# Patient Record
Sex: Female | Born: 1940 | Race: White | Hispanic: No | Smoking: Never smoker
Health system: Southern US, Community
[De-identification: ages and names within clinical notes are randomized; demographics above are authoritative.]

## PROBLEM LIST (undated history)

## (undated) DIAGNOSIS — M069 Rheumatoid arthritis, unspecified: Secondary | ICD-10-CM

## (undated) DIAGNOSIS — K5909 Other constipation: Secondary | ICD-10-CM

## (undated) DIAGNOSIS — Z8719 Personal history of other diseases of the digestive system: Secondary | ICD-10-CM

## (undated) DIAGNOSIS — I4891 Unspecified atrial fibrillation: Secondary | ICD-10-CM

## (undated) DIAGNOSIS — K219 Gastro-esophageal reflux disease without esophagitis: Secondary | ICD-10-CM

## (undated) DIAGNOSIS — K52832 Lymphocytic colitis: Secondary | ICD-10-CM

## (undated) DIAGNOSIS — T7840XA Allergy, unspecified, initial encounter: Secondary | ICD-10-CM

## (undated) DIAGNOSIS — Z973 Presence of spectacles and contact lenses: Secondary | ICD-10-CM

## (undated) DIAGNOSIS — R197 Diarrhea, unspecified: Secondary | ICD-10-CM

## (undated) DIAGNOSIS — D649 Anemia, unspecified: Secondary | ICD-10-CM

## (undated) DIAGNOSIS — E785 Hyperlipidemia, unspecified: Secondary | ICD-10-CM

## (undated) DIAGNOSIS — H269 Unspecified cataract: Secondary | ICD-10-CM

## (undated) DIAGNOSIS — Z9889 Other specified postprocedural states: Secondary | ICD-10-CM

## (undated) HISTORY — DX: Hyperlipidemia, unspecified: E78.5

## (undated) HISTORY — DX: Lymphocytic colitis: K52.832

## (undated) HISTORY — DX: Other specified postprocedural states: Z98.890

## (undated) HISTORY — DX: Rheumatoid arthritis, unspecified: M06.9

## (undated) HISTORY — DX: Allergy, unspecified, initial encounter: T78.40XA

## (undated) HISTORY — DX: Diarrhea, unspecified: R19.7

## (undated) HISTORY — DX: Personal history of other diseases of the digestive system: Z87.19

## (undated) HISTORY — PX: COLONOSCOPY: SHX174

## (undated) HISTORY — DX: Unspecified cataract: H26.9

## (undated) HISTORY — PX: UPPER GASTROINTESTINAL ENDOSCOPY: SHX188

## (undated) HISTORY — PX: TUBAL LIGATION: SHX77

## (undated) HISTORY — DX: Anemia, unspecified: D64.9

## (undated) HISTORY — PX: CATARACT EXTRACTION: SUR2

## (undated) HISTORY — PX: TONSILLECTOMY: SUR1361

## (undated) HISTORY — DX: Unspecified atrial fibrillation: I48.91

## (undated) MED FILL — Finasteride Tab 5 MG: ORAL | Fill #0 | Status: CN

---

## 1997-05-31 HISTORY — PX: ESOPHAGEAL DILATION: SHX303

## 1997-09-21 ENCOUNTER — Encounter: Admission: RE | Admit: 1997-09-21 | Discharge: 1997-09-21 | Payer: Self-pay | Admitting: Family Medicine

## 1997-10-26 ENCOUNTER — Encounter: Admission: RE | Admit: 1997-10-26 | Discharge: 1997-10-26 | Payer: Self-pay | Admitting: Family Medicine

## 1997-11-29 ENCOUNTER — Encounter: Admission: RE | Admit: 1997-11-29 | Discharge: 1997-11-29 | Payer: Self-pay | Admitting: Family Medicine

## 1997-12-24 ENCOUNTER — Encounter: Admission: RE | Admit: 1997-12-24 | Discharge: 1997-12-24 | Payer: Self-pay | Admitting: Family Medicine

## 1998-01-04 ENCOUNTER — Encounter: Admission: RE | Admit: 1998-01-04 | Discharge: 1998-01-04 | Payer: Self-pay | Admitting: Sports Medicine

## 1998-02-25 ENCOUNTER — Encounter: Admission: RE | Admit: 1998-02-25 | Discharge: 1998-02-25 | Payer: Self-pay | Admitting: Family Medicine

## 1998-03-01 ENCOUNTER — Encounter: Admission: RE | Admit: 1998-03-01 | Discharge: 1998-03-01 | Payer: Self-pay | Admitting: Family Medicine

## 1998-03-01 ENCOUNTER — Other Ambulatory Visit: Admission: RE | Admit: 1998-03-01 | Discharge: 1998-03-01 | Payer: Self-pay | Admitting: Family Medicine

## 1998-06-13 ENCOUNTER — Encounter: Admission: RE | Admit: 1998-06-13 | Discharge: 1998-06-13 | Payer: Self-pay | Admitting: Sports Medicine

## 1998-07-13 ENCOUNTER — Encounter: Admission: RE | Admit: 1998-07-13 | Discharge: 1998-07-13 | Payer: Self-pay | Admitting: Family Medicine

## 1998-10-07 ENCOUNTER — Encounter: Admission: RE | Admit: 1998-10-07 | Discharge: 1998-10-07 | Payer: Self-pay | Admitting: Family Medicine

## 1998-11-07 ENCOUNTER — Encounter: Admission: RE | Admit: 1998-11-07 | Discharge: 1998-11-07 | Payer: Self-pay | Admitting: Family Medicine

## 1998-11-21 ENCOUNTER — Encounter: Admission: RE | Admit: 1998-11-21 | Discharge: 1998-11-21 | Payer: Self-pay | Admitting: Family Medicine

## 1999-02-27 ENCOUNTER — Encounter: Admission: RE | Admit: 1999-02-27 | Discharge: 1999-02-27 | Payer: Self-pay | Admitting: Sports Medicine

## 1999-05-30 ENCOUNTER — Encounter: Admission: RE | Admit: 1999-05-30 | Discharge: 1999-05-30 | Payer: Self-pay | Admitting: Family Medicine

## 1999-06-09 ENCOUNTER — Encounter: Admission: RE | Admit: 1999-06-09 | Discharge: 1999-06-09 | Payer: Self-pay | Admitting: Family Medicine

## 1999-08-18 ENCOUNTER — Encounter: Payer: Self-pay | Admitting: Sports Medicine

## 1999-08-18 ENCOUNTER — Encounter: Admission: RE | Admit: 1999-08-18 | Discharge: 1999-08-18 | Payer: Self-pay | Admitting: Gastroenterology

## 1999-10-13 ENCOUNTER — Encounter: Admission: RE | Admit: 1999-10-13 | Discharge: 1999-10-13 | Payer: Self-pay | Admitting: Sports Medicine

## 1999-11-17 ENCOUNTER — Encounter: Admission: RE | Admit: 1999-11-17 | Discharge: 1999-11-17 | Payer: Self-pay | Admitting: Sports Medicine

## 2000-01-26 ENCOUNTER — Encounter: Admission: RE | Admit: 2000-01-26 | Discharge: 2000-01-26 | Payer: Self-pay | Admitting: Family Medicine

## 2000-03-06 ENCOUNTER — Encounter: Admission: RE | Admit: 2000-03-06 | Discharge: 2000-03-06 | Payer: Self-pay | Admitting: Family Medicine

## 2000-05-20 ENCOUNTER — Encounter: Admission: RE | Admit: 2000-05-20 | Discharge: 2000-05-20 | Payer: Self-pay | Admitting: Family Medicine

## 2000-06-11 ENCOUNTER — Encounter: Admission: RE | Admit: 2000-06-11 | Discharge: 2000-06-11 | Payer: Self-pay | Admitting: Family Medicine

## 2000-10-08 ENCOUNTER — Encounter: Admission: RE | Admit: 2000-10-08 | Discharge: 2000-10-08 | Payer: Self-pay | Admitting: Sports Medicine

## 2000-10-08 ENCOUNTER — Encounter: Payer: Self-pay | Admitting: Sports Medicine

## 2000-11-19 ENCOUNTER — Encounter: Admission: RE | Admit: 2000-11-19 | Discharge: 2000-11-19 | Payer: Self-pay | Admitting: Family Medicine

## 2001-09-30 ENCOUNTER — Encounter: Admission: RE | Admit: 2001-09-30 | Discharge: 2001-09-30 | Payer: Self-pay | Admitting: Family Medicine

## 2001-10-03 ENCOUNTER — Encounter: Admission: RE | Admit: 2001-10-03 | Discharge: 2001-10-03 | Payer: Self-pay | Admitting: Sports Medicine

## 2001-10-21 ENCOUNTER — Encounter: Admission: RE | Admit: 2001-10-21 | Discharge: 2001-10-21 | Payer: Self-pay | Admitting: Family Medicine

## 2001-10-22 ENCOUNTER — Encounter: Admission: RE | Admit: 2001-10-22 | Discharge: 2001-10-22 | Payer: Self-pay | Admitting: Family Medicine

## 2001-12-08 ENCOUNTER — Encounter: Payer: Self-pay | Admitting: Sports Medicine

## 2001-12-08 ENCOUNTER — Encounter: Admission: RE | Admit: 2001-12-08 | Discharge: 2001-12-08 | Payer: Self-pay | Admitting: Sports Medicine

## 2002-02-09 ENCOUNTER — Encounter: Admission: RE | Admit: 2002-02-09 | Discharge: 2002-02-09 | Payer: Self-pay | Admitting: Family Medicine

## 2002-04-28 ENCOUNTER — Encounter: Admission: RE | Admit: 2002-04-28 | Discharge: 2002-04-28 | Payer: Self-pay | Admitting: Sports Medicine

## 2002-10-16 ENCOUNTER — Encounter: Admission: RE | Admit: 2002-10-16 | Discharge: 2002-10-16 | Payer: Self-pay | Admitting: Sports Medicine

## 2002-11-11 ENCOUNTER — Ambulatory Visit (HOSPITAL_COMMUNITY): Admission: RE | Admit: 2002-11-11 | Discharge: 2002-11-11 | Payer: Self-pay | Admitting: Gastroenterology

## 2003-02-16 ENCOUNTER — Encounter: Payer: Self-pay | Admitting: Family Medicine

## 2003-02-16 ENCOUNTER — Ambulatory Visit (HOSPITAL_COMMUNITY): Admission: RE | Admit: 2003-02-16 | Discharge: 2003-02-16 | Payer: Self-pay | Admitting: Family Medicine

## 2003-06-29 ENCOUNTER — Encounter: Admission: RE | Admit: 2003-06-29 | Discharge: 2003-06-29 | Payer: Self-pay | Admitting: Family Medicine

## 2003-06-29 ENCOUNTER — Encounter (INDEPENDENT_AMBULATORY_CARE_PROVIDER_SITE_OTHER): Payer: Self-pay | Admitting: *Deleted

## 2003-06-29 ENCOUNTER — Ambulatory Visit (HOSPITAL_COMMUNITY): Admission: RE | Admit: 2003-06-29 | Discharge: 2003-06-29 | Payer: Self-pay | Admitting: Family Medicine

## 2003-09-22 ENCOUNTER — Encounter: Admission: RE | Admit: 2003-09-22 | Discharge: 2003-09-22 | Payer: Self-pay | Admitting: Sports Medicine

## 2003-11-11 ENCOUNTER — Encounter: Admission: RE | Admit: 2003-11-11 | Discharge: 2003-11-11 | Payer: Self-pay | Admitting: Sports Medicine

## 2004-02-15 ENCOUNTER — Ambulatory Visit: Payer: Self-pay | Admitting: Family Medicine

## 2004-03-07 ENCOUNTER — Ambulatory Visit (HOSPITAL_COMMUNITY): Admission: RE | Admit: 2004-03-07 | Discharge: 2004-03-07 | Payer: Self-pay | Admitting: Sports Medicine

## 2004-06-12 ENCOUNTER — Ambulatory Visit: Payer: Self-pay | Admitting: Family Medicine

## 2004-06-14 ENCOUNTER — Ambulatory Visit: Payer: Self-pay | Admitting: Family Medicine

## 2004-07-14 ENCOUNTER — Ambulatory Visit: Payer: Self-pay | Admitting: Family Medicine

## 2004-08-15 ENCOUNTER — Ambulatory Visit: Payer: Self-pay | Admitting: Family Medicine

## 2004-08-21 ENCOUNTER — Encounter: Admission: RE | Admit: 2004-08-21 | Discharge: 2004-08-21 | Payer: Self-pay | Admitting: Family Medicine

## 2005-01-23 ENCOUNTER — Ambulatory Visit: Payer: Self-pay | Admitting: Sports Medicine

## 2005-01-26 ENCOUNTER — Ambulatory Visit: Payer: Self-pay | Admitting: Sports Medicine

## 2005-02-16 ENCOUNTER — Ambulatory Visit: Payer: Self-pay | Admitting: Family Medicine

## 2005-03-13 ENCOUNTER — Ambulatory Visit (HOSPITAL_COMMUNITY): Admission: RE | Admit: 2005-03-13 | Discharge: 2005-03-13 | Payer: Self-pay | Admitting: Family Medicine

## 2005-03-20 ENCOUNTER — Ambulatory Visit: Payer: Self-pay | Admitting: Family Medicine

## 2005-09-25 ENCOUNTER — Ambulatory Visit: Payer: Self-pay | Admitting: Family Medicine

## 2005-09-25 ENCOUNTER — Encounter: Payer: Self-pay | Admitting: Family Medicine

## 2005-09-28 ENCOUNTER — Encounter (INDEPENDENT_AMBULATORY_CARE_PROVIDER_SITE_OTHER): Payer: Self-pay | Admitting: *Deleted

## 2005-09-28 LAB — CONVERTED CEMR LAB

## 2005-10-02 ENCOUNTER — Ambulatory Visit: Payer: Self-pay | Admitting: Family Medicine

## 2006-02-08 ENCOUNTER — Ambulatory Visit: Payer: Self-pay | Admitting: Family Medicine

## 2006-03-27 ENCOUNTER — Ambulatory Visit (HOSPITAL_COMMUNITY): Admission: RE | Admit: 2006-03-27 | Discharge: 2006-03-27 | Payer: Self-pay | Admitting: Sports Medicine

## 2006-05-21 ENCOUNTER — Ambulatory Visit: Payer: Self-pay | Admitting: Sports Medicine

## 2006-05-28 ENCOUNTER — Ambulatory Visit: Payer: Self-pay | Admitting: Family Medicine

## 2006-06-06 ENCOUNTER — Ambulatory Visit: Payer: Self-pay | Admitting: Family Medicine

## 2006-06-27 DIAGNOSIS — M1990 Unspecified osteoarthritis, unspecified site: Secondary | ICD-10-CM

## 2006-06-27 DIAGNOSIS — E785 Hyperlipidemia, unspecified: Secondary | ICD-10-CM | POA: Insufficient documentation

## 2006-06-27 DIAGNOSIS — K219 Gastro-esophageal reflux disease without esophagitis: Secondary | ICD-10-CM

## 2006-06-28 ENCOUNTER — Encounter (INDEPENDENT_AMBULATORY_CARE_PROVIDER_SITE_OTHER): Payer: Self-pay | Admitting: *Deleted

## 2006-08-08 ENCOUNTER — Telehealth: Payer: Self-pay | Admitting: *Deleted

## 2006-08-27 ENCOUNTER — Ambulatory Visit: Payer: Self-pay | Admitting: Sports Medicine

## 2006-08-27 DIAGNOSIS — M79609 Pain in unspecified limb: Secondary | ICD-10-CM

## 2006-08-27 DIAGNOSIS — M7742 Metatarsalgia, left foot: Secondary | ICD-10-CM

## 2006-08-27 DIAGNOSIS — M7741 Metatarsalgia, right foot: Secondary | ICD-10-CM

## 2006-08-27 DIAGNOSIS — M722 Plantar fascial fibromatosis: Secondary | ICD-10-CM

## 2006-10-02 ENCOUNTER — Ambulatory Visit: Payer: Self-pay | Admitting: Sports Medicine

## 2006-10-25 ENCOUNTER — Telehealth: Payer: Self-pay | Admitting: Family Medicine

## 2006-11-26 ENCOUNTER — Ambulatory Visit: Payer: Self-pay | Admitting: Family Medicine

## 2006-11-26 DIAGNOSIS — M25539 Pain in unspecified wrist: Secondary | ICD-10-CM

## 2006-11-26 LAB — CONVERTED CEMR LAB
ALT: 11 units/L (ref 0–35)
AST: 14 units/L (ref 0–37)
Albumin: 4.3 g/dL (ref 3.5–5.2)
Alkaline Phosphatase: 56 units/L (ref 39–117)
BUN: 14 mg/dL (ref 6–23)
CO2: 25 meq/L (ref 19–32)
Calcium: 8.9 mg/dL (ref 8.4–10.5)
Chloride: 107 meq/L (ref 96–112)
Cholesterol: 248 mg/dL — ABNORMAL HIGH (ref 0–200)
Creatinine, Ser: 0.64 mg/dL (ref 0.40–1.20)
Glucose, Bld: 93 mg/dL (ref 70–99)
HCT: 42.9 % (ref 36.0–46.0)
HDL: 108 mg/dL (ref >39)
Hemoglobin: 13.7 g/dL (ref 12.0–15.0)
LDL Cholesterol: 129 mg/dL — ABNORMAL HIGH (ref 0–99)
MCHC: 31.9 g/dL (ref 30.0–36.0)
MCV: 102.4 fL — ABNORMAL HIGH (ref 78.0–100.0)
Platelets: 271 10*3/uL (ref 150–400)
Potassium: 4.7 meq/L (ref 3.5–5.3)
RBC: 4.19 M/uL (ref 3.87–5.11)
RDW: 13.8 % (ref 11.5–14.0)
Sodium: 142 meq/L (ref 135–145)
Total Bilirubin: 0.5 mg/dL (ref 0.3–1.2)
Total CHOL/HDL Ratio: 2.3
Total Protein: 6.8 g/dL (ref 6.0–8.3)
Triglycerides: 55 mg/dL (ref <150)
VLDL: 11 mg/dL (ref 0–40)
WBC: 5.1 10*3/uL (ref 4.0–10.5)

## 2006-12-02 ENCOUNTER — Encounter: Payer: Self-pay | Admitting: Family Medicine

## 2007-01-01 ENCOUNTER — Telehealth: Payer: Self-pay | Admitting: Family Medicine

## 2007-02-12 ENCOUNTER — Ambulatory Visit: Payer: Self-pay | Admitting: Family Medicine

## 2007-05-01 HISTORY — PX: DILATION AND CURETTAGE OF UTERUS: SHX78

## 2007-06-03 ENCOUNTER — Ambulatory Visit (HOSPITAL_COMMUNITY): Admission: RE | Admit: 2007-06-03 | Discharge: 2007-06-03 | Payer: Self-pay | Admitting: Family Medicine

## 2007-09-02 ENCOUNTER — Telehealth: Payer: Self-pay | Admitting: Family Medicine

## 2007-09-29 ENCOUNTER — Telehealth: Payer: Self-pay | Admitting: *Deleted

## 2007-09-30 ENCOUNTER — Ambulatory Visit: Payer: Self-pay | Admitting: Family Medicine

## 2007-09-30 ENCOUNTER — Encounter: Payer: Self-pay | Admitting: Family Medicine

## 2007-09-30 ENCOUNTER — Telehealth: Payer: Self-pay | Admitting: Family Medicine

## 2007-09-30 DIAGNOSIS — N950 Postmenopausal bleeding: Secondary | ICD-10-CM

## 2007-11-04 ENCOUNTER — Encounter (INDEPENDENT_AMBULATORY_CARE_PROVIDER_SITE_OTHER): Payer: Self-pay | Admitting: Obstetrics and Gynecology

## 2007-11-04 ENCOUNTER — Ambulatory Visit (HOSPITAL_COMMUNITY): Admission: RE | Admit: 2007-11-04 | Discharge: 2007-11-04 | Payer: Self-pay | Admitting: Obstetrics and Gynecology

## 2007-11-06 ENCOUNTER — Telehealth: Payer: Self-pay | Admitting: *Deleted

## 2007-11-11 ENCOUNTER — Ambulatory Visit: Payer: Self-pay | Admitting: Family Medicine

## 2007-11-11 DIAGNOSIS — Z9189 Other specified personal risk factors, not elsewhere classified: Secondary | ICD-10-CM

## 2007-11-11 LAB — CONVERTED CEMR LAB
ALT: 12 units/L (ref 0–35)
AST: 11 units/L (ref 0–37)
Albumin: 4.3 g/dL (ref 3.5–5.2)
Alkaline Phosphatase: 46 units/L (ref 39–117)
BUN: 11 mg/dL (ref 6–23)
CO2: 24 meq/L (ref 19–32)
Calcium: 9.5 mg/dL (ref 8.4–10.5)
Chloride: 104 meq/L (ref 96–112)
Creatinine, Ser: 0.65 mg/dL (ref 0.40–1.20)
Direct LDL: 149 mg/dL — ABNORMAL HIGH
Glucose, Bld: 89 mg/dL (ref 70–99)
HCT: 44.2 % (ref 36.0–46.0)
Hemoglobin: 14.2 g/dL (ref 12.0–15.0)
MCHC: 32.1 g/dL (ref 30.0–36.0)
MCV: 102.1 fL — ABNORMAL HIGH (ref 78.0–100.0)
Platelets: 277 10*3/uL (ref 150–400)
Potassium: 4.2 meq/L (ref 3.5–5.3)
RBC: 4.33 M/uL (ref 3.87–5.11)
RDW: 13.2 % (ref 11.5–15.5)
Sodium: 141 meq/L (ref 135–145)
Total Bilirubin: 0.5 mg/dL (ref 0.3–1.2)
Total Protein: 7.1 g/dL (ref 6.0–8.3)
Vit D, 1,25-Dihydroxy: 62 (ref 30–89)
WBC: 6.3 10*3/uL (ref 4.0–10.5)

## 2007-11-13 ENCOUNTER — Encounter: Payer: Self-pay | Admitting: Family Medicine

## 2007-11-20 ENCOUNTER — Encounter: Payer: Self-pay | Admitting: Family Medicine

## 2008-01-28 ENCOUNTER — Ambulatory Visit: Payer: Self-pay | Admitting: Family Medicine

## 2008-01-30 ENCOUNTER — Telehealth: Payer: Self-pay | Admitting: *Deleted

## 2008-01-30 ENCOUNTER — Ambulatory Visit: Payer: Self-pay | Admitting: Family Medicine

## 2008-02-05 ENCOUNTER — Ambulatory Visit: Payer: Self-pay | Admitting: Sports Medicine

## 2008-06-07 ENCOUNTER — Ambulatory Visit (HOSPITAL_COMMUNITY): Admission: RE | Admit: 2008-06-07 | Discharge: 2008-06-07 | Payer: Self-pay | Admitting: Family Medicine

## 2008-09-10 ENCOUNTER — Telehealth: Payer: Self-pay | Admitting: Family Medicine

## 2008-10-11 ENCOUNTER — Encounter: Payer: Self-pay | Admitting: Family Medicine

## 2008-10-29 ENCOUNTER — Telehealth: Payer: Self-pay | Admitting: Family Medicine

## 2008-11-23 ENCOUNTER — Ambulatory Visit: Payer: Self-pay | Admitting: Family Medicine

## 2008-11-23 DIAGNOSIS — J309 Allergic rhinitis, unspecified: Secondary | ICD-10-CM | POA: Insufficient documentation

## 2008-11-23 DIAGNOSIS — K5909 Other constipation: Secondary | ICD-10-CM

## 2008-11-23 DIAGNOSIS — L719 Rosacea, unspecified: Secondary | ICD-10-CM

## 2008-11-25 ENCOUNTER — Ambulatory Visit: Payer: Self-pay | Admitting: Family Medicine

## 2008-11-25 ENCOUNTER — Encounter: Payer: Self-pay | Admitting: Family Medicine

## 2008-11-25 LAB — CONVERTED CEMR LAB
Cholesterol: 250 mg/dL — ABNORMAL HIGH (ref 0–200)
HDL: 110 mg/dL (ref >39)
LDL Cholesterol: 123 mg/dL — ABNORMAL HIGH (ref 0–99)
Total CHOL/HDL Ratio: 2.3
Triglycerides: 86 mg/dL (ref <150)
VLDL: 17 mg/dL (ref 0–40)

## 2008-11-26 ENCOUNTER — Encounter: Payer: Self-pay | Admitting: Family Medicine

## 2008-12-17 ENCOUNTER — Encounter (INDEPENDENT_AMBULATORY_CARE_PROVIDER_SITE_OTHER): Payer: Self-pay | Admitting: *Deleted

## 2008-12-20 ENCOUNTER — Ambulatory Visit: Payer: Self-pay | Admitting: Sports Medicine

## 2009-02-15 ENCOUNTER — Ambulatory Visit: Payer: Self-pay | Admitting: Family Medicine

## 2009-03-07 ENCOUNTER — Ambulatory Visit: Payer: Self-pay | Admitting: Sports Medicine

## 2009-03-18 ENCOUNTER — Encounter: Payer: Self-pay | Admitting: Family Medicine

## 2009-04-19 ENCOUNTER — Encounter (INDEPENDENT_AMBULATORY_CARE_PROVIDER_SITE_OTHER): Payer: Self-pay | Admitting: *Deleted

## 2009-06-07 ENCOUNTER — Ambulatory Visit (HOSPITAL_COMMUNITY): Admission: RE | Admit: 2009-06-07 | Discharge: 2009-06-07 | Payer: Self-pay | Admitting: Family Medicine

## 2009-09-30 ENCOUNTER — Telehealth: Payer: Self-pay | Admitting: Family Medicine

## 2009-10-03 ENCOUNTER — Ambulatory Visit: Payer: Self-pay | Admitting: Family Medicine

## 2009-10-06 ENCOUNTER — Telehealth: Payer: Self-pay | Admitting: Family Medicine

## 2009-10-07 ENCOUNTER — Ambulatory Visit: Payer: Self-pay | Admitting: Family Medicine

## 2009-10-07 DIAGNOSIS — J209 Acute bronchitis, unspecified: Secondary | ICD-10-CM

## 2010-02-21 ENCOUNTER — Ambulatory Visit: Payer: Self-pay | Admitting: Family Medicine

## 2010-03-30 ENCOUNTER — Ambulatory Visit: Payer: Self-pay | Admitting: Sports Medicine

## 2010-04-21 ENCOUNTER — Encounter: Payer: Self-pay | Admitting: Family Medicine

## 2010-05-18 ENCOUNTER — Other Ambulatory Visit: Payer: Self-pay | Admitting: Family Medicine

## 2010-05-18 DIAGNOSIS — Z1231 Encounter for screening mammogram for malignant neoplasm of breast: Secondary | ICD-10-CM

## 2010-05-18 DIAGNOSIS — Z1239 Encounter for other screening for malignant neoplasm of breast: Secondary | ICD-10-CM

## 2010-05-30 NOTE — Progress Notes (Signed)
Summary: triage  Phone Note Call from Patient Call back at Home Phone [phone]   Caller: Patient Summary of Call: Pt still sick and feels like it has turned into bronchitis.  Has taken antibotic for 3 days now. Initial call taken by: Raymond Gurney,  October 06, 2009 9:26 AM  Follow-up for Phone Call        she is on day 3 of azithromycin. states she fels worse. breathing ok but bad cough. has been using her husband's Cherratussin while helps a lot. wants that called in. appt made for 8:30 tomorrow am as she does not think she can get here today  uses [facility] [address] [employer] if md will call it in. she is almost out  Follow-up by: Elige Radon RN,  October 06, 2009 10:00 AM    New/Updated Medications: CHERATUSSIN AC 100-10 MG/5ML SYRP (GUAIFENESIN-CODEINE) Take 1-2 tsp every 4 hr as needed cough Prescriptions: CHERATUSSIN AC 100-10 MG/5ML SYRP (GUAIFENESIN-CODEINE) Take 1-2 tsp every 4 hr as needed cough  #4 fl oz x 2   Entered and Authorized by:   Candelaria Celeste MD   Signed by:   Candelaria Celeste MD on 10/06/2009   Method used:   Printed then faxed to ...       [facility] (retail)       [address]       [place], [place]  [address]       Ph: [phone] or [phone]       Fax: [phone]   RxID:   [id number]

## 2010-05-30 NOTE — Assessment & Plan Note (Signed)
Summary: cold symptoms/[place]/hale   Vital Signs:  Patient profile:   70 year old female Menstrual status:  postmenopausal Weight:      131.6 pounds Temp:     98.6 degrees F oral Pulse rate:   58 / minute BP sitting:   112 / 66  (right arm)  Vitals Entered By: Gerrit Heck slade,cma CC: cold symptoms x 3 days. started out with chills on Saturday. Is Patient Diabetic? No Pain Assessment Patient in pain? no        Primary Care Provider:  Candelaria Celeste MD  CC:  cold symptoms x 3 days. started out with chills on Saturday.Marland Kitchen  History of Present Illness: Started with cold symptoms on Thursday - sore throat and head congstion.  Called Friday for an appt but there weren't any left.  Thought she waas feeling better on Saturday but then SUnday felt body aches, chilled, subjective fever (didn't measure) and developed cough.  Husband recently treated for bronchitis (had 45 pack year history).  Ms. Palla has never smoked.  Thinks she has the same thing her husband had.  Has been using his cough medicine and it has helped.  More was called in for him on Friday by his PCP.   Habits & Providers  Alcohol-Tobacco-Diet     Tobacco Status: never  Allergies: No Known Drug Allergies  Physical Exam  General:  thinn, alert, NAD vitals reviewed.  afebrile Eyes:  conjunctiva clear and moist Ears:  External ear exam shows no significant lesions or deformities.  Otoscopic examination reveals clear canals, tympanic membranes are intact bilaterally without bulging, retraction, inflammation or discharge. Hearing is grossly normal bilaterally. Nose:  External nasal examination shows no deformity or inflammation. Nasal mucosa are pink and moist without lesions or exudates. Mouth:  Oral mucosa and oropharynx without lesions or exudates.  Teeth in good repair. Lungs:  Normal respiratory effort, chest expands symmetrically. Lungs are clear to auscultation, no crackles or wheezes. Heart:  Normal rate and regular  rhythm. S1 and S2 normal without gallop, murmur, click, rub or other extra sounds.   Impression & Recommendations:  Problem # 1:  UPPER RESPIRATORY INFECTION, ACUTE (ICD-465.9) Assessment New  Likely viral.  Patient desire antibiotic.  Discussed that likely viral and her husband needed antibiotic more related to his smoking history/COPD.  However, did give zpak paper Rx for patietn to take if she is not feeling better in 2-3 days.  Patient states she will wait to see if she gets better on her own over the next few days.  Her updated medication list for this problem includes:    Aspirin 81 Mg Tabs (Aspirin) .Marland Kitchen... Take one tablet daily    Loratadine 10 Mg Tabs (Loratadine) .Marland Kitchen... Take one tab daily as needed sneezing  Orders: [place]- Est Level  3 ([id number])  Complete Medication List: 1)  Estring 2 Mg Ring (Estradiol) .... Insert every 2 months 2)  Medroxyprogesterone Acetate 2.5 Mg Tabs (Medroxyprogesterone acetate) .... Take one tablet daily 3)  Omeprazole 20 Mg Cpdr (Omeprazole) .... Take 1 capsule by mouth two times a day 4)  Tums E-x 750 Mg Chew (Calcium carbonate antacid) .... Take 2 tablet by mouth once a day 5)  Glucosamine-chondroitin 500-400 Mg Caps (Glucosamine-chondroitin) .... Take one tab three times a day 6)  Gnp Fish Oil 1200 Mg Caps (Omega-3 fatty acids) .... Take one daily 7)  Vitamin D 1000 Unit Tabs (Cholecalciferol) .... Take one tablet daily 8)  Aspirin 81 Mg Tabs (Aspirin) .... Take one  tablet daily 9)  Centrum Silver Tabs (Multiple vitamins-minerals) .... Take one tablet daily 10)  Metoclopramide Hcl 10 Mg Tabs (Metoclopramide hcl) .... Take one tab 4 times daily as needed for nausea 11)  Oracea 40 Mg Cpdr (Doxycycline (rosacea)) .... One cap 4 x weekly 12)  Phazyme 125 Mg Chew (Simethicone) .... 3-4 tabs daily as needed 13)  Loratadine 10 Mg Tabs (Loratadine) .... Take one tab daily as needed sneezing 14)  Azithromycin 250 Mg Tabs (Azithromycin) .... 2 tabs by mouth on  day 1 then 1 tab daily for 4 days thereafter  Patient Instructions: 1)  It is okay to use the cough medicine called in for your husband on Friday. 2)  I think you just have a virus, which will get better on its and is not killed by antibiotics.  I would expect you to start feeling better in the next 2-3 days.  A cough can linger for several weeks sometimes, though. 3)  However, if you still are feeling bad 2-3 days from now, go ahead and fill the antibiotic prescription.  Be sure to finish the full course once you start it. 4)  It was nice to meet you today! Prescriptions: AZITHROMYCIN 250 MG TABS (AZITHROMYCIN) 2 tabs by mouth on day 1 then 1 tab daily for 4 days thereafter  #6 x 0   Entered and Authorized by:   Orland Mustard  MD   Signed by:   Orland Mustard  MD on 10/03/2009   Method used:   Print then Give to Patient   RxID:   [id number]

## 2010-05-30 NOTE — Assessment & Plan Note (Signed)
Summary: acute bronchitis- overall slowly improving   Vital Signs:  Patient profile:   69 year old female Menstrual status:  postmenopausal Height:      64 inches Weight:      130.8 pounds BMI:     22.53 O2 Sat:      94 % on Room air Temp:     97.5 degrees F oral Pulse rate:   57 / minute Resp:     16 per minute BP sitting:   125 / 75  (left arm) Cuff size:   regular  Vitals Entered By: Isabelle Course (October 07, 2009 8:34 AM)  O2 Flow:  Room air CC: C/O bad cough worse Is Patient Diabetic? No Pain Assessment Patient in pain? no      Comments filled antiboitics Rx she recieved on 6/6 from dr Nadara Eaton on 6/7 and started taking them on 6/7   Primary Care Provider:  Candelaria Celeste MD  CC:  C/O bad cough worse.  History of Present Illness: 70yo F here to f/u on URI  URI: x 1 week.  Was seen on 6/6 by Dr. Nadara Eaton and prescribed Azith which she started on 6/7.  Reports no improvement on abx and called back yesterday and was prescribed cheratussin.  She has been using her husband's cheratussin and reports improvement of cough.  Denies any objective fevers.  Cough is productive (nonbloody).  Sinus and congestion symptoms improved.    Current Medications (verified): 1)  Estring 2 Mg Ring (Estradiol) .... Insert Every 2 Months 2)  Medroxyprogesterone Acetate 2.5 Mg Tabs (Medroxyprogesterone Acetate) .... Take One Tablet Daily 3)  Omeprazole 20 Mg Cpdr (Omeprazole) .... Take 1 Capsule By Mouth Two Times A Day 4)  Tums E-X 750 Mg Chew (Calcium Carbonate Antacid) .... Take 2 Tablet By Mouth Once A Day 5)  Glucosamine-Chondroitin 500-400 Mg Caps (Glucosamine-Chondroitin) .... Take One Tab Three Times A Day 6)  Gnp Fish Oil 1200 Mg Caps (Omega-3 Fatty Acids) .... Take One Daily 7)  Vitamin D 1000 Unit  Tabs (Cholecalciferol) .... Take One Tablet Daily 8)  Aspirin 81 Mg  Tabs (Aspirin) .... Take One Tablet Daily 9)  Centrum Silver   Tabs (Multiple Vitamins-Minerals) .... Take One Tablet  Daily 10)  Metoclopramide Hcl 10 Mg Tabs (Metoclopramide Hcl) .... Take One Tab 4 Times Daily As Needed For Nausea 11)  Oracea 40 Mg Cpdr (Doxycycline (Rosacea)) .... One Cap 4 X Weekly 12)  Phazyme 125 Mg Chew (Simethicone) .... 3-4 Tabs Daily As Needed 13)  Loratadine 10 Mg Tabs (Loratadine) .... Take One Tab Daily As Needed Sneezing 14)  Azithromycin 250 Mg Tabs (Azithromycin) .... 2 Tabs By Mouth On Day 1 Then 1 Tab Daily For 4 Days Thereafter 15)  Cheratussin Ac 100-10 Mg/69m Syrp (Guaifenesin-Codeine) .... Take 1-2 Tsp Every 4 Hr As Needed Cough  Allergies (verified): No Known Drug Allergies  Review of Systems      See HPI  Physical Exam  General:  VS Reviewed. Well appearing, NAD.  Eyes:  no injected conjuntiva Mouth:  Oral mucosa and oropharynx without lesions or exudates.  Teeth in good repair. Neck:  supple, full ROM, no goiter or mass  Lungs:  Moving air throughout but coarseness in the lower lung fields with deep inspiration and forced expiration  Heart:  Normal rate and regular rhythm. S1 and S2 normal without gallop, murmur, click, rub or other extra sounds. Cervical Nodes:  No lymphadenopathy noted   Impression & Recommendations:  Problem #  1:  ACUTE BRONCHITIS (ICD-466.0) Assessment Improved Hx and exam still c/w acute bronchitis most likely viral proven by the lack of improvement while on the Azith.   Since she has almost completed the medication, advised to finish. Continue on Cheratussin for cough suppression. Overall, she is improving. However, she did have coarse BS with O2 sat of 94% on RA despite looking so well.  Therefore, advised to obtain an xray if she developed worsening symptoms or fever.  Otherwise she is to f/u if not better by next Thurs.   Her updated medication list for this problem includes:    Azithromycin 250 Mg Tabs (Azithromycin) .Marland Kitchen... 2 tabs by mouth on day 1 then 1 tab daily for 4 days thereafter    Cheratussin Ac 100-10 Mg/22m Syrp  (Guaifenesin-codeine) ..Marland Kitchen.. Take 1-2 tsp every 4 hr as needed cough  Orders: Radiology other (Radiology Other) [facility] Est Level  3 (([id number]  Complete Medication List: 1)  Estring 2 Mg Ring (Estradiol) .... Insert every 2 months 2)  Medroxyprogesterone Acetate 2.5 Mg Tabs (Medroxyprogesterone acetate) .... Take one tablet daily 3)  Omeprazole 20 Mg Cpdr (Omeprazole) .... Take 1 capsule by mouth two times a day 4)  Tums E-x 750 Mg Chew (Calcium carbonate antacid) .... Take 2 tablet by mouth once a day 5)  Glucosamine-chondroitin 500-400 Mg Caps (Glucosamine-chondroitin) .... Take one tab three times a day 6)  Gnp Fish Oil 1200 Mg Caps (Omega-3 fatty acids) .... Take one daily 7)  Vitamin D 1000 Unit Tabs (Cholecalciferol) .... Take one tablet daily 8)  Aspirin 81 Mg Tabs (Aspirin) .... Take one tablet daily 9)  Centrum Silver Tabs (Multiple vitamins-minerals) .... Take one tablet daily 10)  Metoclopramide Hcl 10 Mg Tabs (Metoclopramide hcl) .... Take one tab 4 times daily as needed for nausea 11)  Oracea 40 Mg Cpdr (Doxycycline (rosacea)) .... One cap 4 x weekly 12)  Phazyme 125 Mg Chew (Simethicone) .... 3-4 tabs daily as needed 13)  Loratadine 10 Mg Tabs (Loratadine) .... Take one tab daily as needed sneezing 14)  Azithromycin 250 Mg Tabs (Azithromycin) .... 2 tabs by mouth on day 1 then 1 tab daily for 4 days thereafter 15)  Cheratussin Ac 100-10 Mg/573mSyrp (Guaifenesin-codeine) .... Take 1-2 tsp every 4 hr as needed cough  Patient Instructions: 1)  Complete the azithromycin antibiotic 2)  Continue with the Cheratussin as needed. 3)  If no improvement over the weekend, go get the xray and I'll contact you with the results. 4)  If no improvement by next Thursday, please return for re-evaluation

## 2010-05-30 NOTE — Assessment & Plan Note (Signed)
Summary: flu shot,tcb  Nurse Visit   Vital Signs:  Patient profile:   70 year old female Menstrual status:  postmenopausal Temp:     97.9 degrees F  Vitals Entered By: Marcell Barlow RN (February 21, 2010 1:32 PM)  Allergies: No Known Drug Allergies  Immunizations Administered:  Influenza Vaccine # 1:    Vaccine Type: Fluvax MCR    Site: left deltoid    Mfr: GlaxoSmithKline    Dose: 0.5 ml    Route: IM    Given by: Marcell Barlow RN    Exp. Date: 10/25/2010    Lot #: [id number]    VIS given: 11/22/09 version given February 21, 2010.  Flu Vaccine Consent Questions:    Do you have a history of severe allergic reactions to this vaccine? no    Any prior history of allergic reactions to egg and/or gelatin? no    Do you have a sensitivity to the preservative Thimersol? no    Do you have a past history of Guillan-Barre Syndrome? no    Do you currently have an acute febrile illness? no    Have you ever had a severe reaction to latex? no    Vaccine information given and explained to patient? yes    Are you currently pregnant? no  Orders Added: 1)  Influenza Vaccine MCR [00025] 2)  Administration Flu vaccine - MCR [F0071]

## 2010-05-30 NOTE — Progress Notes (Signed)
Summary: triage  Phone Note Call from Patient Call back at Home Phone [phone]   Caller: Patient Summary of Call: congestion/cold-  same kind of cold that husband had and his turned into bronchitis Initial call taken by: Audie Clear,  September 30, 2009 3:30 PM  Follow-up for Phone Call        feels "crummy". thinks she is going to get what spouse has. no appt left for today. appt made for mondat at her request. she will [place] [facility] if she gets worse over the weekend Follow-up by: Elige Radon RN,  September 30, 2009 3:42 PM  Additional Follow-up for Phone Call Additional follow up Details #1::        pt would like to talk w/ dr Walker Kehr Additional Follow-up by: Audie Clear,  October 04, 2009 9:08 AM

## 2010-05-30 NOTE — Assessment & Plan Note (Signed)
Summary: FEET PAIN,MC   Vital Signs:  Patient profile:   70 year old female Menstrual status:  postmenopausal Pulse rate:   62 / minute BP sitting:   107 / 56  (right arm)  Vitals Entered By: Caesar Chestnut RN (March 30, 2010 3:55 PM) CC: bilat foot pain- plantar aspect    Primary Provider:  Candelaria Celeste MD  CC:  bilat foot pain- plantar aspect .  History of Present Illness: 70 yo female here for fu of her foot pain.  Her pain is chronic, she has a collapsed transverse arch, she has had 3 sets of custom orthotics made with minimal padding and only MT pads.  Symptoms are greatly improved with MT pads.  She is here with 3 sets of shoes that she cannot fit the orthotics in comfortably.  She would like MT pads place in the shoes and would like new MT pads on her custom orthotics.  All her orthotics are in overall good shape.  She does not use her new thin custom orthotics as they are just on comfortable to her.  She is also having some shoulder pain that she is willing to discuss at another visit.  Preventive Screening-Counseling & Management  Alcohol-Tobacco     Smoking Status: never  Current Medications (verified): 1)  Estring 2 Mg Ring (Estradiol) .... Insert Every 2 Months 2)  Medroxyprogesterone Acetate 2.5 Mg Tabs (Medroxyprogesterone Acetate) .... Take One Tablet Daily 3)  Omeprazole 20 Mg Cpdr (Omeprazole) .... Take 1 Capsule By Mouth Two Times A Day 4)  Tums E-X 750 Mg Chew (Calcium Carbonate Antacid) .... Take 2 Tablet By Mouth Once A Day 5)  Glucosamine-Chondroitin 500-400 Mg Caps (Glucosamine-Chondroitin) .... Take One Tab Three Times A Day 6)  Gnp Fish Oil 1200 Mg Caps (Omega-3 Fatty Acids) .... Take One Daily 7)  Vitamin D 1000 Unit  Tabs (Cholecalciferol) .... Take One Tablet Daily 8)  Aspirin 81 Mg  Tabs (Aspirin) .... Take One Tablet Daily 9)  Centrum Silver   Tabs (Multiple Vitamins-Minerals) .... Take One Tablet Daily 10)  Metoclopramide Hcl 10 Mg Tabs  (Metoclopramide Hcl) .... Take One Tab 4 Times Daily As Needed For Nausea 11)  Oracea 40 Mg Cpdr (Doxycycline (Rosacea)) .... One Cap 4 X Weekly 12)  Phazyme 125 Mg Chew (Simethicone) .... 3-4 Tabs Daily As Needed 13)  Loratadine 10 Mg Tabs (Loratadine) .... Take One Tab Daily As Needed Sneezing 14)  Azithromycin 250 Mg Tabs (Azithromycin) .... 2 Tabs By Mouth On Day 1 Then 1 Tab Daily For 4 Days Thereafter 15)  Cheratussin Ac 100-10 Mg/6m Syrp (Guaifenesin-Codeine) .... Take 1-2 Tsp Every 4 Hr As Needed Cough  Allergies (verified): No Known Drug Allergies  Review of Systems       See HPI  Physical Exam  General:  Well-developed,well-nourished,in no acute distress; alert,appropriate and cooperative throughout examination Msk:   trnasverse arech breakdown and splaying of toes bilat Lt > RT abnormal MT callusing preserved long arch bilat small bunionettes    Impression & Recommendations:  Problem # 1:  FOOT PAIN, BILATERAL (ICD-729.5) Assessment Improved  much improved in orthotics and with MT pads placed in her shoes. She feels much better with MT pads placed in her heels without an additional insole.  Orders: Foot Orthosis ( Arch Strap/Heel Cup) [phone]  Problem # 2:  METATARSALGIA (ICD-726.70) Assessment: Improved  Placed sets of MT pads in her heels, 2 other sets of shoes, 3/4 length in soles, 3 additional  sets of MT pads given to pt to place in her other shoes. Symptoms greatly improved in all shoes with MT pads in place.  Pt very satisfied.  Orders: Foot Orthosis ( Arch Strap/Heel Cup) [phone])  Complete Medication List: 1)  Estring 2 Mg Ring (Estradiol) .... Insert every 2 months 2)  Medroxyprogesterone Acetate 2.5 Mg Tabs (Medroxyprogesterone acetate) .... Take one tablet daily 3)  Omeprazole 20 Mg Cpdr (Omeprazole) .... Take 1 capsule by mouth two times a day 4)  Tums E-x 750 Mg Chew (Calcium carbonate antacid) .... Take 2 tablet by mouth once a day 5)   Glucosamine-chondroitin 500-400 Mg Caps (Glucosamine-chondroitin) .... Take one tab three times a day 6)  Gnp Fish Oil 1200 Mg Caps (Omega-3 fatty acids) .... Take one daily 7)  Vitamin D 1000 Unit Tabs (Cholecalciferol) .... Take one tablet daily 8)  Aspirin 81 Mg Tabs (Aspirin) .... Take one tablet daily 9)  Centrum Silver Tabs (Multiple vitamins-minerals) .... Take one tablet daily 10)  Metoclopramide Hcl 10 Mg Tabs (Metoclopramide hcl) .... Take one tab 4 times daily as needed for nausea 11)  Oracea 40 Mg Cpdr (Doxycycline (rosacea)) .... One cap 4 x weekly 12)  Phazyme 125 Mg Chew (Simethicone) .... 3-4 tabs daily as needed 13)  Loratadine 10 Mg Tabs (Loratadine) .... Take one tab daily as needed sneezing 14)  Azithromycin 250 Mg Tabs (Azithromycin) .... 2 tabs by mouth on day 1 then 1 tab daily for 4 days thereafter 15)  Cheratussin Ac 100-10 Mg/63m Syrp (Guaifenesin-codeine) .... Take 1-2 tsp every 4 hr as needed cough   Orders Added: 1)  Est. Patient Level III [[87564]2)  Foot Orthosis ( Arch Strap/Heel Cup) [[P3295]

## 2010-05-30 NOTE — Assessment & Plan Note (Signed)
Summary: shoulder pain wp   Vital Signs:  Patient Profile:   70 Years Old Female Height:     65 inches Weight:      132.4 pounds Pulse rate:   60 / minute BP sitting:   134 / 84  (right arm)  Pt. in pain?   yes    Location:   shoulders    Intensity:   6  Vitals Entered By: Mauricia Area CMA, (January 30, 2008 10:37 AM)              Is Patient Diabetic? No     PCP:  Candelaria Celeste MD  Chief Complaint:  shoulder pain.  History of Present Illness: [name] is a 70 year old woman c/o neck pain x 5 days. She states that it started Monday. On Tuesday she had her trainer massage the area with mild relief, but that it has persisted. She describes the pain as dull with no radiation, and points to her upper trap/ levator/ posterior neck muscles bilaterally when asked to point out where it hurts, Left > Right.   She exercises regularly, including lifting weights, but denies any injuries or any precipitating event. She does c/o increased stress in her life recently. She has been taking Tylenol for pain control with no relief.  Acute Visit History:      The patient complains of musculoskeletal symptoms.  She denies abdominal pain, chest pain, cough, diarrhea, fever, headache, nausea, rash, and vomiting.           Updated Prior Medication List: ESTRING 2 MG RING (ESTRADIOL) Insert every 2 months MEDROXYPROGESTERONE ACETATE 2.5 MG TABS (MEDROXYPROGESTERONE ACETATE) Take one tablet daily OMEPRAZOLE 20 MG CPDR (OMEPRAZOLE) Take 1 capsule by mouth two times a day TUMS E-X 750 MG CHEW (CALCIUM CARBONATE ANTACID) Take 2 tablet by mouth once a day GLUCOSAMINE-CHONDROITIN   CAPS (GLUCOSAMINE-CHONDROIT-VIT C-MN) Take 1 tab three times a day FISH OIL   OIL (FISH OIL) Take 1000 mg daily CALCIUM CARBONATE   POWD (CALCIUM CARBONATE)  VITAMIN D 1000 UNIT  TABS (CHOLECALCIFEROL) Take one tablet daily ASPIRIN 81 MG  TABS (ASPIRIN) Take one tablet daily CENTRUM SILVER   TABS (MULTIPLE  VITAMINS-MINERALS) Take one tablet daily FLEXERIL 5 MG TABS (CYCLOBENZAPRINE HCL) one-two tabs by mouth daily as needed for muscle spasm  Current Allergies: No known allergies    Past Medical History:    Reviewed history from 11/26/2006 and no changes required:       Bone Density normal - 12/02/2000       GB ultrasound normal - 05/30/2001       audiometry mild high freq loss - 02/01/2003       Colonoscopy few tics - 11/13/2002       EKG asymp PAC`s - 06/29/2003,  Past Surgical History:    Reviewed history from 11/18/2006 and no changes required:       Endometrial Biopsy - 07/30/1994       Esophageal dilatation - 04/30/1990, esophageal dilatation - 05/31/1997   Family History:    Reviewed history from 11/26/2006 and no changes required:       CA Colon - PUncle       Emphysema - F       Heart disease - M, Osteoporosis - M, died age 91       No siblings  Social History:    Reviewed history from 11/26/2006 and no changes required:       Lives with husband, [name],  retired Hydrologist [employer]       Alcohol - 3-4 drinks 1-2 times weekly       Non-smoker       Son, [place], in [place] - no grandchildren    Review of Systems  The patient denies weight loss, weight gain, chest pain, syncope, dyspnea on exertion, and peripheral edema.     Physical Exam  General:     Alert, well-developed, well-nourished, and well-hydrated.   Neck:     Supple.  limited ROM- restricted in rotation both right and left, and side-bending to the right. no pain or limited flexion, extension, + hypertonic paraspinal muscles bilaterally L>R. Lungs:     Normal respiratory effort, chest expands symmetrically. Lungs are clear to auscultation, no crackles or wheezes. Heart:     Normal rate and regular rhythm. S1 and S2 normal without gallop, murmur, click, rub or other extra sounds. Psych:     Cognition and judgment appear intact. Alert and cooperative with normal attention span and concentration.    Impression &  Recommendations:  Problem # 1:  MUSCLE SPASM, BACK (ICD-724.8) Advised [name] that this acute episode can be relieved with a muscle relaxer and Ibuprofen for pain and inflammation. She was also given instructions for passive stretching of her anterior adnd posterior neck muscles. Rx to [facility] for massage therapy. Follow up with Dr. Walker Kehr as needed.  Her updated medication list for this problem includes:    Aspirin 81 Mg Tabs (Aspirin) .Marland Kitchen... Take one tablet daily    Flexeril 5 Mg Tabs (Cyclobenzaprine hcl) ..... One-two tabs by mouth daily as needed for muscle spasm  Orders: [facility]- Est Level  3 ([id number])   Complete Medication List: 1)  Estring 2 Mg Ring (Estradiol) .... Insert every 2 months 2)  Medroxyprogesterone Acetate 2.5 Mg Tabs (Medroxyprogesterone acetate) .... Take one tablet daily 3)  Omeprazole 20 Mg Cpdr (Omeprazole) .... Take 1 capsule by mouth two times a day 4)  Tums E-x 750 Mg Chew (Calcium carbonate antacid) .... Take 2 tablet by mouth once a day 5)  Glucosamine-chondroitin Caps (Glucosamine-chondroit-vit c-mn) .... Take 1 tab three times a day 6)  Fish Oil Oil (Fish oil) .... Take 1000 mg daily 7)  Calcium Carbonate Powd (Calcium carbonate) 8)  Vitamin D 1000 Unit Tabs (Cholecalciferol) .... Take one tablet daily 9)  Aspirin 81 Mg Tabs (Aspirin) .... Take one tablet daily 10)  Centrum Silver Tabs (Multiple vitamins-minerals) .... Take one tablet daily 11)  Flexeril 5 Mg Tabs (Cyclobenzaprine hcl) .... One-two tabs by mouth daily as needed for muscle spasm   Patient Instructions: 1)  Take 400-657m of Ibuprofen (Advil, Motrin) with food every 4-6 hours as needed for relief of pain. 2)  Please schedule a follow-up appointment as needed.   Prescriptions: FLEXERIL 5 MG TABS (CYCLOBENZAPRINE HCL) one-two tabs by mouth daily as needed for muscle spasm  #12 x 0   Entered and Authorized by:   EBriscoe DeutscherMD   Signed by:   EBriscoe DeutscherMD on  01/30/2008   Method used:   Printed then faxed to ...       [facility]       [address]        Ph: [phone]      Fax: [phone]  [place]   [phone] ]

## 2010-06-01 NOTE — Consult Note (Signed)
Summary: GERD  [facility]   Imported By: Beryle Lathe 05/09/2010 10:26:42  _____________________________________________________________________  External Attachment:    Type:   Image     Comment:   External Document

## 2010-06-20 ENCOUNTER — Ambulatory Visit (HOSPITAL_COMMUNITY)
Admission: RE | Admit: 2010-06-20 | Discharge: 2010-06-20 | Disposition: A | Discharge status: Home / Self Care | Payer: Medicare Other | Source: Ambulatory Visit | Attending: Family Medicine | Admitting: Family Medicine

## 2010-06-20 ENCOUNTER — Encounter (HOSPITAL_COMMUNITY): Payer: Self-pay

## 2010-06-20 DIAGNOSIS — Z1231 Encounter for screening mammogram for malignant neoplasm of breast: Secondary | ICD-10-CM | POA: Insufficient documentation

## 2010-07-10 ENCOUNTER — Encounter: Payer: Self-pay | Admitting: Home Health Services

## 2010-09-12 NOTE — Op Note (Signed)
NAME:  [name], DERK              ACCOUNT NO.:  [id number]   MEDICAL RECORD NO.:  [id number]          PATIENT TYPE:  AMB   LOCATION:  SDC                           FACILITY:  [facility]   PHYSICIAN:  Everett Graff, M.D.   DATE OF BIRTH:  1940/07/14   DATE OF PROCEDURE:  11/04/2007  DATE OF DISCHARGE:                               OPERATIVE REPORT   PREOPERATIVE DIAGNOSIS:  Postmenopausal bleeding.   POSTOPERATIVE DIAGNOSIS:  Postmenopausal bleeding.   PROCEDURE:  1. Hysteroscopy.  2. D&C.   ANESTHESIA:  General via LMA.   FINDINGS:  No obvious intracavitary lesions and atrophic small uterine  cavity and no endocervical lesions as well.   SPECIMENS TO PATHOLOGY:  Endometrial curettings.   FLUIDS:  300 mL.   URINE OUTPUT:  Quantity sufficient via straight cath prior to procedure.   ESTIMATED BLOOD LOSS:  Minimal.   COMPLICATIONS:  None.   PROCEDURE:  The patient was taken to the operating room after the risks,  benefits, and alternatives discussed with the patient.  The patient  verbalized understanding and consent signed and witnessed.  The patient  was placed under general anesthesia and prepped and draped in normal  sterile fashion in the dorsal lithotomy position.  A bivalved speculum  was placed into the patient's vagina, and the anterior lip of the cervix  grasped with a single-tooth tenaculum.  A paracervical block was  administered using a total of 20 mL of 1% lidocaine.  The cervix was  dilated for passage of the hysteroscope which required initial dilation  with lacrimal duct dilators.  The hysteroscope was introduced after the  dilatation and no obvious intracavitary lesions were noted.  Curettage  was performed and curettings were sent to pathology.  Hysteroscope was  introduced once again and still no obvious findings.  The tenaculum was  removed.  There was good hemostasis at the tenaculum site.  Count was  correct.  The patient tolerated the procedure well and  is currently  awaiting transfer to the recovery room in good condition.     Everett Graff, M.D.  Electronically Signed    AR/MEDQ  D:  11/04/2007  T:  11/05/2007  Job:  [id number]

## 2010-09-15 NOTE — Op Note (Signed)
   NAME:  [name], [name]                ACCOUNT NO.:  [id number]   MEDICAL RECORD NO.:  [id number]                   PATIENT TYPE:  AMB   LOCATION:  ENDO                                 FACILITY:  [facility]   PHYSICIAN:  John C. Amedeo Plenty, M.D.                 DATE OF BIRTH:  Mar 29, 1941   DATE OF PROCEDURE:  11/11/2002  DATE OF DISCHARGE:                                 OPERATIVE REPORT   PROCEDURE:  Colonoscopy.   INDICATIONS FOR PROCEDURE:  Colon cancer screening in a 70 year old patient  with no previous screening.   DESCRIPTION OF PROCEDURE:  The patient was placed in the left lateral  decubitus position and placed on the pulse monitor with continuous low-flow  oxygen delivered by nasal cannula.  She was sedated with 62.5 mcg IV  fentanyl, 6 mg IV Versed.  The Olympus video colonoscope was inserted into  the rectum and advanced to the cecum, confirmed by transillumination of  McBurney's point and visualization of the ileocecal valve and appendiceal  orifice.  Prep was excellent.  The cecum, ascending, transverse, descending,  and sigmoid colon all appeared normal with no masses, polyps, diverticula,  or other mucosal abnormalities.  At the rectosigmoid junction there were  diverticula seen.  The rectum appeared normal and retroflexed view of the  anus revealed no obvious internal hemorrhoids.  The scope was then withdrawn  and the patient returned to the recovery room in stable condition.  She  tolerated the procedure well and there were no immediate complications.   IMPRESSION:  A few scattered rectosigmoid diverticula; otherwise normal  study.   PLAN:  The next colon screening tentatively by sigmoidoscopy in five years.                                                John C. Amedeo Plenty, M.D.    JCH/MEDQ  D:  11/11/2002  T:  11/11/2002  Job:  [id number]   cc:   Patrick Jupiter A. Walker Kehr, M.D.  [address]  [place] [address]  Fax: [phone]

## 2010-09-27 ENCOUNTER — Other Ambulatory Visit (INDEPENDENT_AMBULATORY_CARE_PROVIDER_SITE_OTHER): Payer: Medicare Other | Admitting: Family Medicine

## 2010-09-27 DIAGNOSIS — Z3009 Encounter for other general counseling and advice on contraception: Secondary | ICD-10-CM

## 2010-09-27 MED ORDER — MEDROXYPROGESTERONE ACETATE 2.5 MG PO TABS: 2.5000 mg | ORAL_TABLET | Freq: Every day | ORAL | Status: DC

## 2010-09-27 MED ORDER — ESTRADIOL 2 MG VA RING: 2.0000 mg | VAGINAL_RING | VAGINAL | Status: DC

## 2010-10-03 ENCOUNTER — Telehealth: Payer: Self-pay | Admitting: Family Medicine

## 2010-10-03 DIAGNOSIS — M899 Disorder of bone, unspecified: Secondary | ICD-10-CM

## 2010-10-03 NOTE — Telephone Encounter (Signed)
Will fwd. To Dr.Hale for order .[name]

## 2010-10-03 NOTE — Telephone Encounter (Signed)
Pt says she received a notice from [facility] that it is time for her bone density testing, wants to know if MD wants her to have one? If so, she would like to [facility]

## 2010-10-05 ENCOUNTER — Telehealth: Payer: Self-pay | Admitting: *Deleted

## 2010-10-05 NOTE — Assessment & Plan Note (Signed)
Last BMD test 2 years ago

## 2010-10-05 NOTE — Telephone Encounter (Signed)
Scheduled appt at [facility] for Bone Density Test 10-12-10 at 2:30 pm. Called pt and left detailed message on her voice mail .Mauricia Area

## 2010-10-05 NOTE — Telephone Encounter (Signed)
Please schedule her for a BMD test at [facility]

## 2010-10-12 ENCOUNTER — Ambulatory Visit (HOSPITAL_COMMUNITY): Payer: Medicare Other

## 2010-10-23 ENCOUNTER — Ambulatory Visit (HOSPITAL_COMMUNITY)
Admission: RE | Admit: 2010-10-23 | Discharge: 2010-10-23 | Disposition: A | Discharge status: Home / Self Care | Payer: Medicare Other | Source: Ambulatory Visit | Attending: Family Medicine | Admitting: Family Medicine

## 2010-10-23 DIAGNOSIS — Z780 Asymptomatic menopausal state: Secondary | ICD-10-CM | POA: Insufficient documentation

## 2010-10-23 DIAGNOSIS — Z13820 Encounter for screening for osteoporosis: Secondary | ICD-10-CM | POA: Insufficient documentation

## 2010-10-23 DIAGNOSIS — M949 Disorder of cartilage, unspecified: Secondary | ICD-10-CM

## 2010-12-14 ENCOUNTER — Ambulatory Visit (INDEPENDENT_AMBULATORY_CARE_PROVIDER_SITE_OTHER): Payer: Medicare Other | Admitting: Sports Medicine

## 2010-12-14 ENCOUNTER — Encounter: Payer: Self-pay | Admitting: Sports Medicine

## 2010-12-14 VITALS — BP 107/66 | HR 54 | Ht 65.0 in | Wt 125.0 lb

## 2010-12-14 DIAGNOSIS — M25552 Pain in left hip: Secondary | ICD-10-CM | POA: Insufficient documentation

## 2010-12-14 DIAGNOSIS — M25559 Pain in unspecified hip: Secondary | ICD-10-CM

## 2010-12-14 MED ORDER — IBUPROFEN 600 MG PO TABS: 600.0000 mg | ORAL_TABLET | Freq: Three times a day (TID) | ORAL | Status: DC | PRN

## 2010-12-14 NOTE — Assessment & Plan Note (Signed)
She was given a standard set of exercises and stretches to do for her hip pain  She will continue on ibuprofen 3 times daily for one week and then gradually wean off but is to stop if she has any GI pain  She may resume workouts but should avoid activities that hurt the left hip and reduce her walking to about half normal  Recheck with me in 4-6 weeks unless completely resolved

## 2010-12-14 NOTE — Progress Notes (Signed)
  Subjective:    Patient ID: [name], female    DOB: [birth year], 70 y.o.   MRN: [id number]  HPI  Pt presents to clinic for evaluation of lt lateral hip pain x 5 weeks. Pain started after pt was climbing up a ladder Painful when first getting up from seated position Cannot sleep on lt side  Ibuprofen slightly helpful - when she uses 600 mg she gets good relief  She continues to work with her Physiological scientist but has been holding off on activity because of pain  Review of Systems     Objective:   Physical Exam  NAD Normal flexion and abduction strength rt Normal flexion but with mild pain on left abduction strength is poor on the left and also recreates pain Pain with rotation strength on lt but it is good overall strength Slightly tender over greater troch and gluteus medius      Assessment & Plan:

## 2010-12-14 NOTE — Patient Instructions (Addendum)
Take 2 ibuprofen 2-3 times per day for the next week    Avoid deep squats and walk at 50% distance for the next 2 weeks

## 2010-12-15 ENCOUNTER — Other Ambulatory Visit: Payer: Self-pay | Admitting: Dermatology

## 2010-12-26 ENCOUNTER — Ambulatory Visit (INDEPENDENT_AMBULATORY_CARE_PROVIDER_SITE_OTHER): Payer: Medicare Other | Admitting: Home Health Services

## 2010-12-26 ENCOUNTER — Encounter: Payer: Self-pay | Admitting: Home Health Services

## 2010-12-26 VITALS — BP 112/68 | HR 48 | Temp 98.0°F | Ht 64.0 in | Wt 125.7 lb

## 2010-12-26 DIAGNOSIS — Z0000 Encounter for general adult medical examination without abnormal findings: Secondary | ICD-10-CM

## 2010-12-26 NOTE — Progress Notes (Signed)
Patient here for annual wellness visit, patient reports: Risk Factors/Conditions needing evaluation or treatment: Pt does not have any risk factors that need evaluation.  Home Safety: Pt lives with husband in 3 story home. Pt reports having smoke detectors and does not have adaptive equipment in bathroom. Other Information: Corrective lens: Pt wears corrective lens for driving and television. Dentures: Pt does not have dentures and visits dentist every 6 months. Memory: Pt reports some memory loss.  Has a hard time finding words sometimes.  Patient's Mini Mental Score (recorded in doc. flowsheet): 30  Balance/Gait: Pt reports going through PT for ankle with Dr. Oneida Alar, but has since completed. Balance Abnormal Patient value  Sitting balance    Sit to stand    Attempts to arise    Immediate standing balance    Standing balance    Nudge    Eyes closed- Romberg    Tandem stance    Back lean    Neck Rotation    360 degree turn    Sitting down     Gait Abnormal Patient value  Initiation of gait    Step length-left    Step length-right    Step height-left    Step height-right    Step symmetry    Step continuity    Path deviation    Trunk movement    Walking stance        Annual Wellness Visit Requirements Recorded Today In  Medical, family, social history Past Medical, Family, Social History Section  Current providers Care team  Current medications Medications  Wt, BP, Ht, BMI Vital signs  Hearing assessment (welcome visit) declined  Tobacco, alcohol, illicit drug use History  ADL Nurse Assessment  Depression Screening Nurse Assessment  Cognitive impairment Nurse Assessment  Mini Mental Status Document Flowsheet  Fall Risk Nurse Assessment  Home Safety Progress Note  End of Life Planning (welcome visit) Social Documentation  Medicare preventative services Progress Note  Risk factors/conditions needing evaluation/treatment Progress Note  Personalized health advice  Patient Instructions, goals, letter  Diet & Exercise Social Documentation  Emergency Contact Social Documentation  Seat Belts Social Documentation  Sun exposure/protection Social Documentation    Prevention Plan: Pt is up to date on all recommended screenings.  Recommended Medicare Prevention Screenings Women over 52 Test For Frequency Date of Last- BOLD if needed  Breast Cancer 1-2 yrs 2/11  Cervical Cancer 1-3 yrs done  Colorectal Cancer 1-10 yrs 7/04  Osteoporosis once 6/10  Cholesterol 5 yrs 7/10  Diabetes yearly Non diabetic  HIV yearly delcined  Influenza Shot yearly 10/11  Pneumonia Shot once 11/06  Zostavax Shot once 2/08

## 2010-12-26 NOTE — Patient Instructions (Addendum)
1. Bring in copy of living will for Dr. Walker Kehr 2. Limit alcohol drink to 1 per day. 3. Continue to walk and use balance ball 4-5 times a week.

## 2011-01-02 ENCOUNTER — Other Ambulatory Visit: Payer: Self-pay | Admitting: Family Medicine

## 2011-01-02 MED ORDER — KETOPROFEN POWD: 1.0000 "application " | Freq: Four times a day (QID) | Status: DC

## 2011-01-02 NOTE — Telephone Encounter (Signed)
Fax form completed for [employer]

## 2011-01-03 ENCOUNTER — Other Ambulatory Visit: Payer: Self-pay | Admitting: Family Medicine

## 2011-01-04 ENCOUNTER — Encounter: Payer: Self-pay | Admitting: Home Health Services

## 2011-01-12 ENCOUNTER — Ambulatory Visit (INDEPENDENT_AMBULATORY_CARE_PROVIDER_SITE_OTHER): Payer: Medicare Other | Admitting: *Deleted

## 2011-01-12 DIAGNOSIS — Z23 Encounter for immunization: Secondary | ICD-10-CM

## 2011-01-25 LAB — CBC
HCT: 40.2
Hemoglobin: 13.9
MCHC: 34.6
MCV: 101.6 — ABNORMAL HIGH
Platelets: 234
RBC: 3.95
RDW: 13.4
WBC: 6.2

## 2011-01-25 LAB — VITAMIN D 25 HYDROXY (VIT D DEFICIENCY, FRACTURES): Vit D, 25-Hydroxy: 55 (ref 30–89)

## 2011-04-26 ENCOUNTER — Telehealth: Payer: Self-pay | Admitting: Family Medicine

## 2011-04-26 NOTE — Telephone Encounter (Signed)
Ms. [name] would like to speak to a nurse about the strain of the flu vaccine that she and her husband have gotten.  She just wants to be sure they are covered for what is going around now.

## 2011-04-26 NOTE — Telephone Encounter (Signed)
Spoke with patient and discussed flu virus and how flu vaccine is formulated every year.  Discuss ways to protect themselves from getting virus, etc.

## 2011-05-09 ENCOUNTER — Ambulatory Visit: Payer: Medicare Other | Admitting: Family Medicine

## 2011-05-10 ENCOUNTER — Encounter: Payer: Self-pay | Admitting: Family Medicine

## 2011-05-10 NOTE — Telephone Encounter (Signed)
This encounter was created in error - please disregard.

## 2011-05-10 NOTE — Telephone Encounter (Signed)
Error

## 2011-05-11 ENCOUNTER — Ambulatory Visit (INDEPENDENT_AMBULATORY_CARE_PROVIDER_SITE_OTHER): Payer: Medicare Other | Admitting: Family Medicine

## 2011-05-11 ENCOUNTER — Encounter: Payer: Self-pay | Admitting: Family Medicine

## 2011-05-11 VITALS — BP 102/50 | HR 70 | Temp 98.2°F | Ht 64.0 in | Wt 129.0 lb

## 2011-05-11 DIAGNOSIS — B9789 Other viral agents as the cause of diseases classified elsewhere: Secondary | ICD-10-CM

## 2011-05-11 DIAGNOSIS — B349 Viral infection, unspecified: Secondary | ICD-10-CM

## 2011-05-11 MED ORDER — GUAIFENESIN-CODEINE 100-10 MG/5ML PO SOLN: 5.0000 mL | Freq: Three times a day (TID) | ORAL | Status: DC | PRN

## 2011-05-11 NOTE — Patient Instructions (Signed)
It was great to see you today!  Schedule an appointment to see your PCP as needed.  I gave you a prescription for robitussin with codeine.  You do not require antibiotics at this time. If you get a fever or it spreads to your   lungs you may need to come back.

## 2011-05-11 NOTE — Assessment & Plan Note (Signed)
Cough with post nasal drip. No lung/sinus involvement. Robitussin AC for cough.  Saline nasal spray.

## 2011-05-11 NOTE — Progress Notes (Signed)
  Subjective:  1. Cough, sore throat, sputum production, congestion    [name] is a 71 y.o. female here for evaluation of a cough. Onset of symptoms was 3 days ago. Symptoms have been gradually worsening since that time. The cough is productive of white sputum and is aggravated by  nighttime. Associated symptoms include: sputum production. Patient does not have a history of asthma. Patient does not have a history of environmental allergens. Patient has not traveled recently. Patient does not have a history of smoking. Patient has not had a previous chest x-ray. Patient has not had a PPD done.  Review of Systems Pertinent items are noted in HPI. No fever, chills, night sweats, weight loss.    Objective:   Filed Vitals:   05/11/11 0921  BP: 102/50  Pulse: 70  Temp: 98.2 F (36.8 C)  TempSrc: Oral  Height: 5' 4"  (1.626 m)  Weight: 129 lb (58.514 kg)  Lungs:  Normal respiratory effort, chest expands symmetrically. Lungs are clear to auscultation, no crackles or wheezes. Throat: normal mucosa, no exudate, uvula midline. Erythema present. Neck:  No deformities, thyromegaly, masses, or tenderness noted.   Supple with full range of motion without pain. Mouth - no lesions, mucous membranes are moist, no decaying teeth  Nose:  External nasal examination shows no deformity or inflammation. Nasal mucosa are pink and moist without lesions or exudates. No septal dislocation or dislocation.No obstruction to airflow.   Assessment:

## 2011-05-15 ENCOUNTER — Telehealth: Payer: Self-pay | Admitting: Family Medicine

## 2011-05-15 NOTE — Telephone Encounter (Signed)
Called pt and left message on identified voice mail 'can schedule mammogram at the same place she had it before' .[name]

## 2011-05-15 NOTE — Telephone Encounter (Signed)
Want to know if she should make a mammogram appt since she heard that it's not necessary to have every year.

## 2011-05-24 ENCOUNTER — Other Ambulatory Visit: Payer: Self-pay | Admitting: Family Medicine

## 2011-05-24 DIAGNOSIS — Z1231 Encounter for screening mammogram for malignant neoplasm of breast: Secondary | ICD-10-CM

## 2011-07-13 ENCOUNTER — Ambulatory Visit (HOSPITAL_COMMUNITY)
Admission: RE | Admit: 2011-07-13 | Discharge: 2011-07-13 | Disposition: A | Discharge status: Home / Self Care | Payer: Medicare Other | Source: Ambulatory Visit | Attending: Family Medicine | Admitting: Family Medicine

## 2011-07-13 DIAGNOSIS — Z1231 Encounter for screening mammogram for malignant neoplasm of breast: Secondary | ICD-10-CM | POA: Insufficient documentation

## 2011-07-27 ENCOUNTER — Other Ambulatory Visit: Payer: Self-pay | Admitting: Family Medicine

## 2011-07-27 DIAGNOSIS — R928 Other abnormal and inconclusive findings on diagnostic imaging of breast: Secondary | ICD-10-CM

## 2011-07-30 ENCOUNTER — Other Ambulatory Visit: Payer: Self-pay | Admitting: Family Medicine

## 2011-07-30 ENCOUNTER — Ambulatory Visit
Admission: RE | Admit: 2011-07-30 | Discharge: 2011-07-30 | Disposition: A | Discharge status: Home / Self Care | Payer: Medicare Other | Source: Ambulatory Visit | Attending: Family Medicine | Admitting: Family Medicine

## 2011-07-30 DIAGNOSIS — R928 Other abnormal and inconclusive findings on diagnostic imaging of breast: Secondary | ICD-10-CM

## 2011-09-11 ENCOUNTER — Ambulatory Visit (INDEPENDENT_AMBULATORY_CARE_PROVIDER_SITE_OTHER): Payer: Medicare Other | Admitting: Family Medicine

## 2011-09-11 ENCOUNTER — Encounter: Payer: Self-pay | Admitting: Family Medicine

## 2011-09-11 DIAGNOSIS — H9319 Tinnitus, unspecified ear: Secondary | ICD-10-CM

## 2011-09-11 DIAGNOSIS — K219 Gastro-esophageal reflux disease without esophagitis: Secondary | ICD-10-CM

## 2011-09-11 NOTE — Progress Notes (Signed)
  Subjective:    Patient ID: [name], female    DOB: Mar 27, 1941, 71 y.o.   MRN: [id number]  HPI She is increasingly bothered by tinnitus without increase than her prior high frequency hearing loss. No vertigo or gait problems  Taking I caps for ARMD  Taking a form of Doxycycline her dermatologist prescribed for rosacea  No recent dysphagia on Omeprazole bid. Dr Watt Climes started a new medication called Align which I can't find  She's been off of hormone replacement for 1.5 years   Review of Systems     Objective:   Physical Exam  HENT:  Right Ear: External ear normal.  Left Ear: External ear normal.  Nose: Nose normal.  Mouth/Throat: Oropharynx is clear and moist.  Neck: No thyromegaly present.  Cardiovascular: Normal rate and regular rhythm.   No murmur heard.      No carotid or temporal bruits  Pulmonary/Chest: Effort normal and breath sounds normal.  Lymphadenopathy:    She has no cervical adenopathy.          Assessment & Plan:

## 2011-09-11 NOTE — Patient Instructions (Signed)
Please schedule fasting lab tests at your convenience  I will let you know your audiology results regarding the tinnitus and whether further testing is needed.

## 2011-09-11 NOTE — Assessment & Plan Note (Signed)
Likely related to her hearing loss

## 2011-09-21 ENCOUNTER — Ambulatory Visit (INDEPENDENT_AMBULATORY_CARE_PROVIDER_SITE_OTHER): Payer: Medicare Other | Admitting: Family Medicine

## 2011-09-21 VITALS — BP 155/77 | Ht 65.0 in | Wt 125.0 lb

## 2011-09-21 DIAGNOSIS — G8929 Other chronic pain: Secondary | ICD-10-CM

## 2011-09-21 DIAGNOSIS — M25572 Pain in left ankle and joints of left foot: Secondary | ICD-10-CM

## 2011-09-21 DIAGNOSIS — M19072 Primary osteoarthritis, left ankle and foot: Secondary | ICD-10-CM | POA: Insufficient documentation

## 2011-09-21 DIAGNOSIS — M25579 Pain in unspecified ankle and joints of unspecified foot: Secondary | ICD-10-CM

## 2011-09-21 NOTE — Progress Notes (Signed)
  Subjective:    Patient ID: [name], female    DOB: 05/18/1940, 71 y.o.   MRN: [id number]  HPI Left ankle pain with swelling In childhood had an ankle fracture with syndesmosis injury probably - in cast x 6 wks In 40s had a similar injury  I saw her with ankle instability and sent her to PT a number of years ago  Has had chronic foot pain at times helped by [place]  Has used orthotics long term and these have helped One set is 71 yrs old but has orthotic fractures now Second set is 71 years old and is still stable MT pads on both   Review of Systems     Objective:   Physical Exam  NAD  Left ankle is swollen with effusion Ankle motion on inversion and eversion is 20% less on left although right is hypermobile with laxity inversion and eversion Dorsiflexion and plantarflexion preserved on left  During walking gait she is now getting some dynamic subluxation of the left ankle at the tibiotalar joint       Assessment & Plan:

## 2011-09-21 NOTE — Assessment & Plan Note (Signed)
I placed her in an ankle compression sleeve She really likes the comfort of this This did control about half of her ankle subluxation She should keep this on about one hour after exercising to lessen swelling She should moderately resume her walking but does it slowly using this correction and to continue using her orthotics  Try Tylenol for pain  I will review the x-rays and then recheck her progress with this in 6 weeks

## 2011-09-21 NOTE — Patient Instructions (Signed)
Use the compression ankle sleeve for activity Keep on for 1 hour after activity  Try either ice or heat and use what helps  Try tylenol for arthritis type pain but if this does not help enough ask dr Walker Kehr about tramadol

## 2011-10-01 ENCOUNTER — Telehealth: Payer: Self-pay | Admitting: *Deleted

## 2011-10-01 NOTE — Telephone Encounter (Signed)
Message copied by Ocie Bob on Mon Oct 01, 2011  5:41 PM ------      Message from: [name], [name]      Created: Mon Oct 01, 2011  9:59 AM      Regarding: phone message      Contact: [phone]       Pt would like a call regarding exercises on her ankle pain

## 2011-10-01 NOTE — Telephone Encounter (Signed)
Called back and left voicemail to return my call.

## 2011-10-02 NOTE — Telephone Encounter (Signed)
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  She has been wearing bodyhelix full ankle most of the day because it is very comfortable.  Advised to continue using ankle sleeve esp for at least 1 hour after exercising.  Also, she was not rx'd any exercises for ankle as this is not a sprain.  Told her the most important thing is to keep good ROM and use compression until f/u appt. Pt agreeable.

## 2011-10-09 ENCOUNTER — Ambulatory Visit: Payer: Medicare Other | Admitting: Sports Medicine

## 2011-10-10 ENCOUNTER — Ambulatory Visit
Admission: RE | Admit: 2011-10-10 | Discharge: 2011-10-10 | Disposition: A | Discharge status: Home / Self Care | Payer: Medicare Other | Source: Ambulatory Visit | Attending: Sports Medicine | Admitting: Sports Medicine

## 2011-10-10 DIAGNOSIS — M25572 Pain in left ankle and joints of left foot: Secondary | ICD-10-CM

## 2011-10-30 ENCOUNTER — Ambulatory Visit (INDEPENDENT_AMBULATORY_CARE_PROVIDER_SITE_OTHER): Payer: Medicare Other | Admitting: Sports Medicine

## 2011-10-30 VITALS — BP 114/67 | HR 55

## 2011-10-30 DIAGNOSIS — S93499A Sprain of other ligament of unspecified ankle, initial encounter: Secondary | ICD-10-CM

## 2011-10-30 DIAGNOSIS — M25572 Pain in left ankle and joints of left foot: Secondary | ICD-10-CM

## 2011-10-30 DIAGNOSIS — M25579 Pain in unspecified ankle and joints of unspecified foot: Secondary | ICD-10-CM

## 2011-10-30 DIAGNOSIS — M67979 Unspecified disorder of synovium and tendon, unspecified ankle and foot: Secondary | ICD-10-CM

## 2011-10-30 NOTE — Progress Notes (Signed)
  Subjective:    Patient ID: [name], female    DOB: November 10, 1940, 71 y.o.   MRN: [id number]  HPI  [name] comes in for follow up of her left ankle pain.  She says it is feeling 40% better.  She wears the body helix ankle sleeve all day but takes it off at night, and says it feel great.  She takes ASA or tylenol prn for pain.  She has been doing hip exercises with her husband, as well as walking and working out with her Physiological scientist.  She had x-rays of her ankle after last visit that showed mild arthritis with a calcification vs. Avulsion fragment in the medial joint.   She also mentions a new bump on the back of her right heel.  She says she noticed it one day in the bathtub, but does not know what happened.  She does not have pain from it, and has continued her regular exercises without difficulty. Notices this more w some shoes.  Review of Systems See HPI.     Objective:   Physical Exam Left Ankle: Some change in shape of medial malleolus and possible synovial thickening Range of motion is decreased with varus and valgus movement, normal in flexion and extension.  Strength is 5/5 in all directions. Stable lateral and medial ligaments; squeeze test unremarkable; Talar dome mild tenderness; No pain at base of 5th MT; Mild tenderness over cuboid; No tenderness on posterior aspects of lateral and medial malleolus Able to walk 4 steps.  Right ankle:  There is a nodule about 3-4 cm above insertion of achillis.  Non-tender. No redness. No other deformity or swelling noted.        Assessment & Plan:

## 2011-10-30 NOTE — Patient Instructions (Addendum)
Please wear the ankle sleeves on both ankles- it is OK to wear it all day if it feels good, but take them off at night time. You can continue to use the ketoprofen on your ankle.   For your left ankle- loop the thera band around your foot and do the exercises- move your ankle in each direction about 10 times, repeat for 2-3 sets.  If this hurts, do not move your ankle as far.    Dr. Oneida Alar is concerned you have a partial tear in your right achillis tendon.  Please wear the ankle brace.  Tell your trainer Dr. Oneida Alar wants you to do gentle heel raises on a stair.    Please make an appointment for next week to do an ultrasound of your right ankle.

## 2011-10-30 NOTE — Assessment & Plan Note (Signed)
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  Will continue ankle sleeve, ketoprofen topical, tylenol and Asprin PRN.  Gave theraband with HEP for gentle strengthening of ankle.  Advised not to do exercises that cause pain.

## 2011-10-30 NOTE — Assessment & Plan Note (Signed)
Right. Non-painful but exam concerning for partial rupture of tendon.  Gave ankle sleeve for right ankle, advised to be careful with her exercises, do gentle heel lifts.  Follow up next week for [facility] [place] to eval if patient would benefit from Nitroglycerin patch protocol.

## 2011-11-07 ENCOUNTER — Ambulatory Visit (INDEPENDENT_AMBULATORY_CARE_PROVIDER_SITE_OTHER): Payer: Medicare Other | Admitting: Family Medicine

## 2011-11-07 VITALS — BP 117/72

## 2011-11-07 DIAGNOSIS — S93499A Sprain of other ligament of unspecified ankle, initial encounter: Secondary | ICD-10-CM

## 2011-11-07 DIAGNOSIS — M67979 Unspecified disorder of synovium and tendon, unspecified ankle and foot: Secondary | ICD-10-CM

## 2011-11-07 MED ORDER — NITROGLYCERIN 0.2 MG/HR TD PT24: MEDICATED_PATCH | TRANSDERMAL | Status: DC

## 2011-11-07 NOTE — Assessment & Plan Note (Signed)
Patient has an Achilles tendon nodule.  There is some evidence of neovessels.  The Achilles tendon is intact. There is no history that would explain the development of a nodule.   This is somewhat concerning as she has increased risk for Achilles tendon rupture.   Plan to treat with nitroglycerin patch, heel raises using both feet on a small book, ice and followup in 4 weeks with myself or Dr. Oneida Alar.    Discussed warning signs or symptoms. Please see discharge instructions. Patient expresses understanding.

## 2011-11-07 NOTE — Patient Instructions (Addendum)
Thank you for coming in today. We see an achilles nodule.  This indicates a weak spot in the tendon.  We will use a nitroglycerin patch on the tendon.  Apply a 1/4 of a patch on the tendon daily.  Also do those heel lifts we talked about. Do 3 sets of 8 reps three times a day.  Please come back in 4 weeks or sooner if needed.

## 2011-11-07 NOTE — Progress Notes (Signed)
[name] is a 71 y.o. female who presents to [facility] today for followup of right Achilles tendon nodule with plan for ultrasound.  She was recently seen by Dr. Oneida Alar for an unrelated issue at that visit she incidentally noted a right Achilles tendon nodule.  She does not have any significant pain at this nodule has not really been limiting her activity.  She denies any recent injury and cannot remember any exposure to  quinolone antibiotics.  She feels well otherwise   PMH reviewed.  History  Substance Use Topics  . Smoking status: Never Smoker   . Smokeless tobacco: Never Used  . Alcohol Use: 7.5 oz/week    15 drink(s) per week   ROS as above otherwise neg   Exam:  BP 117/72 Gen: Well NAD MSK: Right Achilles tendon. Palpable nodule approximately 2-3 cm proximal to the insertion.  Small palpable bandlike structure on the medial side.  Mildly tender. Normal ankle motion.  Ultrasound examination: Ultrasound reveals a 1 cm thickness Achilles tendon nodule compared to normal Achilles tendon of 0.4 cm.  Small neo-vessels seen on longitudinal and transverse views. The Achilles tendon appears to be intact. Small fibrotic band seen on the medial aspect of the nodule.

## 2011-11-08 ENCOUNTER — Telehealth: Payer: Self-pay | Admitting: Family Medicine

## 2011-11-08 ENCOUNTER — Other Ambulatory Visit: Payer: Self-pay | Admitting: Family Medicine

## 2011-11-08 DIAGNOSIS — Z79899 Other long term (current) drug therapy: Secondary | ICD-10-CM | POA: Insufficient documentation

## 2011-11-08 DIAGNOSIS — E785 Hyperlipidemia, unspecified: Secondary | ICD-10-CM

## 2011-11-08 DIAGNOSIS — K219 Gastro-esophageal reflux disease without esophagitis: Secondary | ICD-10-CM

## 2011-11-08 NOTE — Telephone Encounter (Signed)
Will forward to Dr Walker Kehr

## 2011-11-08 NOTE — Telephone Encounter (Signed)
Patient has called to schedule Fasting Labs as directed by Dr. Walker Kehr in the Office Visit on 09/11/11.  She is scheduled for tomorrow, but the orders need to be entered please.

## 2011-11-09 ENCOUNTER — Other Ambulatory Visit: Payer: Medicare Other

## 2011-11-09 DIAGNOSIS — E785 Hyperlipidemia, unspecified: Secondary | ICD-10-CM

## 2011-11-09 DIAGNOSIS — Z79899 Other long term (current) drug therapy: Secondary | ICD-10-CM

## 2011-11-09 DIAGNOSIS — K219 Gastro-esophageal reflux disease without esophagitis: Secondary | ICD-10-CM

## 2011-11-09 LAB — COMPREHENSIVE METABOLIC PANEL
BUN: 13 mg/dL (ref 6–23)
CO2: 29 mEq/L (ref 19–32)
Calcium: 9.1 mg/dL (ref 8.4–10.5)
Chloride: 107 mEq/L (ref 96–112)
Creat: 0.57 mg/dL (ref 0.50–1.10)
Glucose, Bld: 88 mg/dL (ref 70–99)

## 2011-11-09 LAB — LIPID PANEL
Cholesterol: 265 mg/dL — ABNORMAL HIGH (ref 0–200)
Triglycerides: 64 mg/dL (ref <150)
VLDL: 13 mg/dL (ref 0–40)

## 2011-11-09 LAB — CBC
Hemoglobin: 13.7 g/dL (ref 12.0–15.0)
MCHC: 34.3 g/dL (ref 30.0–36.0)
RDW: 13.5 % (ref 11.5–15.5)

## 2011-11-09 NOTE — Progress Notes (Signed)
CBC,CMP AND FLP DONE TODAY Edem Tiegs

## 2011-11-22 ENCOUNTER — Other Ambulatory Visit: Payer: Self-pay | Admitting: Sports Medicine

## 2011-11-22 ENCOUNTER — Encounter: Payer: Self-pay | Admitting: Family Medicine

## 2011-11-26 ENCOUNTER — Ambulatory Visit (INDEPENDENT_AMBULATORY_CARE_PROVIDER_SITE_OTHER): Payer: Medicare Other | Admitting: Family Medicine

## 2011-11-26 ENCOUNTER — Encounter: Payer: Self-pay | Admitting: Family Medicine

## 2011-11-26 VITALS — BP 117/64 | HR 62

## 2011-11-26 DIAGNOSIS — S96819A Strain of other specified muscles and tendons at ankle and foot level, unspecified foot, initial encounter: Secondary | ICD-10-CM

## 2011-11-26 DIAGNOSIS — M67979 Unspecified disorder of synovium and tendon, unspecified ankle and foot: Secondary | ICD-10-CM

## 2011-11-28 NOTE — Progress Notes (Signed)
  Subjective:    Patient ID: [name], female    DOB: [birth year], 71 y.o.   MRN: [id number]  HPI  Right Achilles tendon nodule. She was placed on nitroglycerin patch. Has not had any problems with the patch but has noted that she's having a lot more swelling around the Achilles tendon and thinks she has a new nodule that really swelled up yesterday. She's been doing her heel raises 3 times a day without fail. Wilburn Mylar was doing her normal activities when she noticed the right ankle was extremely swollen particularly on the medial side. It did go down overnight. She's quite worried about this.  Review of Systems Denies unusual weight change. Denies fever.    Objective:   Physical Exam Vital signs reviewed. GENERAL: Well developed, well nourished, no acute distress Ankle: Right ankle full range of motion. Achilles tendon is intact. There is a palpable 1 cm nodule that is very mildly tender. There is no defect noted in the tendon. She has normal dorsiflexion and plantar flexion strength. Small 5 mm in diameter palpable mass posterior to the medial malleolus. Neck line ULTRASOUND: The new palpable masses actually vasculature. It is compressible. There is no sign of pseudoaneurysm. The Achilles tendon is intact without any sign of edema. There is a 1 cm nodule noted. This is consistent with the previous ultrasound although I measured it a little bit larger today I think I included some area that may not be exactly where they measure before.       Assessment & Plan:

## 2011-11-28 NOTE — Assessment & Plan Note (Signed)
Achilles tendon nodule. She has been started on nitroglycerin glycerin patch and I would discontinue that. I don't know why she had some increased swelling. She was also worried about this area of nodularity which I think is just some vasculature. Ultrasound did not reveal anything worrisome today and she is reassured by that. She will return to her regular followup schedule.

## 2011-12-12 ENCOUNTER — Ambulatory Visit (INDEPENDENT_AMBULATORY_CARE_PROVIDER_SITE_OTHER): Payer: Medicare Other | Admitting: Sports Medicine

## 2011-12-12 VITALS — BP 100/60 | Ht 64.0 in | Wt 123.0 lb

## 2011-12-12 DIAGNOSIS — S96819A Strain of other specified muscles and tendons at ankle and foot level, unspecified foot, initial encounter: Secondary | ICD-10-CM

## 2011-12-12 DIAGNOSIS — M67979 Unspecified disorder of synovium and tendon, unspecified ankle and foot: Secondary | ICD-10-CM

## 2011-12-12 NOTE — Progress Notes (Signed)
  Subjective:    Patient ID: [name], female    DOB: 03-16-1941, 71 y.o.   MRN: [id number]  HPI Patient returns for followup large Achilles tendon nodule. Patient continues to wear the nitroglycerin quarter patch. She has not had any problems or side effects to this medication. She states she had been doing very well up to this last day. Patient states that she was at a department store was walking and felt a twinge in the back of her Achilles. Patient states that she had significant swelling again in the nodule seemed to be bigger. Patient states that it started to come down again since then. Patient states though that this does not seem to be stopping her from any of her regular activities of daily living. Denies any numbness or weakness in the extremity. She is walking 20 minutes a day and does exercises with a personal trainer  Review of Systems Denies unusual weight change. Denies fever.    Objective:   Physical Exam Vital signs reviewed. GENERAL: Well developed, well nourished, no acute distress Ankle: Right ankle full range of motion. Achilles tendon is intact. There is a palpable 1 cm nodule 4-5 cm proximal to the insertion of the Achilles, nontender to palpation.. There is no defect noted in the tendon. She has normal dorsiflexion and plantar flexion strength.   ULTRASOUND:   The Achilles tendon is intact without any sign of edema. There is nodule in same area but does not have any hypoechoic changes surrounding it. Patient's nodule now measures 0.7 cm which is a drastic change from 0.91 cm previously and from 1.06 on her initial exam. This indicates improvement.      Assessment & Plan:

## 2011-12-12 NOTE — Patient Instructions (Signed)
You are healing well. Your nodule decreased from 1.06cm to .71cm which is great! Increase your nitro patch to 1/2 patch daily, if you get headaches then decrease back to 1/4 patch Increase your walking by 5 minutes and can do that every 2 weeks.  Continue to do the exercises because they are helping Continue to wear the ankle brace.  Come back again in 4-6 weeks. We will rescan it again at that time.

## 2011-12-12 NOTE — Assessment & Plan Note (Addendum)
Patient still has this nodule but is continuing to improve and it is decreasing in size. Encourage patient to try to increase her nitroglycerin patch up to a half patch daily. Patient is to decrease again if she has any side effects to the medication. Patient will continue doing her exercises. Patient will also continue wearing a brace when she does most of her normal activity or when she's going to be on her feet for long periods of time. Patient will followup again in 6 weeks' time.  Modify her rehabilitation exercises and work with her personal trainer doing these.

## 2012-01-14 ENCOUNTER — Encounter: Payer: Self-pay | Admitting: Home Health Services

## 2012-01-18 ENCOUNTER — Encounter: Payer: Self-pay | Admitting: Family Medicine

## 2012-01-24 ENCOUNTER — Encounter: Payer: Self-pay | Admitting: Sports Medicine

## 2012-01-24 ENCOUNTER — Ambulatory Visit (INDEPENDENT_AMBULATORY_CARE_PROVIDER_SITE_OTHER): Payer: Medicare Other | Admitting: Sports Medicine

## 2012-01-24 VITALS — BP 116/70 | HR 51 | Ht 64.0 in | Wt 122.0 lb

## 2012-01-24 DIAGNOSIS — M67979 Unspecified disorder of synovium and tendon, unspecified ankle and foot: Secondary | ICD-10-CM

## 2012-01-24 DIAGNOSIS — G8929 Other chronic pain: Secondary | ICD-10-CM

## 2012-01-24 DIAGNOSIS — S96819A Strain of other specified muscles and tendons at ankle and foot level, unspecified foot, initial encounter: Secondary | ICD-10-CM

## 2012-01-24 DIAGNOSIS — M25579 Pain in unspecified ankle and joints of unspecified foot: Secondary | ICD-10-CM

## 2012-01-24 NOTE — Progress Notes (Signed)
Patient ID: [name], female   DOB: [birth year], 71 y.o.   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 Feels much better with compression She also has a large right Achilles tendon nodule She has been doing her exercises The pain is much less and the Achilles feels like it gets much less swelling Wearing the compression sleeve she can walk up to 2-3 miles a day without too much ankle pain   Physical examination No acute distress There is some mild generalized left ankle swelling Limitation of motion of the left ankle However all these findings are much less than on her last visit The ankle is nontender to palpation  The right Achilles tendon shows a nodule that is moderately thick about 2-3 cm above the calcaneal insertion This is nontender to squeeze The tendon itself is not swollen

## 2012-01-24 NOTE — Patient Instructions (Addendum)
For the Right foot Keep doing some of the heel raises Just don't go to a level of pain Keep using compression and NTG  Left ankle Not important to do heel raises Do keep up some motion exercises Continue walking You will always need compression for this ankle to prevent swelling NTG not proven in arthritis but not harmful  See me in 3 months

## 2012-01-30 NOTE — Assessment & Plan Note (Signed)
Continue to use the ankle compression sleeve Continue ice Continue easy motion exercises  I think she should keep up the same level walking but not push the level too high  Recheck in 3 months

## 2012-01-30 NOTE — Assessment & Plan Note (Signed)
This nodule seemed stable and she feels much better in regard pain using the nitroglycerin patch  Continue using this as I think it will take 6-12 months to remodel her tendon

## 2012-02-01 ENCOUNTER — Ambulatory Visit (INDEPENDENT_AMBULATORY_CARE_PROVIDER_SITE_OTHER): Payer: Medicare Other | Admitting: *Deleted

## 2012-02-01 ENCOUNTER — Ambulatory Visit (INDEPENDENT_AMBULATORY_CARE_PROVIDER_SITE_OTHER): Payer: Medicare Other | Admitting: Home Health Services

## 2012-02-01 ENCOUNTER — Encounter: Payer: Self-pay | Admitting: Home Health Services

## 2012-02-01 VITALS — BP 115/75 | HR 54 | Temp 98.3°F | Ht 64.0 in | Wt 128.3 lb

## 2012-02-01 DIAGNOSIS — Z23 Encounter for immunization: Secondary | ICD-10-CM

## 2012-02-01 DIAGNOSIS — Z0000 Encounter for general adult medical examination without abnormal findings: Secondary | ICD-10-CM

## 2012-02-01 NOTE — Progress Notes (Signed)
Patient here for annual wellness visit, patient reports: Risk Factors/Conditions needing evaluation or treatment: Pt does not have any new risk factors that need evaluation. Home Safety: Pt lives at home with husband, 3 story home.  Pt reports having smoke detectors and adaptive equipment. Other Information: Corrective lens: Pt uses corrective lens for reading. Has annual eye exams. Dentures: Pt does not have dentures or partials.  Has annual dental cleanings Memory: Pt reports some memory problems but nothing new this year. Patient's Mini Mental Score (recorded in doc. flowsheet): 30  Balance/Gait: Pt does not have any noticeable impairment. Balance Abnormal Patient value  Sitting balance    Sit to stand    Attempts to arise    Immediate standing balance    Standing balance    Nudge    Eyes closed- Romberg    Tandem stance    Back lean    Neck Rotation    360 degree turn    Sitting down     Gait Abnormal Patient value  Initiation of gait    Step length-left    Step length-right    Step height-left    Step height-right    Step symmetry    Step continuity    Path deviation    Trunk movement    Walking stance        Annual Wellness Visit Requirements Recorded Today In  Medical, family, social history Past Medical, Family, Social History Section  Current providers Care team  Current medications Medications  Wt, BP, Ht, BMI Vital signs  Tobacco, alcohol, illicit drug use History  ADL Nurse Assessment  Depression Screening Nurse Assessment  Cognitive impairment Nurse Assessment  Mini Mental Status Document Flowsheet  Fall Risk Nurse Assessment  Home Safety Progress Note  End of Life Planning (welcome visit) Social Documentation  Medicare preventative services Progress Note  Risk factors/conditions needing evaluation/treatment Progress Note  Personalized health advice Patient Instructions, goals, letter  Diet & Exercise Social Documentation  Emergency Contact  Social Documentation  Seat Belts Social Documentation  Sun exposure/protection Social Documentation    Prevention Plan:   Recommended Medicare Prevention Screenings Women over 65 Test For Frequency Date of Last- BOLD if needed  Breast Cancer 1-2 yrs 4/13  Cervical Cancer 1-3 yrs NI- due to age  Colorectal Cancer 1-10 yrs 7/04  Osteoporosis once 6/12  Cholesterol 5 yrs 7/13  Diabetes yearly 7/13  HIV yearly declined  Influenza Shot yearly 10/13  Pneumonia Shot once 11/06  Zostavax Shot once 2/08   I have reviewed this visit and discussed with Lamont Dowdy and agree with her documentation. Cloverly A. Walker Kehr, MD

## 2012-02-01 NOTE — Patient Instructions (Signed)
1. Continue exercising 2-3 times a week. 2. Continue focusing diet on 3-5 fruits and vegetables a day. 3. Consider limiting daily alcohol to 1 glass per day. 4. Consider removing area rug from home for fall safety.

## 2012-02-03 ENCOUNTER — Encounter: Payer: Self-pay | Admitting: Home Health Services

## 2012-02-18 ENCOUNTER — Ambulatory Visit (INDEPENDENT_AMBULATORY_CARE_PROVIDER_SITE_OTHER): Payer: Medicare Other | Admitting: Family Medicine

## 2012-02-18 VITALS — BP 90/60 | Ht 64.0 in | Wt 128.0 lb

## 2012-02-18 DIAGNOSIS — M25539 Pain in unspecified wrist: Secondary | ICD-10-CM

## 2012-02-18 DIAGNOSIS — M25531 Pain in right wrist: Secondary | ICD-10-CM

## 2012-02-19 NOTE — Progress Notes (Signed)
  Subjective:    Patient ID: [name], female    DOB: 1941/02/05, 71 y.o.   MRN: [id number]  HPI  Right wrist pain. Noticed it beginning in the last 2 weeks after she did some posture exercises involving care of and. No specific injury during exercise but noticed it the day after that she was having trouble picking things up because it hurt. Since then she's continued to have ulnar-sided right wrist pain with certain activities. Has noted no redness or warmth. Feels like it's a little bit swollen. She's been using some wrist straps to see if it would help it feel better and that has not improved.  PERTINENT  PMH / PSH: No prior history of right wrist injury or surgery. She has had other areas of osteoarthritis in her joints. Review of Systems Denies fever. No numbness or tingling in the right hand or wrist.    Objective:   Physical Exam  Vital signs are reviewed GENERAL: Well-developed female no acute distress. WRISTS: Right. Full range of motion in flexion extension ulnar and radial deviation. No tenderness to palpation over the ulnar or radial joint line. Grip strength is normal. ULTRASOUND: The ulnar carpal joint reveals a small amount of effusion. The rest of the wrist exam is normal except for some mild osteophytes at the radiocarpal joint.      Assessment & Plan:  #1. Mild wrist strain. I would recommend she immobilizer in a cock-up splint. I have given her prescription for that since we did not have one here for her. I would wear during the day for the next 2 weeks and not do a lot of extra activity, specifically I recommended she not plan to several doesn't pain sees that was on her to do this for this weekend. In 2 weeks so taper out of the splint and if she's not resolved, I would have her return to clinic the

## 2012-02-26 ENCOUNTER — Other Ambulatory Visit: Payer: Self-pay | Admitting: *Deleted

## 2012-02-26 MED ORDER — NITROGLYCERIN 0.2 MG/HR TD PT24: MEDICATED_PATCH | TRANSDERMAL | Status: DC

## 2012-03-07 ENCOUNTER — Encounter: Payer: Self-pay | Admitting: Family Medicine

## 2012-03-07 ENCOUNTER — Ambulatory Visit (INDEPENDENT_AMBULATORY_CARE_PROVIDER_SITE_OTHER): Payer: Medicare Other | Admitting: Family Medicine

## 2012-03-07 VITALS — BP 119/75 | HR 54 | Ht 64.0 in | Wt 128.0 lb

## 2012-03-07 DIAGNOSIS — M25531 Pain in right wrist: Secondary | ICD-10-CM

## 2012-03-07 DIAGNOSIS — M25539 Pain in unspecified wrist: Secondary | ICD-10-CM

## 2012-03-07 NOTE — Assessment & Plan Note (Signed)
Good response to partial immobilization in a cockup wrist splint. She has excellent range of motion and strength. I don't think she needs to do a rehabilitation program specifically. She may return to regular activities with use of good judgment. Basically of something hurts then quit doing it. Wear splint when necessary. She may return to clinic for this issue when necessary

## 2012-03-07 NOTE — Progress Notes (Signed)
  Subjective:    Patient ID: [name], female    DOB: 07/18/1940, 71 y.o.   MRN: [id number]  HPI Followup of right wrist pain. I had placed her in a cock-up splint and decreased her activities, specifically gardening. She is about 80-90% better. She is only wearing the splint intermittently. She is happy with the improvement.   Review of Systems Denies numbness or tingling in her right hand.    Objective:   Physical Exam Vital signs are reviewed GENERAL: Well-developed female no acute distress WRISTS: Right. Full range and full-strength in extension and flexion. Supination still mildly painful if resisted. Wrist is nontender to palpation. There is no swelling, no erythema. Distally she is neurovascularly intact.       Assessment & Plan:

## 2012-04-15 ENCOUNTER — Ambulatory Visit: Payer: Medicare Other | Admitting: Sports Medicine

## 2012-04-16 ENCOUNTER — Ambulatory Visit (INDEPENDENT_AMBULATORY_CARE_PROVIDER_SITE_OTHER): Payer: Medicare Other | Admitting: Sports Medicine

## 2012-04-16 VITALS — BP 122/58 | Ht 65.0 in | Wt 122.0 lb

## 2012-04-16 DIAGNOSIS — G8929 Other chronic pain: Secondary | ICD-10-CM

## 2012-04-16 DIAGNOSIS — S93499A Sprain of other ligament of unspecified ankle, initial encounter: Secondary | ICD-10-CM

## 2012-04-16 DIAGNOSIS — M67979 Unspecified disorder of synovium and tendon, unspecified ankle and foot: Secondary | ICD-10-CM

## 2012-04-16 DIAGNOSIS — M25579 Pain in unspecified ankle and joints of unspecified foot: Secondary | ICD-10-CM

## 2012-04-16 DIAGNOSIS — M25519 Pain in unspecified shoulder: Secondary | ICD-10-CM

## 2012-04-16 DIAGNOSIS — M25531 Pain in right wrist: Secondary | ICD-10-CM

## 2012-04-16 DIAGNOSIS — M25511 Pain in right shoulder: Secondary | ICD-10-CM | POA: Insufficient documentation

## 2012-04-16 DIAGNOSIS — M25539 Pain in unspecified wrist: Secondary | ICD-10-CM

## 2012-04-16 NOTE — Progress Notes (Signed)
  Subjective:    Patient ID: [name], female    DOB: Oct 26, 1940, 71 y.o.   MRN: [id number]  HPI  Patient is a 71 yo F presenting for follow up for chronic wrist and ankle pain, as well as acute right shoulder pain  1. Wrist pain- bilateral arthritis. Patient has been using wrist supports with exercise, gardening and excessive use. She states this helps a lot. She also takes ASA as needed for pain, but overall she is improving and has no wrist complaints today. She does need new supports.  2. Ankle pain/achilles tendonitis- Patient states she wears compression sleeves and uses aspirin, as well as Ketoprofen gel on her ankles which helps. She also had Achilles tendonitis with a nodule noted at last visit. Patient reports that is smaller and her pain has improved. She continues to use nitro patches bilaterally. Of note, she did start wearing high heels again.  3. Right shoulder pain- This is a new complaint. Patient states she did her normal exercises on Sunday with no pain, then woke up the following morning with intense right shoulder pain. She does not remember any specific injury and she has never had this before. She used high doses of aspirin and ice for the following 2 days and pain improved. She states she did not have any arm weakness/numbness/tingling and she was unsure if she had decreased range of motion because it hurt too bad to use her shoulder much. She also used some ketoprofen gel on the lateral shoulder which is where the majority of her pain was located. No redness or swelling. No rashes. No bruising.   She was working on crossover stretches across her chest the day before the pain started.  Review of Systems Negative except as mentioned in HPI above    Objective:   Physical Exam  Gen: Awake, alert. No acute distress. Husband present in exam room  Right Shoulder: Inspection reveals no abnormalities, atrophy or asymmetry. Stable shoulder Palpation is normal with no  tenderness. FROM. Rotator cuff strength normal throughout. No signs of impingement with negative Neer and Hawkin's tests, empty can. Negative Obrien's. Negative lag test. Positive cross over.   Wrists: Inspection normal with no visible erythema or swelling. ROM smooth and normal with good flexion and extension and ulnar/radial deviation that is symmetrical with opposite wrist. No TTP. Strength 5/5 in all directions without pain.  Ankles: No visible erythema or swelling bilaterally. FROM. Negative metatarsal squeeze.  Right achilles tendon with thickening at site of previous nodule. Non-tender Able to walk 4 steps; gait without limp.     Assessment & Plan:

## 2012-04-16 NOTE — Assessment & Plan Note (Signed)
Improved with support and aspirin as needed. Given new supports today which she should wear as needed.

## 2012-04-16 NOTE — Assessment & Plan Note (Signed)
Most likely secondary to arthritis in the [facility] joint exacerbated by exercises and aggressive stretching. She should take a break from using weights and decrease forceful stretching, especially across the body. Continue to do range of motion exercises and add back resistance slowly once pain fully resolves. She can continue using the aspirin as needed, but taper back dosing as tolerated.

## 2012-04-16 NOTE — Assessment & Plan Note (Signed)
Continue her exercise program. Continue a small dose of nitroglycerin on a nodule on the right to see if we can help this further remodel to normal width.

## 2012-04-16 NOTE — Assessment & Plan Note (Signed)
Continue nitro patch on right AT. Use compression sleeve as needed, and with long periods of activity. She can taper off nitro patch on the left ankle arthritis as she desires. Continue ROM exercises.

## 2012-04-16 NOTE — Patient Instructions (Signed)
It was good to see you today.  Your shoulder is most likely due to arthritis. Please take a one week break from using weights. Continue to do range of motion exercises (without resistance.) Also, be careful with stretching across your body.  You should keep using the nitro patch on your right achilles tendon daily, but you can wean off the patch on your left ankle.  For the aspirin, you can safely take 328m two tablets up to four times per day for pain. You should start decreasing this as well as your shoulder improves.  Take care, and Merry Christmas!

## 2012-04-30 DIAGNOSIS — Z8719 Personal history of other diseases of the digestive system: Secondary | ICD-10-CM

## 2012-04-30 HISTORY — DX: Personal history of other diseases of the digestive system: Z87.19

## 2012-05-30 ENCOUNTER — Other Ambulatory Visit: Payer: Self-pay | Admitting: *Deleted

## 2012-05-30 MED ORDER — NITROGLYCERIN 0.2 MG/HR TD PT24: MEDICATED_PATCH | TRANSDERMAL | Status: DC

## 2012-06-05 ENCOUNTER — Telehealth: Payer: Self-pay | Admitting: Family Medicine

## 2012-06-05 ENCOUNTER — Other Ambulatory Visit: Payer: Self-pay | Admitting: *Deleted

## 2012-06-05 MED ORDER — OMEPRAZOLE 20 MG PO CPDR: 20.0000 mg | DELAYED_RELEASE_CAPSULE | Freq: Two times a day (BID) | ORAL | Status: DC

## 2012-06-05 NOTE — Telephone Encounter (Signed)
Called pt and she reports, that she has been taking Prilosec 20 mg bid and needs a refill. I told her, that we would send it to her pharmacy. She has been taking this for several years. Also pt wanted to know, if we can do her colonoscopy here. I told the pt that we do not colonoscopies here. She needs to have her colonoscopy with Dr.Hayes. She has received a letter already from [facility], but really does not want to have colonoscopy done.  Refilled Prilosec today.  Fwd. To Dr.Hale for review. Javier Glazier, Gerrit Heck

## 2012-06-05 NOTE — Telephone Encounter (Signed)
Called pt.

## 2012-06-05 NOTE — Telephone Encounter (Signed)
Patient is wondering if Dr. Walker Kehr can follow her for her stomach problems instead of her going to Dr. Amedeo Plenty.  She has several questions for Dr. Walker Kehr or his nurse.

## 2012-06-18 ENCOUNTER — Other Ambulatory Visit: Payer: Self-pay | Admitting: *Deleted

## 2012-06-18 MED ORDER — KETOPROFEN POWD: Status: DC

## 2012-06-27 ENCOUNTER — Other Ambulatory Visit: Payer: Self-pay | Admitting: Family Medicine

## 2012-06-27 DIAGNOSIS — Z1231 Encounter for screening mammogram for malignant neoplasm of breast: Secondary | ICD-10-CM

## 2012-07-16 ENCOUNTER — Telehealth: Payer: Self-pay | Admitting: Family Medicine

## 2012-07-16 NOTE — Telephone Encounter (Signed)
Pt is getting bad leg cramps after she has any alcohol at night - she is asking for advise (besides quitting drinking) as to what she can do.

## 2012-07-16 NOTE — Telephone Encounter (Signed)
Returned call to patient.  C/o leg cramps "off and on" since 4:00 am this morning.  Patient states she notices the cramps when she drinks alcohol.  Patient typically drinks "5 glasses" 0-2 times per week depending on whether she goes out for dinner or has a dinner party.  Patient drinks "plenty of water" when she drinks wine and sleeps with water at her bedside.  Patient was taking OTC K+ 550 mg daily x 2 months and "didn't have any leg cramps".  Has not had any K+ x 2 weeks and noticed that the cramps returned.  Wants to know if it is ok for her to continue taking OTC K+.  Will route note to Dr. Walker Kehr for advice and call patient back.  Nolene Ebbs, RN

## 2012-07-28 DIAGNOSIS — H532 Diplopia: Secondary | ICD-10-CM | POA: Insufficient documentation

## 2012-08-05 ENCOUNTER — Ambulatory Visit (HOSPITAL_COMMUNITY)
Admission: RE | Admit: 2012-08-05 | Discharge: 2012-08-05 | Disposition: A | Discharge status: Home / Self Care | Payer: Medicare Other | Source: Ambulatory Visit | Attending: Family Medicine | Admitting: Family Medicine

## 2012-08-05 DIAGNOSIS — Z1231 Encounter for screening mammogram for malignant neoplasm of breast: Secondary | ICD-10-CM | POA: Insufficient documentation

## 2012-08-12 ENCOUNTER — Telehealth: Payer: Self-pay | Admitting: Family Medicine

## 2012-08-12 NOTE — Telephone Encounter (Signed)
Patient is calling about an appt for blood work.  Information from [facility] was sent over and put in Dr. Clent Jacks box last week.

## 2012-08-12 NOTE — Telephone Encounter (Signed)
Called pt and left message to call back.  Looked in Dr.Hale's box. Did not see info from [facility]. (Dr.Hale is out of the office until April 21) .[name]

## 2012-08-14 NOTE — Telephone Encounter (Signed)
Paperwork was placed in Hale's box

## 2012-08-14 NOTE — Telephone Encounter (Signed)
Fwd. To Dr.Hale .[name]

## 2012-08-14 NOTE — Telephone Encounter (Signed)
No paperwork seen.  Dr. Walker Kehr to address when he returns.

## 2012-08-26 ENCOUNTER — Encounter: Payer: Self-pay | Admitting: Family Medicine

## 2012-08-26 ENCOUNTER — Ambulatory Visit (INDEPENDENT_AMBULATORY_CARE_PROVIDER_SITE_OTHER): Payer: Medicare (Managed Care) | Admitting: Family Medicine

## 2012-08-26 VITALS — BP 129/69 | HR 61 | Ht 64.5 in | Wt 129.0 lb

## 2012-08-26 DIAGNOSIS — H532 Diplopia: Secondary | ICD-10-CM

## 2012-08-26 NOTE — Patient Instructions (Addendum)
I will send you the results of today's tests   I will be retiring in the summer of this year. Thank you for allowing me to work with you in maintaining your health. Dossie Arbour, MD will be my replacement beginning the second week in August. He is moving to [place] having completed his family medicine residency training in [place], [place]. Please let me know if you would like to be assigned to a different physician.

## 2012-08-27 LAB — TSH: TSH: 1.63 u[IU]/mL (ref 0.350–4.500)

## 2012-08-27 NOTE — Progress Notes (Signed)
  Subjective:    Patient ID: [name], female    DOB: December 04, 1940, 72 y.o.   MRN: [id number]  HPI Diplopia - for the past year she has intermittently noted "divergent" vision while driving to the extent that she feels unsafe driving. It seems to improve after using OTC eye drops like Refresh. She intermittently has allergic rhinitis and Rosacea symptoms.  No dry mouth.  She doesn't note ptosis or weakness that comes later in the day. Otherwise feels well. Her opthalmologist, Dr Kathrin Penner,  referred her to Jolyn Nap, MD, a neuro-opthalmologist at [facility]. She reports he found nothing, but ordered lab tests. She was so aggravated by the system there that she left. I called his office and was told that a thyroid and acetylcholine test had been ordered.    Review of Systems     Objective:   Physical Exam  Constitutional: She is oriented to person, place, and time.  Eyes: Conjunctivae are normal. Pupils are equal, round, and reactive to light. Right eye exhibits no discharge. Left eye exhibits no discharge.  Fundi normal  Neurological: She is alert and oriented to person, place, and time. She has normal reflexes. No cranial nerve deficit. Coordination normal.  Skin:  Mild erythema of cheeks, but no acne or desquamation in the T zone. No inflammation of the eyelids or conjunctivae.   Psychiatric: She has a normal mood and affect. Her behavior is normal.          Assessment & Plan:

## 2012-08-27 NOTE — Assessment & Plan Note (Signed)
Consider early myasthenia. May be coming from cornea edema from allergy or rosacea TSH and binding acetylcholine receptor antibody.

## 2012-08-28 LAB — ACETYLCHOLINE RECEPTOR, BINDING: A CHR BINDING ABS: <0.3 nmol/L (ref <=0.30)

## 2012-08-29 ENCOUNTER — Encounter: Payer: Self-pay | Admitting: Family Medicine

## 2012-08-29 ENCOUNTER — Telehealth: Payer: Self-pay | Admitting: Family Medicine

## 2012-08-29 NOTE — Telephone Encounter (Signed)
Faxed labs to Dr. Shon Hough @ [facility]  and Dr. Jolyn Nap @ [place], [place]

## 2012-09-03 HISTORY — PX: OTHER SURGICAL HISTORY: SHX169

## 2012-09-10 ENCOUNTER — Telehealth: Payer: Self-pay | Admitting: Family Medicine

## 2012-09-10 NOTE — Telephone Encounter (Signed)
Pt is asking to speak to Dr Walker Kehr about the practice -

## 2012-09-15 ENCOUNTER — Encounter: Payer: Self-pay | Admitting: Family Medicine

## 2012-10-03 ENCOUNTER — Other Ambulatory Visit: Payer: Self-pay | Admitting: Family Medicine

## 2012-10-18 ENCOUNTER — Other Ambulatory Visit: Payer: Self-pay | Admitting: Sports Medicine

## 2012-12-25 ENCOUNTER — Other Ambulatory Visit: Payer: 59 | Admitting: Internal Medicine

## 2012-12-25 DIAGNOSIS — Z1329 Encounter for screening for other suspected endocrine disorder: Secondary | ICD-10-CM

## 2012-12-25 DIAGNOSIS — Z130 Encounter for screening for diseases of the blood and blood-forming organs and certain disorders involving the immune mechanism: Secondary | ICD-10-CM

## 2012-12-25 DIAGNOSIS — E785 Hyperlipidemia, unspecified: Secondary | ICD-10-CM

## 2012-12-25 DIAGNOSIS — Z780 Asymptomatic menopausal state: Secondary | ICD-10-CM

## 2012-12-25 DIAGNOSIS — Z79899 Other long term (current) drug therapy: Secondary | ICD-10-CM

## 2012-12-25 LAB — CBC WITH DIFFERENTIAL/PLATELET
Basophils Absolute: 0 10*3/uL (ref 0.0–0.1)
Lymphocytes Relative: 35 % (ref 12–46)
Lymphs Abs: 1.6 10*3/uL (ref 0.7–4.0)
Neutrophils Relative %: 50 % (ref 43–77)
Platelets: 243 10*3/uL (ref 150–400)
RBC: 4.23 MIL/uL (ref 3.87–5.11)
WBC: 4.6 10*3/uL (ref 4.0–10.5)

## 2012-12-25 LAB — COMPREHENSIVE METABOLIC PANEL
ALT: 13 U/L (ref 0–35)
AST: 16 U/L (ref 0–37)
CO2: 26 mEq/L (ref 19–32)
Calcium: 9.9 mg/dL (ref 8.4–10.5)
Chloride: 105 mEq/L (ref 96–112)
Sodium: 142 mEq/L (ref 135–145)
Total Bilirubin: 0.6 mg/dL (ref 0.3–1.2)
Total Protein: 6.8 g/dL (ref 6.0–8.3)

## 2012-12-25 LAB — LIPID PANEL
Cholesterol: 274 mg/dL — ABNORMAL HIGH (ref 0–200)
VLDL: 16 mg/dL (ref 0–40)

## 2012-12-26 ENCOUNTER — Ambulatory Visit (INDEPENDENT_AMBULATORY_CARE_PROVIDER_SITE_OTHER): Payer: 59 | Admitting: Internal Medicine

## 2012-12-26 DIAGNOSIS — Z23 Encounter for immunization: Secondary | ICD-10-CM

## 2012-12-26 LAB — VITAMIN D 25 HYDROXY (VIT D DEFICIENCY, FRACTURES): Vit D, 25-Hydroxy: 94 ng/mL — ABNORMAL HIGH (ref 30–89)

## 2012-12-26 LAB — TSH: TSH: 2.091 u[IU]/mL (ref 0.350–4.500)

## 2012-12-30 ENCOUNTER — Encounter: Payer: Self-pay | Admitting: Internal Medicine

## 2012-12-30 ENCOUNTER — Other Ambulatory Visit (HOSPITAL_COMMUNITY)
Admission: RE | Admit: 2012-12-30 | Discharge: 2012-12-30 | Disposition: A | Discharge status: Home / Self Care | Payer: Medicare Other | Source: Ambulatory Visit | Attending: Internal Medicine | Admitting: Internal Medicine

## 2012-12-30 ENCOUNTER — Ambulatory Visit (INDEPENDENT_AMBULATORY_CARE_PROVIDER_SITE_OTHER): Payer: 59 | Admitting: Internal Medicine

## 2012-12-30 VITALS — BP 118/66 | HR 52 | Temp 99.2°F | Ht 63.75 in | Wt 124.0 lb

## 2012-12-30 DIAGNOSIS — K5900 Constipation, unspecified: Secondary | ICD-10-CM

## 2012-12-30 DIAGNOSIS — Z0000 Encounter for general adult medical examination without abnormal findings: Secondary | ICD-10-CM

## 2012-12-30 DIAGNOSIS — K5909 Other constipation: Secondary | ICD-10-CM

## 2012-12-30 DIAGNOSIS — Z124 Encounter for screening for malignant neoplasm of cervix: Secondary | ICD-10-CM | POA: Insufficient documentation

## 2012-12-30 DIAGNOSIS — I469 Cardiac arrest, cause unspecified: Secondary | ICD-10-CM

## 2012-12-30 DIAGNOSIS — M722 Plantar fascial fibromatosis: Secondary | ICD-10-CM

## 2012-12-30 DIAGNOSIS — K219 Gastro-esophageal reflux disease without esophagitis: Secondary | ICD-10-CM

## 2012-12-30 DIAGNOSIS — E785 Hyperlipidemia, unspecified: Secondary | ICD-10-CM

## 2012-12-30 DIAGNOSIS — I4949 Other premature depolarization: Secondary | ICD-10-CM

## 2012-12-30 LAB — POCT URINALYSIS DIPSTICK
Bilirubin, UA: NEGATIVE
Blood, UA: NEGATIVE
Glucose, UA: NEGATIVE
Ketones, UA: NEGATIVE
Leukocytes, UA: NEGATIVE
Nitrite, UA: NEGATIVE
Protein, UA: NEGATIVE
Spec Grav, UA: 1.01
Urobilinogen, UA: NEGATIVE
pH, UA: 5.5

## 2013-01-15 ENCOUNTER — Encounter: Payer: Self-pay | Admitting: Sports Medicine

## 2013-01-15 ENCOUNTER — Ambulatory Visit (INDEPENDENT_AMBULATORY_CARE_PROVIDER_SITE_OTHER): Payer: Medicare (Managed Care) | Admitting: Sports Medicine

## 2013-01-15 VITALS — Ht 63.75 in | Wt 124.0 lb

## 2013-01-15 DIAGNOSIS — M25579 Pain in unspecified ankle and joints of unspecified foot: Secondary | ICD-10-CM

## 2013-01-15 DIAGNOSIS — M67971 Unspecified disorder of synovium and tendon, right ankle and foot: Secondary | ICD-10-CM

## 2013-01-15 DIAGNOSIS — M6790 Unspecified disorder of synovium and tendon, unspecified site: Secondary | ICD-10-CM

## 2013-01-15 DIAGNOSIS — G8929 Other chronic pain: Secondary | ICD-10-CM

## 2013-01-15 NOTE — Progress Notes (Signed)
Patient ID: [name], female   DOB: 22-Mar-1941, 72 y.o.   MRN: [id number] Is a 72 year old female who presents for followup of bilateral ankle pain. Is a history of a nodule in her right Achilles tendon which has been problematic in the past for which she's currently on nitroglycerin therapy 4. She has a history of left ankle osteoarthritis. She's been wearing compressive sleeves on both ankles and has recently run out of her nitroglycerin. Currently she reports her Achilles tendon is now bothering her she still reports a small bump, however, it is decreased in size significantly over the past year.  She has been exercising with a personal trainer and denies pain.  Past Medical History  Diagnosis Date  . Hyperlipidemia    Past Surgical History  Procedure Laterality Date  . Tubal ligation    . Tonsillectomy    . Esophageal dilation  05/31/1997  . Seborrheic keratosis  09/03/12    inflamed, excised from forehead   Allergies  Allergen Reactions  . Celebrex [Celecoxib]    ROS:  As per HPI, otherwise negative.  Examination: Ht 5' 3.75" (1.619 m)  Wt 124 lb (56.246 kg)  BMI 21.46 kg/m2  This is a WDWN, awake, alert and oriented 72 yo female in NAD.  Ankles : No visible erythema. Range of motion is full in all directions. Strength is 5/5 in all directions. Stable lateral and medial ligaments; squeeze test unremarkable; Talar dome nontender; No pain at base of 5th MT; No tenderness over cuboid; No tenderness on posterior aspects of lateral and medial malleolus. Nodule on left post. Tibial tendon behind medial malleolus. Nontender small nodularity of right achilles at insertion. No sign of peroneal tendon subluxations or tenderness to palpation Negative tarsal tunnel tinel's Able to walk 4 steps.  N/V intact.

## 2013-01-15 NOTE — Assessment & Plan Note (Signed)
Advised her to continue wearing compression sleeve on the left ankle today, due to her osteoarthritis. Also advised her not to do one legged step ups on the left leg.

## 2013-01-15 NOTE — Assessment & Plan Note (Signed)
No tenderness today. She can discontinue the nitroglycerin therapy. She may also discontinue the compression sleeve and only wear as needed. We instructed her to continue HEP for Achilles tendon strengthening.

## 2013-01-29 ENCOUNTER — Encounter: Payer: Self-pay | Admitting: Internal Medicine

## 2013-01-29 DIAGNOSIS — I469 Cardiac arrest, cause unspecified: Secondary | ICD-10-CM | POA: Insufficient documentation

## 2013-01-29 NOTE — Progress Notes (Signed)
Subjective:    Patient ID: [name], female    DOB: [birth year], 72 y.o.   MRN: [id number]  HPI  First visit for this 72 year old White female previously seen by Dr. Candelaria Celeste at [facility] who is retiring. Patient has no history of hospitalizations other than childbirth. No history of accidents or operations. She has a history of environmental allergies. Some minor issues with constipation for which she takes MiraLAX 2 times weekly. Sees Dr. Andreas Blower for athletic injuries. 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 Takes aspirin 2 or 3 times weekly. History of plantar fasciitis.  Social history: She is married to Shelly Flatten, well-known poet and Probation officer. She  has a 4 year college degree and is helping him travel around with book signings for new poetry book. He no longer drives. Sometimes she reads poetry along with him at books signings. One adult son who lives in [place] and is a Therapist, nutritional. Daily social alcohol consumption. Does not smoke. Native of  [place], [place].  Family history: Father died at age 75 with complications of emphysema. Mother died at age 27 of heart failure. No brothers or sisters.   Has seen Dr. Teena Irani for GI issues  Pneumonia vaccine 02/28/2005, Zostavax vaccine done in 2008    Review of Systems  Constitutional: Negative.   HENT:       Tinnitus  Eyes: Negative.   Respiratory: Negative.   Cardiovascular: Negative.   Gastrointestinal:       Chronic constipation with for which she takes MiraLAX  Endocrine:       Night sweats  Genitourinary: Negative.   Allergic/Immunologic: Positive for environmental allergies.  Neurological: Negative.   Hematological: Negative.   Psychiatric/Behavioral: Negative.        Objective:   Physical Exam  Nursing note and vitals reviewed. Constitutional: She is oriented to person, place, and time. She appears well-developed and well-nourished. No distress.  HENT:  Head: Normocephalic  and atraumatic.  Right Ear: External ear normal.  Left Ear: External ear normal.  Mouth/Throat: Oropharynx is clear and moist. No oropharyngeal exudate.  Eyes: Conjunctivae and EOM are normal. Pupils are equal, round, and reactive to light. Right eye exhibits no discharge. Left eye exhibits no discharge. No scleral icterus.  Neck: Neck supple. No JVD present. No thyromegaly present.  Cardiovascular: Normal rate and intact distal pulses.   No murmur heard. Frequent extrasystoles  Pulmonary/Chest: Effort normal and breath sounds normal. No respiratory distress. She has no wheezes. She has no rales. She exhibits no tenderness.  Breasts normal female  Abdominal: Soft. Bowel sounds are normal.  Genitourinary:  Pap taken  Musculoskeletal: Normal range of motion. She exhibits no edema.  Lymphadenopathy:    She has no cervical adenopathy.  Neurological: She is alert and oriented to person, place, and time. She has normal reflexes. No cranial nerve deficit. Coordination normal.  Skin: Skin is warm and dry. No rash noted. She is not diaphoretic.  Psychiatric: She has a normal mood and affect. Her behavior is normal. Judgment and thought content normal.          Assessment & Plan:  History of plantar fasciitis- treated by Dr. Oneida Alar in sports medicine  History of chronic constipation  Tinnitus   ? Cardiac dysrhythmia-EKG shows sinus bradycardia with no  Arrythmia  but has extrasystoles onexam  Plan: Return in one year or as needed. Recommend annual influenza immunization,  has upcoming colonoscopy scheduled with Dr. Amedeo Plenty,  annual mammogram.  Subjective:   Patient presents for Medicare Annual/Subsequent preventive examination.   Review Past Medical/Family/Social: see above    Risk Factors  Current exercise habits: has trainer and works out daily Dietary issues discussed: low fat low carb  Cardiac risk factors:  Depression Screen  (Note: if answer to either of the following is  "Yes", a more complete depression screening is indicated)   Over the past two weeks, have you felt down, depressed or hopeless? No  Over the past two weeks, have you felt little interest or pleasure in doing things? No Have you lost interest or pleasure in daily life? No Do you often feel hopeless? No Do you cry easily over simple problems? No   Activities of Daily Living  In your present state of health, do you have any difficulty performing the following activities?:   Driving? No  Managing money? No  Feeding yourself? No  Getting from bed to chair? No  Climbing a flight of stairs? No  Preparing food and eating?: No  Bathing or showering? No  Getting dressed: No  Getting to the toilet? No  Using the toilet:No  Moving around from place to place: No  In the past year have you fallen or had a near fall?:No  Are you sexually active? No  Do you have more than one partner? No   Hearing Difficulties: No  Do you often ask people to speak up or repeat themselves? No  Do you experience ringing or noises in your ears? No  Do you have difficulty understanding soft or whispered voices? No  Do you feel that you have a problem with memory? No Do you often misplace items? No    Home Safety:  Do you have a smoke alarm at your residence? Yes Do you have grab bars in the bathroom?- did not ask Do you have throw rugs in your house?- did not ask   Cognitive Testing  Alert? Yes Normal Appearance?Yes  Oriented to person? Yes Place? Yes  Time? Yes  Recall of three objects? Yes  Can perform simple calculations? Yes  Displays appropriate judgment?Yes  Can read the correct time from a watch face?Yes   List the Names of Other Physician/Practitioners you currently use:  See referral list for the physicians patient is currently seeing. Dr. Andreas Blower, Dr. Teena Irani    Review of Systems: see above   Objective:     General appearance: Appears stated age and mildly obese  Head:  Normocephalic, without obvious abnormality, atraumatic  Eyes: conj clear, EOMi PEERLA  Ears: normal TM's and external ear canals both ears  Nose: Nares normal. Septum midline. Mucosa normal. No drainage or sinus tenderness.  Throat: lips, mucosa, and tongue normal; teeth and gums normal  Neck: no adenopathy, no carotid bruit, no JVD, supple, symmetrical, trachea midline and thyroid not enlarged, symmetric, no tenderness/mass/nodules  No CVA tenderness.  Lungs: clear to auscultation bilaterally  Breasts: normal appearance, no masses or tenderness Heart: regular rate and rhythm, S1, S2 normal, no murmur, click, rub or gallop  Abdomen: soft, non-tender; bowel sounds normal; no masses, no organomegaly  Musculoskeletal: ROM normal in all joints, no crepitus, no deformity, Normal muscle strengthen. Back  is symmetric, no curvature. Skin: Skin color, texture, turgor normal. No rashes or lesions  Lymph nodes: Cervical, supraclavicular, and axillary nodes normal.  Neurologic: CN 2 -12 Normal, Normal symmetric reflexes. Normal coordination and gait  Psych: Alert & Oriented x 3, Mood appear stable.  Assessment:    Annual wellness medicare exam   Plan:    During the course of the visit the patient was educated and counseled about appropriate screening and preventive services including:   Colonoscopy soon with Dr. Amedeo Plenty Had flu vaccine already Annual Mammogram     Patient Instructions (the written plan) was given to the patient.  Medicare Attestation  I have personally reviewed:  The patient's medical and social history  Their use of alcohol, tobacco or illicit drugs  Their current medications and supplements  The patient's functional ability including ADLs,fall risks, home safety risks, cognitive, and hearing and visual impairment  Diet and physical activities  Evidence for depression or mood disorders  The patient's weight, height, BMI, and visual acuity have been recorded in the  chart. I have made referrals, counseling, and provided education to the patient based on review of the above and I have provided the patient with a written personalized care plan for preventive services.

## 2013-02-01 NOTE — Patient Instructions (Addendum)
Return in one year or as needed.

## 2013-03-10 ENCOUNTER — Encounter: Payer: Self-pay | Admitting: Sports Medicine

## 2013-03-10 ENCOUNTER — Ambulatory Visit (INDEPENDENT_AMBULATORY_CARE_PROVIDER_SITE_OTHER): Payer: Medicare Other | Admitting: Sports Medicine

## 2013-03-10 VITALS — BP 130/75 | Ht 63.0 in | Wt 124.0 lb

## 2013-03-10 DIAGNOSIS — M79609 Pain in unspecified limb: Secondary | ICD-10-CM

## 2013-03-10 DIAGNOSIS — M722 Plantar fascial fibromatosis: Secondary | ICD-10-CM

## 2013-03-10 DIAGNOSIS — M7750 Other enthesopathy of unspecified foot: Secondary | ICD-10-CM

## 2013-03-10 DIAGNOSIS — M25579 Pain in unspecified ankle and joints of unspecified foot: Secondary | ICD-10-CM

## 2013-03-10 DIAGNOSIS — G8929 Other chronic pain: Secondary | ICD-10-CM

## 2013-03-10 NOTE — Assessment & Plan Note (Signed)
This seems in control by using orthotics

## 2013-03-10 NOTE — Assessment & Plan Note (Signed)
Now with. metatarsal pads in all shoes and these are helping

## 2013-03-10 NOTE — Progress Notes (Signed)
Patient ID: [name], female   DOB: 04-Apr-1941, 72 y.o.   MRN: [id number]  Patient with chronic foot and ankle pain  She has had chronic metatarsal area pain She has atrophy of the fat pad across the metatarsals bilaterally Some loss of the transverse arch  In addition she has chronic ankle pain on the left from some degenerative arthritis She does get swelling and pain if she stands too long  Orthotics have made her able to exercise and walk particularly in exercise shoes  She is having a good deal of pain in regular shoes and comes to see if we can place a type of orthotic in her day-to-day shoes  Examination No acute distress  thin feet bilaterally  Transverse arch collapse bilaterally  Calluses over metatarsal heads and there is mild palpable tenderness at the metatarsal heads  Left ankle shows some mild swelling There is decreased range of motion  Right Achilles shows a large nodule 2 to 4 cm above the calcaneal insertion

## 2013-03-10 NOTE — Assessment & Plan Note (Signed)
Chronic arthritis of the left ankle  Continue orthotic support  Continue ankle compression

## 2013-03-10 NOTE — Assessment & Plan Note (Signed)
Patient was fitted for a : standard, cushioned, semi-rigid orthotic. The orthotic was heated and afterward the patient stood on the orthotic blank positioned on the orthotic stand. The patient was positioned in subtalar neutral position and 10 degrees of ankle dorsiflexion in a weight bearing stance. After completion of molding, a stable base was applied to the orthotic blank. The blank was ground to a stable position for weight bearing. Size: 7 dress orthotic Base: blue foam Posting: MT pads -extra small Additional orthotic padding: none   placed into her dress shoes They do support her arch and her gait is neutral  Prep time 50 minutes

## 2013-04-02 ENCOUNTER — Encounter: Payer: Self-pay | Admitting: Sports Medicine

## 2013-04-02 ENCOUNTER — Ambulatory Visit (INDEPENDENT_AMBULATORY_CARE_PROVIDER_SITE_OTHER): Payer: Medicare Other | Admitting: Sports Medicine

## 2013-04-02 VITALS — BP 113/65

## 2013-04-02 DIAGNOSIS — M25539 Pain in unspecified wrist: Secondary | ICD-10-CM

## 2013-04-02 DIAGNOSIS — M25531 Pain in right wrist: Secondary | ICD-10-CM | POA: Insufficient documentation

## 2013-04-02 NOTE — Progress Notes (Signed)
Patient ID: [name], female   DOB: [birth year], 72 y.o.   MRN: [id number]  Rt hand and wrist pain for at least 2 yrs. We treated on several visits primarily w soft wrist loop and more supportive brace In past used some ketoprofen gel w some help ASA 3 in morning and 2 mid day and 3 at night seem to help Icing does not change pain level but doing a lot of this  Not really stiff in mornings Pain is key limiting factor  For 4 weeks a lump on dorsum of RT wrist and this has been making fingers tighter to move  Examination Older women who is thin and in NAD  Large swollen soft tissue mass over dorsum of proximal carpal row RT hand Wrist extension is 75 left and only 50 on RT Flexion 75 on LT but 65 on RT Good ulnar and radial deviation  Hands show bilat CMC DJD changes but remainder not much change  [place] Marked tenosynovitis of 4th dorsal cmpt of RT wirst Other compts look normal CMC bilat shows DJD

## 2013-04-02 NOTE — Assessment & Plan Note (Signed)
This is not that painful Will keep up ASA  Compression wrap of RT wrist - order f body helix  Icing  Reck in 1 month  Avoid activities as discussed

## 2013-04-02 NOTE — Patient Instructions (Signed)
Torn sheath that surrounds tendons of dorsal compartment 4 of wrist  Needs to use compression sleeve on wrist - body helix  Avoid exercises for forearms - wrist extensions, wrist rolls, wrist flexions  OK to hold light dumbell - fist up - and do some biceps and triceps and chest - prob  2 to 3 lbs.  Don't do any pushup or support of body weight with RT wrist  I need to recheck this in 1 month

## 2013-04-07 ENCOUNTER — Encounter: Payer: Self-pay | Admitting: Sports Medicine

## 2013-04-07 ENCOUNTER — Ambulatory Visit (INDEPENDENT_AMBULATORY_CARE_PROVIDER_SITE_OTHER): Payer: Medicare Other | Admitting: Sports Medicine

## 2013-04-07 VITALS — BP 126/71 | HR 63 | Ht 63.0 in | Wt 124.0 lb

## 2013-04-07 DIAGNOSIS — M25539 Pain in unspecified wrist: Secondary | ICD-10-CM

## 2013-04-07 DIAGNOSIS — M25531 Pain in right wrist: Secondary | ICD-10-CM

## 2013-04-07 NOTE — Assessment & Plan Note (Addendum)
Patient with 4th cmpt tenosynovitis.  Patient now with nerve injury that we suspect arose from combination of too much icing and maybe the swelling.  Now with 4th/5th digit drop. Patient advised to use older compression sleeve today - she has used this one and it is comfortable.  velcro closure.. Advised fish oil and Vitamin B6. No ice to affected area. Follow up in 2 weeks.

## 2013-04-07 NOTE — Progress Notes (Signed)
   Subjective:    Patient ID: [name], female    DOB: Oct 16, 1940, 72 y.o.   MRN: [id number]  HPI [name] is a 72 year old female who presents for follow up regarding her right wrist.  She was seen previously on 12/4.  [place] at that time revealed marked tenosynovitis of 4th dorsal compartment.   Patient reports that on Friday (12/5), she "jammed" her wrist while trying to put on her compression sleeve - as the sleeve she was trying was too tight.  After she did this, she had severe pain requiring her to take Hydrocodone. She had more swelling for 36 hours and used a lot of ice.  She had improvement in her pain.  However, she subsequently noticed that her 4-5 digits were drooping.   Since that time she had significant limitation in her normal activities: having difficulty with even putting on makeup.  Review of Systems Per HPI    Objective:   Physical Exam Filed Vitals:   04/07/13 0858  BP: 126/71  Pulse: 63   General: well appearing elderly lady in NAD. MSK:  Large swollen soft tissue mass over dorsum of the right hand. Drop of last 2 digits noted.  Poor extension of 4th and 5th digits.  Good flexion.  Good hand grip strength bilaterally.  Wrist extension - ~ 75 degrees Left; 45 degrees RT Flexion - 75 degrees Left; ~60 degrees right Good ulnar and radial deviation.    Assessment & Plan:  See Problem List

## 2013-04-07 NOTE — Patient Instructions (Signed)
Please use your neoprene sleeve for compression.  Take Vitamin B6 50 mg twice daily.  Also take fish oil (1 gram) daily.   Follow up in 2 weeks.

## 2013-04-21 ENCOUNTER — Encounter: Payer: Self-pay | Admitting: Sports Medicine

## 2013-04-21 ENCOUNTER — Ambulatory Visit (INDEPENDENT_AMBULATORY_CARE_PROVIDER_SITE_OTHER): Payer: Medicare Other | Admitting: Sports Medicine

## 2013-04-21 VITALS — BP 120/71 | Ht 64.0 in | Wt 121.0 lb

## 2013-04-21 DIAGNOSIS — M25531 Pain in right wrist: Secondary | ICD-10-CM

## 2013-04-21 DIAGNOSIS — M25539 Pain in unspecified wrist: Secondary | ICD-10-CM

## 2013-04-21 MED ORDER — TRIAMCINOLONE ACETONIDE 10 MG/ML IJ SUSP
10.0000 mg | Freq: Once | INTRAMUSCULAR | Status: AC
  Administered 2013-04-21: 10 mg via INTRA_ARTICULAR

## 2013-04-21 NOTE — Assessment & Plan Note (Signed)
I am concerned with loss of active extension  CSI today Splinting Avoid ice because of finger drop/ ? If this could be cryoinjury See how this responds over next 2 weeks

## 2013-04-21 NOTE — Progress Notes (Signed)
Patient ID: [name], female   DOB: 1940/10/18, 72 y.o.   MRN: [id number]  Patient with large swollen dorsal 4th compartment on RT wrist She developed weakness in extension of 4th and 5th fingers on that hand This came after doing exercises while using a wrist splint  Compression at first painful  Now with light compression and wrist feels better but no improvement in movement of fingers 4 and 5  Exam NAD BP 120/71  Ht 5' 4"  (1.626 m)  Wt 121 lb (54.885 kg)  BMI 20.76 kg/m2  Dorsum of RT wrist shows swelling over mid portion Good movement in flexion and ext of thumb and fingers 2/3 Fingers 4/5 show flexion and lack of active extension Remainder of wrist is nontender  [place] Marked swelling along the entire sheath of 4th dorsal wrist cmpt  Procedure note Procedure:  Injection of RT dorsal wrist Consent obtained and verified. Time-out conducted. Noted no overlying erythema, induration, or other signs of local infection. Skin prepped in a sterile fashion. Topical analgesic spray: Ethyl chloride. Completed without difficulty. 1% lidocaine wheal Injected after this with 1 cc kenalog 10 and 1 cc lidocaine/ sheath punctured 2 to 3 times into swollen areas   Visualized with [place] suggesting accurate placement of the medication. Advised to call if fevers/chills, erythema, induration, drainage, or persistent bleeding.  Mild massage of fluid at completion  Note also scanned left forearm where she appears to have a 2 x 1.7 cm lipoma

## 2013-04-29 ENCOUNTER — Encounter: Payer: Self-pay | Admitting: Internal Medicine

## 2013-04-29 ENCOUNTER — Ambulatory Visit (INDEPENDENT_AMBULATORY_CARE_PROVIDER_SITE_OTHER): Payer: 59 | Admitting: Internal Medicine

## 2013-04-29 VITALS — BP 134/76 | Temp 99.1°F | Wt 125.0 lb

## 2013-04-29 DIAGNOSIS — M899 Disorder of bone, unspecified: Secondary | ICD-10-CM

## 2013-04-29 DIAGNOSIS — M659 Synovitis and tenosynovitis, unspecified: Secondary | ICD-10-CM

## 2013-04-29 DIAGNOSIS — M65839 Other synovitis and tenosynovitis, unspecified forearm: Secondary | ICD-10-CM

## 2013-04-29 DIAGNOSIS — M8580 Other specified disorders of bone density and structure, unspecified site: Secondary | ICD-10-CM

## 2013-04-29 DIAGNOSIS — M19049 Primary osteoarthritis, unspecified hand: Secondary | ICD-10-CM

## 2013-04-29 NOTE — Patient Instructions (Signed)
Appointment with Dr. Daylene Katayama May 04, 2013

## 2013-04-29 NOTE — Progress Notes (Signed)
   Subjective:    Patient ID: [name], female    DOB: 04/21/1941, 72 y.o.   MRN: [id number]  HPI 72 year old White female with pain and swelling right hand dorsal aspect along mid hand.  Pt has had injection of swollen area by Dr. Oneida Alar Dec 23 with Kenalog and Lidocaine. Patient thinks issue was made worse by wearing forearm splint.  This week, having trouble with right third finger flexion at times. Has seen Dr. Andreas Blower on Dec 4, 9 and 23. See notes. Patient may have aggravated this by doing some exercises with trainer although she says she was not doing any weight bearing with this hand. Had ultrasound Dec 4 showing marked tenosynovitis 4th compartment. This am, struck hand accidentally and had considerable pain. Is frustrated she has not gotten better. Called here for appointment today.    Review of Systems     Objective:   Physical Exam Visible swelling mid third metacarpal dorsal aspect. Area has slight increased warmth and tenderness. Heberden's and Bouchard's nodes right hand        Assessment & Plan:  Tenosynovitis right hand  Possible nerve injury  Osteoarthritis right hand  History of osteopenia  Plan: Appointment with Dr. Daylene Katayama  January 5th, 2015. Advise no exercising until seen. Says ice made symptoms worse. May try moist heat. Took some of husband's hydrocodone for pain.

## 2013-05-05 ENCOUNTER — Ambulatory Visit (INDEPENDENT_AMBULATORY_CARE_PROVIDER_SITE_OTHER): Payer: Medicare Other | Admitting: Sports Medicine

## 2013-05-05 ENCOUNTER — Encounter: Payer: Self-pay | Admitting: Sports Medicine

## 2013-05-05 ENCOUNTER — Other Ambulatory Visit: Payer: Self-pay | Admitting: Orthopedic Surgery

## 2013-05-05 VITALS — BP 128/77 | Ht 64.0 in | Wt 121.0 lb

## 2013-05-05 DIAGNOSIS — M25539 Pain in unspecified wrist: Secondary | ICD-10-CM

## 2013-05-05 DIAGNOSIS — M25531 Pain in right wrist: Secondary | ICD-10-CM

## 2013-05-05 NOTE — Progress Notes (Signed)
Patient ID: [name], female   DOB: 1941-04-21, 73 y.o.   MRN: [id number]  Here for advice Since last vist saw Dr Daylene Katayama He found a spur on Xray and feels she has probably ruptured extensor tendons in fourth cmpt That would be consistent with her loss of extension of digits 4 and 5 He recommended surgery  On last vist we did injection Swelling went down but no improvement in motion or ability to extend fingers  Exam  NAD Much less puffiness over dorsum of wrist Still unable to extend fingers 4 and 5  [place] repeat Still swelling in cmpt 4 Spur noted as on XRay Extending further up wrist I do think there is high grade partial tear of extensor 4 3 and 5 seems to have most fibers intact but some swelling

## 2013-05-05 NOTE — Assessment & Plan Note (Signed)
I D/w patient  I agree with Dr Daylene Katayama that this is likely tear of 1 or more extensor tendons  I think surgery sounds like a reasonable suggestion and encouraged her to follow his suggestion

## 2013-05-06 ENCOUNTER — Ambulatory Visit: Payer: Medicare Other | Admitting: Sports Medicine

## 2013-05-06 ENCOUNTER — Telehealth: Payer: Self-pay | Admitting: Internal Medicine

## 2013-05-06 MED ORDER — AZITHROMYCIN 250 MG PO TABS: ORAL_TABLET | ORAL | Status: DC

## 2013-05-06 NOTE — Telephone Encounter (Signed)
[name] has to have hand surgery next week per Dr. Daylene Katayama. She and husband have URI symptoms. Call in [place] for her.

## 2013-05-11 ENCOUNTER — Encounter (HOSPITAL_BASED_OUTPATIENT_CLINIC_OR_DEPARTMENT_OTHER): Payer: Self-pay | Admitting: *Deleted

## 2013-05-11 NOTE — Progress Notes (Signed)
Has been seeing sports med-husband does not drive-someone coming with them- Has a cold-got a Zpac just to prevent any other illness

## 2013-05-13 NOTE — H&P (Signed)
[name] is an 73 y.o. female.   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   Beginning in early December she noted swelling and weakness after an injury while working out.  She works out on a regular basis including floor exercises.  She is a sports medicine patient of Andreas Blower.  Dr. Oneida Alar recommended a compression wrap and protection of her hand.    While applying a compression wrap she strained her ring and small fingers and since that time notes that she has had an extensor lag.    She has noted a soft swelling over the dorsal aspect of her distal ulna in the region of her ring and small finger extensor tendons consistent with synovial fluid collection.  She now presents for an upper extremity orthopaedic   Past Medical History  Diagnosis Date  . Hyperlipidemia   . Arthritis   . Wears glasses   . GERD (gastroesophageal reflux disease)   . Chronic constipation     Past Surgical History  Procedure Laterality Date  . Tubal ligation    . Tonsillectomy    . Esophageal dilation  05/31/1997  . Seborrheic keratosis  09/03/12    inflamed, excised from forehead  . Dilation and curettage of uterus  2009  . Colonoscopy      History reviewed. No pertinent family history. Social History:  reports that she has never smoked. She has never used smokeless tobacco. She reports that she drinks about 10.5 ounces of alcohol per week. She reports that she does not use illicit drugs.  Allergies:  Allergies  Allergen Reactions  . Celebrex [Celecoxib] Nausea And Vomiting    No prescriptions prior to admission    No results found for this or any previous visit (from the past 48 hour(s)).  No results found.   Pertinent items are noted in  HPI.  Height 5' 4"  (1.626 m), weight 54.432 kg (120 lb).  General appearance: alert Head: Normocephalic, without obvious abnormality Neck: supple, symmetrical, trachea midline Resp: clear to auscultation bilaterally Cardio: normal apical impulse GI: normal findings: bowel sounds normal Extremities:   Inspection of her right and left hands reveals prominence of the distal ulna bilaterally. She has extensor lag 70 degrees of her right ring and small fingers and full extension of her thumb, index and long fingers.  She has synovial fluid percolating beneath the skin overlying her ulnar head and along the pathway of the extensor digiti minimi and extensor digitorum longus to the ring and small fingers. 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  She has full range of motion of her IP joints right and left. She has no sign of extensor tendon impairment on the left.   Four views of her right wrist are obtained.  She is noted to have bone-on-bone arthritis at the distal radial ulnar joint. She is ulnar minus by 4 mm., she has advanced CMC arthrosis.   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   Plan: To the OR for resection right ulna head with EIP transfer to right ring and small fingers and retinacular transfer right wrist.The procedure, risks,benefits and post-op course were discussed with the patient at length and they were in agreement with the plan.  DASNOIT,[name] J 05/13/2013, 8:29 PM   H&P documentation: 05/14/2013  -History and  Physical Reviewed  -Patient has been re-examined  -No change in the plan of care  Cammie Sickle, MD

## 2013-05-14 ENCOUNTER — Ambulatory Visit (HOSPITAL_BASED_OUTPATIENT_CLINIC_OR_DEPARTMENT_OTHER)
Admission: RE | Admit: 2013-05-14 | Discharge: 2013-05-14 | Disposition: A | Discharge status: Home / Self Care | Payer: Medicare Other | Source: Ambulatory Visit | Attending: Orthopedic Surgery | Admitting: Orthopedic Surgery

## 2013-05-14 ENCOUNTER — Encounter (HOSPITAL_BASED_OUTPATIENT_CLINIC_OR_DEPARTMENT_OTHER)
Admission: RE | Disposition: A | Discharge status: Home / Self Care | Payer: Self-pay | Source: Ambulatory Visit | Attending: Orthopedic Surgery

## 2013-05-14 ENCOUNTER — Ambulatory Visit (HOSPITAL_BASED_OUTPATIENT_CLINIC_OR_DEPARTMENT_OTHER): Payer: Medicare Other | Admitting: Anesthesiology

## 2013-05-14 ENCOUNTER — Encounter (HOSPITAL_BASED_OUTPATIENT_CLINIC_OR_DEPARTMENT_OTHER): Payer: Self-pay | Admitting: *Deleted

## 2013-05-14 ENCOUNTER — Encounter (HOSPITAL_BASED_OUTPATIENT_CLINIC_OR_DEPARTMENT_OTHER): Payer: Medicare Other | Admitting: Anesthesiology

## 2013-05-14 DIAGNOSIS — IMO0002 Reserved for concepts with insufficient information to code with codable children: Secondary | ICD-10-CM | POA: Insufficient documentation

## 2013-05-14 DIAGNOSIS — M19039 Primary osteoarthritis, unspecified wrist: Secondary | ICD-10-CM | POA: Insufficient documentation

## 2013-05-14 DIAGNOSIS — M66239 Spontaneous rupture of extensor tendons, unspecified forearm: Secondary | ICD-10-CM | POA: Insufficient documentation

## 2013-05-14 DIAGNOSIS — K219 Gastro-esophageal reflux disease without esophagitis: Secondary | ICD-10-CM | POA: Insufficient documentation

## 2013-05-14 DIAGNOSIS — M659 Unspecified synovitis and tenosynovitis, unspecified site: Secondary | ICD-10-CM | POA: Insufficient documentation

## 2013-05-14 DIAGNOSIS — M66249 Spontaneous rupture of extensor tendons, unspecified hand: Principal | ICD-10-CM | POA: Insufficient documentation

## 2013-05-14 DIAGNOSIS — E785 Hyperlipidemia, unspecified: Secondary | ICD-10-CM | POA: Insufficient documentation

## 2013-05-14 HISTORY — DX: Other constipation: K59.09

## 2013-05-14 HISTORY — PX: ULNAR HEAD EXCISION: SHX6344

## 2013-05-14 HISTORY — PX: REPAIR EXTENSOR TENDON: SHX5382

## 2013-05-14 HISTORY — DX: Gastro-esophageal reflux disease without esophagitis: K21.9

## 2013-05-14 HISTORY — DX: Presence of spectacles and contact lenses: Z97.3

## 2013-05-14 LAB — POCT HEMOGLOBIN-HEMACUE: Hemoglobin: 13.4 g/dL (ref 12.0–15.0)

## 2013-05-14 SURGERY — EXCISION, ULNA, HEAD
Anesthesia: General | Site: Wrist | Laterality: Right

## 2013-05-14 MED ORDER — CEPHALEXIN 500 MG PO CAPS: 500.0000 mg | ORAL_CAPSULE | Freq: Three times a day (TID) | ORAL | Status: DC

## 2013-05-14 MED ORDER — OXYCODONE HCL 5 MG/5ML PO SOLN: 5.0000 mg | Freq: Once | ORAL | Status: DC | PRN

## 2013-05-14 MED ORDER — HYDROMORPHONE HCL PF 1 MG/ML IJ SOLN
INTRAMUSCULAR | Status: AC
  Filled 2013-05-14: qty 1

## 2013-05-14 MED ORDER — MIDAZOLAM HCL 5 MG/5ML IJ SOLN
INTRAMUSCULAR | Status: DC | PRN
  Administered 2013-05-14: 2 mg via INTRAVENOUS

## 2013-05-14 MED ORDER — ONDANSETRON HCL 4 MG/2ML IJ SOLN
INTRAMUSCULAR | Status: DC | PRN
  Administered 2013-05-14: 4 mg via INTRAVENOUS

## 2013-05-14 MED ORDER — HYDROMORPHONE HCL PF 1 MG/ML IJ SOLN
0.2500 mg | INTRAMUSCULAR | Status: DC | PRN
  Administered 2013-05-14 (×4): 0.5 mg via INTRAVENOUS
  Filled 2013-05-14: qty 1

## 2013-05-14 MED ORDER — LACTATED RINGERS IV SOLN
INTRAVENOUS | Status: DC
  Administered 2013-05-14 (×3): via INTRAVENOUS

## 2013-05-14 MED ORDER — FENTANYL CITRATE 0.05 MG/ML IJ SOLN
INTRAMUSCULAR | Status: DC | PRN
  Administered 2013-05-14: 100 ug via INTRAVENOUS

## 2013-05-14 MED ORDER — HYDROMORPHONE HCL PF 1 MG/ML IJ SOLN
0.5000 mg | INTRAMUSCULAR | Status: DC | PRN
  Administered 2013-05-14: 0.5 mg via INTRAVENOUS

## 2013-05-14 MED ORDER — MIDAZOLAM HCL 2 MG/2ML IJ SOLN: 1.0000 mg | INTRAMUSCULAR | Status: DC | PRN

## 2013-05-14 MED ORDER — LIDOCAINE HCL 2 % IJ SOLN
INTRAMUSCULAR | Status: DC | PRN
  Administered 2013-05-14: 2.5 mL

## 2013-05-14 MED ORDER — CEFAZOLIN SODIUM-DEXTROSE 2-3 GM-% IV SOLR
INTRAVENOUS | Status: DC | PRN
  Administered 2013-05-14: 2 g via INTRAVENOUS

## 2013-05-14 MED ORDER — FENTANYL CITRATE 0.05 MG/ML IJ SOLN
50.0000 ug | INTRAMUSCULAR | Status: DC | PRN
Start: 2013-05-14 — End: 2013-05-14

## 2013-05-14 MED ORDER — CHLORHEXIDINE GLUCONATE 4 % EX LIQD: 60.0000 mL | Freq: Once | CUTANEOUS | Status: DC

## 2013-05-14 MED ORDER — SUCCINYLCHOLINE CHLORIDE 20 MG/ML IJ SOLN
INTRAMUSCULAR | Status: DC | PRN
  Administered 2013-05-14: 100 mg via INTRAVENOUS

## 2013-05-14 MED ORDER — MIDAZOLAM HCL 2 MG/2ML IJ SOLN
0.5000 mg | Freq: Once | INTRAMUSCULAR | Status: AC | PRN
  Administered 2013-05-14: 1 mg via INTRAVENOUS

## 2013-05-14 MED ORDER — OXYCODONE HCL 5 MG PO TABS: 5.0000 mg | ORAL_TABLET | Freq: Once | ORAL | Status: DC | PRN

## 2013-05-14 MED ORDER — PROMETHAZINE HCL 25 MG/ML IJ SOLN: 6.2500 mg | INTRAMUSCULAR | Status: DC | PRN

## 2013-05-14 MED ORDER — MIDAZOLAM HCL 2 MG/2ML IJ SOLN
INTRAMUSCULAR | Status: AC
  Filled 2013-05-14: qty 2

## 2013-05-14 MED ORDER — DEXAMETHASONE SODIUM PHOSPHATE 4 MG/ML IJ SOLN
INTRAMUSCULAR | Status: DC | PRN
  Administered 2013-05-14: 10 mg via INTRAVENOUS

## 2013-05-14 MED ORDER — MEPERIDINE HCL 25 MG/ML IJ SOLN: 6.2500 mg | INTRAMUSCULAR | Status: DC | PRN

## 2013-05-14 MED ORDER — PROPOFOL 10 MG/ML IV BOLUS
INTRAVENOUS | Status: DC | PRN
  Administered 2013-05-14 (×2): 100 mg via INTRAVENOUS

## 2013-05-14 MED ORDER — FENTANYL CITRATE 0.05 MG/ML IJ SOLN
INTRAMUSCULAR | Status: AC
  Filled 2013-05-14: qty 6

## 2013-05-14 MED ORDER — OXYCODONE-ACETAMINOPHEN 5-325 MG PO TABS: ORAL_TABLET | ORAL | Status: DC

## 2013-05-14 SURGICAL SUPPLY — 109 items
BAG DECANTER FOR FLEXI CONT (MISCELLANEOUS) IMPLANT
BANDAGE ADH SHEER 1  50/CT (GAUZE/BANDAGES/DRESSINGS) IMPLANT
BANDAGE COBAN STERILE 2 (GAUZE/BANDAGES/DRESSINGS) ×2 IMPLANT
BANDAGE ELASTIC 3 VELCRO ST LF (GAUZE/BANDAGES/DRESSINGS) ×4 IMPLANT
BANDAGE ELASTIC 4 VELCRO ST LF (GAUZE/BANDAGES/DRESSINGS) IMPLANT
BLADE AVERAGE 25MMX9MM (BLADE)
BLADE AVERAGE 25X9 (BLADE) IMPLANT
BLADE MINI RND TIP GREEN BEAV (BLADE) ×4 IMPLANT
BLADE SURG 15 STRL LF DISP TIS (BLADE) ×4 IMPLANT
BLADE SURG 15 STRL SS (BLADE) ×4
BNDG CMPR 9X4 STRL LF SNTH (GAUZE/BANDAGES/DRESSINGS) ×2
BNDG CMPR MD 5X2 ELC HKLP STRL (GAUZE/BANDAGES/DRESSINGS)
BNDG COHESIVE 1X5 TAN STRL LF (GAUZE/BANDAGES/DRESSINGS) IMPLANT
BNDG COHESIVE 3X5 TAN STRL LF (GAUZE/BANDAGES/DRESSINGS) ×2 IMPLANT
BNDG COHESIVE 4X5 TAN STRL (GAUZE/BANDAGES/DRESSINGS) IMPLANT
BNDG ELASTIC 2 VLCR STRL LF (GAUZE/BANDAGES/DRESSINGS) IMPLANT
BNDG ESMARK 4X9 LF (GAUZE/BANDAGES/DRESSINGS) ×4 IMPLANT
BNDG GAUZE ELAST 4 BULKY (GAUZE/BANDAGES/DRESSINGS) ×8 IMPLANT
BRUSH SCRUB EZ PLAIN DRY (MISCELLANEOUS) ×4 IMPLANT
CANISTER SUCT 1200ML W/VALVE (MISCELLANEOUS) ×2 IMPLANT
CLOSURE WOUND 1/2 X4 (GAUZE/BANDAGES/DRESSINGS) ×1
CORDS BIPOLAR (ELECTRODE) ×4 IMPLANT
COVER MAYO STAND STRL (DRAPES) ×4 IMPLANT
COVER TABLE BACK 60X90 (DRAPES) ×4 IMPLANT
CUFF TOURNIQUET SINGLE 18IN (TOURNIQUET CUFF) ×2 IMPLANT
DECANTER SPIKE VIAL GLASS SM (MISCELLANEOUS) IMPLANT
DRAIN PENROSE 1/4X12 LTX STRL (WOUND CARE) IMPLANT
DRAPE EXTREMITY T 121X128X90 (DRAPE) ×4 IMPLANT
DRAPE OEC MINIVIEW 54X84 (DRAPES) ×2 IMPLANT
DRAPE SURG 17X23 STRL (DRAPES) ×4 IMPLANT
DRSG TEGADERM 4X4.75 (GAUZE/BANDAGES/DRESSINGS) ×2 IMPLANT
GAUZE SPONGE 4X4 16PLY XRAY LF (GAUZE/BANDAGES/DRESSINGS) IMPLANT
GAUZE XEROFORM 1X8 LF (GAUZE/BANDAGES/DRESSINGS) ×2 IMPLANT
GLOVE BIOGEL M STRL SZ7.5 (GLOVE) ×4 IMPLANT
GLOVE BIOGEL PI IND STRL 7.0 (GLOVE) IMPLANT
GLOVE BIOGEL PI INDICATOR 7.0 (GLOVE) ×2
GLOVE ECLIPSE 7.0 STRL STRAW (GLOVE) ×2 IMPLANT
GLOVE EXAM NITRILE MD LF STRL (GLOVE) ×2 IMPLANT
GLOVE ORTHO TXT STRL SZ7.5 (GLOVE) ×4 IMPLANT
GOWN STRL REUS W/ TWL LRG LVL3 (GOWN DISPOSABLE) ×2 IMPLANT
GOWN STRL REUS W/ TWL XL LVL3 (GOWN DISPOSABLE) IMPLANT
GOWN STRL REUS W/TWL LRG LVL3 (GOWN DISPOSABLE) ×4
GOWN STRL REUS W/TWL XL LVL3 (GOWN DISPOSABLE) ×12 IMPLANT
LOOP VESSEL MAXI BLUE (MISCELLANEOUS) IMPLANT
NDL ADDISON D1/2 CIR (NEEDLE) IMPLANT
NDL HYPO 25X1 1.5 SAFETY (NEEDLE) IMPLANT
NDL KEITH (NEEDLE) IMPLANT
NDL KEITH SZ10 STRAIGHT (NEEDLE) IMPLANT
NEEDLE 27GAX1X1/2 (NEEDLE) ×2 IMPLANT
NEEDLE ADDISON D1/2 CIR (NEEDLE) IMPLANT
NEEDLE HYPO 25X1 1.5 SAFETY (NEEDLE) IMPLANT
NEEDLE KEITH (NEEDLE) IMPLANT
NEEDLE KEITH SZ10 STRAIGHT (NEEDLE) IMPLANT
NS IRRIG 1000ML POUR BTL (IV SOLUTION) ×4 IMPLANT
PACK BASIN DAY SURGERY FS (CUSTOM PROCEDURE TRAY) ×4 IMPLANT
PAD CAST 3X4 CTTN HI CHSV (CAST SUPPLIES) ×4 IMPLANT
PADDING CAST ABS 3INX4YD NS (CAST SUPPLIES)
PADDING CAST ABS 4INX4YD NS (CAST SUPPLIES)
PADDING CAST ABS COTTON 3X4 (CAST SUPPLIES) IMPLANT
PADDING CAST ABS COTTON 4X4 ST (CAST SUPPLIES) ×2 IMPLANT
PADDING CAST COTTON 3X4 STRL (CAST SUPPLIES) ×8
PASSER SUT SWANSON 36MM LOOP (INSTRUMENTS) IMPLANT
SLEEVE SCD COMPRESS KNEE MED (MISCELLANEOUS) ×2 IMPLANT
SPLINT PLASTER CAST XFAST 3X15 (CAST SUPPLIES) IMPLANT
SPLINT PLASTER XTRA FASTSET 3X (CAST SUPPLIES) ×42
SPONGE GAUZE 4X4 12PLY (GAUZE/BANDAGES/DRESSINGS) ×4 IMPLANT
SPONGE GAUZE 4X4 12PLY STER LF (GAUZE/BANDAGES/DRESSINGS) ×2 IMPLANT
STOCKINETTE 4X48 STRL (DRAPES) ×4 IMPLANT
STOCKINETTE 6  STRL (DRAPES)
STOCKINETTE 6 STRL (DRAPES) IMPLANT
STRIP CLOSURE SKIN 1/2X4 (GAUZE/BANDAGES/DRESSINGS) ×3 IMPLANT
SUCTION FRAZIER TIP 10 FR DISP (SUCTIONS) IMPLANT
SUT ETHIBOND 3-0 V-5 (SUTURE) ×2 IMPLANT
SUT ETHILON 5 0 P 3 18 (SUTURE)
SUT FIBERWIRE 3-0 18 TAPR NDL (SUTURE)
SUT FIBERWIRE 4-0 18 TAPR NDL (SUTURE)
SUT MERSILENE 4 0 P 3 (SUTURE) IMPLANT
SUT MERSILENE 6 0 P 1 (SUTURE) IMPLANT
SUT MERSILENE 6 0 S14 DA (SUTURE) IMPLANT
SUT NYLON ETHILON 5-0 P-3 1X18 (SUTURE) IMPLANT
SUT POLY BUTTON 15MM (SUTURE) IMPLANT
SUT PROLENE 2 0 SH DA (SUTURE) IMPLANT
SUT PROLENE 3 0 PS 2 (SUTURE) ×2 IMPLANT
SUT PROLENE 4 0 P 3 18 (SUTURE) ×2 IMPLANT
SUT PROLENE 4 0 PS 2 18 (SUTURE) IMPLANT
SUT SILK 4 0 PS 2 (SUTURE) IMPLANT
SUT VIC AB 0 SH 27 (SUTURE) ×4 IMPLANT
SUT VIC AB 2-0 PS2 27 (SUTURE) ×4 IMPLANT
SUT VIC AB 3-0 FS2 27 (SUTURE) IMPLANT
SUT VIC AB 3-0 SH 27 (SUTURE)
SUT VIC AB 3-0 SH 27XBRD (SUTURE) IMPLANT
SUT VIC AB 4-0 P-3 18XBRD (SUTURE) IMPLANT
SUT VIC AB 4-0 P3 18 (SUTURE) ×4
SUT VIC AB 4-0 SH 27 (SUTURE)
SUT VIC AB 4-0 SH 27XANBCTRL (SUTURE) IMPLANT
SUT VICRYL 4-0 PS2 18IN ABS (SUTURE) IMPLANT
SUT VICRYL AB 4 0 18 (SUTURE) IMPLANT
SUTURE FIBERWR 3-0 18 TAPR NDL (SUTURE) IMPLANT
SUTURE FIBERWR 4-0 18 TAPR NDL (SUTURE) IMPLANT
SYR 3ML 23GX1 SAFETY (SYRINGE) IMPLANT
SYR 5ML LL (SYRINGE) IMPLANT
SYR BULB 3OZ (MISCELLANEOUS) ×4 IMPLANT
SYR CONTROL 10ML LL (SYRINGE) ×4 IMPLANT
TOWEL OR 17X24 6PK STRL BLUE (TOWEL DISPOSABLE) ×6 IMPLANT
TRAY DSU PREP LF (CUSTOM PROCEDURE TRAY) ×4 IMPLANT
TUBE CONNECTING 20'X1/4 (TUBING)
TUBE CONNECTING 20X1/4 (TUBING) ×2 IMPLANT
TUBE FEEDING 5FR 15 INCH (TUBING) IMPLANT
UNDERPAD 30X30 INCONTINENT (UNDERPADS AND DIAPERS) ×4 IMPLANT

## 2013-05-14 NOTE — Transfer of Care (Signed)
Immediate Anesthesia Transfer of Care Note  Patient: [name]  Procedure(s) Performed: Procedure(s): ARTHOPLASTY DISTAL RADIAL ULNAR JOINT, RIGHT  (Right) LONG FINGER EXTENSOR TO ELEVATE RING AND SMALL FINGERS  (Right)  Patient Location: PACU  Anesthesia Type:General  Level of Consciousness: sedated  Airway & Oxygen Therapy: Patient Spontanous Breathing and Patient connected to face mask oxygen  Post-op Assessment: Report given to PACU RN and Post -op Vital signs reviewed and stable  Post vital signs: Reviewed and stable  Complications: No apparent anesthesia complications

## 2013-05-14 NOTE — Brief Op Note (Signed)
05/14/2013  11:26 AM  PATIENT:  Edgardo Roys  73 y.o. 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  Reconstruction of extensor retinaculum with free tendon graft of edl to ring to remnants of 5th and 3rd extensor compartments.  Debridement of long acting steroid deposit.   SURGEON:  Surgeon(s) and Role:    * Cammie Sickle., MD - Primary  PHYSICIAN ASSISTANT:   ASSISTANTS: Kathyrn Sheriff.A-C    ANESTHESIA:   general  EBL:  Total I/O In: 1000 [I.V.:1000] Out: -   BLOOD ADMINISTERED:none  DRAINS: none   LOCAL MEDICATIONS USED:  XYLOCAINE   SPECIMEN:  Biopsy / Limited Resection  DISPOSITION OF SPECIMEN:  PATHOLOGY  COUNTS:  YES  TOURNIQUET:  * Missing tourniquet times found for documented tourniquets in log:  [id number] *  DICTATION: complete  PLAN OF CARE: discharge to care of family  PATIENT DISPOSITION:  Discharged from PACU   Delay start of Pharmacological VTE agent (>33POI):  Not applicable

## 2013-05-14 NOTE — Anesthesia Postprocedure Evaluation (Signed)
  Anesthesia Post-op Note  Patient: [name]  Procedure(s) Performed: Procedure(s): ARTHOPLASTY DISTAL RADIAL ULNAR JOINT, RIGHT  (Right) LONG FINGER EXTENSOR TO ELEVATE RING AND SMALL FINGERS  (Right)  Patient Location: PACU  Anesthesia Type:General  Level of Consciousness: awake, alert , oriented and patient cooperative  Airway and Oxygen Therapy: Patient Spontanous Breathing  Post-op Pain: mild  Post-op Assessment: Post-op Vital signs reviewed, Patient's Cardiovascular Status Stable, Respiratory Function Stable, Patent Airway, No signs of Nausea or vomiting and Pain level controlled  Post-op Vital Signs: Reviewed and stable  Complications: No apparent anesthesia complications

## 2013-05-14 NOTE — Discharge Instructions (Signed)

## 2013-05-14 NOTE — Anesthesia Procedure Notes (Signed)
Procedure Name: Intubation Date/Time: 05/14/2013 10:06 AM Performed by: Lieutenant Diego Pre-anesthesia Checklist: Patient identified, Emergency Drugs available, Suction available and Patient being monitored Patient Re-evaluated:Patient Re-evaluated prior to inductionOxygen Delivery Method: Circle System Utilized Preoxygenation: Pre-oxygenation with 100% oxygen Intubation Type: IV induction Ventilation: Mask ventilation without difficulty Laryngoscope Size: Miller and 2 Grade View: Grade I Tube type: Oral Tube size: 7.0 mm Number of attempts: 1 Airway Equipment and Method: stylet and oral airway Placement Confirmation: ETT inserted through vocal cords under direct vision,  positive ETCO2 and breath sounds checked- equal and bilateral Secured at: 24 cm Tube secured with: Tape Dental Injury: Teeth and Oropharynx as per pre-operative assessment

## 2013-05-14 NOTE — Progress Notes (Signed)
While giving discharge instructions to pt, husband and friend - husband was falling asleep. Woke up and said "get on with it! " in a harsh voice. Pt was saying pain is 6/10 after Dilaudid 2.30m IV given and requesting pain pill. While giving instructions pt was falling asleep.  Encouraged pt to wait till she gets home, eat lunch and then take pain pill if needed. Explained she had a strong narcotic here for pain. Friend encouraged her to eat and then get pain med later as well. Friend assisted pt with dressing. When ready to walk to car pt asked me " is someone going to check on me?" I asked her " are you talking about a nurse?" Pt said, "Yes!" Explained that Dr. SDaylene Katayamadid not set up a home health  Nurse to check on her that she would need to talk with Dr. SDaylene Katayamaabout it if she felt she needed someone.

## 2013-05-14 NOTE — Anesthesia Preprocedure Evaluation (Addendum)
Anesthesia Evaluation  Patient identified by MRN, date of birth, ID band Patient awake    Reviewed: Allergy & Precautions, H&P , NPO status , Patient's Chart, lab work & pertinent test results  History of Anesthesia Complications Negative for: history of anesthetic complications  Airway Mallampati: II TM Distance: >3 FB Neck ROM: Full    Dental  (+) Teeth Intact and Dental Advisory Given   Pulmonary neg pulmonary ROS,  breath sounds clear to auscultation  Pulmonary exam normal       Cardiovascular negative cardio ROS  Rhythm:Regular Rate:Normal     Neuro/Psych negative neurological ROS     GI/Hepatic Neg liver ROS, GERD-  Medicated and Poorly Controlled,  Endo/Other  negative endocrine ROS  Renal/GU negative Renal ROS     Musculoskeletal   Abdominal   Peds  Hematology negative hematology ROS (+)   Anesthesia Other Findings   Reproductive/Obstetrics                        Anesthesia Physical Anesthesia Plan  ASA: II  Anesthesia Plan: General   Post-op Pain Management:    Induction: Intravenous  Airway Management Planned: Oral ETT  Additional Equipment:   Intra-op Plan:   Post-operative Plan:   Informed Consent: I have reviewed the patients History and Physical, chart, labs and discussed the procedure including the risks, benefits and alternatives for the proposed anesthesia with the patient or authorized representative who has indicated his/her understanding and acceptance.   Dental advisory given  Plan Discussed with: CRNA and Surgeon  Anesthesia Plan Comments: (Plan routine monitors, GETA)       Anesthesia Quick Evaluation

## 2013-05-15 ENCOUNTER — Encounter (HOSPITAL_BASED_OUTPATIENT_CLINIC_OR_DEPARTMENT_OTHER): Payer: Self-pay | Admitting: Orthopedic Surgery

## 2013-05-15 NOTE — Op Note (Signed)
[name], [name]              ACCOUNT NO.:  [id number]  MEDICAL RECORD NO.:  [id number]  LOCATION:                               FACILITY:  [facility]  PHYSICIAN:  [name], M.D. 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  Also, absence of anatomic extensor indicis proprius and preoperative demonstration of independent extension of index finger.  OPERATIONS: 1. Synovectomy and exploration of third, fourth, and fifth dorsal     compartments of right wrist. 2. Right wrist distal radioulnar joint arthrotomy followed by marginal     osteophyte removal, synovectomy, and capsulorrhaphy utilizing a     distally based flap of the extensor retinaculum of the fourth     dorsal compartment. 3. Reconstruction/salvage of extension of right ring and small fingers     by distal transfer to extensor digitorum longus, long finger. 4. Reconstruction of extensor retinaculum with free tendon graft     harvested from ring finger extensor to fifth and third dorsal     compartments  recreating the fourth dorsal compartment margins.  OPERATING SURGEON:  Veronica Mighty. Martia Dalby, MD  ASSISTANT:  [name]. Dasnoit, PA-C.  ANESTHESIA:  General by LMA.  SUPERVISING ANESTHESIOLOGIST:  [name].  INDICATIONS:  [name] is a 73 year old woman referred through the courtesy of [name] for management of extensor lag of her ring and small fingers.  She has a history of background osteoarthritis. In the fall of 2014, she developed swelling over the dorsal aspect of her wrist.  She was evaluated by her primary care physician and sports medicine physician.  She had ultrasound evaluation of her wrist and had been advised to have treatment for tenosynovitis.  It is apparent from her explanation that she had a probable steroid injection and was placed in a compression sleeve.  She noted in early December 2014, while placing her fingers into the compression sleeve, the inability to fully extend her small and ring fingers.  She subsequently was seen in early January 2015, for Hand Surgery Consultation.  At that time, we identified the Vaughn-Jackson syndrome.  She had arthritis at the distal radioulnar joint and an osteophyte at the distal ulna, synovitis in her fourth dorsal compartment, synovitis in her fifth dorsal compartment, dorsal prominence of the ulna, and extensor lag of the ring and small fingers.  She did  demonstrate independent extension of the index finger suggesting that she had an extensor indicis proprius.  We advised [name] to proceed with reconstruction of her extension by tendon transfer.  We very carefully pointed out to her preoperatively that this is a salvage procedure and we could not recreate normal anatomy by asking one tendon to do the work of many.  Our initial plan was to use her extensor indicis proprius for reanimation of the small finger and side-to-side transfer of the extensor digitorum longus to the ring finger, to  the active extensor of the long finger.  We also described to her how we will perform synovectomy and possible ulnar head resection versus arthroplasty and reinforcement of the capsule with an extensor retinaculum transfer.  [name] had excellent pronation and supination of the forearm, therefore, preservation of her joint did have some merit.  After detailed informed consent, she is brought to the operating room at this time.  PROCEDURE:  [name] was brought to room 2 of the [facility] and placed in supine position on the operating table.  She had a detailed anesthesia informed consent by [name] and was advised to undergo general anesthesia by LMA technique.  In room 2 under [name] direct supervision, general anesthesia by LMA technique was induced followed by routine Betadine scrub and paint of the right upper extremity.  2 g of Ancef were administered as an IV prophylactic antibiotic.  The procedure commenced with exsanguination of the right hand and arm with an Esmarch bandage, inflation of the arterial tourniquet on the proximal right brachium to 220 mmHg.  A routine surgical time-out was accomplished.  We proceeded to expose the extensors in the fourth and fifth dorsal compartments, through a longitudinal incision paralleling the ulnar aspect of the fourth dorsal compartment.  Abundant synovitis was identified.  Care was taken to protect the dorsal, radial, and ulnar sensory branches.  A transverse vein was suture ligated.  The compartments were identified and the fourth dorsal compartment released along its radial border.  A significant flap of retinaculum was created.  The extensor digiti minimi was ruptured and retracted.  The extensor digitorum longus to the ring finger was ruptured and retracted.  Photographic documentation of pathology was accomplished.  We then explored the distal radioulnar joint capsule.  We found a defect in the  dorsal capsule measuring 4 mm in length and 2 mm in width.  There was abundant synovitis at distal radioulnar joint.  We performed a synovectomy of the distal radioulnar joint and removed the marginal osteophytes on the ulnar head.  Given her excellent motion, I decided to reconstruct the capsule of the distal radioulnar joint and reinforce the capsule with a retinacular flap.  A full-thickness flap of the extensor retinaculum to the fourth dorsal compartment was turned on a distal base and used to reinforce the capsule.  This was sewed with a double layer of circumferential figure- of-eight and running sutures of 3-0 Ethibond.  Motion of distal radioulnar joint was preserved.  We then carefully inventoried the remaining tendons.  There was a single extensor digitorum longus to the index finger and long finger, both in good shape.  There was no extensor indicis proprius noted.  The extensor pollicis longus appeared normal and was left undisturbed in third dorsal compartment.  After complete synovectomy with biopsy specimen being sent, we then reconstructed/salvaged extension to the ring and small fingers by end-to- side transfer of the common extensor to the long  finger and the common extensor and extensor digiti minimi of the small finger to the long finger extensor.  This is not an optimum transfer; however, at this point in life, I would not recommend a flexor transfer or extensor carpi ulnaris transfer.  The transfers were accomplished by Pulvertaft weaves with 3 passes each with corner sutures of 3-0 Ethibond.  Satisfactory cascade was achieved.  Ms. Olexa will likely have some extensor lag due to the fact they were asking one muscle to perform the work of four muscles.  We then reconstructed the extensor retinaculum with a portion of the ring finger paired extensor digitorum longus tendon.  This was passed through the remnants of the fifth dorsal compartment and the  third dorsal compartments with great care taken to avoid snagging the extensor pollicis longus.  After completion of this construct, we noted a satisfactory tenodesis effect.  We then proceeded to irrigate the wound and obtained hemostasis with bipolar cautery.  The wound was repaired with subcutaneous 4-0 Vicryl and intradermal 4-0 Prolene with Steri-Strips.  Ms. Bebee was placed in a sugar-tong splint with the forearm in 45 degrees supination and her wrist at 30 degrees of dorsiflexion and her MP joints of the long ring and small fingers at full extension.  She will be statically splinted for approximately 4 weeks and then begin dynamic range of motion exercise.  She was awakened from general anesthesia and transferred to the recovery room in stable signs.  The wounds were infiltrated with 2% lidocaine for postoperative comfort including the distal radioulnar joint.     Veronica Mighty Marguriete Wootan, M.D.     RVS/MEDQ  D:  05/14/2013  T:  05/15/2013  Job:  [id number]  cc:   Cresenciano Lick. Renold Genta, M.D. Andreas Blower, Dr.

## 2013-08-22 ENCOUNTER — Other Ambulatory Visit: Payer: Self-pay | Admitting: Family Medicine

## 2013-08-26 ENCOUNTER — Other Ambulatory Visit: Payer: Self-pay

## 2013-08-26 MED ORDER — OMEPRAZOLE 20 MG PO CPDR: 20.0000 mg | DELAYED_RELEASE_CAPSULE | Freq: Two times a day (BID) | ORAL | Status: DC

## 2013-08-28 ENCOUNTER — Other Ambulatory Visit: Payer: Self-pay | Admitting: Orthopedic Surgery

## 2013-08-31 ENCOUNTER — Encounter (HOSPITAL_BASED_OUTPATIENT_CLINIC_OR_DEPARTMENT_OTHER): Payer: Self-pay | Admitting: *Deleted

## 2013-09-02 NOTE — H&P (Signed)
[name] is an 73 y.o. female.   Chief Complaint: c/o recurrent drooping of the long, ring and small fingers of the right hand HPI: [name] called our office on 08/26/13 after an acute episode at home when she was placing a glove on her right hand. She has been performing home exercises with putty to increase the strength of her right hand grip.  She noted that she had some swelling over her extensor tendon transfer site dorsally.   We have treated her for the [name] syndrome with synovectomy of the distal radial ulnar joint and synovial biopsy to rule out rheumatoid arthritis.  Her synovium was benign and did not show signs of rheumatoid disease.  We have treated her like a mechanical [name] syndrome with removal of her osteophytes, reconstruction of the distal radial ulnar joint, reenforcement of the dorsal capsule and tendon transfer.   As she had a unprecedented absence of the extensor indices proprius, I made the decision to use her long finger extensor to animate the long, ring and small fingers.  To this point in time this has been successful with recovery of full extension.  Unfortunately, it appears that the tendon reconstruction has ruptured.  This may be due to the choice of using a single extensor to animate three fingers.  An alternative would have been a wrist flexor transfer, or flexor superficialis transfer.  We have been working to refer Veronica. James for a rheumatology consult to be sure she does not have an inflammatory arthritis that would improve on medication. We are worried that she is at risk for other joint impairment and/or future tendon ruptures.      Past Medical History  Diagnosis Date  . Hyperlipidemia   . Arthritis   . Wears glasses   . GERD (gastroesophageal reflux disease)   . Chronic constipation     Past Surgical History  Procedure Laterality Date  . Tubal ligation    . Tonsillectomy    . Esophageal dilation  05/31/1997  .  Seborrheic keratosis  09/03/12    inflamed, excised from forehead  . Dilation and curettage of uterus  2009  . Colonoscopy    . Ulnar head excision Right 05/14/2013    Procedure: ARTHOPLASTY DISTAL RADIAL ULNAR JOINT, RIGHT ;  Surgeon: Cammie Sickle., MD;  Location: [facility];  Service: Orthopedics;  Laterality: Right;  . Repair extensor tendon Right 05/14/2013    Procedure: LONG FINGER EXTENSOR TO ELEVATE RING AND SMALL FINGERS ;  Surgeon: Cammie Sickle., MD;  Location: [facility];  Service: Orthopedics;  Laterality: Right;    No family history on file. Social History:  reports that she has never smoked. She has never used smokeless tobacco. She reports that she drinks about 10.5 ounces of alcohol per week. She reports that she does not use illicit drugs.  Allergies:  Allergies  Allergen Reactions  . Celebrex [Celecoxib] Nausea And Vomiting    No prescriptions prior to admission    No results found for this or any previous visit (from the past 48 hour(s)).  No results found.   Pertinent items are noted in HPI.  Height 5' 4"  (1.626 m), weight 55.339 kg (122 lb).  General appearance: alert Head: Normocephalic, without obvious abnormality Neck: supple, symmetrical, trachea midline Resp: clear to auscultation bilaterally Cardio: regular rate and rhythm GI: normal findings: bowel sounds normal Extremities: [name] presents at this time and clearly has swelling over the  dorsum of her wrist near the distal radioulnar joint.  It appears that she has ruptured just proximal to her Pulvertaft weave.  It is possible that the sutures of the Pulvertaft weave have affected the intratendinous blood supply or it is possible that she simply did not have enough tensile strength in her extensor digitorum longus of the long finger to animate three fingers.     Pulses: 2+ and symmetric Skin: normal Neurologic: Grossly normal    Assessment/Plan Impression:  Failure of previous tendon transfer EDC long to ring and small at dorsal right wrist for [name] syndrome.  Plan: To the OR for FDS transfer to the long, ring and small finger extensors right hand.The procedure, risks,benefits and post-op course were discussed with the patient at length in the office and during a detailed follow up phone conversation on 09/02/2013. [name] is in agreement with the plan fully understanding that some decisions as to the optimum course of action will be decided during surgery as we achieve a complete understanding of the exact tendon failure issues by direct inspection.  Questions were invited and answered in detail.  [name] 09/02/2013, 3:20 PM  H&P documentation: 09/03/2013  -History and Physical Reviewed  -Patient has been re-examined  -No change in the plan of care  Cammie Sickle, MD

## 2013-09-03 ENCOUNTER — Ambulatory Visit (HOSPITAL_BASED_OUTPATIENT_CLINIC_OR_DEPARTMENT_OTHER): Payer: Medicare Other | Admitting: Anesthesiology

## 2013-09-03 ENCOUNTER — Ambulatory Visit (HOSPITAL_BASED_OUTPATIENT_CLINIC_OR_DEPARTMENT_OTHER)
Admission: RE | Admit: 2013-09-03 | Discharge: 2013-09-03 | Disposition: A | Discharge status: Home / Self Care | Payer: Medicare Other | Source: Ambulatory Visit | Attending: Orthopedic Surgery | Admitting: Orthopedic Surgery

## 2013-09-03 ENCOUNTER — Encounter (HOSPITAL_BASED_OUTPATIENT_CLINIC_OR_DEPARTMENT_OTHER): Payer: Self-pay | Admitting: Anesthesiology

## 2013-09-03 ENCOUNTER — Encounter (HOSPITAL_BASED_OUTPATIENT_CLINIC_OR_DEPARTMENT_OTHER)
Admission: RE | Disposition: A | Discharge status: Home / Self Care | Payer: Self-pay | Source: Ambulatory Visit | Attending: Orthopedic Surgery

## 2013-09-03 ENCOUNTER — Encounter (HOSPITAL_BASED_OUTPATIENT_CLINIC_OR_DEPARTMENT_OTHER): Payer: Medicare Other | Admitting: Anesthesiology

## 2013-09-03 DIAGNOSIS — M659 Unspecified synovitis and tenosynovitis, unspecified site: Secondary | ICD-10-CM | POA: Insufficient documentation

## 2013-09-03 DIAGNOSIS — M66249 Spontaneous rupture of extensor tendons, unspecified hand: Principal | ICD-10-CM | POA: Insufficient documentation

## 2013-09-03 DIAGNOSIS — M66239 Spontaneous rupture of extensor tendons, unspecified forearm: Secondary | ICD-10-CM | POA: Insufficient documentation

## 2013-09-03 DIAGNOSIS — K219 Gastro-esophageal reflux disease without esophagitis: Secondary | ICD-10-CM | POA: Insufficient documentation

## 2013-09-03 HISTORY — PX: TENDON TRANSFER: SHX6109

## 2013-09-03 LAB — POCT HEMOGLOBIN-HEMACUE: Hemoglobin: 14.4 g/dL (ref 12.0–15.0)

## 2013-09-03 SURGERY — TRANSFER, TENDON
Anesthesia: General | Site: Hand | Laterality: Right

## 2013-09-03 MED ORDER — OXYCODONE HCL 5 MG/5ML PO SOLN
5.0000 mg | Freq: Once | ORAL | Status: AC | PRN
Start: 2013-09-03 — End: 2013-09-03

## 2013-09-03 MED ORDER — HYDROMORPHONE HCL PF 1 MG/ML IJ SOLN
0.2500 mg | INTRAMUSCULAR | Status: DC | PRN
  Administered 2013-09-03 (×2): 0.5 mg via INTRAVENOUS

## 2013-09-03 MED ORDER — CEFAZOLIN SODIUM-DEXTROSE 2-3 GM-% IV SOLR
INTRAVENOUS | Status: AC
  Filled 2013-09-03: qty 50

## 2013-09-03 MED ORDER — DEXAMETHASONE SODIUM PHOSPHATE 10 MG/ML IJ SOLN
INTRAMUSCULAR | Status: DC | PRN
Start: 2013-09-03 — End: 2013-09-03
  Administered 2013-09-03: 4 mg via INTRAVENOUS

## 2013-09-03 MED ORDER — OXYCODONE-ACETAMINOPHEN 5-325 MG PO TABS: ORAL_TABLET | ORAL | Status: DC

## 2013-09-03 MED ORDER — OXYCODONE HCL 5 MG PO TABS
5.0000 mg | ORAL_TABLET | Freq: Once | ORAL | Status: AC | PRN
  Administered 2013-09-03: 5 mg via ORAL

## 2013-09-03 MED ORDER — LIDOCAINE HCL 2 % IJ SOLN
INTRAMUSCULAR | Status: AC
  Filled 2013-09-03: qty 60

## 2013-09-03 MED ORDER — CHLORHEXIDINE GLUCONATE 4 % EX LIQD: 60.0000 mL | Freq: Once | CUTANEOUS | Status: DC

## 2013-09-03 MED ORDER — FENTANYL CITRATE 0.05 MG/ML IJ SOLN
INTRAMUSCULAR | Status: AC
  Filled 2013-09-03: qty 4

## 2013-09-03 MED ORDER — MIDAZOLAM HCL 2 MG/2ML IJ SOLN
INTRAMUSCULAR | Status: AC
  Filled 2013-09-03: qty 2

## 2013-09-03 MED ORDER — CEFAZOLIN SODIUM-DEXTROSE 2-3 GM-% IV SOLR
2.0000 g | INTRAVENOUS | Status: AC
  Administered 2013-09-03: 2 g via INTRAVENOUS

## 2013-09-03 MED ORDER — ONDANSETRON HCL 4 MG/2ML IJ SOLN
INTRAMUSCULAR | Status: DC | PRN
  Administered 2013-09-03: 4 mg via INTRAVENOUS

## 2013-09-03 MED ORDER — HYDROMORPHONE HCL PF 1 MG/ML IJ SOLN
0.2500 mg | INTRAMUSCULAR | Status: DC | PRN
  Administered 2013-09-03 (×4): 0.5 mg via INTRAVENOUS

## 2013-09-03 MED ORDER — CEPHALEXIN 500 MG PO CAPS: 500.0000 mg | ORAL_CAPSULE | Freq: Three times a day (TID) | ORAL | Status: DC

## 2013-09-03 MED ORDER — PROPOFOL 10 MG/ML IV BOLUS
INTRAVENOUS | Status: DC | PRN
Start: 2013-09-03 — End: 2013-09-03
  Administered 2013-09-03: 120 mg via INTRAVENOUS

## 2013-09-03 MED ORDER — PROPOFOL 10 MG/ML IV BOLUS
INTRAVENOUS | Status: AC
  Filled 2013-09-03: qty 80

## 2013-09-03 MED ORDER — METOCLOPRAMIDE HCL 5 MG/ML IJ SOLN: 10.0000 mg | Freq: Once | INTRAMUSCULAR | Status: DC | PRN

## 2013-09-03 MED ORDER — FENTANYL CITRATE 0.05 MG/ML IJ SOLN
INTRAMUSCULAR | Status: DC | PRN
  Administered 2013-09-03 (×2): 25 ug via INTRAVENOUS
  Administered 2013-09-03: 50 ug via INTRAVENOUS
  Administered 2013-09-03 (×4): 25 ug via INTRAVENOUS

## 2013-09-03 MED ORDER — OXYCODONE HCL 5 MG PO TABS
ORAL_TABLET | ORAL | Status: AC
  Filled 2013-09-03: qty 1

## 2013-09-03 MED ORDER — MIDAZOLAM HCL 2 MG/2ML IJ SOLN: 1.0000 mg | INTRAMUSCULAR | Status: DC | PRN

## 2013-09-03 MED ORDER — HYDROMORPHONE HCL PF 1 MG/ML IJ SOLN
INTRAMUSCULAR | Status: AC
  Filled 2013-09-03: qty 1

## 2013-09-03 MED ORDER — FENTANYL CITRATE 0.05 MG/ML IJ SOLN: 50.0000 ug | INTRAMUSCULAR | Status: DC | PRN

## 2013-09-03 MED ORDER — LACTATED RINGERS IV SOLN
INTRAVENOUS | Status: DC
  Administered 2013-09-03 (×2): via INTRAVENOUS

## 2013-09-03 MED ORDER — LIDOCAINE HCL 2 % IJ SOLN
INTRAMUSCULAR | Status: DC | PRN
  Administered 2013-09-03: 5 mL

## 2013-09-03 MED ORDER — MIDAZOLAM HCL 5 MG/5ML IJ SOLN
INTRAMUSCULAR | Status: DC | PRN
  Administered 2013-09-03: 1 mg via INTRAVENOUS

## 2013-09-03 MED ORDER — LIDOCAINE HCL (CARDIAC) 20 MG/ML IV SOLN
INTRAVENOUS | Status: DC | PRN
  Administered 2013-09-03: 60 mg via INTRAVENOUS

## 2013-09-03 SURGICAL SUPPLY — 90 items
BANDAGE ADH SHEER 1  50/CT (GAUZE/BANDAGES/DRESSINGS) IMPLANT
BANDAGE COBAN STERILE 2 (GAUZE/BANDAGES/DRESSINGS) IMPLANT
BANDAGE ELASTIC 3 VELCRO ST LF (GAUZE/BANDAGES/DRESSINGS) ×2 IMPLANT
BLADE 15 SAFETY STRL DISP (BLADE) ×1 IMPLANT
BLADE MINI RND TIP GREEN BEAV (BLADE) ×2 IMPLANT
BLADE SURG 15 STRL LF DISP TIS (BLADE) IMPLANT
BLADE SURG 15 STRL SS (BLADE) ×3
BNDG CMPR 9X4 STRL LF SNTH (GAUZE/BANDAGES/DRESSINGS) ×1
BNDG CMPR MD 5X2 ELC HKLP STRL (GAUZE/BANDAGES/DRESSINGS) ×2
BNDG COHESIVE 1X5 TAN STRL LF (GAUZE/BANDAGES/DRESSINGS) IMPLANT
BNDG ELASTIC 2 VLCR STRL LF (GAUZE/BANDAGES/DRESSINGS) ×4 IMPLANT
BNDG ESMARK 4X9 LF (GAUZE/BANDAGES/DRESSINGS) ×2 IMPLANT
BNDG GAUZE ELAST 4 BULKY (GAUZE/BANDAGES/DRESSINGS) ×4 IMPLANT
BRUSH SCRUB EZ PLAIN DRY (MISCELLANEOUS) ×3 IMPLANT
CLOSURE WOUND 1/2 X4 (GAUZE/BANDAGES/DRESSINGS) ×1
CORDS BIPOLAR (ELECTRODE) ×3 IMPLANT
COVER MAYO STAND STRL (DRAPES) ×3 IMPLANT
COVER TABLE BACK 60X90 (DRAPES) ×3 IMPLANT
CUFF TOURNIQUET SINGLE 18IN (TOURNIQUET CUFF) ×2 IMPLANT
DECANTER SPIKE VIAL GLASS SM (MISCELLANEOUS) IMPLANT
DRAIN PENROSE 1/4X12 LTX STRL (WOUND CARE) IMPLANT
DRAPE EXTREMITY T 121X128X90 (DRAPE) ×3 IMPLANT
DRAPE OEC MINIVIEW 54X84 (DRAPES) ×2 IMPLANT
DRAPE SURG 17X23 STRL (DRAPES) ×3 IMPLANT
GAUZE SPONGE 4X4 12PLY STRL (GAUZE/BANDAGES/DRESSINGS) ×3 IMPLANT
GAUZE SPONGE 4X4 16PLY XRAY LF (GAUZE/BANDAGES/DRESSINGS) IMPLANT
GAUZE XEROFORM 1X8 LF (GAUZE/BANDAGES/DRESSINGS) ×1 IMPLANT
GLOVE BIO SURGEON STRL SZ 6.5 (GLOVE) ×1 IMPLANT
GLOVE BIO SURGEONS STRL SZ 6.5 (GLOVE) ×1
GLOVE BIOGEL M STRL SZ7.5 (GLOVE) ×1 IMPLANT
GLOVE BIOGEL PI IND STRL 7.0 (GLOVE) IMPLANT
GLOVE BIOGEL PI INDICATOR 7.0 (GLOVE) ×2
GLOVE ORTHO TXT STRL SZ7.5 (GLOVE) ×3 IMPLANT
GOWN STRL REUS W/ TWL LRG LVL3 (GOWN DISPOSABLE) ×1 IMPLANT
GOWN STRL REUS W/ TWL XL LVL3 (GOWN DISPOSABLE) ×2 IMPLANT
GOWN STRL REUS W/TWL LRG LVL3 (GOWN DISPOSABLE) ×3
GOWN STRL REUS W/TWL XL LVL3 (GOWN DISPOSABLE) ×3
LOOP VESSEL MAXI BLUE (MISCELLANEOUS) ×2 IMPLANT
NDL ADDISON D1/2 CIR (NEEDLE) IMPLANT
NDL HYPO 25X1 1.5 SAFETY (NEEDLE) IMPLANT
NDL KEITH (NEEDLE) IMPLANT
NDL KEITH SZ10 STRAIGHT (NEEDLE) IMPLANT
NEEDLE 27GAX1X1/2 (NEEDLE) IMPLANT
NEEDLE ADDISON D1/2 CIR (NEEDLE) IMPLANT
NEEDLE HYPO 25X1 1.5 SAFETY (NEEDLE) IMPLANT
NEEDLE KEITH (NEEDLE) IMPLANT
NEEDLE KEITH SZ10 STRAIGHT (NEEDLE) IMPLANT
NS IRRIG 1000ML POUR BTL (IV SOLUTION) ×3 IMPLANT
PACK BASIN DAY SURGERY FS (CUSTOM PROCEDURE TRAY) ×3 IMPLANT
PAD CAST 3X4 CTTN HI CHSV (CAST SUPPLIES) IMPLANT
PADDING CAST ABS 3INX4YD NS (CAST SUPPLIES) ×2
PADDING CAST ABS 4INX4YD NS (CAST SUPPLIES) ×2
PADDING CAST ABS COTTON 3X4 (CAST SUPPLIES) IMPLANT
PADDING CAST ABS COTTON 4X4 ST (CAST SUPPLIES) ×1 IMPLANT
PADDING CAST COTTON 3X4 STRL (CAST SUPPLIES) ×3
PASSER SUT SWANSON 36MM LOOP (INSTRUMENTS) IMPLANT
SLEEVE SCD COMPRESS KNEE MED (MISCELLANEOUS) ×2 IMPLANT
SLING ARM MED ADULT FOAM STRAP (SOFTGOODS) ×2 IMPLANT
SPLINT PLASTER CAST XFAST 3X15 (CAST SUPPLIES) IMPLANT
SPLINT PLASTER XTRA FASTSET 3X (CAST SUPPLIES) ×40
STOCKINETTE 4X48 STRL (DRAPES) ×3 IMPLANT
STOCKINETTE 6  STRL (DRAPES)
STOCKINETTE 6 STRL (DRAPES) IMPLANT
STRIP CLOSURE SKIN 1/2X4 (GAUZE/BANDAGES/DRESSINGS) ×1 IMPLANT
SUT ETHIBOND 3-0 V-5 (SUTURE) ×4 IMPLANT
SUT ETHILON 5 0 P 3 18 (SUTURE)
SUT FIBERWIRE 3-0 18 TAPR NDL (SUTURE)
SUT FIBERWIRE 4-0 18 TAPR NDL (SUTURE)
SUT MERSILENE 4 0 P 3 (SUTURE) IMPLANT
SUT MERSILENE 6 0 P 1 (SUTURE) IMPLANT
SUT MERSILENE 6 0 S14 DA (SUTURE) IMPLANT
SUT NYLON ETHILON 5-0 P-3 1X18 (SUTURE) IMPLANT
SUT POLY BUTTON 15MM (SUTURE) IMPLANT
SUT PROLENE 2 0 SH DA (SUTURE) IMPLANT
SUT PROLENE 3 0 PS 2 (SUTURE) ×2 IMPLANT
SUT PROLENE 4 0 PS 2 18 (SUTURE) IMPLANT
SUT SILK 4 0 PS 2 (SUTURE) IMPLANT
SUT VIC AB 4-0 SH 27 (SUTURE) ×3
SUT VIC AB 4-0 SH 27XANBCTRL (SUTURE) IMPLANT
SUTURE FIBERWR 3-0 18 TAPR NDL (SUTURE) IMPLANT
SUTURE FIBERWR 4-0 18 TAPR NDL (SUTURE) IMPLANT
SYR 3ML 23GX1 SAFETY (SYRINGE) IMPLANT
SYR 5ML LL (SYRINGE) IMPLANT
SYR BULB 3OZ (MISCELLANEOUS) ×3 IMPLANT
SYR CONTROL 10ML LL (SYRINGE) ×2 IMPLANT
TOWEL OR 17X24 6PK STRL BLUE (TOWEL DISPOSABLE) ×3 IMPLANT
TOWEL OR NON WOVEN STRL DISP B (DISPOSABLE) ×2 IMPLANT
TRAY DSU PREP LF (CUSTOM PROCEDURE TRAY) ×3 IMPLANT
TUBE FEEDING 5FR 15 INCH (TUBING) IMPLANT
UNDERPAD 30X30 INCONTINENT (UNDERPADS AND DIAPERS) ×3 IMPLANT

## 2013-09-03 NOTE — Anesthesia Postprocedure Evaluation (Signed)
Anesthesia Post Note  Patient: [name]  Procedure(s) Performed: Procedure(s) (LRB): RIGHT HAND FLEXOR CARPI RADIALUS TRANSFER TO LONG/RING/SMALL (Right)  Anesthesia type: General  Patient location: PACU  Post pain: Pain level controlled  Post assessment: Patient's Cardiovascular Status Stable  Last Vitals:  Filed Vitals:   09/03/13 1145  BP: 104/60  Pulse: 57  Temp:   Resp: 16    Post vital signs: Reviewed and stable  Level of consciousness: alert  Complications: No apparent anesthesia complications

## 2013-09-03 NOTE — Transfer of Care (Signed)
Immediate Anesthesia Transfer of Care Note  Patient: [name]  Procedure(s) Performed: Procedure(s): RIGHT HAND FLEXOR CARPI RADIALUS TRANSFER TO LONG/RING/SMALL (Right)  Patient Location: PACU  Anesthesia Type:General  Level of Consciousness: sedated  Airway & Oxygen Therapy: Patient Spontanous Breathing and Patient connected to face mask oxygen  Post-op Assessment: Report given to PACU RN and Post -op Vital signs reviewed and stable  Post vital signs: Reviewed and stable  Complications: No apparent anesthesia complications

## 2013-09-03 NOTE — Anesthesia Procedure Notes (Signed)
Procedure Name: LMA Insertion Date/Time: 09/03/2013 7:47 AM Performed by: Maryella Shivers Pre-anesthesia Checklist: Patient identified, Emergency Drugs available, Suction available and Patient being monitored Patient Re-evaluated:Patient Re-evaluated prior to inductionOxygen Delivery Method: Circle System Utilized Preoxygenation: Pre-oxygenation with 100% oxygen Intubation Type: IV induction Ventilation: Mask ventilation without difficulty LMA: LMA inserted LMA Size: 4.0 Number of attempts: 1 Airway Equipment and Method: bite block Placement Confirmation: positive ETCO2 Tube secured with: Tape Dental Injury: Teeth and Oropharynx as per pre-operative assessment

## 2013-09-03 NOTE — Op Note (Signed)
[id number] 

## 2013-09-03 NOTE — Discharge Instructions (Addendum)

## 2013-09-03 NOTE — Anesthesia Preprocedure Evaluation (Addendum)
Anesthesia Evaluation  Patient identified by MRN, date of birth, ID band Patient awake    Reviewed: Allergy & Precautions, H&P , NPO status , Patient's Chart, lab work & pertinent test results, reviewed documented beta blocker date and time   History of Anesthesia Complications Negative for: history of anesthetic complications  Airway Mallampati: II TM Distance: >3 FB Neck ROM: full    Dental   Pulmonary neg pulmonary ROS,  breath sounds clear to auscultation        Cardiovascular negative cardio ROS  Rhythm:regular     Neuro/Psych negative neurological ROS  negative psych ROS   GI/Hepatic negative GI ROS, Neg liver ROS, GERD-  Medicated and Controlled,  Endo/Other  negative endocrine ROS  Renal/GU negative Renal ROS  negative genitourinary   Musculoskeletal   Abdominal   Peds  Hematology negative hematology ROS (+)   Anesthesia Other Findings See surgeon's H&P   Reproductive/Obstetrics negative OB ROS                          Anesthesia Physical Anesthesia Plan  ASA: II  Anesthesia Plan: General   Post-op Pain Management:    Induction: Intravenous  Airway Management Planned: LMA  Additional Equipment:   Intra-op Plan:   Post-operative Plan:   Informed Consent: I have reviewed the patients History and Physical, chart, labs and discussed the procedure including the risks, benefits and alternatives for the proposed anesthesia with the patient or authorized representative who has indicated his/her understanding and acceptance.   Dental Advisory Given  Plan Discussed with: CRNA and Surgeon  Anesthesia Plan Comments:         Anesthesia Quick Evaluation

## 2013-09-03 NOTE — Brief Op Note (Signed)
09/03/2013  10:06 AM  PATIENT:  Edgardo Roys  73 y.o. 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  SURGEON:  Surgeon(s) and Role:    * Cammie Sickle., MD - Primary  PHYSICIAN ASSISTANT:   ASSISTANTS: registered nurse  ANESTHESIA:   general  EBL:  Total I/O In: 300 [I.V.:300] Out: -   BLOOD ADMINISTERED:none  DRAINS: none   LOCAL MEDICATIONS USED:  LIDOCAINE   SPECIMEN:  No Specimen  DISPOSITION OF SPECIMEN:  N/A  COUNTS:  YES  TOURNIQUET:   Total Tourniquet Time Documented: Upper Arm (Right) - 111 minutes Total: Upper Arm (Right) - 111 minutes   DICTATION: .Other Dictation: Dictation Number [id number]  PLAN OF CARE: Discharge to home after PACU  PATIENT DISPOSITION:  PACU - hemodynamically stable.   Delay start of Pharmacological VTE agent (>24hrs) due to surgical blood loss or risk of bleeding: not applicable

## 2013-09-04 ENCOUNTER — Encounter (HOSPITAL_BASED_OUTPATIENT_CLINIC_OR_DEPARTMENT_OTHER): Payer: Self-pay | Admitting: Orthopedic Surgery

## 2013-09-04 NOTE — Op Note (Signed)
NAMECAELIN, ROSEN              ACCOUNT NO.:  [id number]  MEDICAL RECORD NO.:  [id number]  LOCATION:                                 FACILITY:  PHYSICIAN:  Youlanda Mighty. Sayed Apostol, M.D.      DATE OF BIRTH:  DATE OF PROCEDURE:  09/03/2013 DATE OF DISCHARGE:                              OPERATIVE REPORT   PREOPERATIVE DIAGNOSES: 1. 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  2: Intact extensor pollicis longus and extensor to index finger and     separate compartment that was left undisturbed.  OPERATION: 1. Synovectomy and resection of ulnar head, right distal radioulnar     joint. 2. Triple-layer reconstruction of distal radioulnar joint capsule with     flexor digitorum superficialis free tendon graft to augment dorsal     capsule and protect extensor to index finger and extensor pollicis     longus. 3. Transfer of flexor digitorum superficialis around radial border of     forearm deep to radial superficial sensory nerve branches to     reconstruct extension of right long, ring and small fingers.  OPERATING SURGEON:  Youlanda Mighty. Milton Sagona, M.D.  ASSISTANT:  Registered nurse.  ANESTHESIA:  General by LMA supplemented by a lidocaine field block of the wounds postoperatively.  SUPERVISING ANESTHESIOLOGIST:  Jessy Oto. Albertina Parr, M.D.  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  In January of 2015, she was referred for evaluation and management of extensor lag of her right ring and small fingers.  She was noted to  have the Vaughan-Jackson syndrome with synovitis and rupture of three tendons including the extensor digiti minimi and the common extensors to the small and ring fingers.  On May 14, 2013, she was brought to the operating room, where under general anesthesia, she underwent synovectomy and arthroplasty of the distal radioulnar joint, synovial biopsy, and reconstruction of the distal radioulnar joint with a tendon graft augmenting the capsule and an extensor retinacular transposition followed by recreation of extensor power to the ring and small fingers by transfer to the remaining extensor digitorum communis to the long finger.  Her surgery was notable for the absence of an extensor indicis proprius in absence of a palmaris longus muscle.  This was an extremely rare combination of congenital tendon differences.  We ultimately rehabilitated her with static and dynamic splinting.  She recovered full range of motion of her fingers and was doing quite well with full recovery of pronation and supination of the forearm and improving grip strength.  In late April, she was discharged from our care.  Within 1 week while at home, placing glove on her hand, she felt a pop and noted loss of extension to the  long, ring and small fingers.  We advised immediate static splinting to protect the other digits and advised proceeding with reconstruction at this time and anticipating use of the flexor carpi radialis or flexor digitorum superficialis tendon.  We have been waiting for the past month for a Rheumatology consult, in that, with her history of caput ulnae syndrome and Vaughan-Jackson syndrome, we wanted to be absolutely certain that she did not have rheumatoid arthritis or another inflammatory arthropathy.  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  Recently, Mrs. Corpening has experienced swelling and discomfort in an ankle raising the question of a generalized arthritis.  After detailed informed consent in the office and in the holding area as well as a detailed phone conversation on Sep 02, 2013, during which, we outlined the entire surgical and rehabilitation plan, she was brought to the operating room at this time.  Preoperatively, she was interviewed by Dr. Girard Cooter of Anesthesia, who recommended general anesthesia by LMA technique.  DESCRIPTION OF PROCEDURE:  Abryanna Musolino was interviewed in the holding area and her right arm was marked as a proper surgical site per protocol with a marking pen.  She was provided 2 g of Ancef as an IV prophylactic antibiotic.  She was transferred to room #2 of the [facility], where under Dr. Roslynn Amble direct supervision, general anesthesia by LMA technique was induced.  The right upper extremity was prepped with Betadine soap and solution, and sterilely draped.  A pneumatic tourniquet was applied to the proximal right brachium.  Following careful exsanguination of the right arm with an Esmarch bandage, the arterial tourniquet was inflated to 220 mmHg.  Procedure commenced with a routine surgical time-out.  We then excised her prior dorsal surgical scar to explore the extensors.  We found that she had ruptured her extensor digitorum longus directly over the ulnar head.  There was a reformed extensor retinaculum that had formed at the site of the tendon graft, placed in January.  She had separate compartments for the index extensor digitorum communis and her extensor pollicis longus.  These tendons were safe and intact.  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  This was  the etiology of her recurrent rupture.  We had removed all bone spurs and performed a synovectomy in January.  We then elected to proceed with a proper flexor digitorum superficialis transfer.  An incision was made in the palm overlying the interval between the A2 and A1 pulleys of the right long finger flexor retinaculum.  Neurovascular structures were retracted and superficialis tendon identify and confirmed by PIP motion.  We then performed a longitudinal incision in the forearm over the musculotendinous junction of the superficialis tendons, identifying the median nerve and flexor carpi radialis.  We then released the superficialis between the A1 and A2 pulleys, brought it proximally, rerouted it deep to the flexor carpi radialis and superficial to the radial artery and deep to the radial superficial sensory branches.  Great care was taken to assure that the median nerve was not kinked or in any way placed under traction by fascial bands.  A Freer elevator was used to clear all of the fascial structures around  the median nerve prior to transfer.  We confirmed that the radial superficial sensory branches were superficial to the transfer followed by repair of the palmar wound and the forearm wound with intradermal 3-0 Prolene.  We then set about reconstructing extension of the long, ring and small fingers by individual tendon graft placement with Pulvertaft Weave through the extensor digitorum communis to the long and ring fingers and the extensor digitorum communis and extensor digit minimi to the small finger.  Tension was set with the wrist in 20 degrees of extension to have about 10 degrees of MP flexion.  The tenodesis effect was quite satisfactory.  These tendon transfers are known to stretch out over time.  Therefore, we set the tension about 15% tighter than would be expected at our final outcome.  The tendon graft was completed and redundant tendon excised.  We  then went about correcting the caput ulnae and Vaughan-Jackson predicament at the wrist.  A longitudinal incision was fashioned to the pinhole leak and the distal ulna exposed.  There were no recurrent spurs.  There was juicy synovium present.  The ulnar head was removed piecemeal with use of an osteotome, multiple rongeurs and curette.  The styloid was removed and all soft tissues of the triangular fibrocartilage preserved.  The entire capsule of the distal radioulnar joint was preserved as a hinge. We used a C-arm fluoroscope to confirm complete resection followed by repair of the capsule in triple layer with pants-over-vest repair of the native capsule followed by use of the superficialis tendon graft, measured 2 cm long to perform a thick tendon interposition, repair to the extensor retinaculum followed by pants-over-vest repair of the extensor retinaculum.  Our goal is to try to prevent any possible injury to the extensor digitorum to the index finger or extensor pollicis longus.  Ms. Fleener wounds were then irrigated and closed with subcutaneous 4- 0 Vicryl and intradermal 3-0 Prolene segmental sutures.  Steri-Strips were applied followed by infiltration of the wounds with 2% lidocaine for postoperative comfort.  She was then placed in a sugar-tong splint with her forearm and slight supination in the long, ring and small fingers, 10 degrees flexion at the MP joint, full extension at the PIP joint and free at the DIP joint.  There were no apparent complications.  Total tourniquet time was 1 hour and 50 minutes.  There were no apparent complications.  Ms. Ishikawa will be transferred to the recovery room for observation of vital signs and discharged home to the care of her husband.  She was provided prescriptions for Percocet 5 mg 1 p.o. q.4-6 hours p.r.n. pain, 30 tablets without refill; also Keflex 500 mg 1 p.o. q.8 hours x4 days as prophylactic antibiotic.  We will redouble  our efforts to obtain a timely Rheumatology consult.  We have been able to move her appointment up about 1 week, but we will stress to our Rheumatology colleagues the extreme urgency of this consultation.     Youlanda Mighty Viveka Wilmeth, M.D.     RVS/MEDQ  D:  09/03/2013  T:  09/04/2013  Job:  [id number]  cc:   Cresenciano Lick. Renold Genta, M.D. Tobie Lords, MD

## 2013-10-15 ENCOUNTER — Other Ambulatory Visit: Payer: Self-pay

## 2013-10-15 MED ORDER — DOXYCYCLINE MONOHYDRATE 75 MG PO CAPS: 75.0000 mg | ORAL_CAPSULE | ORAL | Status: DC

## 2014-01-11 ENCOUNTER — Other Ambulatory Visit: Payer: 59 | Admitting: Internal Medicine

## 2014-01-11 DIAGNOSIS — Z130 Encounter for screening for diseases of the blood and blood-forming organs and certain disorders involving the immune mechanism: Secondary | ICD-10-CM

## 2014-01-11 DIAGNOSIS — Z13220 Encounter for screening for lipoid disorders: Secondary | ICD-10-CM

## 2014-01-11 DIAGNOSIS — Z13228 Encounter for screening for other metabolic disorders: Secondary | ICD-10-CM

## 2014-01-11 DIAGNOSIS — M899 Disorder of bone, unspecified: Secondary | ICD-10-CM

## 2014-01-11 DIAGNOSIS — Z0000 Encounter for general adult medical examination without abnormal findings: Secondary | ICD-10-CM

## 2014-01-11 DIAGNOSIS — Z1329 Encounter for screening for other suspected endocrine disorder: Secondary | ICD-10-CM

## 2014-01-11 DIAGNOSIS — M949 Disorder of cartilage, unspecified: Secondary | ICD-10-CM

## 2014-01-11 DIAGNOSIS — E785 Hyperlipidemia, unspecified: Secondary | ICD-10-CM

## 2014-01-11 LAB — CBC WITH DIFFERENTIAL/PLATELET
Basophils Absolute: 0 10*3/uL (ref 0.0–0.1)
Basophils Relative: 1 % (ref 0–1)
EOS ABS: 0.2 10*3/uL (ref 0.0–0.7)
EOS PCT: 4 % (ref 0–5)
HCT: 40 % (ref 36.0–46.0)
Hemoglobin: 14 g/dL (ref 12.0–15.0)
LYMPHS ABS: 1.6 10*3/uL (ref 0.7–4.0)
Lymphocytes Relative: 38 % (ref 12–46)
MCH: 33.6 pg (ref 26.0–34.0)
MCHC: 35 g/dL (ref 30.0–36.0)
MCV: 95.9 fL (ref 78.0–100.0)
Monocytes Absolute: 0.3 10*3/uL (ref 0.1–1.0)
Monocytes Relative: 8 % (ref 3–12)
Neutro Abs: 2 10*3/uL (ref 1.7–7.7)
Neutrophils Relative %: 49 % (ref 43–77)
Platelets: 271 10*3/uL (ref 150–400)
RBC: 4.17 MIL/uL (ref 3.87–5.11)
RDW: 14.4 % (ref 11.5–15.5)
WBC: 4.1 10*3/uL (ref 4.0–10.5)

## 2014-01-11 LAB — COMPREHENSIVE METABOLIC PANEL
ALT: 19 U/L (ref 0–35)
AST: 20 U/L (ref 0–37)
Albumin: 4.3 g/dL (ref 3.5–5.2)
Alkaline Phosphatase: 47 U/L (ref 39–117)
BILIRUBIN TOTAL: 0.5 mg/dL (ref 0.2–1.2)
BUN: 12 mg/dL (ref 6–23)
CO2: 28 meq/L (ref 19–32)
CREATININE: 0.59 mg/dL (ref 0.50–1.10)
Calcium: 9.3 mg/dL (ref 8.4–10.5)
Chloride: 106 mEq/L (ref 96–112)
Glucose, Bld: 84 mg/dL (ref 70–99)
Potassium: 4.4 mEq/L (ref 3.5–5.3)
Sodium: 142 mEq/L (ref 135–145)
Total Protein: 6.5 g/dL (ref 6.0–8.3)

## 2014-01-11 LAB — LIPID PANEL
CHOL/HDL RATIO: 2.5 ratio
CHOLESTEROL: 287 mg/dL — AB (ref 0–200)
HDL: 117 mg/dL (ref >39)
LDL Cholesterol: 154 mg/dL — ABNORMAL HIGH (ref 0–99)
Triglycerides: 80 mg/dL (ref <150)
VLDL: 16 mg/dL (ref 0–40)

## 2014-01-11 LAB — TSH: TSH: 1.465 u[IU]/mL (ref 0.350–4.500)

## 2014-01-12 LAB — VITAMIN D 25 HYDROXY (VIT D DEFICIENCY, FRACTURES): VIT D 25 HYDROXY: 76 ng/mL (ref 30–89)

## 2014-01-14 ENCOUNTER — Encounter: Payer: Self-pay | Admitting: Internal Medicine

## 2014-01-14 ENCOUNTER — Ambulatory Visit (INDEPENDENT_AMBULATORY_CARE_PROVIDER_SITE_OTHER): Payer: 59 | Admitting: Internal Medicine

## 2014-01-14 VITALS — BP 138/78 | HR 60 | Ht 63.5 in | Wt 123.0 lb

## 2014-01-14 DIAGNOSIS — Z23 Encounter for immunization: Secondary | ICD-10-CM | POA: Diagnosis not present

## 2014-01-14 DIAGNOSIS — Z0000 Encounter for general adult medical examination without abnormal findings: Secondary | ICD-10-CM | POA: Diagnosis not present

## 2014-01-14 LAB — POCT URINALYSIS DIPSTICK
Bilirubin, UA: NEGATIVE
Glucose, UA: NEGATIVE
Ketones, UA: NEGATIVE
Leukocytes, UA: NEGATIVE
Nitrite, UA: NEGATIVE
PH UA: 7
Protein, UA: NEGATIVE
RBC UA: NEGATIVE
Urobilinogen, UA: NEGATIVE

## 2014-01-14 NOTE — Progress Notes (Signed)
Subjective:    Patient ID: [name], female    DOB: 1941-03-03, 73 y.o.   MRN: [id number]  HPI Pleasant 73 year old White Female in today for health maintenance and evaluation of medical issues.   No history of hospitalizations other than childbirth.  History of environmental allergies.  Sees Dr. Andreas Blower for athletic injuries. 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  In January 2015 she had surgery by Dr. Daylene Katayama for  [name] Syndrome with surgery for rupture of extensor digitorum longus right ring finger and rupture extensor digiti minimus right  small finger  In May 2015 she had surgery once again by Dr. Daylene Katayama for rupture extensor digitorum longus right ring finger. Since then she's done well.  Remote history of plantar fasciitis. Takes aspirin 2 or 3 times weekly.  Dr. Teena Irani is GI physician.   Pneumovax 2006, Zostavax 2008, flu vaccine given today  Takes Miralax twice a week for constipation  Social history: She is married to [employer] well-known: Chemical engineer. She has a 4 year college degree. One adult son who lives in [place] and is a Therapist, nutritional. Daily social alcohol consumption. Does not smoke. Native of [place], [place]  Family history: Father died at age 20 of complications of emphysema. Mother died at age 44 of heart failure. No brothers or sisters.     Review of Systems  Constitutional: Negative.   All other systems reviewed and are negative.      Objective:   Physical Exam  Constitutional: She is oriented to person, place, and time. She appears well-developed and well-nourished. No distress.  HENT:  Head: Normocephalic and atraumatic.  Right Ear: External ear normal.  Left Ear: External ear normal.  Mouth/Throat: Oropharynx is clear and moist. No oropharyngeal exudate.  Eyes: Conjunctivae and EOM are normal. Pupils are equal, round, and reactive to light. Right eye exhibits no discharge. Left eye exhibits no discharge. No  scleral icterus.  Neck: Neck supple. No JVD present. No thyromegaly present.  Cardiovascular: Normal rate, regular rhythm and normal heart sounds.   No murmur heard. Occasional extrasystole  Pulmonary/Chest: Effort normal and breath sounds normal. No respiratory distress. She has no rales. She exhibits no tenderness.  Abdominal: Soft. Bowel sounds are normal. She exhibits no distension and no mass. There is no rebound and no guarding.  Genitourinary:  Pap done 2014  Musculoskeletal: Normal range of motion. She exhibits no edema.  Lymphadenopathy:    She has no cervical adenopathy.  Neurological: She is alert and oriented to person, place, and time. She has normal reflexes. No cranial nerve deficit. Coordination normal.  Skin: Skin is warm and dry. No rash noted. She is not diaphoretic.  Psychiatric: She has a normal mood and affect. Her behavior is normal. Judgment and thought content normal.  Vitals reviewed.         Assessment & Plan:  Right hand surgery 2 by Dr. Daylene Katayama 2015  History of chronic constipation treated with Miralax  History of tinnitus  Plan: Return in one year or as needed.  Subjective:   Patient presents for Medicare Annual/Subsequent preventive examination.  Review Past Medical/Family/Social:   Risk Factors  Current exercise habits:  Dietary issues discussed:   Cardiac risk factors:  Depression Screen  (Note: if answer to either of the following is "Yes", a more complete depression screening is indicated)   Over the past two weeks, have you felt down, depressed or hopeless? No  Over the  past two weeks, have you felt little interest or pleasure in doing things? No Have you lost interest or pleasure in daily life? No Do you often feel hopeless? No Do you cry easily over simple problems? No   Activities of Daily Living  In your present state of health, do you have any difficulty performing the following activities?:   Driving? No  Managing  money? No  Feeding yourself? No  Getting from bed to chair? No  Climbing a flight of stairs? No  Preparing food and eating?: No  Bathing or showering? No  Getting dressed: No  Getting to the toilet? No  Using the toilet:No  Moving around from place to place: No  In the past year have you fallen or had a near fall?:No  Are you sexually active?yes Do you have more than one partner? No   Hearing Difficulties: No  Do you often ask people to speak up or repeat themselves? sometimes Do you experience ringing or noises in your ears? yes Do you have difficulty understanding soft or whispered voices? yes Do you feel that you have a problem with memory? No Do you often misplace items? sometimes   Home Safety:  Do you have a smoke alarm at your residence? Yes Do you have grab bars in the bathroom? no Do you have throw rugs in your house? yes   Cognitive Testing  Alert? Yes Normal Appearance?Yes  Oriented to person? Yes Place? Yes  Time? Yes  Recall of three objects? Yes  Can perform simple calculations? Yes  Displays appropriate judgment?Yes  Can read the correct time from a watch face?Yes   List the Names of Other Physician/Practitioners you currently use:  See referral list for the physicians patient is currently seeing.  Dr. Oneida Alar   Review of Systems: As above   Objective:     General appearance: Appears stated age  Head: Normocephalic, without obvious abnormality, atraumatic  Eyes: conj clear, EOMi PEERLA  Ears: normal TM's and external ear canals both ears  Nose: Nares normal. Septum midline. Mucosa normal. No drainage or sinus tenderness.  Throat: lips, mucosa, and tongue normal; teeth and gums normal  Neck: no adenopathy, no carotid bruit, no JVD, supple, symmetrical, trachea midline and thyroid not enlarged, symmetric, no tenderness/mass/nodules  No CVA tenderness.  Lungs: clear to auscultation bilaterally  Breasts: normal appearance, no masses or  tenderness Heart: regular rate and rhythm, S1, S2 normal, no murmur, click, rub or gallop  Abdomen: soft, non-tender; bowel sounds normal; no masses, no organomegaly  Musculoskeletal: ROM normal in all joints, no crepitus, no deformity, Normal muscle strengthen. Back  is symmetric, no curvature. Skin: Skin color, texture, turgor normal. No rashes or lesions  Lymph nodes: Cervical, supraclavicular, and axillary nodes normal.  Neurologic: CN 2 -12 Normal, Normal symmetric reflexes. Normal coordination and gait  Psych: Alert & Oriented x 3, Mood appear stable.    Assessment:    Annual wellness medicare exam   Plan:    During the course of the visit the patient was educated and counseled about appropriate screening and preventive services including:   Annual mammogram  Patient says Dr. Amedeo Plenty does not feel she needs further screening colonoscopies     Patient Instructions (the written plan) was given to the patient.  Medicare Attestation  I have personally reviewed:  The patient's medical and social history  Their use of alcohol, tobacco or illicit drugs  Their current medications and supplements  The patient's functional ability including ADLs,fall  risks, home safety risks, cognitive, and hearing and visual impairment  Diet and physical activities  Evidence for depression or mood disorders  The patient's weight, height, BMI, and visual acuity have been recorded in the chart. I have made referrals, counseling, and provided education to the patient based on review of the above and I have provided the patient with a written personalized care plan for preventive services.

## 2014-01-15 ENCOUNTER — Encounter: Payer: 59 | Admitting: Internal Medicine

## 2014-03-26 ENCOUNTER — Encounter: Payer: Self-pay | Admitting: Internal Medicine

## 2014-03-26 NOTE — Patient Instructions (Signed)
Return in one year or as needed. Flu vaccine given.

## 2014-05-01 ENCOUNTER — Other Ambulatory Visit: Payer: Self-pay | Admitting: Internal Medicine

## 2014-05-02 NOTE — Telephone Encounter (Signed)
Please check and see if pt still wants this Rx. Note this dose is 5 mg and EPIC says pt is taking 10 mg. May need to correct dose.

## 2014-05-03 NOTE — Telephone Encounter (Signed)
Patient states she does not need a refill on this medication she has a full bottle at home she is not sure of the dosage she says its at home

## 2014-05-17 ENCOUNTER — Telehealth: Payer: Self-pay | Admitting: Internal Medicine

## 2014-05-17 ENCOUNTER — Telehealth: Payer: Self-pay | Admitting: *Deleted

## 2014-05-17 NOTE — Telephone Encounter (Signed)
Has a bad cold, no fever.  Has cough/congestion since Friday.  Advised her mucus is somewhat yellow.  Fred had some [employer]  She feels these would be helpful for her.  She has been taking some of his that are about 76 years old.  Advised patient that it would be best for her to come in and be treated since her mucus is somewhat colored.  And, since she does have cough/congestion.  Do you agree with this?  Patient doesn't feel she needs to come in.

## 2014-05-17 NOTE — Telephone Encounter (Signed)
Appointment given for 05/18/14 @ 1145.

## 2014-05-17 NOTE — Telephone Encounter (Signed)
See tomorrow

## 2014-05-17 NOTE — Telephone Encounter (Signed)
Patient schedule to be seen 05/18/14 at 11:45

## 2014-05-18 ENCOUNTER — Ambulatory Visit (INDEPENDENT_AMBULATORY_CARE_PROVIDER_SITE_OTHER): Payer: Medicare Other | Admitting: Internal Medicine

## 2014-05-18 ENCOUNTER — Encounter: Payer: Self-pay | Admitting: Internal Medicine

## 2014-05-18 VITALS — BP 110/74 | HR 55 | Temp 98.4°F | Wt 122.0 lb

## 2014-05-18 DIAGNOSIS — J0100 Acute maxillary sinusitis, unspecified: Secondary | ICD-10-CM

## 2014-05-18 DIAGNOSIS — J209 Acute bronchitis, unspecified: Secondary | ICD-10-CM

## 2014-05-18 MED ORDER — LEVOFLOXACIN 500 MG PO TABS: 500.0000 mg | ORAL_TABLET | Freq: Every day | ORAL | Status: DC

## 2014-05-18 MED ORDER — BENZONATATE 100 MG PO CAPS: 200.0000 mg | ORAL_CAPSULE | Freq: Three times a day (TID) | ORAL | Status: DC | PRN

## 2014-05-18 NOTE — Patient Instructions (Signed)
Levaquin 500 milligrams daily for 10 days. Tessalon Perles 200 mg 3 times daily as needed for cough. Rest and drink plenty of fluids.

## 2014-05-18 NOTE — Progress Notes (Signed)
   Subjective:    Patient ID: [name], female    DOB: 04/19/1941, 74 y.o.   MRN: [id number]  HPI 5 day history of URI symptoms. No fever or shaking chills. Has cough and nasal congestion. Sounds nasally congested when she speaks. Has discolored sputum and nasal drainage.     Review of Systems     Objective:   Physical Exam  Skin warm and dry. Nodes none. Pharynx clear. TMs are clear. Neck is supple. Chest clear without rales or wheezing      Assessment & Plan:  Acute sinusitis  Acute bronchitis  Plan: Levaquin 500 milligrams daily for 10 days. Tessalon Perles 200 mg 3 times a day when necessary cough. Rest and drink plenty of fluids.

## 2014-05-19 ENCOUNTER — Telehealth: Payer: Self-pay | Admitting: Internal Medicine

## 2014-05-19 MED ORDER — ALBUTEROL SULFATE HFA 108 (90 BASE) MCG/ACT IN AERS: 2.0000 | INHALATION_SPRAY | Freq: Four times a day (QID) | RESPIRATORY_TRACT | Status: DC | PRN

## 2014-05-19 NOTE — Telephone Encounter (Signed)
Patient has been wheezing all night.  Tessalon Perles seems to be helping.  Spoke with Dr. Renold Genta.    Call in Inhaler 2 Puffs 4 times daily.  Routed to [place].    Pharmacy:  [facility] @ [place].

## 2014-05-19 NOTE — Telephone Encounter (Signed)
Spoke with patient informed her we have sent in script for Albuterol Inhaler . Patient given instructions for use .

## 2014-05-31 ENCOUNTER — Other Ambulatory Visit: Payer: Self-pay | Admitting: Internal Medicine

## 2014-07-19 ENCOUNTER — Other Ambulatory Visit: Payer: Self-pay | Admitting: Internal Medicine

## 2014-08-18 ENCOUNTER — Telehealth: Payer: Self-pay | Admitting: Internal Medicine

## 2014-08-18 NOTE — Telephone Encounter (Signed)
She received a letter today from Dr. Tonette Bihari office that he will be leaving the practice and medicine and going into a new field of Economics, Armed forces logistics/support/administrative officer and Music.  She wants to know if she should remain in their practice and if so, with whom?  Or, should she look for another rheumotologist?  If so, who would you recommend?

## 2014-08-19 NOTE — Telephone Encounter (Signed)
Spoke with patient and advised that Drs. Amil Amen and [place] are both in the same practice and would both be great options to take over her care.  Patient is quite grateful for the suggestion of care.

## 2014-08-19 NOTE — Telephone Encounter (Signed)
He has a partner that should be accepting his patients that is quite good.

## 2014-10-28 ENCOUNTER — Encounter: Payer: Self-pay | Admitting: Sports Medicine

## 2014-10-28 ENCOUNTER — Other Ambulatory Visit: Payer: Self-pay | Admitting: Internal Medicine

## 2014-10-28 ENCOUNTER — Ambulatory Visit (INDEPENDENT_AMBULATORY_CARE_PROVIDER_SITE_OTHER): Payer: Medicare Other | Admitting: Sports Medicine

## 2014-10-28 VITALS — BP 117/67 | HR 63 | Ht 64.0 in | Wt 118.0 lb

## 2014-10-28 DIAGNOSIS — G8929 Other chronic pain: Secondary | ICD-10-CM

## 2014-10-28 DIAGNOSIS — M25572 Pain in left ankle and joints of left foot: Secondary | ICD-10-CM | POA: Diagnosis not present

## 2014-10-28 DIAGNOSIS — M25531 Pain in right wrist: Secondary | ICD-10-CM

## 2014-10-28 NOTE — Assessment & Plan Note (Signed)
She had surgical repair for rupture of ext tendons on RT hand Now with probably RA and bilat hand and wrist changes Use wrist loops  Use warm water baths

## 2014-10-28 NOTE — Progress Notes (Signed)
Subjective:     Patient ID: [name], female   DOB: Jan 21, 1941, 74 y.o.   MRN: [id number]  HPI  [name] is a pleasant 74 yo female presenting for follow up for chronic left ankle pain. She has been unable to walk for exercise for the past 6 months due to pain. She has also had consistent swelling of her ankle. She wears a body helix compression sleeve, uses aspirin, and ices her ankle regularly, which improves the swelling but does not help alleviate the pain. She got a cortisone shot in her left ankle by Dr. Ouida Sills in Jan 2016, which helped to relieve her pain for several weeks. She has been exercising with a personal trainer twice a week.  Past Hx of frequent ankle sprains  Has been in orthotics for foot breakdown and uses MT pads bilat  Has been Dx with RA and this is leading to chronic pain in hands and wrists Review of Systems Per HPI.    Objective:   Physical Exam NAD BP 117/67 mmHg  Pulse 63  Ht 5' 4"  (1.626 m)  Wt 118 lb (53.524 kg)  BMI 20.24 kg/m2    MSK L Ankle:  No visible erythema. Full ROM. 5/5 plantarflexion, dorsiflexion, inversion and eversion of ankle. Talar dome nontender. Dropped transverse arches. Swelling in sinus tarsi.  No tenderness on posterior aspects of lateral or medial malleolus, over cuboid, or at base of 5th MT. PF/Inversion was dec about 10 deg vs RT/  Drawer was stable  R ankle: calluses noted on R metarsal heads, tran arch flattened   R hand: swollen PIP joints, MCP joints.   L wrist: no swelling or erythema noted. Distal nodules on DIP joints bilat  U/S L ankle: Small fracture over lateral malleolus. Peroneal tendon appears normal. Sinus tarsi with effusion. Spur in lateral ankle joint. 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   Plan:     We will schedule her for new orthotics and encourage her to ice, wear her  compression sleeve with activity, and continue easy motion exercises. If the pain in her ankle worsens or becomes unbearable, she can get a second corticosteroid injection in her ankle. For the pain in her hands, she can soak her hands in olive oil with hot water to ease her arthritic joint pain.

## 2014-10-28 NOTE — Assessment & Plan Note (Signed)
Use compression  Renew orthoitcs  Cross train as swelling will limit walking

## 2014-11-02 ENCOUNTER — Telehealth: Payer: Self-pay | Admitting: Internal Medicine

## 2014-11-02 NOTE — Telephone Encounter (Signed)
Follow Dr. Amil Amen

## 2014-11-02 NOTE — Telephone Encounter (Signed)
Patient called regarding Dr. Meda Coffee (she is being passed on from Dr. Amil Amen to this Rheumatologist within the practice).  She is not getting a good feeling regarding Dr. Meda Coffee.  She wants to know your opinion.  Should she stay with the practice and see Dr. Meda Coffee who they have assigned her to see. OR, should she move with Dr. Amil Amen to the practice where he is going and continue to see him there?  OR, do you feel there is someone else who would be better suited to see her for her needs?  She values your opinion and wants to know your feedback please.

## 2014-11-02 NOTE — Telephone Encounter (Signed)
Let Patient know if she feels more comfortable with Dr Amil Amen she should follow him. Patient verbalized understanding

## 2014-11-05 ENCOUNTER — Telehealth: Payer: Self-pay | Admitting: Internal Medicine

## 2014-11-05 NOTE — Telephone Encounter (Signed)
Pharmacists called just to make you aware that they are changing her Doxycycline capsules 52m to TABLETS because capsules have been on order and haven't been shipped.  If you are NOT happy with that change, we can call back and let them know.  Otherwise, this note is just an [place]    Thanks.

## 2014-11-25 ENCOUNTER — Ambulatory Visit (INDEPENDENT_AMBULATORY_CARE_PROVIDER_SITE_OTHER): Payer: Medicare Other | Admitting: Sports Medicine

## 2014-11-25 ENCOUNTER — Encounter: Payer: Self-pay | Admitting: Sports Medicine

## 2014-11-25 VITALS — BP 109/48 | Ht 64.0 in | Wt 118.0 lb

## 2014-11-25 DIAGNOSIS — M23204 Derangement of unspecified medial meniscus due to old tear or injury, left knee: Secondary | ICD-10-CM | POA: Diagnosis not present

## 2014-11-25 DIAGNOSIS — M25562 Pain in left knee: Secondary | ICD-10-CM

## 2014-11-25 DIAGNOSIS — G8929 Other chronic pain: Secondary | ICD-10-CM | POA: Diagnosis not present

## 2014-11-25 DIAGNOSIS — M25572 Pain in left ankle and joints of left foot: Secondary | ICD-10-CM

## 2014-11-25 NOTE — Progress Notes (Signed)
Patient ID: [name], female   DOB: February 24, 1941, 74 y.o.   MRN: [id number]  Patient with a couple of mos of increasing Left medial knee pain She continues working with a Physiological scientist and has done very well She does do squats and lunges  She is unsure of any specific injury She does get pain with too much walking or with going down steps  She has an arthritic ankle on the left for which she uses a compression sleeve  Physical examination No acute distress BP 109/48 mmHg  Ht 5' 4"  (1.626 m)  Wt 118 lb (53.524 kg)  BMI 20.24 kg/m2  Knee: Normal to inspection with no erythema or effusion or obvious bony abnormalities. Palpation shows mild medial joint line tenderness  No patellar tenderness or condyle tenderness. ROM normal in flexion and extension and lower leg rotation. Ligaments with solid consistent endpoints including ACL, PCL, LCL, MCL. Negative Mcmurray's for pain but this does cause clicking Non painful patellar compression. Patellar and quadriceps tendons unremarkable. Hamstring and quadriceps strength is normal.  Ultrasound of left knee There is a very small effusion in the suprapatellar pouch Quadriceps and patellar tendons are normal Lateral meniscus is normal In the midportion of the medial joint line there is some calcification and degenerative change in the meniscus There is slight swelling around this area of the meniscus  A repeated scan of lateral left ankle Still mild swelling Small avulsion fragments anterior and distal to the lateral malleolus

## 2014-11-25 NOTE — Assessment & Plan Note (Signed)
This seems pretty stable using an ankle compression sleeve  Keep doing balance exercises to help strengthen pain control left ankle position

## 2014-11-25 NOTE — Patient Instructions (Signed)
You have a small are of cartilage on inside of your knee that is irritated and has some swelling  Use a compression sleeve when exercising or walking a lot  OK to walk  Avoid deeper knee bends but you can continue your work with Gershon Mussel as long as no real pain or going too deep on knee flexion - greater than 45 degrees.  After 6 weeks can probably go back to usual routine  Ankle looks stable to me  You have small avulsion fractures (about 1/2 cm of bone pulled off) from old falls Use ankle support  Practice ankle balance  If not better in 6 weeks let me recheck

## 2014-11-25 NOTE — Assessment & Plan Note (Signed)
The degree of swelling is very small and she has no instability  I recommended conservative care with compression sleeve  Continue working with a Physiological scientist but decrease the amount of knee flexion  Recheck in 4 weeks

## 2014-12-21 ENCOUNTER — Encounter: Payer: Self-pay | Admitting: Sports Medicine

## 2014-12-21 ENCOUNTER — Ambulatory Visit (INDEPENDENT_AMBULATORY_CARE_PROVIDER_SITE_OTHER): Payer: Medicare Other | Admitting: Sports Medicine

## 2014-12-21 VITALS — BP 98/63 | HR 51 | Ht 64.0 in | Wt 118.0 lb

## 2014-12-21 DIAGNOSIS — M7742 Metatarsalgia, left foot: Secondary | ICD-10-CM

## 2014-12-21 DIAGNOSIS — M7741 Metatarsalgia, right foot: Secondary | ICD-10-CM

## 2014-12-21 DIAGNOSIS — M25572 Pain in left ankle and joints of left foot: Secondary | ICD-10-CM | POA: Diagnosis not present

## 2014-12-21 DIAGNOSIS — M79601 Pain in right arm: Secondary | ICD-10-CM | POA: Diagnosis not present

## 2014-12-21 DIAGNOSIS — G8929 Other chronic pain: Secondary | ICD-10-CM | POA: Diagnosis not present

## 2014-12-21 NOTE — Progress Notes (Signed)
  [name] - 74 y.o. female MRN [id number]  Date of birth: 10-06-1940   [name] is a 74 y.o. female who presents today for orthotics. She has a hx of chronic left ankle pain. She wears a body helix on that ankle. She has had to limit the amount of time that she is able to walk secondary to pain. She started walking again recently and is only able to walk for about 15-20 minutes before starting to have pain. She was placed in orthotics for foot breakdown with a metatarsal pad about 6-7 years ago. Recnet visit showed that the arch support material had broken down in these older orthotics.   PMHx -  reviewed.  Contributory factors include: RA PSHx -  reviewed.  Contributory factors include:  Ulnar head excision right hand 2015, repair extensor tendon of right 4th and 5th digit 2015, tendon transfer right flexor carpis radialis to 4th and 5th digit 2015.  FHx -  reviewed.   Medications - plaquenil   ROS Per HPI   Exam:  Filed Vitals:   12/21/14 1413  BP: 98/63  Pulse: 51   Gen: NAD Cardiorespiratory - Normal respiratory effort/rate.   Foot Exam:  Laterality: left Appearance: no erythema or gross deformity, loss of transverse arch of b/l feet, callus formation on the medial aspect of great toe of left foot widening of forefoot. Edema: effusion noted on left ankle compared to right.  Tenderness: no TTP of base of 5th digit, lateral or medial malleolus, or navicular bone  Range of Motion: normal in plantar/dorsal flexion, inversion, eversion  Neurovascularly intact: yes  Arch: pes cavus  Strength:  Dorsiflexion: 5/5 Plantarflexion: 5/5 Inversion: 5/5 Eversion: 5/5    Imaging:  10/28/14: [place] left ankle: small fx of lateral malleolus. 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

## 2014-12-21 NOTE — Assessment & Plan Note (Addendum)
Patient is still having some ankle pain which has had some improvement with the body helix and orthotics.  - She will continue to wear a body helix on her left ankle.   Patient was fitted for a cushioned, semi-rigid orthotic. The orthotic was heated and afterward the patient stood on the orthotic blank positioned on the orthotic stand. The patient was positioned in subtalar neutral position and 10 degrees of ankle dorsiflexion in a weight bearing stance. Size: 6 Red EVA with heel and forefoot supercell cushion Base: none Additional Posting and Padding: Metatarsal pads placed bilaterally The patient ambulated these, and they were very comfortable.  I spent 40 minutes with this patient, greater than 50% was face-to-face time counseling regarding the below diagnosis.

## 2014-12-21 NOTE — Assessment & Plan Note (Signed)
New MT pads added  Cont to use in all shoes

## 2015-01-27 ENCOUNTER — Ambulatory Visit (INDEPENDENT_AMBULATORY_CARE_PROVIDER_SITE_OTHER): Payer: Medicare Other | Admitting: Internal Medicine

## 2015-01-27 DIAGNOSIS — Z23 Encounter for immunization: Secondary | ICD-10-CM

## 2015-02-06 ENCOUNTER — Other Ambulatory Visit: Payer: Self-pay | Admitting: Internal Medicine

## 2015-02-06 NOTE — Telephone Encounter (Signed)
Why does pt need refill?

## 2015-02-09 ENCOUNTER — Ambulatory Visit (INDEPENDENT_AMBULATORY_CARE_PROVIDER_SITE_OTHER): Payer: Medicare Other | Admitting: Internal Medicine

## 2015-02-09 VITALS — Temp 97.6°F

## 2015-02-09 DIAGNOSIS — Z23 Encounter for immunization: Secondary | ICD-10-CM

## 2015-02-23 ENCOUNTER — Ambulatory Visit: Payer: Medicare Other | Admitting: Sports Medicine

## 2015-03-03 ENCOUNTER — Encounter: Payer: Self-pay | Admitting: Internal Medicine

## 2015-03-28 ENCOUNTER — Other Ambulatory Visit: Payer: Medicare Other | Admitting: Internal Medicine

## 2015-03-28 DIAGNOSIS — E785 Hyperlipidemia, unspecified: Secondary | ICD-10-CM

## 2015-03-28 DIAGNOSIS — M8580 Other specified disorders of bone density and structure, unspecified site: Secondary | ICD-10-CM

## 2015-03-28 DIAGNOSIS — Z79899 Other long term (current) drug therapy: Secondary | ICD-10-CM

## 2015-03-28 DIAGNOSIS — Z0000 Encounter for general adult medical examination without abnormal findings: Secondary | ICD-10-CM

## 2015-03-28 LAB — COMPLETE METABOLIC PANEL WITH GFR
ALT: 12 U/L (ref 6–29)
AST: 14 U/L (ref 10–35)
Albumin: 3.9 g/dL (ref 3.6–5.1)
Alkaline Phosphatase: 49 U/L (ref 33–130)
BUN: 11 mg/dL (ref 7–25)
CALCIUM: 9.2 mg/dL (ref 8.6–10.4)
CO2: 24 mmol/L (ref 20–31)
Chloride: 102 mmol/L (ref 98–110)
Creat: 0.63 mg/dL (ref 0.60–0.93)
GFR, Est African American: >89 mL/min (ref >=60)
GFR, Est Non African American: 87 mL/min (ref >=60)
Glucose, Bld: 97 mg/dL (ref 65–99)
POTASSIUM: 4.2 mmol/L (ref 3.5–5.3)
Sodium: 139 mmol/L (ref 135–146)
Total Bilirubin: 0.5 mg/dL (ref 0.2–1.2)
Total Protein: 6.5 g/dL (ref 6.1–8.1)

## 2015-03-28 LAB — CBC WITH DIFFERENTIAL/PLATELET
Basophils Absolute: 0.1 10*3/uL (ref 0.0–0.1)
Basophils Relative: 1 % (ref 0–1)
EOS PCT: 9 % — AB (ref 0–5)
Eosinophils Absolute: 0.5 10*3/uL (ref 0.0–0.7)
HEMATOCRIT: 41 % (ref 36.0–46.0)
Hemoglobin: 14 g/dL (ref 12.0–15.0)
LYMPHS ABS: 1.3 10*3/uL (ref 0.7–4.0)
LYMPHS PCT: 25 % (ref 12–46)
MCH: 33 pg (ref 26.0–34.0)
MCHC: 34.1 g/dL (ref 30.0–36.0)
MCV: 96.7 fL (ref 78.0–100.0)
MONO ABS: 0.7 10*3/uL (ref 0.1–1.0)
MPV: 8.6 fL (ref 8.6–12.4)
Monocytes Relative: 14 % — ABNORMAL HIGH (ref 3–12)
Neutro Abs: 2.6 10*3/uL (ref 1.7–7.7)
Neutrophils Relative %: 51 % (ref 43–77)
Platelets: 216 10*3/uL (ref 150–400)
RBC: 4.24 MIL/uL (ref 3.87–5.11)
RDW: 13.5 % (ref 11.5–15.5)
WBC: 5.1 10*3/uL (ref 4.0–10.5)

## 2015-03-28 LAB — TSH: TSH: 2.323 u[IU]/mL (ref 0.350–4.500)

## 2015-03-28 LAB — LIPID PANEL
CHOL/HDL RATIO: 2.5 ratio (ref <=5.0)
CHOLESTEROL: 224 mg/dL — AB (ref 125–200)
HDL: 91 mg/dL (ref >=46)
LDL Cholesterol: 117 mg/dL (ref <130)
Triglycerides: 79 mg/dL (ref <150)
VLDL: 16 mg/dL (ref <30)

## 2015-03-29 ENCOUNTER — Ambulatory Visit (INDEPENDENT_AMBULATORY_CARE_PROVIDER_SITE_OTHER): Payer: Medicare Other | Admitting: Internal Medicine

## 2015-03-29 ENCOUNTER — Encounter: Payer: Self-pay | Admitting: Internal Medicine

## 2015-03-29 VITALS — BP 116/60 | HR 61 | Temp 99.0°F | Resp 20 | Ht 64.0 in | Wt 120.0 lb

## 2015-03-29 DIAGNOSIS — Z0000 Encounter for general adult medical examination without abnormal findings: Secondary | ICD-10-CM

## 2015-03-29 DIAGNOSIS — R0989 Other specified symptoms and signs involving the circulatory and respiratory systems: Secondary | ICD-10-CM

## 2015-03-29 DIAGNOSIS — H918X9 Other specified hearing loss, unspecified ear: Secondary | ICD-10-CM

## 2015-03-29 DIAGNOSIS — M8580 Other specified disorders of bone density and structure, unspecified site: Secondary | ICD-10-CM | POA: Diagnosis not present

## 2015-03-29 DIAGNOSIS — S93401A Sprain of unspecified ligament of right ankle, initial encounter: Secondary | ICD-10-CM | POA: Diagnosis not present

## 2015-03-29 DIAGNOSIS — M25572 Pain in left ankle and joints of left foot: Secondary | ICD-10-CM

## 2015-03-29 DIAGNOSIS — H9190 Unspecified hearing loss, unspecified ear: Secondary | ICD-10-CM

## 2015-03-29 DIAGNOSIS — K5900 Constipation, unspecified: Secondary | ICD-10-CM | POA: Diagnosis not present

## 2015-03-29 DIAGNOSIS — E7889 Other lipoprotein metabolism disorders: Secondary | ICD-10-CM

## 2015-03-29 LAB — POCT URINALYSIS DIPSTICK
Bilirubin, UA: NEGATIVE
Blood, UA: NEGATIVE
Glucose, UA: NEGATIVE
Ketones, UA: NEGATIVE
Leukocytes, UA: NEGATIVE
Nitrite, UA: NEGATIVE
Protein, UA: NEGATIVE
SPEC GRAV UA: 1.02
Urobilinogen, UA: 0.2
pH, UA: 6

## 2015-03-29 LAB — VITAMIN D 25 HYDROXY (VIT D DEFICIENCY, FRACTURES): Vit D, 25-Hydroxy: 57 ng/mL (ref 30–100)

## 2015-03-30 ENCOUNTER — Encounter: Payer: Self-pay | Admitting: Internal Medicine

## 2015-03-30 NOTE — Patient Instructions (Signed)
It was a pleasure to see you today. Return in one year or as needed.

## 2015-03-30 NOTE — Progress Notes (Signed)
Subjective:    Patient ID: [name], female    DOB: [birth year], 74 y.o.   MRN: [id number]  HPI Pleasant 74 year old White Female in today for health maintenance exam and evaluation of medical issues. Recently she had a fall and sprained her ankle. This is improving. Bone density results are stable with very mild osteopenia. This was reviewed with her today. Blood pressure is excellent. Immunizations are up-to-date.  No hospitalizations other than childbirth. History of environmental allergies. Sees Dr. Eustace Moore is smoke or [place] for athletic injuries. Most recently saw him for chronic left ankle pain in August. Has orthotics in a brace. 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 Since then she's done well.  Remote history of plantar fasciitis.  Dr. Teena Irani is gastroenterologist.  Linward Headland twice weekly for constipation.     Social history: She is married to [name], well-known Probation officer. She has a 4 year college degree. One adult son who lives in [place] and is a Therapist, nutritional. Social alcohol consumption. Does not smoke. Native of [place].  Family history: Father died at age 14 of complications of COPD. Mother died at age 78 of heart failure. No brothers or sisters.      Review of Systems  Constitutional: Negative.   All other systems reviewed and are negative.      Objective:   Physical Exam  Constitutional: She is oriented to person, place, and time. She appears well-developed and well-nourished. No distress.  HENT:  Head: Normocephalic and atraumatic.  Right Ear: External ear normal.  Left Ear: External ear normal.  Mouth/Throat: Oropharynx  is clear and moist. No oropharyngeal exudate.  Eyes: Conjunctivae and EOM are normal. Pupils are equal, round, and reactive to light. Right eye exhibits no discharge. Left eye exhibits no discharge.  Neck: Neck supple. No JVD present. No thyromegaly present.  Cardiovascular: Normal rate, regular rhythm, normal heart sounds and intact distal pulses.   No murmur heard. Pulmonary/Chest: Effort normal and breath sounds normal. She has no wheezes. She has no rales.  Breasts normal female  Abdominal: Soft. Bowel sounds are normal. She exhibits no distension and no mass. There is no tenderness. There is no rebound and no guarding.  Genitourinary:  Pap deferred due to age  Musculoskeletal: She exhibits no edema.  Recent right ankle sprain with medial tenderness. Improving.  Lymphadenopathy:    She has no cervical adenopathy.  Neurological: She is alert and oriented to person, place, and time. She has normal reflexes. No cranial nerve deficit. Coordination normal.  Skin: Skin is warm and dry. No rash noted. She is not diaphoretic.  Psychiatric: She has a normal mood and affect. Her behavior is normal. Judgment and thought content normal.  Vitals reviewed.         Assessment & Plan:  Normal health maintenance exam  Chronic left ankle pain  Osteopenia  History of constipation  Elevated HDL  Recent right ankle sprain secondary to a fall-improving  Plan: Return in one year or as needed. Lab work reviewed with her and is within normal limits. She has a high HDL cholesterol Making total cholesterol  high at 224 but she has  normal LDL cholesterol. HDL is 91.  Subjective:   Patient presents for Medicare Annual/Subsequent preventive examination.  Review Past Medical/Family/Social: See above  Risk Factors  Current exercise habits: Has personal trainer and stays active Dietary issues discussed: Low fat low carbohydrate  Cardiac risk factors: none  Depression Screen  (Note: if answer  to either of the following is "Yes", a more complete depression screening is indicated)   Over the past two weeks, have you felt down, depressed or hopeless? No  Over the past two weeks, have you felt little interest or pleasure in doing things? No Have you lost interest or pleasure in daily life? No Do you often feel hopeless? No Do you cry easily over simple problems? No   Activities of Daily Living  In your present state of health, do you have any difficulty performing the following activities?:   Driving? No  Managing money? No  Feeding yourself? No  Getting from bed to chair? No  Climbing a flight of stairs? No  Preparing food and eating?: No  Bathing or showering? No  Getting dressed: No  Getting to the toilet? No  Using the toilet:No  Moving around from place to place: No  In the past year have you fallen or had a near fall?: two falls in past year.  One 2 weeks ago and  One 7 months ago Are you sexually active? yes Do you have more than one partner? No   Hearing Difficulties: No  Do you often ask people to speak up or repeat themselves? No  Do you experience ringing or noises in your ears? yes Do you have difficulty understanding soft or whispered voices? Sometimes Do you feel that you have a problem with memory? No Do you often misplace items? No    Home Safety:  Do you have a smoke alarm at your residence? Yes Do you have grab bars in the bathroom? yes Do you have throw rugs in your house? Yes   Cognitive Testing  Alert? Yes Normal Appearance?Yes  Oriented to person? Yes Place? Yes  Time? Yes  Recall of three objects? Yes  Can perform simple calculations? Yes  Displays appropriate judgment?Yes  Can read the correct time from a watch face?Yes   List the Names of Other Physician/Practitioners you currently use:  See referral list for the physicians patient is currently seeing.  Dr. Lady Gary athletic injuries and ortho concerns  Dr. Marcello Fennel  colonoscopy-2014. Results were normal.   Review of Systems: See above   Objective:     General appearance: Appears stated age and thin Head: Normocephalic, without obvious abnormality, atraumatic  Eyes: conj clear, EOMi PEERLA  Ears: normal TM's and external ear canals both ears  Nose: Nares normal. Septum midline. Mucosa normal. No drainage or sinus tenderness.  Throat: lips, mucosa, and tongue normal; teeth and gums normal  Neck: no adenopathy, no carotid bruit, no JVD, supple, symmetrical, trachea midline and thyroid not enlarged, symmetric, no tenderness/mass/nodules  No CVA tenderness.  Lungs: clear to auscultation bilaterally  Breasts: normal appearance, no masses or tenderness Heart: regular rate and rhythm, S1, S2 normal, no murmur, click, rub or gallop  Abdomen: soft, non-tender; bowel sounds normal; no masses, no organomegaly  Musculoskeletal: ROM normal in all joints, no crepitus, no deformity, Normal muscle strengthen. Back  is symmetric, no curvature. Skin: Skin color, texture, turgor normal. No rashes or lesions  Lymph nodes: Cervical, supraclavicular, and axillary nodes normal.  Neurologic: CN 2 -12 Normal, Normal symmetric reflexes.  Normal coordination and gait  Psych: Alert & Oriented x 3, Mood appear stable.    Assessment:    Annual wellness medicare exam   Plan:    During the course of the visit the patient was educated and counseled about appropriate screening and preventive services including:   Annual mammogram   Colonoscopy done 2014 by Dr. Amedeo Plenty    Patient Instructions (the written plan) was given to the patient.  Medicare Attestation  I have personally reviewed:  The patient's medical and social history  Their use of alcohol, tobacco or illicit drugs  Their current medications and supplements  The patient's functional ability including ADLs,fall risks, home safety risks, cognitive, and hearing and visual impairment  Diet and physical  activities  Evidence for depression or mood disorders  The patient's weight, height, BMI, and visual acuity have been recorded in the chart. I have made referrals, counseling, and provided education to the patient based on review of the above and I have provided the patient with a written personalized care plan for preventive services.

## 2015-04-06 ENCOUNTER — Other Ambulatory Visit: Payer: Self-pay

## 2015-04-06 MED ORDER — METOCLOPRAMIDE HCL 10 MG PO TABS: 10.0000 mg | ORAL_TABLET | Freq: Four times a day (QID) | ORAL | Status: DC | PRN

## 2015-04-12 ENCOUNTER — Telehealth: Payer: Self-pay | Admitting: Internal Medicine

## 2015-04-12 DIAGNOSIS — G4762 Sleep related leg cramps: Secondary | ICD-10-CM

## 2015-04-12 LAB — MAGNESIUM: MAGNESIUM: 2 mg/dL (ref 1.5–2.5)

## 2015-04-12 LAB — IRON AND TIBC
%SAT: 28 % (ref 11–50)
IRON: 78 ug/dL (ref 45–160)
TIBC: 277 ug/dL (ref 250–450)
UIBC: 199 ug/dL (ref 125–400)

## 2015-04-12 NOTE — Telephone Encounter (Signed)
Calling stating that she's having severe leg cramps at night.  States that she drinks about 2 glasses of water before bedtime yet still has these terrible leg cramps during the night.  She has started 550 of Potassium OTC but continues to have them even as of last night.  She is "screaming out in the night with pain" from these cramps.  She will be leaving today at 1pm and be out of town for the next 3 days attending parties, etc.  She would like to know if you have suggestions for her of what she can do to aide in these leg cramps.    I did provide patient with potassium rich foods that she could add to her diet such as avocado, spinach, sweet potatoe, white beans, yogurt, acorn squash, banana, dried apricots, etc.  She will try to incorporate these into her diet.  She also wanted to know if there was possibly something that could be phoned in for her.    Her last Potassium Lab was 4.2 on 03/28/15.  Please advise.    Pharmacy:  [facility] @ [place].

## 2015-04-12 NOTE — Telephone Encounter (Signed)
Needs magnesium and Fe/TIBC checked. May need other prescription medication. Recommend Magnesium 400 mg daily if not taking.

## 2015-04-12 NOTE — Telephone Encounter (Signed)
Patient notified; she is on her way for labs

## 2015-04-15 ENCOUNTER — Telehealth: Payer: Self-pay

## 2015-04-15 NOTE — Telephone Encounter (Signed)
Patient notified

## 2015-04-15 NOTE — Telephone Encounter (Signed)
Patient was given the recommendations about the supplements and she states that she is not sure she mentioned it but the cramps/leg pain are happening at night after she drinks an alcoholic beverage. She states that it used to be after 3-4 drinks now it after 1. Please advise

## 2015-04-15 NOTE — Telephone Encounter (Signed)
I still think we can try the supplements as suggested or benadryl as outlined in previous note. The alternative is gabapentin 600 mg hs which is two 300 mg capsules before bedtime

## 2015-05-10 ENCOUNTER — Other Ambulatory Visit: Payer: Self-pay | Admitting: Internal Medicine

## 2015-06-07 ENCOUNTER — Ambulatory Visit (INDEPENDENT_AMBULATORY_CARE_PROVIDER_SITE_OTHER): Payer: Medicare Other | Admitting: Internal Medicine

## 2015-06-07 ENCOUNTER — Encounter: Payer: Self-pay | Admitting: Internal Medicine

## 2015-06-07 VITALS — BP 118/60 | HR 53 | Temp 98.6°F | Wt 122.0 lb

## 2015-06-07 DIAGNOSIS — K219 Gastro-esophageal reflux disease without esophagitis: Secondary | ICD-10-CM | POA: Diagnosis not present

## 2015-06-07 NOTE — Progress Notes (Signed)
   Subjective:    Patient ID: [name], female    DOB: 12-20-1940, 75 y.o.   MRN: [id number]  HPI Patient has long-standing history of GE reflux and has been on omeprazole for a long time. Has had previous dilatation of the esophagus by Dr. Teena Irani. A week or so ago was having more issues with GE reflux despite taking Prilosec 20 mg twice daily. Sometimes would have his earliest 5 PM. Has cut back on alcohol consumption. Has stopped taking Tums. She is on doxycycline but says she cannot do without that prescription. Reviewed with her foods it might increase GE reflux. She is currently not taking in [place]. Does take aspirin.  Also new complaint today is flatus. Sometimes has diarrhea. Usually constipated. Takes MiraLAX. May want to try over-the-counter simethicone and watch food consumption for foods that increase flatus production    Review of Systems     Objective:   Physical Exam  Not examined. Spent 20 minutes speaking with her about these issues. An H. pylori breath test was performed today.      Assessment & Plan:  GE reflux  Plan: Stop omeprazole. Trial of Nexium 24 twice daily. Samples provided. See if Nexium provides better relief than omeprazole. H. pylori breath test results pending. May try Phazyme 95 for gas.

## 2015-06-07 NOTE — Patient Instructions (Signed)
Try Phazyme 95 for gas. H. pylori breath test pending. Switch from omeprazole to Nexium.

## 2015-06-08 LAB — H. PYLORI BREATH TEST: H. PYLORI BREATH TEST: NOT DETECTED

## 2015-06-10 MED ORDER — ESOMEPRAZOLE MAGNESIUM 40 MG PO CPDR: 40.0000 mg | DELAYED_RELEASE_CAPSULE | Freq: Every day | ORAL | Status: DC

## 2015-06-10 NOTE — Addendum Note (Signed)
Addended by: Beryle Quant on: 06/10/2015 10:39 AM   Modules accepted: Orders

## 2015-06-22 ENCOUNTER — Ambulatory Visit (INDEPENDENT_AMBULATORY_CARE_PROVIDER_SITE_OTHER): Payer: Medicare Other | Admitting: Sports Medicine

## 2015-06-22 ENCOUNTER — Encounter: Payer: Self-pay | Admitting: Sports Medicine

## 2015-06-22 VITALS — BP 127/74 | HR 55 | Ht 64.0 in | Wt 120.0 lb

## 2015-06-22 DIAGNOSIS — M25572 Pain in left ankle and joints of left foot: Secondary | ICD-10-CM | POA: Diagnosis not present

## 2015-06-22 DIAGNOSIS — M23204 Derangement of unspecified medial meniscus due to old tear or injury, left knee: Secondary | ICD-10-CM | POA: Diagnosis not present

## 2015-06-22 DIAGNOSIS — M05739 Rheumatoid arthritis with rheumatoid factor of unspecified wrist without organ or systems involvement: Secondary | ICD-10-CM

## 2015-06-22 DIAGNOSIS — M25561 Pain in right knee: Secondary | ICD-10-CM | POA: Diagnosis not present

## 2015-06-22 DIAGNOSIS — M069 Rheumatoid arthritis, unspecified: Secondary | ICD-10-CM | POA: Insufficient documentation

## 2015-06-22 DIAGNOSIS — G8929 Other chronic pain: Secondary | ICD-10-CM

## 2015-06-22 NOTE — Assessment & Plan Note (Signed)
This has really improved with HEP with trainer No sxs now

## 2015-06-22 NOTE — Assessment & Plan Note (Signed)
DJD with loose bodies confirmed  Still use compression  Limit walking to 3 d per week

## 2015-06-22 NOTE — Patient Instructions (Signed)
Consider stationary bike/ get [name] a new cadillac Make sure it is comfortable I bought a great nordic track bike at [place] and a precision fitness one at [employer] makes a good one  You need to work the [employer] a little more on RT Tom can help design No arthritis or tear in meniscus So you can take the flexion a little deeper  Left ankle limits walking If you can go and build to 3x per week that is good Don't push pain past 3/10 or get a big increase in swelling

## 2015-06-22 NOTE — Progress Notes (Signed)
Patient ID: [name], female   DOB: 1940/10/28, 75 y.o.   MRN: [id number]  CC: RT knee pain/ chronic left ankle pain  Patient is diagnosed with RA - sees dr Amil Amen Now on remicaid infusion Prior was on methotrexate Hand and foot involvement  Left knee pain responded to compression and exercise Now RT knee pain No buckling or giving out Feels it on steps Compression did not help as much  Still doing regular exercise Regular 20 min walks - 2 x week  Past Rosacea - doxycyline 50 qd Hx of left knee deg meniscus Hx of left ankle fracture/ DJD  ROS Blepharitis Dryness No generalized joint swelling (on remicaid) No numbness  PEXAM Thin w F NAD O x 3 BP 127/74 mmHg  Pulse 55  Ht 5' 4"  (1.626 m)  Wt 120 lb (54.432 kg)  BMI 20.59 kg/m2  RT knee Knee: Normal to inspection with no erythema or effusion or obvious bony abnormalities. Palpation normal with no warmth or joint line tenderness or patellar tenderness or condyle tenderness. ROM normal in flexion and extension and lower leg rotation. Ligaments with solid consistent endpoints including ACL, PCL, LCL, MCL. Negative Mcmurray's and provocative meniscal tests. Non painful patellar compression. Patellar and quadriceps tendons unremarkable. Hamstring and quadriceps strength is normal.  She has excellent ROM - full She has mild pain on 1 leg thessaly and squat  Left Ankle  Mild chronic swelling more over sinus tarsi Limited movement  US exam RT knee NO SPP effusion Medial and lateral meniscus are intact w no deg splits PT/QT norm  LT ankle Spurring over lateral Mall Mild effusion Irregularity across talar dome with one hypoechoic cystic area Loose fragments beneath med malleolus

## 2015-06-22 NOTE — Assessment & Plan Note (Signed)
We will use an exercise program to focus on inc quad strength and ability of knee to flex with no pain

## 2015-08-10 ENCOUNTER — Telehealth: Payer: Self-pay | Admitting: Internal Medicine

## 2015-08-10 MED ORDER — METOCLOPRAMIDE HCL 10 MG PO TABS: 10.0000 mg | ORAL_TABLET | Freq: Three times a day (TID) | ORAL | Status: DC

## 2015-08-10 MED ORDER — ESOMEPRAZOLE MAGNESIUM 40 MG PO CPDR: 40.0000 mg | DELAYED_RELEASE_CAPSULE | Freq: Two times a day (BID) | ORAL | Status: DC

## 2015-08-10 NOTE — Telephone Encounter (Signed)
Refilled both reglan and nexium with new SIG; Fri. May 12th @ 10:00am to see Dr. Lenard Simmer- [phone]- Patient has been notified.

## 2015-08-10 NOTE — Telephone Encounter (Signed)
Still having indigestion pretty bad.  Taking the Nexium 50m.  Taking Reglan 149mabout twice a day regularly (although prescribed qid).  Taking TUMS like crazy.  Still having VERY bad heartburn.  Would like to discuss this on the phone since she was just here in the office.  She's leaving for the weekend tomorrow.  Please advise.    Pharmacy:  [facility] @ [address]

## 2015-08-10 NOTE — Telephone Encounter (Signed)
Patient will try Reglan one half hour before meals. Will continue to take Nexium but will increase to twice daily. We'll need to get appointment with Dr. Nigel Mormon whom she is seen before. We will call and get that appointment for her.

## 2015-08-22 ENCOUNTER — Ambulatory Visit
Admission: RE | Admit: 2015-08-22 | Discharge: 2015-08-22 | Disposition: A | Discharge status: Home / Self Care | Payer: Medicare Other | Source: Ambulatory Visit | Attending: Sports Medicine | Admitting: Sports Medicine

## 2015-08-22 DIAGNOSIS — M25561 Pain in right knee: Secondary | ICD-10-CM

## 2015-08-23 ENCOUNTER — Encounter: Payer: Self-pay | Admitting: Sports Medicine

## 2015-08-23 ENCOUNTER — Ambulatory Visit (INDEPENDENT_AMBULATORY_CARE_PROVIDER_SITE_OTHER): Payer: Medicare Other | Admitting: Sports Medicine

## 2015-08-23 VITALS — BP 120/70 | Ht 65.0 in | Wt 118.0 lb

## 2015-08-23 DIAGNOSIS — M25561 Pain in right knee: Secondary | ICD-10-CM | POA: Diagnosis not present

## 2015-08-23 NOTE — Assessment & Plan Note (Signed)
This seems more like PFPS  Work on hip abduction  Compression does not help this and will stop  Reck 2 mos

## 2015-08-23 NOTE — Progress Notes (Signed)
Patient ID: [name], female   DOB: 1940/12/26, 75 y.o.   MRN: [id number]  CC: RT knee pain  Right knee feels well today, but had bad flare up last week. Unsure what activity caused it, but now has pain only with stairs, both up and down. No swelling or discoloration, no catching, locking or giving out. Has done light yard work this week without pain. Has been able to exercise, including squats with trainer, without pain this week. Compression sleeve doesn't help and is uncomfortable.   Still doing regular exercise Regular 20 min walks - 2 x week  ROS No acute complaints today, otherwise feels well.   History Rosacea - doxycyline 50 qd Hx of left knee deg meniscus Hx of left ankle fracture/ DJD Patient is diagnosed with RA - sees dr Amil Amen Now on remicaid infusion Prior was on methotrexate Hand and foot involvement  PE: BP 120/70 mmHg  Ht 5' 5"  (4.742 m)  Wt 118 lb (53.524 kg)  BMI 19.64 kg/m2  WDWN, appears younger than age. NAD, pleasant and cooperative.  Nonlabored breathing, peripheral circulation grossly intact.  RT knee: Normal to inspection with no erythema or effusion or obvious bony abnormalities. Palpation normal with no warmth or joint line tenderness or patellar tenderness or condyle tenderness. ROM normal in full flexion and extension without pain.  Ligaments with solid consistent endpoints including ACL, PCL, LCL, MCL. Negative Mcmurray's and provocative meniscal tests. Non painful patellar compression. Patellar and quadriceps tendons unremarkable. Hamstring and quadriceps strength is normal. Lateral position hip abductors on right are weak compared to left Mild right patellar crepitus.   She has excellent ROM - full  Imaging:  RIGHT KNEE - COMPLETE 4+ VIEW (22 August 2015)   FINDINGS: Mild moderate medial joint space narrowing and associated spur formation. Mild lateral and patellofemoral spur formation.  Small effusion.  IMPRESSION: Mild-to-moderate degenerative changes and small effusion.  A/P 1. Right knee pain. Mild to moderate DJD in right knee as above, but not likely primary cause of her symptoms given her excellent strength and range of motion at the knee. Some degeneration of posterior patella with crepitus on exam. However, obvious weakness in right hip abduction, likely contributing to knee instability and pain with stairs. Given recommended step and crossover step exercises to do with trainer for hip strengthening. Return in 6 weeks to follow up.

## 2015-11-19 ENCOUNTER — Other Ambulatory Visit: Payer: Self-pay | Admitting: Internal Medicine

## 2015-11-19 DIAGNOSIS — M25572 Pain in left ankle and joints of left foot: Secondary | ICD-10-CM

## 2015-11-23 ENCOUNTER — Ambulatory Visit
Admission: RE | Admit: 2015-11-23 | Discharge: 2015-11-23 | Disposition: A | Discharge status: Home / Self Care | Payer: Medicare Other | Source: Ambulatory Visit | Attending: Internal Medicine | Admitting: Internal Medicine

## 2015-11-23 DIAGNOSIS — M25572 Pain in left ankle and joints of left foot: Secondary | ICD-10-CM

## 2015-12-08 ENCOUNTER — Ambulatory Visit (INDEPENDENT_AMBULATORY_CARE_PROVIDER_SITE_OTHER): Payer: Medicare Other | Admitting: Sports Medicine

## 2015-12-08 ENCOUNTER — Encounter: Payer: Self-pay | Admitting: Sports Medicine

## 2015-12-08 DIAGNOSIS — M19172 Post-traumatic osteoarthritis, left ankle and foot: Secondary | ICD-10-CM

## 2015-12-08 MED ORDER — NITROGLYCERIN 0.2 MG/HR TD PT24: MEDICATED_PATCH | TRANSDERMAL | 1 refills | Status: DC

## 2015-12-08 NOTE — Assessment & Plan Note (Signed)
She does have arthritis and some damage to the peroneal tendons she still is active and not severely limited  I suggested we try the nitroglycerin protocol Motion exercises to stimulate eversion of the ankle  She should limit walking to 3 times a week probably about 20 minutes  Continue to work with personal trainer on some strengthening exercises that do not cause pain  Compression  Treat this conservatively unless symptoms worsen significantly

## 2015-12-08 NOTE — Progress Notes (Signed)
CC; Arthritis pain in left ankle  Patient has both rheumatoid and osteoarthritis Her rheumatologist ordered an MRI of her left ankle which swells and causes pain The MRI shows an old fracture fragment as well as some splitting of the peroneal tendon and some arthritic change Orthopedic consult was suggested that she did not want to consider surgery  She comes for my opinion as to what she can do and how she can treat this symptomatically  She regularly works with a Physiological scientist  Past history Right wrist surgery Degenerative medial meniscus and Achilles problems Chronic conditions are relatively stable  Review of systems Swelling toward the end of the day more in the left ankle Pain with walking up or down steps in the left ankle Compression of the ankle lessens pain  Physical examination Thin pleasant female in no acute distress BP 103/83   Pulse (!) 55   Ht 5' 3"  (1.6 m)   Wt 115 lb (52.2 kg)   BMI 20.37 kg/m   Left ankle Ligaments stable Puffiness and swelling behind the lateral malleolus Degenerative change of the lateral ankle at the malleolus and the lateral tibia Mild pain with range of motion on plantar flexion that is felt in the posterior ankle Limited motion of inversion Eversion and dorsiflexion are good She can walk without a limp

## 2015-12-08 NOTE — Patient Instructions (Signed)
Ok to walk 20 min a day about 3 times a week  Perform eversion exercises with your foot, (turning out)  Use compression periodically for pain  Nitroglycerin Protocol   Apply 1/4 nitroglycerin patch to affected area daily.  Change position of patch within the affected area every 24 hours.  You may experience a headache during the first 1-2 weeks of using the patch, these should subside.  If you experience headaches after beginning nitroglycerin patch treatment, you may take your preferred over the counter pain reliever.  Another side effect of the nitroglycerin patch is skin irritation or rash related to patch adhesive.  Please notify our office if you develop more severe headaches or rash, and stop the patch.  Tendon healing with nitroglycerin patch may require 12 to 24 weeks depending on the extent of injury.  Men should not use if taking Viagra, Cialis, or Levitra.   Do not use if you have migraines or rosacea.

## 2015-12-19 ENCOUNTER — Other Ambulatory Visit: Payer: Self-pay | Admitting: Internal Medicine

## 2016-01-21 ENCOUNTER — Telehealth: Payer: Self-pay | Admitting: Internal Medicine

## 2016-01-21 ENCOUNTER — Encounter: Payer: Self-pay | Admitting: Internal Medicine

## 2016-01-21 MED ORDER — AZITHROMYCIN 250 MG PO TABS: ORAL_TABLET | ORAL | 0 refills | Status: DC

## 2016-01-21 NOTE — Telephone Encounter (Signed)
She and her husband  have come down with respiratory infection symptoms. No documented fever. She has a deep congested cough. No shaking chills. She had on hand a Z-Pak that had been prescribed for her husband which she started herself on. Symptoms been present for 2-3 days. We will call her in a Z-Pak. She's going to put her husband on that and finish out the Z-Pak that have been prescribed for him. She has Best boy and will take Mucinex. She had plan to come get a flu shot on Monday, September 25 but will cancel.

## 2016-01-23 ENCOUNTER — Ambulatory Visit: Payer: Medicare Other | Admitting: Internal Medicine

## 2016-01-30 ENCOUNTER — Other Ambulatory Visit: Payer: Self-pay | Admitting: *Deleted

## 2016-01-30 ENCOUNTER — Telehealth: Payer: Self-pay | Admitting: Internal Medicine

## 2016-01-30 MED ORDER — AZITHROMYCIN 250 MG PO TABS: ORAL_TABLET | ORAL | 0 refills | Status: DC

## 2016-01-30 NOTE — Telephone Encounter (Signed)
Patient would like to know if you will call in another Z-pac for her.  States she has residual cough/congestion.  Still not feeling 100%.     Pharmacy:  [facility] @ [address] and [address].

## 2016-01-30 NOTE — Telephone Encounter (Signed)
Done

## 2016-01-30 NOTE — Telephone Encounter (Signed)
Call in Zithromax Z-pak refill

## 2016-02-06 ENCOUNTER — Telehealth: Payer: Self-pay | Admitting: Internal Medicine

## 2016-02-06 NOTE — Telephone Encounter (Signed)
Patient would like a Z-pack antibiotic called in for when she might need it.

## 2016-02-06 NOTE — Telephone Encounter (Signed)
Rx mailed to have on hand

## 2016-02-09 ENCOUNTER — Ambulatory Visit (INDEPENDENT_AMBULATORY_CARE_PROVIDER_SITE_OTHER): Payer: Medicare Other | Admitting: Internal Medicine

## 2016-02-09 DIAGNOSIS — Z23 Encounter for immunization: Secondary | ICD-10-CM

## 2016-02-10 DIAGNOSIS — Z23 Encounter for immunization: Secondary | ICD-10-CM

## 2016-02-15 ENCOUNTER — Encounter: Payer: Self-pay | Admitting: Internal Medicine

## 2016-02-15 NOTE — Progress Notes (Signed)
Flu vaccine given.

## 2016-02-15 NOTE — Patient Instructions (Signed)
Flu vaccine given.

## 2016-02-17 ENCOUNTER — Encounter: Payer: Self-pay | Admitting: Internal Medicine

## 2016-02-17 ENCOUNTER — Ambulatory Visit (INDEPENDENT_AMBULATORY_CARE_PROVIDER_SITE_OTHER): Payer: Medicare Other | Admitting: Internal Medicine

## 2016-02-17 VITALS — BP 122/62 | HR 60 | Wt 124.0 lb

## 2016-02-17 DIAGNOSIS — J209 Acute bronchitis, unspecified: Secondary | ICD-10-CM

## 2016-02-17 DIAGNOSIS — J3081 Allergic rhinitis due to animal (cat) (dog) hair and dander: Secondary | ICD-10-CM | POA: Diagnosis not present

## 2016-02-17 MED ORDER — PREDNISONE 10 MG PO TABS: 10.0000 mg | ORAL_TABLET | Freq: Every day | ORAL | 1 refills | Status: DC

## 2016-02-17 NOTE — Progress Notes (Signed)
   Subjective:    Patient ID: [name], female    DOB: 1940-09-17, 75 y.o.   MRN: [id number]  HPI   75 year old Female  With productive cough for several weeks despite 2 rounds of Zithromax Z pak. Has cat in house. Notices itchy eyes after handling cat. Husband had similar illness but improved with Z-pak. No fever.No chills. No wheezing.    Review of Systems as above     Objective:   Physical Exam   Skin warm and dry. Nodes None. Chest clearTo auscultation without rales. TMs are clear. Pharynx is clear.        Assessment & Plan:  Acute bronchitis  Probable allergic rhinitis-allergy to cats  Plan: Sterapred DS 10 mg 6 day dose pack. Levaquin 250 mg for 7 days. Sample of Symbicort 160/4.51 spray by mouth every 12 hours. May need allergy testing if symptoms persist.

## 2016-02-17 NOTE — Patient Instructions (Signed)
Levaquin 250 mg daily for 7 days. Sterapred DS 10 mg six-day Dosepak. Symbicort inhaler 1 spray by mouth every 12 hours.

## 2016-02-20 ENCOUNTER — Other Ambulatory Visit: Payer: Self-pay | Admitting: *Deleted

## 2016-02-20 MED ORDER — LEVOFLOXACIN 250 MG PO TABS: 250.0000 mg | ORAL_TABLET | Freq: Every day | ORAL | 0 refills | Status: DC

## 2016-02-24 ENCOUNTER — Encounter: Payer: Self-pay | Admitting: Family Medicine

## 2016-02-24 ENCOUNTER — Ambulatory Visit (INDEPENDENT_AMBULATORY_CARE_PROVIDER_SITE_OTHER): Payer: Medicare Other | Admitting: Family Medicine

## 2016-02-24 DIAGNOSIS — M25551 Pain in right hip: Secondary | ICD-10-CM | POA: Diagnosis not present

## 2016-02-24 NOTE — Progress Notes (Signed)
  [name] - 76 y.o. female MRN [id number]  Date of birth: Jun 17, 1940    SUBJECTIVE:     Chief Complaint: RIGHT hip pain  HPI: Started on Sunday of this week  (5 days ago) when she climbed a three step ladder four or five times. Had no pain at the time. No fall or twisting injury. The next morning she awoke and had pain with walking. It has continues,maybe getting a little better. She decided to  Pull out her old hip exercises (from left hip injury years ago0 and did them but it seemed to make it worse. Has a personal trainer that comes to her house and last night he convinced her she needed evaluation.  Pain is with certain movements, walking and stair climbing. . Pain is not ptresentat rest unless she totally extends her leg; even then, once placed in position she can get comfortable ROS:     Has had no redness or swelling of the right hip. No fever, sweats or chills. No urinary or fecal incontinence. No foot or leg numbess.  PERTINENT  PMH / PSH FH / / SH:  Past Medical, Surgical, Social, and Family History Reviewed & Updated in the EMR.  Pertinent findings include:  Rheumatoid arthritis on Remicade (2 years tx); also hx of OA at ankle and other sites Left knee meniscal tear GERD  DEXXA 2012 at spine only with "normal" bone density; some scoliosis and djd noted DEXXA 2014 T score -1.1 Never smoker  OBJECTIVE: BP 132/78   Pulse (!) 54   Physical Exam:  Vital signs are reviewed. GENERAL: WD WN thin female, NAD HIPS: IR/ER of both hips is full ROM and painless. Hip flexion is full (very limber) ROM and normal strength. FABER Bilaterally painless. FADIR on left is normal, mild pain on RIGHT. No tenderness to palpation in buttock or thigh or over greater trochanter area. Axial loading (bump test) negative for pain. Difficult to reproduce her pain. Weight bearing however is painful as is step up onto stool, getting on and off table.  [place] hip: very small amount of fluid seen in thehip  joint but otherwise normal [place].  ASSESSMENT & PLAN:  1. RIGHT hip pain: With piositive FADIR she could have labral irritation / small tear. No specifi injury so I doubbt it is significant tear. I think she has aggravated it by trying to do the exercises. I recommend conservative rest (no sit ups, no leg raises, no LE exercise at all---this is going to be hard for her). Ice and Heat as needed. She just finished  (yesterday) a steroid burst for some lung issue so I will defer a trail of low doe prednisone. Given age I will not add NSAIDS a tthis time. IF she has no improvement with conservative therapy or  worsening pain, will get films and re-evaluate. Would recommend f/u 2 weeks.

## 2016-02-24 NOTE — Patient Instructions (Signed)
You have a small amount of fluid in the hipjoint--probably in response to the 4th or 5th time you climbed the ladder. For the next 2 weeks, rest the hip. You can walk as you need to but no extra walking. No specific hip exercises---in fact REST it. Avoid any deep knee bends and avoid stairs as much as possible, If it is notimiproving in 2 weeks, please call and let us know and I will order some x rays and see you back in clinic.

## 2016-03-01 ENCOUNTER — Telehealth: Payer: Self-pay | Admitting: *Deleted

## 2016-03-01 MED ORDER — TRAMADOL HCL 50 MG PO TABS: 50.0000 mg | ORAL_TABLET | Freq: Four times a day (QID) | ORAL | 0 refills | Status: DC | PRN

## 2016-03-01 NOTE — Telephone Encounter (Signed)
Sent Tramadol in to the requested pharmacy

## 2016-03-08 ENCOUNTER — Telehealth: Payer: Self-pay | Admitting: *Deleted

## 2016-03-08 DIAGNOSIS — M25551 Pain in right hip: Secondary | ICD-10-CM

## 2016-03-08 NOTE — Telephone Encounter (Signed)
Per Dr Oneida Alar, we need to get right hip images and after those have be obtain she will need to schedule an appointment to see him

## 2016-03-09 ENCOUNTER — Other Ambulatory Visit: Payer: Self-pay | Admitting: Sports Medicine

## 2016-03-09 ENCOUNTER — Ambulatory Visit
Admission: RE | Admit: 2016-03-09 | Discharge: 2016-03-09 | Disposition: A | Discharge status: Home / Self Care | Payer: Medicare Other | Source: Ambulatory Visit | Attending: Sports Medicine | Admitting: Sports Medicine

## 2016-03-09 DIAGNOSIS — M25551 Pain in right hip: Secondary | ICD-10-CM

## 2016-03-13 ENCOUNTER — Encounter: Payer: Self-pay | Admitting: Sports Medicine

## 2016-03-13 ENCOUNTER — Ambulatory Visit (INDEPENDENT_AMBULATORY_CARE_PROVIDER_SITE_OTHER): Payer: Medicare Other | Admitting: Sports Medicine

## 2016-03-13 DIAGNOSIS — M25551 Pain in right hip: Secondary | ICD-10-CM

## 2016-03-13 NOTE — Progress Notes (Signed)
RT Hip pain  Developed RT hip pain after climbing on ladder Saw DR Nori Riis who started her on HEP and conservative care Was improving but tried some exercises I gave her in past Had flare of increased hip pain  Because of pain around lateral and anterior portion of RT hip we obtained XR  Pain has lessened somewhat Still aches Pain if she sleeps on that side  Past HX RA treated by Dr Amil Amen on Remicaid  Soc: lives with husband/ non smoker  Ros No sciatic sxs No pain with normal walking Hurts on steps  PEXAM Pleasant older lady in NAD BP 125/67   Ht 5' 4"  (1.626 m)   Full ROM of RT and left hips Hip flexion on RT is strong Hip abduction is strong but causes pain No TTP over greater trochanter Mild pain over Glut medius to deep palpation  XR - reviewed by me and no sign of Hip OA

## 2016-03-13 NOTE — Patient Instructions (Signed)
Xrays were very good No Arthritis!  What you have done is related to going up ladder or steps This is a strain of gluteus medius muscle That muscle helps you balance and stand  Have Tom work with you Start easy - only 5 or 6 repetitions Only 2 sets  In standing position Try hip circles Hip lateral and medial swings Hip flexion  Continue doing any exercises that do not hurt But avoid squats and lunges until this feels better  Expect this to improve a lot over 4 to 6 weeks

## 2016-03-13 NOTE — Assessment & Plan Note (Signed)
Probable strain of Left Glut. Med.  Given a a home program  She will work with her personal trainer  If more problems reck

## 2016-05-09 ENCOUNTER — Encounter: Payer: Self-pay | Admitting: Internal Medicine

## 2016-05-21 ENCOUNTER — Other Ambulatory Visit: Payer: Self-pay

## 2016-05-21 MED ORDER — ESOMEPRAZOLE MAGNESIUM 40 MG PO CPDR: DELAYED_RELEASE_CAPSULE | ORAL | 11 refills | Status: DC

## 2016-05-22 ENCOUNTER — Other Ambulatory Visit: Payer: Self-pay

## 2016-06-18 ENCOUNTER — Ambulatory Visit (INDEPENDENT_AMBULATORY_CARE_PROVIDER_SITE_OTHER): Payer: Medicare Other | Admitting: Internal Medicine

## 2016-06-18 ENCOUNTER — Encounter: Payer: Self-pay | Admitting: Internal Medicine

## 2016-06-18 VITALS — BP 120/80 | HR 60 | Temp 98.1°F | Wt 123.5 lb

## 2016-06-18 DIAGNOSIS — K580 Irritable bowel syndrome with diarrhea: Secondary | ICD-10-CM

## 2016-06-18 DIAGNOSIS — R197 Diarrhea, unspecified: Secondary | ICD-10-CM

## 2016-06-18 LAB — COMPREHENSIVE METABOLIC PANEL
ALK PHOS: 44 U/L (ref 33–130)
ALT: 21 U/L (ref 6–29)
AST: 20 U/L (ref 10–35)
Albumin: 4 g/dL (ref 3.6–5.1)
BILIRUBIN TOTAL: 0.5 mg/dL (ref 0.2–1.2)
BUN: 11 mg/dL (ref 7–25)
CALCIUM: 9.3 mg/dL (ref 8.6–10.4)
CO2: 30 mmol/L (ref 20–31)
Chloride: 107 mmol/L (ref 98–110)
Creat: 0.62 mg/dL (ref 0.60–0.93)
GLUCOSE: 100 mg/dL — AB (ref 65–99)
POTASSIUM: 4.2 mmol/L (ref 3.5–5.3)
Sodium: 143 mmol/L (ref 135–146)
TOTAL PROTEIN: 6.3 g/dL (ref 6.1–8.1)

## 2016-06-18 LAB — CBC WITH DIFFERENTIAL/PLATELET
BASOS ABS: 55 {cells}/uL (ref 0–200)
Basophils Relative: 1 %
EOS PCT: 5 %
Eosinophils Absolute: 275 cells/uL (ref 15–500)
HCT: 40.3 % (ref 35.0–45.0)
Hemoglobin: 13.3 g/dL (ref 11.7–15.5)
LYMPHS ABS: 2255 {cells}/uL (ref 850–3900)
Lymphocytes Relative: 41 %
MCH: 32.9 pg (ref 27.0–33.0)
MCHC: 33 g/dL (ref 32.0–36.0)
MCV: 99.8 fL (ref 80.0–100.0)
MONOS PCT: 8 %
MPV: 8.8 fL (ref 7.5–12.5)
Monocytes Absolute: 440 cells/uL (ref 200–950)
NEUTROS ABS: 2475 {cells}/uL (ref 1500–7800)
NEUTROS PCT: 45 %
PLATELETS: 247 10*3/uL (ref 140–400)
RBC: 4.04 MIL/uL (ref 3.80–5.10)
RDW: 13.9 % (ref 11.0–15.0)
WBC: 5.5 10*3/uL (ref 3.8–10.8)

## 2016-06-18 MED ORDER — METRONIDAZOLE 500 MG PO TABS: 500.0000 mg | ORAL_TABLET | Freq: Two times a day (BID) | ORAL | 0 refills | Status: DC

## 2016-06-18 MED ORDER — CIPROFLOXACIN HCL 500 MG PO TABS: 500.0000 mg | ORAL_TABLET | Freq: Two times a day (BID) | ORAL | 0 refills | Status: DC

## 2016-06-18 MED ORDER — DICYCLOMINE HCL 20 MG PO TABS: 20.0000 mg | ORAL_TABLET | Freq: Three times a day (TID) | ORAL | 0 refills | Status: DC

## 2016-06-18 NOTE — Patient Instructions (Signed)
To submit stool studies for O&P, C. difficile, enteric pathogens and white blood cell. Start Bentyl 20 mg one half hour before meals. CBC with differential, C met and sedimentation rate drawn. If Bentyl does not help, refer to gastroenterologist.

## 2016-06-18 NOTE — Progress Notes (Signed)
   Subjective:    Patient ID: [name], female    DOB: 1941-04-01, 76 y.o.   MRN: [id number]  HPI   76 year old Female who has a several month history of frequent diarrhea. It has now become several times daily after she eats even a very small meal. Not sure what foods are triggering the diarrhea if any. There is no travel history to explain it. Has not consumed any well water. Has a lot of gas. Has been taking Imodium over the past 24 hours to stop the diarrhea. No blood in bowel movement.  Prior to this had issues with constipation.  History of GE reflux treated with PPI    Review of Systems see above     Objective:   Physical Exam Abdomen is soft nondistended without hepatosplenomegaly masses or tenderness.       Assessment & Plan:  Diarrhea-? Irritable bowel syndrome with diarrhea, possible bacterial overgrowth syndrome,? Colitis-inflammatory bowel disease  Plan: Patient will take Cipro 500 mg twice daily for 3 days, Flagyl 500 mg twice daily for 5 days. She will be over and parasites given been Teal 20 mg to take one half hour before meals. If this is not successful in treating the issues, she will be referred to gastroneurologist. In the meantime we will collect stool studies including stool for white blood cells, O and P, enteric pathogens, C. difficile toxin. CBC with differential, C met and sedimentation rate drawn.  25 minutes spent with patient

## 2016-06-19 ENCOUNTER — Other Ambulatory Visit: Payer: Self-pay | Admitting: Internal Medicine

## 2016-06-19 ENCOUNTER — Ambulatory Visit: Payer: Self-pay | Admitting: Internal Medicine

## 2016-06-19 DIAGNOSIS — R197 Diarrhea, unspecified: Secondary | ICD-10-CM

## 2016-06-19 LAB — SEDIMENTATION RATE: Sed Rate: 4 mm/hr (ref 0–30)

## 2016-06-22 ENCOUNTER — Ambulatory Visit: Payer: Self-pay | Admitting: Internal Medicine

## 2016-06-22 ENCOUNTER — Other Ambulatory Visit: Payer: Medicare Other | Admitting: Internal Medicine

## 2016-06-22 ENCOUNTER — Encounter: Payer: Self-pay | Admitting: Internal Medicine

## 2016-06-22 ENCOUNTER — Telehealth: Payer: Self-pay | Admitting: Internal Medicine

## 2016-06-22 DIAGNOSIS — R197 Diarrhea, unspecified: Secondary | ICD-10-CM

## 2016-06-22 NOTE — Telephone Encounter (Signed)
See orders for stool studies

## 2016-06-22 NOTE — Addendum Note (Signed)
Addended by: Inocencio Homes on: 06/22/2016 12:14 PM   Modules accepted: Orders

## 2016-06-23 LAB — CLOSTRIDIUM DIFFICILE BY PCR: CDIFFPCR: NOT DETECTED

## 2016-06-24 LAB — STOOL CELLS, WBC & RBC
RBC/40X FIELD:: 0
WBC/40X FIELD (SOL): 0

## 2016-06-25 LAB — GIARDIA ANTIGEN

## 2016-06-26 LAB — STOOL CULTURE

## 2016-07-03 ENCOUNTER — Other Ambulatory Visit: Payer: Self-pay

## 2016-07-03 ENCOUNTER — Telehealth: Payer: Self-pay

## 2016-07-03 DIAGNOSIS — Z1211 Encounter for screening for malignant neoplasm of colon: Secondary | ICD-10-CM

## 2016-07-03 NOTE — Telephone Encounter (Signed)
Patient called states that she has finish the round of antibiotics and she's not better everything's the same she's still having excessive diarrhea, she's still taking BENTYL before each meal and imodium.

## 2016-07-03 NOTE — Telephone Encounter (Signed)
Error

## 2016-07-03 NOTE — Telephone Encounter (Signed)
Please make GI referral. May need colonoscopy to see if she has colitis.

## 2016-07-03 NOTE — Telephone Encounter (Signed)
Done

## 2016-07-09 ENCOUNTER — Encounter: Payer: Self-pay | Admitting: Internal Medicine

## 2016-07-10 ENCOUNTER — Other Ambulatory Visit: Payer: Self-pay | Admitting: Internal Medicine

## 2016-07-10 NOTE — Telephone Encounter (Signed)
Is diarrhea still an issue? Is GI consult needed? Is this med helping?

## 2016-07-17 ENCOUNTER — Other Ambulatory Visit: Payer: Self-pay

## 2016-07-17 MED ORDER — DICYCLOMINE HCL 20 MG PO TABS: 20.0000 mg | ORAL_TABLET | Freq: Three times a day (TID) | ORAL | 1 refills | Status: DC

## 2016-07-17 NOTE — Telephone Encounter (Signed)
Pt called to get a refill on this medication. Please advise thanks.

## 2016-08-03 ENCOUNTER — Other Ambulatory Visit: Payer: Medicare Other | Admitting: Internal Medicine

## 2016-08-03 DIAGNOSIS — M8580 Other specified disorders of bone density and structure, unspecified site: Secondary | ICD-10-CM

## 2016-08-03 DIAGNOSIS — E785 Hyperlipidemia, unspecified: Secondary | ICD-10-CM

## 2016-08-03 DIAGNOSIS — Z1329 Encounter for screening for other suspected endocrine disorder: Secondary | ICD-10-CM

## 2016-08-03 DIAGNOSIS — Z0000 Encounter for general adult medical examination without abnormal findings: Secondary | ICD-10-CM

## 2016-08-03 LAB — LIPID PANEL
CHOLESTEROL: 247 mg/dL — AB (ref <200)
HDL: 109 mg/dL (ref >50)
LDL Cholesterol: 119 mg/dL — ABNORMAL HIGH (ref <100)
TRIGLYCERIDES: 93 mg/dL (ref <150)
Total CHOL/HDL Ratio: 2.3 Ratio (ref <5.0)
VLDL: 19 mg/dL (ref <30)

## 2016-08-03 LAB — CBC WITH DIFFERENTIAL/PLATELET
BASOS ABS: 0 {cells}/uL (ref 0–200)
Basophils Relative: 0 %
EOS PCT: 4 %
Eosinophils Absolute: 184 cells/uL (ref 15–500)
HCT: 40.2 % (ref 35.0–45.0)
HEMOGLOBIN: 13.6 g/dL (ref 11.7–15.5)
Lymphocytes Relative: 46 %
Lymphs Abs: 2116 cells/uL (ref 850–3900)
MCH: 32.8 pg (ref 27.0–33.0)
MCHC: 33.8 g/dL (ref 32.0–36.0)
MCV: 96.9 fL (ref 80.0–100.0)
MPV: 8.6 fL (ref 7.5–12.5)
Monocytes Absolute: 414 cells/uL (ref 200–950)
Monocytes Relative: 9 %
NEUTROS ABS: 1886 {cells}/uL (ref 1500–7800)
Neutrophils Relative %: 41 %
Platelets: 206 10*3/uL (ref 140–400)
RBC: 4.15 MIL/uL (ref 3.80–5.10)
RDW: 13.8 % (ref 11.0–15.0)
WBC: 4.6 10*3/uL (ref 3.8–10.8)

## 2016-08-03 LAB — COMPREHENSIVE METABOLIC PANEL
ALK PHOS: 45 U/L (ref 33–130)
ALT: 30 U/L — ABNORMAL HIGH (ref 6–29)
AST: 27 U/L (ref 10–35)
Albumin: 3.9 g/dL (ref 3.6–5.1)
BILIRUBIN TOTAL: 0.4 mg/dL (ref 0.2–1.2)
BUN: 12 mg/dL (ref 7–25)
CO2: 29 mmol/L (ref 20–31)
CREATININE: 0.59 mg/dL — AB (ref 0.60–0.93)
Calcium: 9.1 mg/dL (ref 8.6–10.4)
Chloride: 105 mmol/L (ref 98–110)
Glucose, Bld: 87 mg/dL (ref 65–99)
Potassium: 4.3 mmol/L (ref 3.5–5.3)
SODIUM: 140 mmol/L (ref 135–146)
TOTAL PROTEIN: 6.4 g/dL (ref 6.1–8.1)

## 2016-08-03 LAB — TSH: TSH: 2.21 mIU/L

## 2016-08-04 LAB — VITAMIN D 25 HYDROXY (VIT D DEFICIENCY, FRACTURES): Vit D, 25-Hydroxy: 50 ng/mL (ref 30–100)

## 2016-08-06 ENCOUNTER — Ambulatory Visit (INDEPENDENT_AMBULATORY_CARE_PROVIDER_SITE_OTHER): Payer: Medicare Other | Admitting: Internal Medicine

## 2016-08-06 VITALS — BP 120/64 | HR 56 | Temp 98.7°F | Ht 64.0 in | Wt 120.0 lb

## 2016-08-06 DIAGNOSIS — Z8739 Personal history of other diseases of the musculoskeletal system and connective tissue: Secondary | ICD-10-CM

## 2016-08-06 DIAGNOSIS — K219 Gastro-esophageal reflux disease without esophagitis: Secondary | ICD-10-CM | POA: Diagnosis not present

## 2016-08-06 DIAGNOSIS — Z0000 Encounter for general adult medical examination without abnormal findings: Secondary | ICD-10-CM

## 2016-08-06 DIAGNOSIS — R197 Diarrhea, unspecified: Secondary | ICD-10-CM

## 2016-08-06 DIAGNOSIS — M8580 Other specified disorders of bone density and structure, unspecified site: Secondary | ICD-10-CM | POA: Diagnosis not present

## 2016-08-06 DIAGNOSIS — R8290 Unspecified abnormal findings in urine: Secondary | ICD-10-CM

## 2016-08-06 LAB — POCT URINALYSIS DIPSTICK
Bilirubin, UA: NEGATIVE
Glucose, UA: NEGATIVE
KETONES UA: NEGATIVE
NITRITE UA: NEGATIVE
PH UA: 6 (ref 5.0–8.0)
PROTEIN UA: NEGATIVE
RBC UA: NEGATIVE
SPEC GRAV UA: 1.01 (ref 1.030–1.035)
Urobilinogen, UA: 0.2 (ref ?–2.0)

## 2016-08-06 NOTE — Progress Notes (Signed)
Subjective:    Patient ID: [name], female    DOB: June 18, 1940, 76 y.o.   MRN: [id number]  HPI 76 year old Female for health maintenance exam And evaluation of medical issues. She has a Physiological scientist and sees Dr. Andreas Blower for musculoskeletal issues.  Recently she has had considerable issues with diarrhea and gas and bloating. She is scheduled to have a colonoscopy in the near future to determine if she has inflammatory bowel disease. Stool studies were negative for enteric pathogens and parasites as well as C. difficile toxin. Prior to the onset of diarrhea a few months ago, she had issues with constipation and gas. Diarrhea has not responded to Bentyl.  She has a history of rheumatoid arthritis. Has been treated with Dr. Rondel Oh with Remicade She has a high HDL of 109. Recent LDL was 119 and total cholesterol 247.  Having some issues with dysphagia as well.  No history of hospitalizations other than childbirth. History of environmental allergies. History of mild osteopenia that is stable.  Immunizations are up-to-date.  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 In May 2015 she had surgery once again for rupture of extensor digitorum longus right ring finger. Since then she's done well.  Remote history of plantar fasciitis.  Has tried MiraLAX for constipation in the past.  Social history: She is married to Shelly Flatten, well-known Probation officer and [employer]. She has a Firefighter. One adult son who lives in [place] and is a Therapist, nutritional.  Social alcohol consumption. Does not smoke. Native of [place] ,[place]. Her graft family history: Father died at age 83 of complications of COPD. Mother died at age 44 of heart failure. No brothers or sisters.            Review of Systems  Respiratory:  Negative.   Gastrointestinal: Positive for diarrhea.       Gas and bloating  Musculoskeletal: Positive for arthralgias.  Neurological: Negative.   Hematological: Negative.   Psychiatric/Behavioral: Negative.        Objective:   Physical Exam  Constitutional: She is oriented to person, place, and time. She appears well-developed and well-nourished. No distress.  Cardiovascular: Normal rate, regular rhythm and normal heart sounds.   Pulmonary/Chest:  Breasts normal female  Genitourinary:  Genitourinary Comments: Pap deferred due to age  Musculoskeletal: She exhibits no edema.  Neurological: She is alert and oriented to person, place, and time.  Skin: She is not diaphoretic.  Vitals reviewed.         Assessment & Plan:  Diarrhea-inflammatory bowel disease versus irritable bowel syndrome. Have colonoscopy in the near future. Has not responded to Bentyl and stool studies have been negative  Mild dysphagia-treated for GE reflux  History of rheumatoid arthritis  Osteopenia-continue vitamin D supplement  Hyperlipidemia-continue diet and exercise. Total cholesterol 247 and was 224 year ago. LDL cholesterol was 117 and was 119 year ago. However her HDL is 109  Plan: Return in one year or as needed. To see gastroenterologist in the near future.  Subjective:   Patient presents for Medicare Annual/Subsequent preventive examination.  Review Past Medical/Family/Social: see above   Risk Factors  Current exercise habits: exercise with trainer Dietary issues discussed: low fat low carb  Cardiac risk factors:  Depression Screen  (Note: if answer to either of the  following is "Yes", a more complete depression screening is indicated)   Over the past two weeks, have you felt down, depressed or hopeless? No  Over the past two weeks, have you felt little interest or pleasure in doing things? No Have you lost interest or pleasure in daily life? No Do you often feel hopeless? No Do  you cry easily over simple problems? No   Activities of Daily Living  In your present state of health, do you have any difficulty performing the following activities?:   Driving? No  Managing money? No  Feeding yourself? No  Getting from bed to chair? No  Climbing a flight of stairs? No  Preparing food and eating?: No  Bathing or showering? No  Getting dressed: No  Getting to the toilet? No  Using the toilet:No  Moving around from place to place: No  In the past year have you fallen or had a near fall?yes Are you sexually active? yes Do you have more than one partner? No   Hearing Difficulties: No  Do you often ask people to speak up or repeat themselves? No  Do you experience ringing or noises in your ears? No  Do you have difficulty understanding soft or whispered voices? No  Do you feel that you have a problem with memory? No Do you often misplace items? No    Home Safety:  Do you have a smoke alarm at your residence? Yes Do you have grab bars in the bathroom?yes Do you have throw rugs in your house? yes   Cognitive Testing  Alert? Yes Normal Appearance?Yes  Oriented to person? Yes Place? Yes  Time? Yes  Recall of three objects? Yes  Can perform simple calculations? Yes  Displays appropriate judgment?Yes  Can read the correct time from a watch face?Yes   List the Names of Other Physician/Practitioners you currently use:  See referral list for the physicians patient is currently seeing.  Dr. Oneida Alar Gastroenterologust   Review of Systems: See above   Objective:     General appearance: Appears stated age and thin Head: Normocephalic, without obvious abnormality, atraumatic  Eyes: conj clear, EOMi PEERLA  Ears: normal TM's and external ear canals both ears  Nose: Nares normal. Septum midline. Mucosa normal. No drainage or sinus tenderness.  Throat: lips, mucosa, and tongue normal; teeth and gums normal  Neck: no adenopathy, no carotid bruit, no JVD, supple,  symmetrical, trachea midline and thyroid not enlarged, symmetric, no tenderness/mass/nodules  No CVA tenderness.  Lungs: clear to auscultation bilaterally  Breasts: normal appearance, no masses or tenderness Heart: regular rate and rhythm, S1, S2 normal, no murmur, click, rub or gallop  Abdomen: soft, non-tender; bowel sounds normal; no masses, no organomegaly  Musculoskeletal: ROM normal in all joints, no crepitus, no deformity, Normal muscle strengthen. Back  is symmetric, no curvature. Skin: Skin color, texture, turgor normal. No rashes or lesions  Lymph nodes: Cervical, supraclavicular, and axillary nodes normal.  Neurologic: CN 2 -12 Normal, Normal symmetric reflexes. Normal coordination and gait  Psych: Alert & Oriented x 3, Mood appear stable.    Assessment:    Annual wellness medicare exam   Plan:    During the course of the visit the patient was educated and counseled about appropriate screening and preventive services including:   Annual mammogram     Patient Instructions (the written plan) was given to the patient.  Medicare Attestation  I have personally reviewed:  The patient's medical and social history  Their  use of alcohol, tobacco or illicit drugs  Their current medications and supplements  The patient's functional ability including ADLs,fall risks, home safety risks, cognitive, and hearing and visual impairment  Diet and physical activities  Evidence for depression or mood disorders  The patient's weight, height, BMI, and visual acuity have been recorded in the chart. I have made referrals, counseling, and provided education to the patient based on review of the above and I have provided the patient with a written personalized care plan for preventive services.

## 2016-08-07 LAB — URINE CULTURE

## 2016-08-09 ENCOUNTER — Encounter: Payer: Self-pay | Admitting: Internal Medicine

## 2016-08-09 ENCOUNTER — Ambulatory Visit (INDEPENDENT_AMBULATORY_CARE_PROVIDER_SITE_OTHER): Payer: Medicare Other | Admitting: Internal Medicine

## 2016-08-09 VITALS — BP 114/60 | HR 60 | Ht 63.75 in | Wt 123.2 lb

## 2016-08-09 DIAGNOSIS — R1310 Dysphagia, unspecified: Secondary | ICD-10-CM

## 2016-08-09 DIAGNOSIS — K582 Mixed irritable bowel syndrome: Secondary | ICD-10-CM | POA: Diagnosis not present

## 2016-08-09 DIAGNOSIS — R197 Diarrhea, unspecified: Secondary | ICD-10-CM

## 2016-08-09 NOTE — Patient Instructions (Signed)
   It is possible you have what we call a high impaction in the colon.  On a day of your choosing - stop Imodium and dicyclomine.  Then take 2 dulcolax tablets, wait 1 hour and then drink 4 doses of MiraLax over 1- 2 hours.  If you do not have multiple bowel movements then take 2 more doses of MiraLax.  If this works you will not need the dicyclomine and Imodium.   Please call me back and let me know how things are after you do this.  I appreciate the opportunity to care for you. Gatha Mayer, MD, [facility]

## 2016-08-09 NOTE — Progress Notes (Signed)
[name] 76 y.o. June 27, 1940 [id number]  Assessment & Plan:   Encounter Diagnoses  Name Primary?  . Diarrhea, unspecified type Yes  . Irritable bowel syndrome with both constipation and diarrhea   . Pill dysphagia    I wonder if she might not have a high stool impaction problem and will try a MiraLAX purge to see if that helps. It is unhelpful I think a colonoscopy to look for microscopic colitis or other types of inflammatory bowel issues might be in order.  I reassured her about the pill dysphagia there is no food dysphagia so this may be some sort of pharyngoesophageal problem though it seems minor and further workup is very optional I think.   Opposite went to the visit she did try the MiraLAX purge but had persistent diarrhea afterwards. Thus a colonoscopy will be scheduled. She is asked about having an EGD to evaluate the dysphagia and again I've told her that is very optional, I think it be unlikely to reveal a problem but we will see if she wants to schedule that it's not unreasonable may reassure her which can be helpful.  I appreciate the opportunity to care for this patient. CC: [name], MD    Subjective:   Chief Complaint: Diarrhea  HPI The patient is a very nice 76 year old married white woman here at the request of Dr. Renold Genta because of diarrhea. In the past couple of months she started to have diarrhea, she said she's been constipated mostly her whole life. So this is very doing somewhat odd for her. He saw Dr. Renold Genta in February and was appropriately evaluated with stool studies, all of which were unrevealing including C. difficile, ova and parasite white cells, Giardia antigen and a stool culture. Whenever the diarrhea has persisted she treats this with loperamide. She gets successful relief with that but then when it stop she will unloading have multiple soft pudding-like stools. Nothing really watery she says. Dicyclomine was prescribed and might help  some She wonders if Ginger beer is causing a problem that she recently drank that and had some stools afterwards. Some of this is postprandial. She's had mild fecal incontinence in the beginning before she started treating with her my. Her colonoscopy 2014, Dr. Amedeo Plenty negative also had a negative EGD  She denies significant stress though is planning to move to [place] in 2 or 3 years but says she is looking forward to that. Therefore caffeinated beverages a day, alcoholic drinks 3 times a week. Patient does complain of some pill dysphagia. All other gastroenterology review of systems are negative except for some heartburn symptoms at times.  Wt Readings from Last 3 Encounters:  08/09/16 123 lb 4 oz (55.9 kg)  08/06/16 120 lb (54.4 kg)  06/18/16 123 lb 8 oz (56 kg)   Allergies  Allergen Reactions  . Celebrex [Celecoxib] Nausea And Vomiting    All cox 2 inbibators   Past Medical History:  Diagnosis Date  . Chronic constipation   . GERD (gastroesophageal reflux disease)   . Hyperlipidemia   . Rheumatoid arthritis ([place])   . Status post dilation of esophageal narrowing   . Wears glasses    Past Surgical History:  Procedure Laterality Date  . COLONOSCOPY    . DILATION AND CURETTAGE OF UTERUS  2009  . ESOPHAGEAL DILATION  05/31/1997  . REPAIR EXTENSOR TENDON Right 05/14/2013   Procedure: LONG FINGER EXTENSOR TO ELEVATE RING AND SMALL FINGERS ;  Surgeon: Youlanda Mighty Sypher  Brooke Bonito., MD;  Location: [facility];  Service: Orthopedics;  Laterality: Right;  . seborrheic keratosis  09/03/12   inflamed, excised from forehead  . TENDON TRANSFER Right 09/03/2013   Procedure: RIGHT HAND FLEXOR CARPI RADIALUS TRANSFER TO LONG/RING/SMALL;  Surgeon: Cammie Sickle., MD;  Location: [facility];  Service: Orthopedics;  Laterality: Right;  . TONSILLECTOMY    . TUBAL LIGATION    . ULNAR HEAD EXCISION Right 05/14/2013   Procedure: ARTHOPLASTY DISTAL RADIAL ULNAR JOINT, RIGHT ;   Surgeon: Cammie Sickle., MD;  Location: [facility];  Service: Orthopedics;  Laterality: Right;  . UPPER GASTROINTESTINAL ENDOSCOPY     Current Meds  Medication Sig  . acetaminophen (TYLENOL) 500 MG tablet Take 500 mg by mouth as needed.  . Biotin (BIOTIN 5000) 5 MG CAPS Take by mouth daily.  . calcium carbonate (TUMS EX) 750 MG chewable tablet Chew 1,500 mg by mouth.  . clobetasol (OLUX) 0.05 % topical foam APPLY TO SCALP ONCE DAILY  . Clobetasol Propionate 0.05 % shampoo APPLY ON THE SKIN AND SCALP D  . cyanocobalamin (V-R VITAMIN B-12) 500 MCG tablet Take 500 mcg by mouth.  . dicyclomine (BENTYL) 20 MG tablet Take 1 tablet (20 mg total) by mouth 3 (three) times daily before meals.  Marland Kitchen doxycycline (VIBRAMYCIN) 50 MG capsule TAKE 1 CAPSULE BY MOUTH DAILY, THEN DECREASE TO ONE CAPSULE EVERY OTHER DAY AFTER A COUPLE OF WEEKS  . esomeprazole (NEXIUM) 40 MG capsule TAKE 1 CAPSULE(40 MG) BY MOUTH TWICE DAILY BEFORE A MEAL  . hydrocortisone cream 1 % Apply 1 application topically as needed for itching.  . Ketoprofen 10 % CREA Apply topically as needed.  . Loperamide HCl (IMODIUM PO) Take by mouth as needed.  . moxifloxacin (VIGAMOX) 0.5 % ophthalmic solution PLACE 1 DROP IN RIGHT EYE 4X A DAY STARTING 2 DAYS PRIOR TO SURGERY &2 DROPS MORNING OF SURGERY  . Multiple Vitamins-Minerals (CENTRUM SILVER PO) Take 1 tablet by mouth daily.    . Multiple Vitamins-Minerals (EYE VITAMINS) CAPS Take by mouth.  . prednisoLONE acetate (PRED FORTE) 1 % ophthalmic suspension INSTILL 1 DROP INTO RIGHT EYE 4 TIMES A DAY  . PROLENSA 0.07 % SOLN INSTILL 1 DROP INTO RIGHT EYE EVERY DAY , BEGIN AFTER SURGERY  . pyridOXINE (VITAMIN B-6) 100 MG tablet Take 100 mg by mouth daily.  . RESTASIS 0.05 % ophthalmic emulsion   . SOOLANTRA 1 % CREA APPLY ON THE AFFECTED AREA OF SKIN AS DIRECTED.  Marland Kitchen VITAMIN D, CHOLECALCIFEROL, PO Take 5,000 Units by mouth 3 (three) times a week.  . vitamin E 400 UNIT capsule  Take 400 Units by mouth daily.   Social History   Social History  . Marital status: Married    Spouse name: [name]  . Number of children: 1  . Years of education: 81   Occupational History  . RetiredBiomedical scientist Retired   Social History Main Topics  . Smoking status: Never Smoker  . Smokeless tobacco: Never Used  . Alcohol use 10.5 oz/week    21 Standard drinks or equivalent per week     Comment: 3 times weekly  . Drug use: No  . Sexual activity: Not Asked   Other Topics Concern  . None   Social History Narrative   Health Care POA: Husband, Fred   Emergency Contact: Wilfrid Lund of Life Plan:    Who lives with you: husband   Son, in  [place]   Any pets: 3 cats   Diet: Pt has a varied diet of protein, starch, and vegetables.   Exercise: Pt works with trainer 2x a week for 60 minutes   Seatbelts: Pt reports wearing seatbelt when in vehicle.   Sun Exposure/Protection: Pt reports not using sun protection.   Hobbies: gardening, reading, concerts               family history includes COPD in her father; Colon cancer in her paternal uncle; Heart disease in her mother; Osteoporosis in her maternal aunt, maternal grandmother, and mother.  Review of Systems As per history of present illness. She has joint pains arthritis, very mild allergies, she is dealing with some cataracts. All other review of systems are negative.  Objective:   Physical Exam @BP  114/60 (BP Location: Left Arm, Patient Position: Sitting, Cuff Size: Normal)   Pulse 60   Ht 5' 3.75" (1.619 m) Comment: height measured without shoes  Wt 123 lb 4 oz (55.9 kg)   BMI 21.32 kg/m @  General:  Well-developed, well-nourished and in no acute distress Eyes:  anicteric. ENT:   Mouth and posterior pharynx free of lesions.  Neck:   supple w/o thyromegaly or mass.  Lungs: Clear to auscultation bilaterally. Heart:  S1S2, no rubs, murmurs, gallops. Abdomen:  soft, non-tender, no hepatosplenomegaly, hernia, or mass and  BS+.  Rectal: Patti Martin[place], [place] present.  NL anodrm NL tone NL squeeze NL descent Firm frmed ball of stool No rectocele Lymph:  no cervical or supraclavicular adenopathy. Extremities:   no edema, cyanosis or clubbing Skin   no rash. Neuro:  A&O x 3.  Psych:  appropriate mood and  Affect.   Data Reviewed:

## 2016-08-13 ENCOUNTER — Telehealth: Payer: Self-pay | Admitting: Internal Medicine

## 2016-08-13 NOTE — Telephone Encounter (Signed)
Patient reports that she has done the Miralax purge per instructions today.  She also wanted you to know that she took 5 imodium on Saturday.  She had no results until a few minutes ago after 6 doses of Miralax. She had a very small solid BM. Please advise next step

## 2016-08-13 NOTE — Telephone Encounter (Signed)
Let's see what happens I am assuming she is not in distress Let me know if so  I would like another update tomorrow She may yet move her bowels more today/tonight

## 2016-08-13 NOTE — Telephone Encounter (Signed)
Patient calling back regarding this.

## 2016-08-13 NOTE — Telephone Encounter (Signed)
Patient notified She will call tomorrow with an update

## 2016-08-14 ENCOUNTER — Encounter: Payer: Self-pay | Admitting: Internal Medicine

## 2016-08-14 MED ORDER — DIPHENOXYLATE-ATROPINE 2.5-0.025 MG PO TABS: 1.0000 | ORAL_TABLET | Freq: Four times a day (QID) | ORAL | 0 refills | Status: DC | PRN

## 2016-08-14 NOTE — Addendum Note (Signed)
Addended by: Marlon Pel on: 08/14/2016 04:14 PM   Modules accepted: Orders

## 2016-08-14 NOTE — Telephone Encounter (Signed)
She reports that she had 5 or 6 BM yesterday evening the stools were liquid, but not clear "Kinda puddle like at the end".  No pain or cramps with it.  Today she held her dicyclomine in the am. After lunch she had diarrhea again.  She took 4 immodium after lunch.  Please advise next steps.

## 2016-08-14 NOTE — Telephone Encounter (Signed)
OK I think I will need to evaluate with a colonoscopy then Dx: diarrhea   Can Rx Lomotil 1 qid prn # 60 no refill instead of the loperamide if desired

## 2016-08-14 NOTE — Telephone Encounter (Signed)
We can do an EGD if she wants but I would tell her that pill dysphagia without food dysphagia unlikely to show any problems - that is usually a problem with posterior pharynx where it meets esophagus

## 2016-08-14 NOTE — Telephone Encounter (Signed)
Patient notified of the recommendations. She says she is also having some dysphagia to pills and asks if she can have an EGD at the same time.

## 2016-08-15 NOTE — Progress Notes (Signed)
Thanks this is helpful.

## 2016-08-15 NOTE — Telephone Encounter (Signed)
Patient will come pick up prescription.  She does not want to pursue EGD at this time.

## 2016-08-20 ENCOUNTER — Other Ambulatory Visit: Payer: Self-pay | Admitting: Internal Medicine

## 2016-08-20 ENCOUNTER — Telehealth: Payer: Self-pay

## 2016-08-20 NOTE — Telephone Encounter (Signed)
Please decline the refill by calling the pharmacy.

## 2016-08-20 NOTE — Telephone Encounter (Signed)
She wants you to know that her colonoscopy has been scheduled on 10/02/16. She doesn't need DICYCLOMINE refilled right now.

## 2016-08-21 NOTE — Telephone Encounter (Signed)
OK to refill x 1 w/ 1 RF

## 2016-08-21 NOTE — Telephone Encounter (Signed)
Prescription faxed to patient's pharmacy. 

## 2016-08-21 NOTE — Telephone Encounter (Signed)
Dr. Carlean Purl, can we refill?

## 2016-08-23 ENCOUNTER — Other Ambulatory Visit: Payer: Self-pay | Admitting: Internal Medicine

## 2016-08-24 ENCOUNTER — Other Ambulatory Visit: Payer: Self-pay

## 2016-08-24 ENCOUNTER — Telehealth: Payer: Self-pay | Admitting: Internal Medicine

## 2016-08-24 MED ORDER — COLESTIPOL HCL 5 G PO PACK: 5.0000 g | PACK | Freq: Two times a day (BID) | ORAL | 0 refills | Status: DC

## 2016-08-24 NOTE — Telephone Encounter (Signed)
Sorry to hear still bothering her  I recommend colestipol 5 g bid rx packets # 60

## 2016-08-24 NOTE — Telephone Encounter (Signed)
Patient reports that she is having continued diarrhea. She had 5 stools yesterday.  She says that the first stool is usually solid then as she has more they become "puddle like.  She has had 3 BM before noon today.  She is taking dicyclomine 20 mg TID ac meals and 5-6 lomotil a day. Her symptoms are getting worse. She is traveling this weekend and wants to know what else she can do.

## 2016-08-24 NOTE — Telephone Encounter (Signed)
Patient advised to start the colestipol 5 gm BID, Rx sent to pharmacy.

## 2016-08-26 NOTE — Patient Instructions (Signed)
It was a pleasure to see you today. See gastroneurologist regarding diarrhea issues. Continue working out with Physiological scientist and work on lipids. Return in one year or as needed.

## 2016-08-27 ENCOUNTER — Other Ambulatory Visit: Payer: Self-pay | Admitting: Internal Medicine

## 2016-08-27 NOTE — Telephone Encounter (Signed)
Refill Sir? Thank you for your time.

## 2016-08-28 NOTE — Telephone Encounter (Signed)
OK 

## 2016-09-05 ENCOUNTER — Ambulatory Visit (AMBULATORY_SURGERY_CENTER): Payer: Self-pay

## 2016-09-05 VITALS — Ht 64.0 in | Wt 122.2 lb

## 2016-09-05 DIAGNOSIS — R197 Diarrhea, unspecified: Secondary | ICD-10-CM

## 2016-09-05 DIAGNOSIS — Z800 Family history of malignant neoplasm of digestive organs: Secondary | ICD-10-CM

## 2016-09-05 NOTE — Progress Notes (Signed)
Per pt, no allergies to soy or egg products.Pt not taking any weight loss meds or using  O2 at home.  Pt refused Emmi video.

## 2016-09-06 ENCOUNTER — Encounter: Payer: Self-pay | Admitting: Internal Medicine

## 2016-09-10 ENCOUNTER — Telehealth: Payer: Self-pay | Admitting: Internal Medicine

## 2016-09-10 MED ORDER — COLESTIPOL HCL 5 G PO PACK: 5.0000 g | PACK | Freq: Two times a day (BID) | ORAL | 0 refills | Status: DC

## 2016-09-10 NOTE — Telephone Encounter (Signed)
Patient never picked up colestipol.  She thought it was a prescribing error. Colestipol sent to the pharmacy.  She will try this.

## 2016-09-12 ENCOUNTER — Other Ambulatory Visit: Payer: Self-pay

## 2016-09-12 MED ORDER — CHOLESTYRAMINE LIGHT 4 G PO PACK: 4.0000 g | PACK | Freq: Two times a day (BID) | ORAL | 1 refills | Status: DC

## 2016-09-12 NOTE — Telephone Encounter (Signed)
Pharmacy sent [place] a notice that the colestipol granules are backordered so Dr Carlean Purl said to send in cholestyramine light 4 grams bid.

## 2016-09-18 ENCOUNTER — Other Ambulatory Visit: Payer: Self-pay | Admitting: Internal Medicine

## 2016-09-20 ENCOUNTER — Ambulatory Visit (AMBULATORY_SURGERY_CENTER): Payer: Medicare Other | Admitting: Internal Medicine

## 2016-09-20 ENCOUNTER — Telehealth: Payer: Self-pay | Admitting: Internal Medicine

## 2016-09-20 ENCOUNTER — Encounter: Payer: Self-pay | Admitting: Internal Medicine

## 2016-09-20 VITALS — BP 96/65 | HR 56 | Temp 98.6°F | Resp 12 | Ht 64.0 in | Wt 122.0 lb

## 2016-09-20 DIAGNOSIS — R197 Diarrhea, unspecified: Secondary | ICD-10-CM

## 2016-09-20 DIAGNOSIS — Z800 Family history of malignant neoplasm of digestive organs: Secondary | ICD-10-CM

## 2016-09-20 DIAGNOSIS — K529 Noninfective gastroenteritis and colitis, unspecified: Secondary | ICD-10-CM | POA: Diagnosis not present

## 2016-09-20 MED ORDER — SODIUM CHLORIDE 0.9 % IV SOLN: 500.0000 mL | INTRAVENOUS | Status: DC

## 2016-09-20 NOTE — Telephone Encounter (Signed)
Patient states she has been up all night using the bathroom due to prep. States she cannot stay out of the bathroom this morning. Cannot take second part of prep and get to the office for her procedure. Discussed with Dr. Carlean Purl and he states she does not have to drink second half of prep. Called patient and informed and she is grateful, will arrive at 130 pm for 230 pm appointment.

## 2016-09-20 NOTE — Op Note (Signed)
[place] Patient Name: [name] Procedure Date: 09/20/2016 2:43 PM MRN: [id number] Endoscopist: Gatha Mayer , MD Age: 76 Referring MD:  Date of Birth: 10-27-1940 Gender: Female Account #: [id number] Procedure:                Colonoscopy Indications:              Chronic diarrhea, Clinically significant diarrhea                            of unexplained origin Medicines:                Propofol per Anesthesia, Monitored Anesthesia Care Procedure:                Pre-Anesthesia Assessment:                           - Prior to the procedure, a History and Physical                            was performed, and patient medications and                            allergies were reviewed. The patient's tolerance of                            previous anesthesia was also reviewed. The risks                            and benefits of the procedure and the sedation                            options and risks were discussed with the patient.                            All questions were answered, and informed consent                            was obtained. Prior Anticoagulants: The patient has                            taken no previous anticoagulant or antiplatelet                            agents. ASA Grade Assessment: II - A patient with                            mild systemic disease. After reviewing the risks                            and benefits, the patient was deemed in                            satisfactory condition to undergo the procedure.  After obtaining informed consent, the colonoscope                            was passed under direct vision. Throughout the                            procedure, the patient's blood pressure, pulse, and                            oxygen saturations were monitored continuously. The                            Model PCF-H190DL [id number]) scope was introduced                            through the  anus and advanced to the the cecum,                            identified by appendiceal orifice and ileocecal                            valve. The colonoscopy was performed without                            difficulty. The patient tolerated the procedure                            well. The quality of the bowel preparation was                            good. The ileocecal valve, appendiceal orifice, and                            rectum were photographed. The bowel preparation                            used was Miralax. Scope In: 2:54:39 PM Scope Out: 3:08:32 PM Scope Withdrawal Time: 0 hours 10 minutes 7 seconds  Total Procedure Duration: 0 hours 13 minutes 53 seconds  Findings:                 The perianal and digital rectal examinations were                            normal.                           Multiple small and large-mouthed diverticula were                            found in the sigmoid colon.                           The exam was otherwise without abnormality on  direct and retroflexion views.                           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 Estimated                            blood loss was minimal. Complications:            No immediate complications. Estimated Blood Loss:     Estimated blood loss was minimal. Impression:               - Diverticulosis in the sigmoid colon.                           - The examination was otherwise normal on direct                            and retroflexion views.                           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 Recommendation:           - Patient has a contact number available for                             emergencies. The signs and symptoms of potential                            delayed complications were discussed with the                            patient. Return to normal activities tomorrow.                            Written discharge instructions were provided to the                            patient.                           - Continue present medications.                           - Resume previous diet.                           -  Await pathology results.                           - No repeat colonoscopy due to age. Gatha Mayer, MD 09/20/2016 3:19:54 PM This report has been signed electronically.

## 2016-09-20 NOTE — Patient Instructions (Addendum)
The colon looks ok - diverticulosis but no inflammation.  I took biopsies to look for microscopic inflammation - colitis. I should know something and contact you next week.  I appreciate the opportunity to care for you. Gatha Mayer, MD, FACG    YOU HAD AN ENDOSCOPIC PROCEDURE TODAY AT [facility]:   Refer to the procedure report that was given to you for any specific questions about what was found during the examination.  If the procedure report does not answer your questions, please call your gastroenterologist to clarify.  If you requested that your care partner not be given the details of your procedure findings, then the procedure report has been included in a sealed envelope for you to review at your convenience later.  YOU SHOULD EXPECT: Some feelings of bloating in the abdomen. Passage of more gas than usual.  Walking can help get rid of the air that was put into your GI tract during the procedure and reduce the bloating. If you had a lower endoscopy (such as a colonoscopy or flexible sigmoidoscopy) you may notice spotting of blood in your stool or on the toilet paper. If you underwent a bowel prep for your procedure, you may not have a normal bowel movement for a few days.  Please Note:  You might notice some irritation and congestion in your nose or some drainage.  This is from the oxygen used during your procedure.  There is no need for concern and it should clear up in a day or so.  SYMPTOMS TO REPORT IMMEDIATELY:   Following lower endoscopy (colonoscopy or flexible sigmoidoscopy):  Excessive amounts of blood in the stool  Significant tenderness or worsening of abdominal pains  Swelling of the abdomen that is new, acute  Fever of 100F or higher    For urgent or emergent issues, a gastroenterologist can be reached at any hour by calling [phone].   DIET:  We do recommend a small meal at first, but then you may proceed to your regular diet.   Drink plenty of fluids but you should avoid alcoholic beverages for 24 hours.  ACTIVITY:  You should plan to take it easy for the rest of today and you should NOT DRIVE or use heavy machinery until tomorrow (because of the sedation medicines used during the test).    FOLLOW UP: Our staff will call the number listed on your records the next business day following your procedure to check on you and address any questions or concerns that you may have regarding the information given to you following your procedure. If we do not reach you, we will leave a message.  However, if you are feeling well and you are not experiencing any problems, there is no need to return our call.  We will assume that you have returned to your regular daily activities without incident.  If any biopsies were taken you will be contacted by phone or by letter within the next 1-3 weeks.  Please call us at [phone] if you have not heard about the biopsies in 3 weeks.    SIGNATURES/CONFIDENTIALITY: You and/or your care partner have signed paperwork which will be entered into your electronic medical record.  These signatures attest to the fact that that the information above on your After Visit Summary has been reviewed and is understood.  Full responsibility of the confidentiality of this discharge information lies with you and/or your care-partner.   Resume medications. Information given on diverticulosis.

## 2016-09-20 NOTE — Progress Notes (Signed)
To recovery, report to Owens Shark, Therapist, sports, VSS

## 2016-09-20 NOTE — Progress Notes (Signed)
Pt's friend, Ms. Carmie Kanner, will be her driver.  Will call her at [phone].  Pt's husband is her care partner. maw

## 2016-09-20 NOTE — Progress Notes (Signed)
Called to room to assist during endoscopic procedure.  Patient ID and intended procedure confirmed with present staff. Received instructions for my participation in the procedure from the performing physician.  

## 2016-09-21 ENCOUNTER — Telehealth: Payer: Self-pay | Admitting: *Deleted

## 2016-09-21 NOTE — Telephone Encounter (Signed)

## 2016-09-28 ENCOUNTER — Other Ambulatory Visit: Payer: Self-pay | Admitting: Internal Medicine

## 2016-09-28 ENCOUNTER — Other Ambulatory Visit: Payer: Self-pay

## 2016-09-28 ENCOUNTER — Encounter: Payer: Self-pay | Admitting: Internal Medicine

## 2016-09-28 DIAGNOSIS — K52832 Lymphocytic colitis: Secondary | ICD-10-CM

## 2016-09-28 HISTORY — DX: Lymphocytic colitis: K52.832

## 2016-09-28 MED ORDER — BUDESONIDE 3 MG PO CPEP: 3.0000 mg | ORAL_CAPSULE | Freq: Every day | ORAL | 2 refills | Status: DC

## 2016-09-28 NOTE — Progress Notes (Signed)
Please call patient - I am ccing her PCP also now  She has a microscopic colitis causing the diarrhea - at the time of colonoscopy she was better but I am not sure how much we need to treat with prednisone vs budesonide unless she is off Lomotil and Questran and no sxs (let me know if so) Budesonide 3 mg take 3 daily # 60 1 refill - advantage less side effects possible - disadvantage costly sometimes - we can try this and if too expensive then - see below  If budesonide too expensive prednisone 5 mg tabs take 4 a day x 7 days 3 a day x 14 days and then 2 each day x 14 days then 1 each day until f/u # 100 1 refill  See me in 6-8 weeks

## 2016-10-02 ENCOUNTER — Encounter: Payer: Medicare Other | Admitting: Internal Medicine

## 2016-10-18 ENCOUNTER — Other Ambulatory Visit (INDEPENDENT_AMBULATORY_CARE_PROVIDER_SITE_OTHER): Payer: Medicare Other

## 2016-10-18 ENCOUNTER — Telehealth: Payer: Self-pay | Admitting: Internal Medicine

## 2016-10-18 DIAGNOSIS — R252 Cramp and spasm: Secondary | ICD-10-CM

## 2016-10-18 LAB — BASIC METABOLIC PANEL
BUN: 16 mg/dL (ref 6–23)
CALCIUM: 9.5 mg/dL (ref 8.4–10.5)
CO2: 30 mEq/L (ref 19–32)
Chloride: 104 mEq/L (ref 96–112)
Creatinine, Ser: 0.81 mg/dL (ref 0.40–1.20)
GFR: 72.73 mL/min (ref >60.00)
Glucose, Bld: 116 mg/dL — ABNORMAL HIGH (ref 70–99)
POTASSIUM: 3.9 meq/L (ref 3.5–5.1)
SODIUM: 141 meq/L (ref 135–145)

## 2016-10-18 LAB — MAGNESIUM: Magnesium: 1.9 mg/dL (ref 1.5–2.5)

## 2016-10-18 NOTE — Telephone Encounter (Signed)
Patient denies diarrhea  She will come for labs in the few days.

## 2016-10-18 NOTE — Telephone Encounter (Signed)
Not common that I am aware  1) How much diarrhea still? 2) Please check BMET and MG level re leg cramps

## 2016-10-18 NOTE — Progress Notes (Signed)
Labs are ok Was looking to see if K or Mg low but not.  Is she still taking Cholestyramine?

## 2016-10-18 NOTE — Telephone Encounter (Signed)
Dr. Carlean Purl are you aware of leg cramps being a side effect of entocort?

## 2016-10-19 NOTE — Progress Notes (Signed)
If she is normal to constipated stop it - no weaning necessary

## 2016-10-22 ENCOUNTER — Telehealth: Payer: Self-pay

## 2016-10-22 NOTE — Telephone Encounter (Signed)
If she has tried Magnesium 400 mg daily for leg cramps without success, she may need OV to discuss prescription medication.

## 2016-10-22 NOTE — Telephone Encounter (Signed)
She hasn't tried the magnesium, she said she would like to work on that and she will call back if that doesn't work for her.

## 2016-10-22 NOTE — Telephone Encounter (Signed)
Patient called states her leg cramps are back she said you recommended vitamin supplements but she wants to know if there's anything else she can do. She said she recently had blood work from Dr. Carlean Purl. She said she has to drink a lot of water for her cramps to go away.

## 2016-11-13 ENCOUNTER — Telehealth: Payer: Self-pay | Admitting: Internal Medicine

## 2016-11-13 NOTE — Telephone Encounter (Signed)
She called not too long ago about this and we recommended Magnsium 400 mg daily. If this is NOT helping after 4 weeks, we can try gabapentin but need OV to discuss

## 2016-11-13 NOTE — Telephone Encounter (Signed)
Appt made

## 2016-11-13 NOTE — Telephone Encounter (Signed)
Patient calls stating that she's having leg cramps for the past 2-3 years but especially worse over the last 6 months since she has started taking the vitamins that you suggested:  Vitamin B, B12, E, Magnesium.  She is drinking lots of water (which is the ONLY thing which seems to relieve the cramps).  The cramps are only at night.  She is having about 6 oz of wine at some point during the day.    She wants to know if you want her to continue the vitamins?    Best # for contact:  [phone]

## 2016-11-15 ENCOUNTER — Encounter (INDEPENDENT_AMBULATORY_CARE_PROVIDER_SITE_OTHER): Payer: Self-pay

## 2016-11-15 ENCOUNTER — Ambulatory Visit (INDEPENDENT_AMBULATORY_CARE_PROVIDER_SITE_OTHER): Payer: Medicare Other | Admitting: Internal Medicine

## 2016-11-15 ENCOUNTER — Encounter: Payer: Self-pay | Admitting: Internal Medicine

## 2016-11-15 VITALS — BP 96/58 | HR 56 | Ht 63.75 in | Wt 120.0 lb

## 2016-11-15 DIAGNOSIS — R252 Cramp and spasm: Secondary | ICD-10-CM

## 2016-11-15 DIAGNOSIS — K52832 Lymphocytic colitis: Secondary | ICD-10-CM

## 2016-11-15 MED ORDER — DIPHENOXYLATE-ATROPINE 2.5-0.025 MG PO TABS: ORAL_TABLET | ORAL | 0 refills | Status: DC

## 2016-11-15 MED ORDER — BUDESONIDE 3 MG PO CPEP: 9.0000 mg | ORAL_CAPSULE | Freq: Every day | ORAL | 1 refills | Status: DC

## 2016-11-15 NOTE — Patient Instructions (Addendum)
  We are giving you rx's today for Lomotil and Entocort to take to the pharmacy when you need them.  Use up the supply you have first.    We will see you back on 01/14/17 at 1:30PM.     I appreciate the opportunity to care for you. Silvano Rusk, MD, [facility]

## 2016-11-15 NOTE — Progress Notes (Signed)
[name] 76 y.o. 1941-02-04 [id number]  Assessment & Plan:   Encounter Diagnoses  Name Primary?  . Lymphocytic colitis w/ patchy collagen deposition   . Leg cramps Yes     Change budesonide to 9 mg daily. Stay on that until she sees me in September. Try to make Lomotil as needed medication only She never filled Prevalite so will take that off her medication list and I would not add that now anyway. Dicyclomine can be placed on an as needed list I explained the nature of microscopic colitis and how most of the time it some sort of abnormally triggered immune reaction, perhaps related to medications though we never know for sure. It is interesting that she is on Remicade for rheumatoid arthritis and still has this problem. It is possible she has more of an irritable bowel problem then she has with colitis itself.  I appreciate the opportunity to care for this patient. CC: [name], MD   Subjective:   Chief Complaint:Diarrhea follow-up of colitis  HPI Patient returns for follow-up. She is better on budesonide but is having intermittent diarrhea. She is taking 3 mg budesonide daily, I incorrectly prescribed a lower than normal dose. She is having intermittent leg cramps that are helped by drinking large volumes of water. I think she is trying magnesium as recommended by Dr. Renold Genta and she will see Dr. Renold Genta next week about this problem. She is also taking Lomotil several times a day to prevent diarrhea. Overall much better. Allergies  Allergen Reactions  . Celebrex [Celecoxib] Nausea And Vomiting    All cox 2 inbibators/ severe abd pain   Current Meds  Medication Sig  . acetaminophen (TYLENOL) 500 MG tablet Take 500 mg by mouth as needed.  . Biotin (BIOTIN 5000) 5 MG CAPS Take by mouth daily.  . calcium carbonate (TUMS EX) 750 MG chewable tablet Chew 1,500 mg by mouth.  . clobetasol (OLUX) 0.05 % topical foam APPLY TO SCALP ONCE DAILY  . Clobetasol Propionate  0.05 % shampoo APPLY ON THE SKIN AND SCALP D  . cyanocobalamin (V-R VITAMIN B-12) 500 MCG tablet Take 500 mcg by mouth.  . dicyclomine (BENTYL) 20 MG tablet Take 1 tablet (20 mg total) by mouth 3 (three) times daily before meals.  . diphenoxylate-atropine (LOMOTIL) 2.5-0.025 MG tablet TAKE 1 TABLET BY MOUTH FOUR TIMES A DAY AS NEEDED FOR DIARRHEA OR LOOSE STOOLS  . doxycycline (VIBRAMYCIN) 50 MG capsule TAKE 1 CAPSULE BY MOUTH DAILY, THEN DECREASE TO ONE CAPSULE EVERY OTHER DAY AFTER A COUPLE OF WEEKS  . esomeprazole (NEXIUM) 40 MG capsule TAKE 1 CAPSULE(40 MG) BY MOUTH TWICE DAILY BEFORE A MEAL  . hydrocortisone cream 1 % Apply 1 application topically as needed for itching.  . InFLIXimab (REMICADE IV) Inject into the vein. Remicade Infusion every 4 months  . Ketoprofen 10 % CREA Apply topically as needed.  . Loperamide HCl (IMODIUM PO) Take by mouth as needed.  . magnesium oxide (MAG-OX) 400 MG tablet Take 400 mg by mouth daily.  . Multiple Vitamins-Minerals (CENTRUM SILVER PO) Take 1 tablet by mouth daily.    . Multiple Vitamins-Minerals (EYE VITAMINS) CAPS Take by mouth.  . SOOLANTRA 1 % CREA APPLY ON THE AFFECTED AREA OF SKIN AS DIRECTED.  Marland Kitchen VITAMIN D, CHOLECALCIFEROL, PO Take 5,000 Units by mouth 3 (three) times a week.  . vitamin E 400 UNIT capsule Take 400 Units by mouth daily.  . [DISCONTINUED] budesonide (ENTOCORT EC) 3 MG  24 hr capsule Take 1 capsule (3 mg total) by mouth daily.  . [DISCONTINUED] cholestyramine light (PREVALITE) 4 g packet Take 1 packet (4 g total) by mouth 2 (two) times daily.  . [DISCONTINUED] diphenoxylate-atropine (LOMOTIL) 2.5-0.025 MG tablet TAKE 1 TABLET BY MOUTH FOUR TIMES A DAY AS NEEDED FOR DIARRHEA OR LOOSE STOOLS   Past Medical History:  Diagnosis Date  . Allergy    seasonal  . Anemia   . Cataract    bil cateracts removed  . Chronic constipation   . Diarrhea    chronic diarrheafor over 2 months  . GERD (gastroesophageal reflux disease)   .  Hyperlipidemia   . 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 Rheumatoid arthritis ([place])   . Status post dilation of esophageal narrowing 2014  . Wears glasses    Past Surgical History:  Procedure Laterality Date  . CATARACT EXTRACTION  06/30/2016 -right eye   left eye in 08/15/16  . COLONOSCOPY    . DILATION AND CURETTAGE OF UTERUS  2009  . ESOPHAGEAL DILATION  05/31/1997  . REPAIR EXTENSOR TENDON Right 05/14/2013   Procedure: LONG FINGER EXTENSOR TO ELEVATE RING AND SMALL FINGERS ;  Surgeon: Cammie Sickle., MD;  Location: [facility];  Service: Orthopedics;  Laterality: Right;  . seborrheic keratosis  09/03/12   inflamed, excised from forehead  . TENDON TRANSFER Right 09/03/2013   Procedure: RIGHT HAND FLEXOR CARPI RADIALUS TRANSFER TO LONG/RING/SMALL;  Surgeon: Cammie Sickle., MD;  Location: [place];  Service: Orthopedics;  Laterality: Right;  . TONSILLECTOMY    . TUBAL LIGATION    . ULNAR HEAD EXCISION Right 05/14/2013   Procedure: ARTHOPLASTY DISTAL RADIAL ULNAR JOINT, RIGHT ;  Surgeon: Cammie Sickle., MD;  Location: [facility];  Service: Orthopedics;  Laterality: Right;  . UPPER GASTROINTESTINAL ENDOSCOPY         Review of Systems As per history of present illness  Objective:   Physical Exam BP (!) 96/58 (BP Location: Left Arm, Patient Position: Sitting, Cuff Size: Normal)   Pulse (!) 56   Ht 5' 3.75" (1.619 m) Comment: height measured without shoes  Wt 120 lb (54.4 kg)   BMI 20.76 kg/m  No acute distress   15 minutes time spent with patient > half in counseling coordination of care

## 2016-11-15 NOTE — Assessment & Plan Note (Signed)
Budesonide 9 mg not 3 RTC 2-3 mos

## 2016-11-19 ENCOUNTER — Encounter: Payer: Self-pay | Admitting: Internal Medicine

## 2016-11-19 ENCOUNTER — Ambulatory Visit (INDEPENDENT_AMBULATORY_CARE_PROVIDER_SITE_OTHER): Payer: Medicare Other | Admitting: Internal Medicine

## 2016-11-19 VITALS — BP 92/58 | HR 55 | Temp 97.9°F | Wt 121.0 lb

## 2016-11-19 DIAGNOSIS — G4762 Sleep related leg cramps: Secondary | ICD-10-CM

## 2016-11-19 MED ORDER — GABAPENTIN 300 MG PO CAPS: ORAL_CAPSULE | ORAL | 0 refills | Status: DC

## 2016-11-19 NOTE — Patient Instructions (Signed)
Try gabapentin 300 mg 1 or 2 capsules at bedtime.

## 2016-11-19 NOTE — Progress Notes (Signed)
   Subjective:    Patient ID: [name], female    DOB: 20-Feb-1941, 76 y.o.   MRN: [id number]  HPI Persistent problems with nocturnal leg cramps. Has severe cramp but she quit when she drinks more than 2 glasses of wine. This morning had a severe cramp around 7 AM in her lower leg.  Have recommended magnesium. She thought it might be helping until recently. Has been taking B complex vitamins and vitamin D.  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 Told her it would take a month or 2 before she could determine if it was helping or not. She may try it if she so desires.  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 Dr. Carlean Purl is treating this.    Review of Systems see above     Objective:   Physical Exam Not examined but spent 15 minutes with patient and her husband discussing leg cramps. Going to try low-dose gabapentin 300 mg at bedtime to see if symptoms resolve.       Assessment & Plan:  Nocturnal leg cramps  Plan: Gabapentin 300 mg 1 or 2 capsules before bedtime and call with progress report in 2-4 weeks.

## 2016-12-12 ENCOUNTER — Telehealth: Payer: Self-pay | Admitting: Internal Medicine

## 2016-12-12 NOTE — Telephone Encounter (Signed)
Pt is aware and discussed with her that if she can not talk with GI to go to the ER incase she is having issues with her bowel per Dr. Renold Genta. She stated that it is not an emergency and she is not going to the emergency room. She is calling GI.

## 2016-12-12 NOTE — Telephone Encounter (Signed)
C/O abdominal pain, no vomiting, no diarrhea; just pain.  On a scale of 1-10, a 4.  Said it reminds her of when she was on Celebrex about 20 years ago, she had abdominal wall pain.  She has had coffee, peanuts, coke.  It is not more pain on one side or the other, it is the complete abdominal wall.  She hasn't tried anything new in the way of food that she can think of that would make her stomach ache.  She has taken 6 Tums and she just cannot seem to get any relief from this pain that she's having.  What should she try or do?    Pharmacy:  [facility] [place] number to contact patient:  [phone]

## 2016-12-12 NOTE — Telephone Encounter (Signed)
She needs to call GI. They have seen her recently

## 2016-12-12 NOTE — Telephone Encounter (Signed)
Left message on machine to call back  

## 2016-12-13 NOTE — Telephone Encounter (Signed)
Left message to call back  

## 2017-01-07 ENCOUNTER — Telehealth: Payer: Self-pay | Admitting: Internal Medicine

## 2017-01-07 NOTE — Telephone Encounter (Signed)
Please advise if ok to cancel her upcoming appt?

## 2017-01-07 NOTE — Telephone Encounter (Signed)
Here are my recommendations:  Stop the Entocort if she has been feeling well without diarrhea for several weeks. If not so then let me know.  May cancel appointment.  Call back if she gets sxs again.

## 2017-01-07 NOTE — Telephone Encounter (Signed)
Patient notified She will call back if her symptoms return appt cancelled.

## 2017-01-10 ENCOUNTER — Ambulatory Visit (INDEPENDENT_AMBULATORY_CARE_PROVIDER_SITE_OTHER): Payer: Medicare Other | Admitting: Internal Medicine

## 2017-01-10 DIAGNOSIS — Z23 Encounter for immunization: Secondary | ICD-10-CM

## 2017-01-10 NOTE — Progress Notes (Signed)
Flu shot given in left deltoid, pt tolerated well. Lenise Arena, CMA

## 2017-01-10 NOTE — Patient Instructions (Signed)
Flu shot given

## 2017-01-14 ENCOUNTER — Ambulatory Visit: Payer: Medicare Other | Admitting: Internal Medicine

## 2017-01-31 ENCOUNTER — Telehealth: Payer: Self-pay | Admitting: Internal Medicine

## 2017-01-31 NOTE — Telephone Encounter (Signed)
Having ankle issues - pain (arthritis).  This is the ankle that has pained her since childhood from an injury long ago.  She's been taking 2 Tylenol 629m a day and 4 a day if she's going out for the night.  It isn't working for the pain.  She wants to know if she can take a couple of Aspirin instead for the pain?  Says she cannot take NSAIDS at all.       Says her stomach issues are pretty well controlled at this particular time with the medications.    Best # for contact:  [phone]

## 2017-01-31 NOTE — Telephone Encounter (Signed)
What does Dr. Oneida Alar recommend?

## 2017-01-31 NOTE — Telephone Encounter (Signed)
Pt is aware.  

## 2017-02-16 ENCOUNTER — Other Ambulatory Visit: Payer: Self-pay | Admitting: Internal Medicine

## 2017-02-25 ENCOUNTER — Other Ambulatory Visit: Payer: Self-pay | Admitting: Internal Medicine

## 2017-02-25 NOTE — Telephone Encounter (Signed)
[name] uses this medicine once every 4 days or so for diarrhea she is having. Wants to have Rx on hand.

## 2017-02-28 NOTE — Telephone Encounter (Signed)
See previous note Sir, thank you.

## 2017-02-28 NOTE — Telephone Encounter (Signed)
Last contact w/ patient was that she was well and off the Entocort  Please find out what is going on w/ her and does she need to restart?

## 2017-03-05 ENCOUNTER — Telehealth: Payer: Self-pay

## 2017-03-05 NOTE — Telephone Encounter (Signed)
Left patient a detailed message explaining to MyChart [place] and to call back and set up an appointment with Dr Carlean Purl. Budesonide refilled as approved.

## 2017-03-05 NOTE — Telephone Encounter (Signed)
[name] got my message and called back to set up an office visit appointment for January. She is doing well and said to tell Dr Carlean Purl sorry, she found a bottle of the budesonide at home.  She will call the pharmacy and let them know since I've already sent it in. She said she does okay as long as she doesn't eat to much ice cream.

## 2017-03-05 NOTE — Telephone Encounter (Signed)
I am not opposed to that if it works for her but it does not make complete sense to me that it is what is helping her.  We can refill x 1  I would like her to schedule a follow-up next avail or send me a My Chart message that explains what she is experiencing re: symptoms

## 2017-03-25 ENCOUNTER — Telehealth: Payer: Self-pay

## 2017-03-25 NOTE — Telephone Encounter (Signed)
Pt was notified of Dr. Verlene Mayer instructions, pt verbalized understanding.

## 2017-03-25 NOTE — Telephone Encounter (Signed)
Patient called to get advise on whether she should continue to get mammograms due to her age? Also she said she's been feeling light headed for about 6 months and some her medical friends advised her to contact her PCP and get advise on what to do? Please advise thank you.

## 2017-03-25 NOTE — Telephone Encounter (Signed)
Yes she shouldd get annual mammograms.Lightheadedness is common. Make sure she is well hydrated and does not change positions too quickly.

## 2017-04-01 NOTE — Telephone Encounter (Signed)
Discontinued/re-ordered.  Thanks.

## 2017-04-11 ENCOUNTER — Other Ambulatory Visit: Payer: Self-pay | Admitting: Internal Medicine

## 2017-04-11 NOTE — Telephone Encounter (Signed)
Okay to refill Lomotil x1

## 2017-04-11 NOTE — Telephone Encounter (Signed)
May we refill Sir, has January appointment.

## 2017-04-11 NOTE — Telephone Encounter (Signed)
[facility]

## 2017-05-06 ENCOUNTER — Encounter: Payer: Self-pay | Admitting: Internal Medicine

## 2017-05-14 ENCOUNTER — Encounter: Payer: Self-pay | Admitting: Internal Medicine

## 2017-05-14 ENCOUNTER — Ambulatory Visit: Payer: Medicare Other | Admitting: Internal Medicine

## 2017-05-14 VITALS — BP 112/54 | HR 70 | Ht 64.0 in | Wt 120.8 lb

## 2017-05-14 DIAGNOSIS — K52832 Lymphocytic colitis: Secondary | ICD-10-CM | POA: Diagnosis not present

## 2017-05-14 DIAGNOSIS — R32 Unspecified urinary incontinence: Secondary | ICD-10-CM

## 2017-05-14 DIAGNOSIS — K582 Mixed irritable bowel syndrome: Secondary | ICD-10-CM

## 2017-05-14 DIAGNOSIS — R159 Full incontinence of feces: Secondary | ICD-10-CM

## 2017-05-14 MED ORDER — BUDESONIDE 3 MG PO CPEP: ORAL_CAPSULE | ORAL | 0 refills | Status: DC

## 2017-05-14 NOTE — Patient Instructions (Signed)
  You will be contacted by [facility] about sitting up an appointment for pelvic floor dysfunction.  Take your budesonide every other day per Dr Carlean Purl.   Follow up with [place] in 2-3 months.    I appreciate the opportunity to care for you. Silvano Rusk, MD, [facility]

## 2017-05-14 NOTE — Progress Notes (Signed)
[name] 77 y.o. 1940/10/19 [id number]  Assessment & Plan:   Encounter Diagnoses  Name Primary?  . Irritable bowel syndrome with both constipation and diarrhea Yes  . Lymphocytic colitis   . Urinary and fecal incontinence     We reviewed the nature of irritable bowel syndrome and lymphocytic colitis some.  I am not convinced those are her primary problems right now though they are in the background.  I think she may have more of an anorectal function problem pelvic floor weakness so I will refer her for pelvic floor physical therapy.  Discussed possibly using anorectal manometry to further understand things but I think the history is adequate and worth at least an initial evaluation and probably subsequent treatment.  I do not know how much colitis is actually active right now but we decided that she should take 3 mg of budesonide every other day and see what happens.  I have explained to her how Lomotil can cause constipation for several days and hopefully we can get to the point where she does not need that.    I will see her back in about 2 months.  I appreciate the opportunity to care for this patient. CC: [name], MD   Subjective:   Chief Complaint: Follow-up of colitis, cannot get bowels regulated  HPI The patient is here for follow-up, I was supposed to see her in the fall but for some reason she has not come.  She is call back and forth complaining of diarrhea at times, she has Entocort represcribed and she is also using Lomotil intermittently she tells me that if she takes Entocort regularly she gets terribly constipated.  She is she is taking it about every 4 days and then sometimes haphazardly as needed when she has days of loose stools.  She will also use Lomotil.  She has reduced the amount of ice cream that she eats and thinks that has improved things as well, earlier in the year she thinks she was overdoing that and wonders if she is not lactose  intolerant  She sometimes has fecal incontinence particularly with urgent loose bowel movements.  She also has difficulty producing a stool and strains.  There is some intermittent urinary incontinence reported she says she has been doing Kegel exercises for the past 30 years but still has some leakage at times.   Allergies  Allergen Reactions  . Celebrex [Celecoxib] Nausea And Vomiting    All cox 2 inbibators/ severe abd pain   Current Meds  Medication Sig  . acetaminophen (TYLENOL) 500 MG tablet Take 500 mg by mouth as needed.  . Biotin (BIOTIN 5000) 5 MG CAPS Take by mouth daily.  . budesonide (ENTOCORT EC) 3 MG 24 hr capsule TAKE 3 CAPSULES BY MOUTH DAILY.  . calcium carbonate (TUMS EX) 750 MG chewable tablet Chew 1,500 mg by mouth.  . clobetasol (OLUX) 0.05 % topical foam APPLY TO SCALP ONCE DAILY  . Clobetasol Propionate 0.05 % shampoo APPLY ON THE SKIN AND SCALP D  . cyanocobalamin (V-R VITAMIN B-12) 500 MCG tablet Take 500 mcg by mouth.  . dicyclomine (BENTYL) 20 MG tablet Take 1 tablet (20 mg total) by mouth 3 (three) times daily before meals.  . diphenoxylate-atropine (LOMOTIL) 2.5-0.025 MG tablet TAKE 1 TABLET BY MOUTH 4 TIMES A DAY AS NEEDED FOR DIARRHEA OR LOOSE STOOLS  . doxycycline (VIBRAMYCIN) 50 MG capsule TAKE 1 CAPSULE BY MOUTH DAILY, THEN DECREASE TO ONE CAPSULE EVERY OTHER  DAY AFTER A COUPLE OF WEEKS  . esomeprazole (NEXIUM) 40 MG capsule TAKE 1 CAPSULE(40 MG) BY MOUTH TWICE DAILY BEFORE A MEAL  . gabapentin (NEURONTIN) 300 MG capsule Take 1 to 2 tablets prn every night  . hydrocortisone cream 1 % Apply 1 application topically as needed for itching.  . InFLIXimab (REMICADE IV) Inject into the vein. Remicade Infusion every 4 months  . Ketoprofen 10 % CREA Apply topically as needed.  . Loperamide HCl (IMODIUM PO) Take by mouth as needed.  . magnesium oxide (MAG-OX) 400 MG tablet Take 400 mg by mouth daily.  . Multiple Vitamins-Minerals (CENTRUM SILVER PO) Take 1 tablet  by mouth daily.    . Multiple Vitamins-Minerals (EYE VITAMINS) CAPS Take by mouth.  . SOOLANTRA 1 % CREA APPLY ON THE AFFECTED AREA OF SKIN AS DIRECTED.  Marland Kitchen VITAMIN D, CHOLECALCIFEROL, PO Take 5,000 Units by mouth 3 (three) times a week.  . vitamin E 400 UNIT capsule Take 400 Units by mouth daily.   Past Medical History:  Diagnosis Date  . Allergy    seasonal  . Anemia   . Cataract    bil cateracts removed  . Chronic constipation   . Diarrhea    chronic diarrheafor over 2 months  . GERD (gastroesophageal reflux disease)   . Hyperlipidemia   . 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 Rheumatoid arthritis ([place])   . Status post dilation of esophageal narrowing 2014  . Wears glasses    Past Surgical History:  Procedure Laterality Date  . CATARACT EXTRACTION  06/30/2016 -right eye   left eye in 08/15/16  . COLONOSCOPY    . DILATION AND CURETTAGE OF UTERUS  2009  . ESOPHAGEAL DILATION  05/31/1997  . REPAIR EXTENSOR TENDON Right 05/14/2013   Procedure: LONG FINGER EXTENSOR TO ELEVATE RING AND SMALL FINGERS ;  Surgeon: Cammie Sickle., MD;  Location: [place];  Service: Orthopedics;  Laterality: Right;  . seborrheic keratosis  09/03/12   inflamed, excised from forehead  . TENDON TRANSFER Right 09/03/2013   Procedure: RIGHT HAND FLEXOR CARPI RADIALUS TRANSFER TO LONG/RING/SMALL;  Surgeon: Cammie Sickle., MD;  Location: [place];  Service: Orthopedics;  Laterality: Right;  . TONSILLECTOMY    . TUBAL LIGATION    . ULNAR HEAD EXCISION Right 05/14/2013   Procedure: ARTHOPLASTY DISTAL RADIAL ULNAR JOINT, RIGHT ;  Surgeon: Cammie Sickle., MD;  Location: [place];  Service: Orthopedics;  Laterality: Right;  . UPPER GASTROINTESTINAL ENDOSCOPY     Social History   Social History Narrative   Health Care POA: Husband, Fred   Emergency Contact: Fred   End of Life Plan:    Who lives with you: husband   Son, in  [place]   Any pets: 3 cats   Diet: Pt has a varied diet of protein, starch, and vegetables.   Exercise: Pt works with trainer 2x a week for 60 minutes   Seatbelts: Pt reports wearing seatbelt when in vehicle.   Sun Exposure/Protection: Pt reports not using sun protection.   Hobbies: gardening, reading, concerts               family history includes COPD in her father; Colon cancer in her paternal uncle; Heart disease in her mother; Osteoporosis in her maternal aunt, maternal grandmother, and mother.   Review of Systems As per HPI  Objective:   Physical Exam BP (!) 112/54   Pulse  70   Ht 5' 4"  (1.626 m)   Wt 120 lb 12.8 oz (54.8 kg)   BMI 20.74 kg/m  No acute distress  15 minutes time spent with patient > half in counseling coordination of care

## 2017-05-16 ENCOUNTER — Ambulatory Visit: Payer: Medicare Other | Attending: Internal Medicine | Admitting: Physical Therapy

## 2017-05-16 ENCOUNTER — Other Ambulatory Visit: Payer: Self-pay

## 2017-05-16 DIAGNOSIS — M6281 Muscle weakness (generalized): Secondary | ICD-10-CM | POA: Diagnosis present

## 2017-05-16 DIAGNOSIS — R279 Unspecified lack of coordination: Secondary | ICD-10-CM | POA: Insufficient documentation

## 2017-05-16 DIAGNOSIS — R293 Abnormal posture: Secondary | ICD-10-CM | POA: Diagnosis present

## 2017-05-16 NOTE — Patient Instructions (Signed)
Toileting Techniques for Bowel Movements (Defecation) Using your belly (abdomen) and pelvic floor muscles to have a bowel movement is usually instinctive.  Sometimes people can have problems with these muscles and have to relearn proper defecation (emptying) techniques.  If you have weakness in your muscles, organs that are falling out, decreased sensation in your pelvis, or ignore your urge to go, you may find yourself straining to have a bowel movement.  You are straining if you are: . holding your breath or taking in a huge gulp of air and holding it  . keeping your lips and jaw tensed and closed tightly . turning red in the face because of excessive pushing or forcing . developing or worsening your  hemorrhoids . getting faint while pushing . not emptying completely and have to defecate many times a day  If you are straining, you are actually making it harder for yourself to have a bowel movement.  Many people find they are pulling up with the pelvic floor muscles and closing off instead of opening the anus. Due to lack pelvic floor relaxation and coordination the abdominal muscles, one has to work harder to push the feces out.  Many people have never been taught how to defecate efficiently and effectively.  Notice what happens to your body when you are having a bowel movement.  While you are sitting on the toilet pay attention to the following areas: . Jaw and mouth position . Angle of your hips   . Whether your feet touch the ground or not . Arm placement  . Spine position . Waist . Belly tension . Anus (opening of the anal canal)  An Evacuation/Defecation Plan   Here are the 4 basic points:  1. Lean forward enough for your elbows to rest on your knees 2. Support your feet on the floor or use a low stool if your feet don't touch the floor  3. Push out your belly as if you have swallowed a beach ball-you should feel a widening of your waist 4. Open and relax your pelvic floor  muscles, rather than tightening around the anus      The following conditions my require modifications to your toileting posture:  . If you have had surgery in the past that limits your back, hip, pelvic, knee or ankle flexibility . Constipation   Your healthcare practitioner may make the following additional suggestions and adjustments:  1) Sit on the toilet  a) Make sure your feet are supported. b) Notice your hip angle and spine position-most people find it effective to lean forward or raise their knees, which can help the muscles around the anus to relax  c) When you lean forward, place your forearms on your thighs for support  2) Relax suggestions a) Breath deeply in through your nose and out slowly through your mouth as if you are smelling the flowers and blowing out the candles. b) To become aware of how to relax your muscles, contracting and releasing muscles can be helpful.  Pull your pelvic floor muscles in tightly by using the image of holding back gas, or closing around the anus (visualize making a circle smaller) and lifting the anus up and in.  Then release the muscles and your anus should drop down and feel open. Repeat 5 times ending with the feeling of relaxation. c) Keep your pelvic floor muscles relaxed; let your belly bulge out. d) The digestive tract starts at the mouth and ends at the anal opening, so  be sure to relax both ends of the tube.  Place your tongue on the roof of your mouth with your teeth separated.  This helps relax your mouth and will help to relax the anus at the same time.  3) Empty (defecation) a) Keep your pelvic floor and sphincter relaxed, then bulge your anal muscles.  Make the anal opening wide.  b) Stick your belly out as if you have swallowed a beach ball. c) Make your belly wall hard using your belly muscles while continuing to breathe. Doing this makes it easier to open your anus. d) Breath out and give a grunt (or try using other sounds such  as ahhhh, shhhhh, ohhhh or grrrrrrr).  4) Finish a) As you finish your bowel movement, pull the pelvic floor muscles up and in.  This will leave your anus in the proper place rather than remaining pushed out and down. If you leave your anus pushed out and down, it will start to feel as though that is normal and give you incorrect signals about needing to have a bowel movement.    [facility] [place] [address] [place], [place] [address]  Incorrect Posture    Correct posture

## 2017-05-16 NOTE — Therapy (Signed)
[facility] [address] [place], [place], [address] Phone: [phone]   Fax:  [phone]  Physical Therapy Evaluation  Patient Details  Name: [name] MRN: [id number] Date of Birth: 05/05/1940 Referring Provider: Gatha Mayer, MD   Encounter Date: 05/16/2017  PT End of Session - 05/16/17 1610    Visit Number  1    Date for PT Re-Evaluation  06/27/17    PT Start Time  1522    PT Stop Time  1608    PT Time Calculation (min)  46 min    Activity Tolerance  Patient tolerated treatment well    Behavior During Therapy  [facility] for tasks assessed/performed       Past Medical History:  Diagnosis Date  . Allergy    seasonal  . Anemia   . Cataract    bil cateracts removed  . Chronic constipation   . Diarrhea    chronic diarrheafor over 2 months  . GERD (gastroesophageal reflux disease)   . Hyperlipidemia   . 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 Rheumatoid arthritis ([place])   . Status post dilation of esophageal narrowing 2014  . Wears glasses     Past Surgical History:  Procedure Laterality Date  . CATARACT EXTRACTION  06/30/2016 -right eye   left eye in 08/15/16  . COLONOSCOPY    . DILATION AND CURETTAGE OF UTERUS  2009  . ESOPHAGEAL DILATION  05/31/1997  . REPAIR EXTENSOR TENDON Right 05/14/2013   Procedure: LONG FINGER EXTENSOR TO ELEVATE RING AND SMALL FINGERS ;  Surgeon: Cammie Sickle., MD;  Location: [place];  Service: Orthopedics;  Laterality: Right;  . seborrheic keratosis  09/03/12   inflamed, excised from forehead  . TENDON TRANSFER Right 09/03/2013   Procedure: RIGHT HAND FLEXOR CARPI RADIALUS TRANSFER TO LONG/RING/SMALL;  Surgeon: Cammie Sickle., MD;  Location: [place];  Service: Orthopedics;  Laterality: Right;  . TONSILLECTOMY    . TUBAL LIGATION    . ULNAR HEAD EXCISION Right 05/14/2013   Procedure: ARTHOPLASTY DISTAL  RADIAL ULNAR JOINT, RIGHT ;  Surgeon: Cammie Sickle., MD;  Location: [place];  Service: Orthopedics;  Laterality: Right;  . UPPER GASTROINTESTINAL ENDOSCOPY      There were no vitals filed for this visit.   Subjective Assessment - 05/16/17 1520    Subjective  Pt having urinary and fecal incontinence and fecal incontinence.  Currently she is taking medication and it is helping the incontinence.  Sometimes can hold the urge to go to the bathroom and sometimes cannot.  This morning did have incontinence and felt like the entire contents of the bowel.  Prior to this starting the leakage was having a bowel movement 2x/week.  Currently she is having liquid stools and reports she cannot control that, but can when it is solid.      Currently in Pain?  No/denies         [facility] PT Assessment - 05/16/17 0001      Assessment   Medical Diagnosis  R32,R15.9 (ICD-10-CM) - Urinary and fecal incontinence    Referring Provider  Gatha Mayer, MD    Onset Date/Surgical Date  -- last year    Next MD Visit  2 months    Prior Therapy  No was given exercises years ago for urinary leakage      Precautions   Precautions  None  Restrictions   Weight Bearing Restrictions  No      Balance Screen   Has the patient fallen in the past 6 months  No      [place] residence    Living Arrangements  Spouse/significant other      Prior Function   Level of [place]  Retired    Leisure  exercises regularly      Cognition   Overall Cognitive Status  Within Functional Limits for tasks assessed      Observation/Other Assessments   Focus on Therapeutic Outcomes (FOTO)   40% limited based on symptoms of incontinence    Other Surveys   Other Surveys      Posture/Postural Control   Posture/Postural Control  Postural limitations    Postural Limitations  Rounded Shoulders;Decreased lumbar lordosis;Increased thoracic  kyphosis;Posterior pelvic tilt      ROM / Strength   AROM / PROM / Strength  Strength      Strength   Strength Assessment Site  Hip;Knee    Right/Left Hip  Right;Left    Right Hip Flexion  4/5    Right Hip Extension  4/5    Right Hip External Rotation   4/5    Right Hip Internal Rotation  4/5    Right Hip ABduction  4-/5    Right Hip ADduction  4-/5    Left Hip Flexion  4/5    Left Hip Extension  4/5    Left Hip External Rotation  4/5    Left Hip Internal Rotation  4/5    Left Hip ABduction  4-/5    Left Hip ADduction  4-/5    Right/Left Knee  Right;Left    Right Knee Flexion  5/5    Right Knee Extension  4/5    Left Knee Flexion  5/5    Left Knee Extension  4/5      Palpation   SI assessment   normal    Palpation comment  tightness in diaphragm      Ambulation/Gait   Gait Pattern  Within Functional Limits             Objective measurements completed on examination: See above findings.    Pelvic Floor Special Questions - 05/16/17 0001    Prior Pelvic/Prostate Exam  Yes    Date of Last Pelvic/Prostate Exam  -- annually    Result Pelvic/Prostate Exam   -- Normal    Are you Pregnant or attempting pregnancy?  No    Prior Pregnancies  Yes    Number of Pregnancies  1    Number of Vaginal Deliveries  1    Currently Sexually Active  Yes    Is this Painful  No    Urinary Leakage  No    Urinary urgency  Yes    Urinary frequency  normal    Fecal incontinence  Yes    Fluid intake  water, diet coke, coffee, alcohol - drink throughout the day all decaf    Skin Integrity  Intact;Hemorroids    Pelvic Floor Internal Exam  pt informed and consent given to perform internal soft tissue assessment    Exam Type  Rectal    Sensation  normal    Palpation  no trigger points palpated, normal tone    Strength  fair squeeze, definite lift    Strength # of reps  4    Strength # of seconds  4    Tone  normal                 PT Short Term Goals - 05/16/17 1757       PT SHORT TERM GOAL #1   Title  independent with initial HEP    Time  4    Period  Weeks    Status  New    Target Date  06/13/17      PT SHORT TERM GOAL #2   Title  able to perform diaphragmatic breath with ribcage excursion observed    Time  4    Period  Weeks    Status  New    Target Date  06/13/17      PT SHORT TERM GOAL #3   Title  reports ability to have BM without forcing using correct toileting techniques    Period  Weeks    Status  New    Target Date  06/13/17        PT Long Term Goals - 05/16/17 1759      PT LONG TERM GOAL #1   Title  ind with advanced HEP    Time  8    Period  Weeks    Status  New    Target Date  07/11/17      PT LONG TERM GOAL #2   Title  reports ability to hold urge to have BM for > or = to 15 minutes in order to get to rest room without leakage.    Time  8    Period  Weeks    Status  New    Target Date  07/11/17      PT LONG TERM GOAL #3   Title  LE 4+/5 MMT throughout for improved core and pelvic stability during functional activities    Time  8    Period  Weeks    Status  New    Target Date  07/11/17      PT LONG TERM GOAL #4   Title  able to demonstrate pelvic floor bulge with breathing due to improved muscle coordination    Time  8    Period  Weeks    Status  New    Target Date  07/11/17             Plan - 05/16/17 1626    Clinical Impression Statement  Pt presents to therapy with fecal incontinence that has been worsening and varying predictability.  She states that she usually has been constipated prior to this fecal incontinence.  She reports when her stool is formed she does not have incontinence.  Pt states she has some urinary incontinence but that is not a problem unless she doesn't do her kegel exercises . Pt demonstrates standing posture with collapsing of her ribcage and increased rounding of shoulders.  She has decreased lumbar lordosis in standing with center of mass shifting posteriorly.  Pt has decreased  ribcage excursion during respiration and tight thoracic diaphragm.  Pt has strong pelvic floor contraction during rectal assessment.  However, she tightens intermittently when attempting to evacuate one finger.  Pt also how low endurance and able to maintain contraction for 4 sec at a time for 4 reps. Pt is currently limited with activities due to fecal leakage occuring at unknown times.  Pt states she is manageing with medication but it is new so she is unsure.  Pt also has bilateral LE weakness throughout.  She will benefit  from skilled PT to address these impairments for optimal function of self care activities.    History and Personal Factors relevant to plan of care:  chronic constipation and diarrhea, colitis, history includes 2 vaginal deliveries    Clinical Presentation  Unstable    Clinical Presentation due to:  pt has unpredictable symptoms with ability to control and losing control of bowels    Clinical Decision Making  Moderate    Rehab Potential  Good    Clinical Impairments Affecting Rehab Potential  chronic constipation and diarrhea, colitis    PT Frequency  1x / week    PT Duration  8 weeks    PT Treatment/Interventions  ADLs/Self Care Home Management;Biofeedback;Taping;Dry needling;Passive range of motion;Patient/family education;Therapeutic exercise;Balance training;Neuromuscular re-education;Therapeutic activities;Stair training;Gait training;Ultrasound;Moist Heat;Cryotherapy;Electrical Stimulation    PT Next Visit Plan  review toilet tech, posture, breathing, diaphragm release, thoracic mobility, hip and knee strength, biofeedback and bulging pelvic floor    Consulted and Agree with Plan of Care  Patient       Patient will benefit from skilled therapeutic intervention in order to improve the following deficits and impairments:  Postural dysfunction, Decreased strength, Decreased coordination  Visit Diagnosis: Unspecified lack of coordination  Abnormal posture  Muscle  weakness (generalized)     Problem List Patient Active Problem List   Diagnosis Date Noted  . 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 Right hip pain 02/24/2016  . Rheumatoid arthritis ([place]) 06/22/2015  . Knee pain, right 06/22/2015  . Degenerative tear of medial meniscus of left knee 11/25/2014  . Right wrist pain 04/02/2013  . Double vision 08/26/2012  . H/O long-term treatment with high-risk medication 11/08/2011  . Achilles tendon disorder 10/30/2011  . Osteoarthritis of left ankle 09/21/2011  . Tinnitus 09/11/2011  . Hip pain, left 12/14/2010  . ALLERGIC RHINITIS, CHRONIC 11/23/2008  . CONSTIPATION, CHRONIC 11/23/2008  . ACNE, ROSACEA 11/23/2008  . At risk for osteopenia 11/11/2007  . Metatarsalgia of both feet 08/27/2006  . PLANTAR FACIITIS 08/27/2006  . FOOT PAIN, BILATERAL 08/27/2006  . HYPERLIPIDEMIA 06/27/2006  . GASTROESOPHAGEAL REFLUX, NO ESOPHAGITIS 06/27/2006  . Candiss Norse SITES 06/27/2006    Zannie Cove, PT 05/16/2017, 6:33 PM  [facility] [address] [place], [place], [address] Phone: [phone]   Fax:  [phone]  Name: ANYELI HOCKENBURY MRN: [id number] Date of Birth: 05/07/1940

## 2017-05-23 ENCOUNTER — Ambulatory Visit: Payer: Medicare Other | Admitting: Physical Therapy

## 2017-05-23 DIAGNOSIS — R293 Abnormal posture: Secondary | ICD-10-CM

## 2017-05-23 DIAGNOSIS — M6281 Muscle weakness (generalized): Secondary | ICD-10-CM

## 2017-05-23 DIAGNOSIS — R279 Unspecified lack of coordination: Secondary | ICD-10-CM | POA: Diagnosis not present

## 2017-05-23 NOTE — Therapy (Signed)
[facility] [address], [place], [place], [address] Phone: [phone]   Fax:  [phone]  Physical Therapy Treatment  Patient Details  Name: [name] MRN: [id number] Date of Birth: [birth year] Referring Provider: Gatha Mayer, MD   Encounter Date: 05/23/2017  PT End of Session - 05/23/17 1342    Visit Number  2    Date for PT Re-Evaluation  06/27/17    PT Start Time  0930    PT Stop Time  1013    PT Time Calculation (min)  43 min    Activity Tolerance  Patient tolerated treatment well    Behavior During Therapy  [facility] for tasks assessed/performed       Past Medical History:  Diagnosis Date  . Allergy    seasonal  . Anemia   . Cataract    bil cateracts removed  . Chronic constipation   . Diarrhea    chronic diarrheafor over 2 months  . GERD (gastroesophageal reflux disease)   . Hyperlipidemia   . 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 Rheumatoid arthritis ([place])   . Status post dilation of esophageal narrowing 2014  . Wears glasses     Past Surgical History:  Procedure Laterality Date  . CATARACT EXTRACTION  06/30/2016 -right eye   left eye in 08/15/16  . COLONOSCOPY    . DILATION AND CURETTAGE OF UTERUS  2009  . ESOPHAGEAL DILATION  05/31/1997  . REPAIR EXTENSOR TENDON Right 05/14/2013   Procedure: LONG FINGER EXTENSOR TO ELEVATE RING AND SMALL FINGERS ;  Surgeon: Cammie Sickle., MD;  Location: [place];  Service: Orthopedics;  Laterality: Right;  . seborrheic keratosis  09/03/12   inflamed, excised from forehead  . TENDON TRANSFER Right 09/03/2013   Procedure: RIGHT HAND FLEXOR CARPI RADIALUS TRANSFER TO LONG/RING/SMALL;  Surgeon: Cammie Sickle., MD;  Location: [place];  Service: Orthopedics;  Laterality: Right;  . TONSILLECTOMY    . TUBAL LIGATION    . ULNAR HEAD EXCISION Right 05/14/2013   Procedure: ARTHOPLASTY DISTAL  RADIAL ULNAR JOINT, RIGHT ;  Surgeon: Cammie Sickle., MD;  Location: [place];  Service: Orthopedics;  Laterality: Right;  . UPPER GASTROINTESTINAL ENDOSCOPY      There were no vitals filed for this visit.  Subjective Assessment - 05/23/17 1006    Subjective  Pt reports she has been feeling good, not sick and no leakage since last time.  She states she digitally stimulates anal sphincter to have BM and she would rather not do that but has to at this time.      Limitations  Other (comment) self care and toileting    Currently in Pain?  No/denies                      [facility] Adult PT Treatment/Exercise - 05/23/17 0001      Neuro Re-ed    Neuro Re-ed Details   breathing in supine and standing - supine with tactile cues on abdomen and pelvic floor for expansion and contraction with breath, ball squeeze for pelvic floor lifting      Exercises   Exercises  Lumbar      Manual Therapy   Manual Therapy  Myofascial release    Myofascial Release  to thoracic diaphragm during breathing education as mentioned above  PT Education - 05/23/17 1013    Education provided  Yes    Education Details  balloon breathing    Person(s) Educated  Patient    Methods  Explanation;Demonstration;Handout    Comprehension  Verbalized understanding;Returned demonstration       PT Short Term Goals - 05/16/17 1757      PT SHORT TERM GOAL #1   Title  independent with initial HEP    Time  4    Period  Weeks    Status  New    Target Date  06/13/17      PT SHORT TERM GOAL #2   Title  able to perform diaphragmatic breath with ribcage excursion observed    Time  4    Period  Weeks    Status  New    Target Date  06/13/17      PT SHORT TERM GOAL #3   Title  reports ability to have BM without forcing using correct toileting techniques    Period  Weeks    Status  New    Target Date  06/13/17        PT Long Term Goals - 05/16/17 1759      PT LONG  TERM GOAL #1   Title  ind with advanced HEP    Time  8    Period  Weeks    Status  New    Target Date  07/11/17      PT LONG TERM GOAL #2   Title  reports ability to hold urge to have BM for > or = to 15 minutes in order to get to rest room without leakage.    Time  8    Period  Weeks    Status  New    Target Date  07/11/17      PT LONG TERM GOAL #3   Title  LE 4+/5 MMT throughout for improved core and pelvic stability during functional activities    Time  8    Period  Weeks    Status  New    Target Date  07/11/17      PT LONG TERM GOAL #4   Title  able to demonstrate pelvic floor bulge with breathing due to improved muscle coordination    Time  8    Period  Weeks    Status  New    Target Date  07/11/17            Plan - 05/23/17 1343    Clinical Impression Statement  Pt demonstrates immproved ribcage excursion with tactile cues and myfascial release during breathing exercises.  Pt is able to perform diaphragmatic breathing with a lot of thought.  She still has difficulty coordinating pelvic floor movments with breathing.  Pt will benefit from skilled PT for re-training of pelvic floor for improved coordination for greater bowel and bladder control.    Clinical Impairments Affecting Rehab Potential  chronic constipation and diarrhea, colitis    PT Treatment/Interventions  ADLs/Self Care Home Management;Biofeedback;Taping;Dry needling;Passive range of motion;Patient/family education;Therapeutic exercise;Balance training;Neuromuscular re-education;Therapeutic activities;Stair training;Gait training;Ultrasound;Moist Heat;Cryotherapy;Electrical Stimulation    PT Next Visit Plan  breathing and bulging pelvic floor with internal STM, hip and knee strength and mobility    Consulted and Agree with Plan of Care  Patient       Patient will benefit from skilled therapeutic intervention in order to improve the following deficits and impairments:     Visit Diagnosis: Unspecified  lack of coordination  Abnormal posture  Muscle weakness (generalized)     Problem List Patient Active Problem List   Diagnosis Date Noted  . 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 Right hip pain 02/24/2016  . Rheumatoid arthritis ([place]) 06/22/2015  . Knee pain, right 06/22/2015  . Degenerative tear of medial meniscus of left knee 11/25/2014  . Right wrist pain 04/02/2013  . Double vision 08/26/2012  . H/O long-term treatment with high-risk medication 11/08/2011  . Achilles tendon disorder 10/30/2011  . Osteoarthritis of left ankle 09/21/2011  . Tinnitus 09/11/2011  . Hip pain, left 12/14/2010  . ALLERGIC RHINITIS, CHRONIC 11/23/2008  . CONSTIPATION, CHRONIC 11/23/2008  . ACNE, ROSACEA 11/23/2008  . At risk for osteopenia 11/11/2007  . Metatarsalgia of both feet 08/27/2006  . PLANTAR FACIITIS 08/27/2006  . FOOT PAIN, BILATERAL 08/27/2006  . HYPERLIPIDEMIA 06/27/2006  . GASTROESOPHAGEAL REFLUX, NO ESOPHAGITIS 06/27/2006  . Candiss Norse SITES 06/27/2006    Zannie Cove, PT 05/23/2017, 1:49 PM   [facility] [address] [place], [place], [address] Phone: [phone]   Fax:  [phone]  Name: [name] MRN: [id number] Date of Birth: [birth year]

## 2017-05-23 NOTE — Patient Instructions (Signed)
Balloon Breath    Place hands LIGHTLY on belly below navel. Imagine a balloon inside belly. Blow up balloon on breath IN, while you relax the anus and pelvic floor.  Feel pressure down into the pelvic floor as you breathe IN.  Deflate balloon on breath OUT. Contract abdominals and pelvic floor slightly to assist breath OUT. You can place a towel roll or pillow between knees and gently squeeze on the breathe OUT for greater contraction.  Time _5__ minutes/ day.  Copyright  VHI. All rights reserved.

## 2017-05-30 ENCOUNTER — Ambulatory Visit: Payer: Medicare Other | Admitting: Physical Therapy

## 2017-05-30 ENCOUNTER — Encounter: Payer: Self-pay | Admitting: Physical Therapy

## 2017-05-30 DIAGNOSIS — R293 Abnormal posture: Secondary | ICD-10-CM

## 2017-05-30 DIAGNOSIS — R279 Unspecified lack of coordination: Secondary | ICD-10-CM | POA: Diagnosis not present

## 2017-05-30 DIAGNOSIS — M6281 Muscle weakness (generalized): Secondary | ICD-10-CM

## 2017-05-30 NOTE — Patient Instructions (Signed)
   Piriformis Stretch (Figure Four)  While lying down, bend up one knee keeping the foot on the mat or floor. Bend opposite leg and cross ankle over the bent knee. Gently push inside of crossed leg at knee. You should feel the stretch in the back of the buttock of crossed leg.  Hold while breathing into belly, hold 30 sec and repeat 5 x/ day     SINGLE KNEE TO CHEST STRETCH - SKTC  While Lying on your back, hold your knee and gently pull it up towards your chest. Hold right knee to chest while breathing into belly, hold 30 sec and repeat 5 x/ day    Hold stretch while breathing into belly, holding 2 minutes/day  [facility] [address] [place], [place] [address] Phone # [phone] Fax [phone]

## 2017-05-30 NOTE — Therapy (Signed)
[facility] [address] [place], [place], [address] Phone: [phone]   Fax:  [phone]  Physical Therapy Treatment  Patient Details  Name: [name] MRN: [id number] Date of Birth: 1940/12/25 Referring Provider: Gatha Mayer, MD   Encounter Date: 05/30/2017  PT End of Session - 05/30/17 1500    Visit Number  3    Date for PT Re-Evaluation  06/27/17    PT Start Time  1025    PT Stop Time  1530    PT Time Calculation (min)  43 min    Activity Tolerance  Patient tolerated treatment well    Behavior During Therapy  [facility] for tasks assessed/performed       Past Medical History:  Diagnosis Date  . Allergy    seasonal  . Anemia   . Cataract    bil cateracts removed  . Chronic constipation   . Diarrhea    chronic diarrheafor over 2 months  . GERD (gastroesophageal reflux disease)   . Hyperlipidemia   . 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 Rheumatoid arthritis ([place])   . Status post dilation of esophageal narrowing 2014  . Wears glasses     Past Surgical History:  Procedure Laterality Date  . CATARACT EXTRACTION  06/30/2016 -right eye   left eye in 08/15/16  . COLONOSCOPY    . DILATION AND CURETTAGE OF UTERUS  2009  . ESOPHAGEAL DILATION  05/31/1997  . REPAIR EXTENSOR TENDON Right 05/14/2013   Procedure: LONG FINGER EXTENSOR TO ELEVATE RING AND SMALL FINGERS ;  Surgeon: Cammie Sickle., MD;  Location: [place];  Service: Orthopedics;  Laterality: Right;  . seborrheic keratosis  09/03/12   inflamed, excised from forehead  . TENDON TRANSFER Right 09/03/2013   Procedure: RIGHT HAND FLEXOR CARPI RADIALUS TRANSFER TO LONG/RING/SMALL;  Surgeon: Cammie Sickle., MD;  Location: [place];  Service: Orthopedics;  Laterality: Right;  . TONSILLECTOMY    . TUBAL LIGATION    . ULNAR HEAD EXCISION Right 05/14/2013   Procedure: ARTHOPLASTY DISTAL  RADIAL ULNAR JOINT, RIGHT ;  Surgeon: Cammie Sickle., MD;  Location: [place];  Service: Orthopedics;  Laterality: Right;  . UPPER GASTROINTESTINAL ENDOSCOPY      There were no vitals filed for this visit.  Subjective Assessment - 05/30/17 1456    Subjective  I am able to go 36 hours between taking the medicine and doing well with that.  I am trying to be able to take it every 48 hours    Limitations  Other (comment)    Currently in Pain?  No/denies                      [facility] Adult PT Treatment/Exercise - 05/30/17 0001      Neuro Re-ed    Neuro Re-ed Details   biofeedback: resting 45 mV; during stretches as listed below 8-69m; resting tone in hooklying after stretching and breathing 125m standing and breathing in front of mirror to demonstrate ribcage movements      Lumbar Exercises: Stretches   Single Knee to Chest Stretch  Right;Left;30 seconds;3 reps    Figure 4 Stretch  2 reps;60 seconds    Other Lumbar Stretch Exercise  butterfly stretch - 2 min             PT Education - 05/30/17 1637    Education provided  Yes    Education Details  stretches: butterfly, piriformis, single knee to chest    Person(s) Educated  Patient    Methods  Explanation;Demonstration;Handout;Verbal cues    Comprehension  Verbalized understanding;Returned demonstration       PT Short Term Goals - 05/30/17 1636      PT SHORT TERM GOAL #1   Title  independent with initial HEP    Time  4    Period  Weeks    Status  Achieved      PT SHORT TERM GOAL #2   Title  able to perform diaphragmatic breath with ribcage excursion observed    Time  4    Period  Weeks    Status  Achieved      PT SHORT TERM GOAL #3   Title  reports ability to have BM without forcing using correct toileting techniques    Time  4    Period  Weeks    Status  On-going        PT Long Term Goals - 05/16/17 1759      PT LONG TERM GOAL #1   Title  ind with advanced HEP    Time  8     Period  Weeks    Status  New    Target Date  07/11/17      PT LONG TERM GOAL #2   Title  reports ability to hold urge to have BM for > or = to 15 minutes in order to get to rest room without leakage.    Time  8    Period  Weeks    Status  New    Target Date  07/11/17      PT LONG TERM GOAL #3   Title  LE 4+/5 MMT throughout for improved core and pelvic stability during functional activities    Time  8    Period  Weeks    Status  New    Target Date  07/11/17      PT LONG TERM GOAL #4   Title  able to demonstrate pelvic floor bulge with breathing due to improved muscle coordination    Time  8    Period  Weeks    Status  New    Target Date  07/11/17            Plan - 05/30/17 1631    Clinical Impression Statement  Patient has high resting tone demonstrated by biofeedback.  Was able to decrease pelvic floor resting tone from 12m down to 827mwith stretches.  Pt was educated on HEP to work on lowering resting tone and she will benefit from skilled PT to improve muscle coordination for greater bowel and bladder control.    Clinical Impairments Affecting Rehab Potential  chronic constipation and diarrhea, colitis    PT Treatment/Interventions  ADLs/Self Care Home Management;Biofeedback;Taping;Dry needling;Passive range of motion;Patient/family education;Therapeutic exercise;Balance training;Neuromuscular re-education;Therapeutic activities;Stair training;Gait training;Ultrasound;Moist Heat;Cryotherapy;Electrical Stimulation    PT Next Visit Plan  breathing and bulging pelvic floor with internal STM, hip and knee strength and mobility    Consulted and Agree with Plan of Care  Patient       Patient will benefit from skilled therapeutic intervention in order to improve the following deficits and impairments:  Postural dysfunction, Decreased strength, Decreased coordination  Visit Diagnosis: Unspecified lack of coordination  Abnormal posture  Muscle weakness  (generalized)     Problem List Patient Active Problem List   Diagnosis Date Noted  .  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 Right hip pain 02/24/2016  . Rheumatoid arthritis ([place]) 06/22/2015  . Knee pain, right 06/22/2015  . Degenerative tear of medial meniscus of left knee 11/25/2014  . Right wrist pain 04/02/2013  . Double vision 08/26/2012  . H/O long-term treatment with high-risk medication 11/08/2011  . Achilles tendon disorder 10/30/2011  . Osteoarthritis of left ankle 09/21/2011  . Tinnitus 09/11/2011  . Hip pain, left 12/14/2010  . ALLERGIC RHINITIS, CHRONIC 11/23/2008  . CONSTIPATION, CHRONIC 11/23/2008  . ACNE, ROSACEA 11/23/2008  . At risk for osteopenia 11/11/2007  . Metatarsalgia of both feet 08/27/2006  . PLANTAR FACIITIS 08/27/2006  . FOOT PAIN, BILATERAL 08/27/2006  . HYPERLIPIDEMIA 06/27/2006  . GASTROESOPHAGEAL REFLUX, NO ESOPHAGITIS 06/27/2006  . Candiss Norse SITES 06/27/2006    Zannie Cove , PT 05/30/2017, 4:40 PM  [place] [address], [place], [place], [address] Phone: [phone]   Fax:  [phone]  Name: [name] MRN: [id number] Date of Birth: [birth year]

## 2017-06-04 ENCOUNTER — Ambulatory Visit: Payer: Medicare Other | Admitting: Physical Therapy

## 2017-06-07 ENCOUNTER — Ambulatory Visit: Payer: Medicare Other | Attending: Internal Medicine | Admitting: Physical Therapy

## 2017-06-07 DIAGNOSIS — R279 Unspecified lack of coordination: Secondary | ICD-10-CM | POA: Insufficient documentation

## 2017-06-07 DIAGNOSIS — R293 Abnormal posture: Secondary | ICD-10-CM | POA: Diagnosis present

## 2017-06-07 DIAGNOSIS — M6281 Muscle weakness (generalized): Secondary | ICD-10-CM | POA: Insufficient documentation

## 2017-06-07 NOTE — Therapy (Signed)
[facility] [address] [place], [place], [address] Phone: [phone]   Fax:  [phone]  Physical Therapy Treatment  Patient Details  Name: [name] MRN: [id number] Date of Birth: [birth year] Referring Provider: Gatha Mayer, MD   Encounter Date: 06/07/2017  PT End of Session - 06/07/17 1135    Visit Number  4    Date for PT Re-Evaluation  06/27/17    PT Start Time  3491    PT Stop Time  1132    PT Time Calculation (min)  52 min    Activity Tolerance  Patient tolerated treatment well    Behavior During Therapy  [facility] for tasks assessed/performed       Past Medical History:  Diagnosis Date  . Allergy    seasonal  . Anemia   . Cataract    bil cateracts removed  . Chronic constipation   . Diarrhea    chronic diarrheafor over 2 months  . GERD (gastroesophageal reflux disease)   . Hyperlipidemia   . 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 Rheumatoid arthritis ([place])   . Status post dilation of esophageal narrowing 2014  . Wears glasses     Past Surgical History:  Procedure Laterality Date  . CATARACT EXTRACTION  06/30/2016 -right eye   left eye in 08/15/16  . COLONOSCOPY    . DILATION AND CURETTAGE OF UTERUS  2009  . ESOPHAGEAL DILATION  05/31/1997  . REPAIR EXTENSOR TENDON Right 05/14/2013   Procedure: LONG FINGER EXTENSOR TO ELEVATE RING AND SMALL FINGERS ;  Surgeon: Cammie Sickle., MD;  Location: [place];  Service: Orthopedics;  Laterality: Right;  . seborrheic keratosis  09/03/12   inflamed, excised from forehead  . TENDON TRANSFER Right 09/03/2013   Procedure: RIGHT HAND FLEXOR CARPI RADIALUS TRANSFER TO LONG/RING/SMALL;  Surgeon: Cammie Sickle., MD;  Location: [place];  Service: Orthopedics;  Laterality: Right;  . TONSILLECTOMY    . TUBAL LIGATION    . ULNAR HEAD EXCISION Right 05/14/2013   Procedure: ARTHOPLASTY DISTAL RADIAL  ULNAR JOINT, RIGHT ;  Surgeon: Cammie Sickle., MD;  Location: [place];  Service: Orthopedics;  Laterality: Right;  . UPPER GASTROINTESTINAL ENDOSCOPY      There were no vitals filed for this visit.  Subjective Assessment - 06/07/17 1105    Subjective  Pt states she couldn't get to the bathroom on time.  States the stool is liquid when this happens.      Currently in Pain?  No/denies                      [facility] Adult PT Treatment/Exercise - 06/07/17 0001      Neuro Re-ed    Neuro Re-ed Details   tactile cues for contracting pelvic floor, lower abdominals, ribcage excursion when breathing      Lumbar Exercises: Supine   Ab Set  10 reps with breathing    Bent Knee Raise  20 reps with breathing      Lumbar Exercises: Sidelying   Clam  Right;Left;20 reps;Limitations    Clam Limitations  red band and pelvic contraction    Other Sidelying Lumbar Exercises  sidelying pelvic floor 3x10 sec hold with tactile cues; 6 x 5 sec hold, 5sec rest      Manual Therapy   Manual Therapy  Myofascial release;Internal Pelvic Floor    Manual therapy  comments  pt informed and consent given to do internal soft tissue release    Myofascial Release  to thoracic diaphragm during breathing education as mentioned above    Internal Pelvic Floor  rectal soft tissue release to sphincters, puborectalis, levators - all areas assessed and no trigger points found             PT Education - 06/07/17 1134    Education provided  Yes    Education Details  breathing with towel for extension and PF endurance exercises    Person(s) Educated  Patient    Methods  Explanation;Demonstration;Verbal cues;Handout;Tactile cues    Comprehension  Verbalized understanding;Returned demonstration       PT Short Term Goals - 05/30/17 1636      PT SHORT TERM GOAL #1   Title  independent with initial HEP    Time  4    Period  Weeks    Status  Achieved      PT SHORT TERM GOAL #2    Title  able to perform diaphragmatic breath with ribcage excursion observed    Time  4    Period  Weeks    Status  Achieved      PT SHORT TERM GOAL #3   Title  reports ability to have BM without forcing using correct toileting techniques    Time  4    Period  Weeks    Status  On-going        PT Long Term Goals - 05/16/17 1759      PT LONG TERM GOAL #1   Title  ind with advanced HEP    Time  8    Period  Weeks    Status  New    Target Date  07/11/17      PT LONG TERM GOAL #2   Title  reports ability to hold urge to have BM for > or = to 15 minutes in order to get to rest room without leakage.    Time  8    Period  Weeks    Status  New    Target Date  07/11/17      PT LONG TERM GOAL #3   Title  LE 4+/5 MMT throughout for improved core and pelvic stability during functional activities    Time  8    Period  Weeks    Status  New    Target Date  07/11/17      PT LONG TERM GOAL #4   Title  able to demonstrate pelvic floor bulge with breathing due to improved muscle coordination    Time  8    Period  Weeks    Status  New    Target Date  07/11/17            Plan - 06/07/17 1135    Clinical Impression Statement  Patient had no palpable muscle spasms or trigger points.  She was able to contract and hold 10 sec x 3 with 2/5 MMT today that was a little shaky during the last rep.  she was able to perform 5 sec hold, 5 sec relax 6x.  Pt able to push finger out of the rectum.  Pt continues to have some difficutly coordinating breathing contracting on the exhale.  She was educated in being consistent with her food and fluid intake and consistent with medication usage in order to keep bowel more regular.  Pt is still having fecal urgency and incontinence and states  stool is more liquid when this happens.  Pt felt good with towel roll for thoracic extension Pt will benefit from skilled PT to continue working on thoracic extension and breathing with muscle coordination and pelvic  floor endurance.    PT Treatment/Interventions  ADLs/Self Care Home Management;Biofeedback;Taping;Dry needling;Passive range of motion;Patient/family education;Therapeutic exercise;Balance training;Neuromuscular re-education;Therapeutic activities;Stair training;Gait training;Ultrasound;Moist Heat;Cryotherapy;Electrical Stimulation    PT Next Visit Plan  biofeedback anal sphincters work on clams, endurace, thoracic ext and postureal exercises, hip and knee strength and mobility    Consulted and Agree with Plan of Care  Patient       Patient will benefit from skilled therapeutic intervention in order to improve the following deficits and impairments:  Postural dysfunction, Decreased strength, Decreased coordination  Visit Diagnosis: Unspecified lack of coordination  Abnormal posture  Muscle weakness (generalized)     Problem List Patient Active Problem List   Diagnosis Date Noted  . 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 Right hip pain 02/24/2016  . Rheumatoid arthritis ([place]) 06/22/2015  . Knee pain, right 06/22/2015  . Degenerative tear of medial meniscus of left knee 11/25/2014  . Right wrist pain 04/02/2013  . Double vision 08/26/2012  . H/O long-term treatment with high-risk medication 11/08/2011  . Achilles tendon disorder 10/30/2011  . Osteoarthritis of left ankle 09/21/2011  . Tinnitus 09/11/2011  . Hip pain, left 12/14/2010  . ALLERGIC RHINITIS, CHRONIC 11/23/2008  . CONSTIPATION, CHRONIC 11/23/2008  . ACNE, ROSACEA 11/23/2008  . At risk for osteopenia 11/11/2007  . Metatarsalgia of both feet 08/27/2006  . PLANTAR FACIITIS 08/27/2006  . FOOT PAIN, BILATERAL 08/27/2006  . HYPERLIPIDEMIA 06/27/2006  . GASTROESOPHAGEAL REFLUX, NO ESOPHAGITIS 06/27/2006  . Candiss Norse SITES 06/27/2006    Zannie Cove, PT 06/07/2017, 11:47 AM  [facility] [address], [place],  [place], [address] Phone: [phone]   Fax:  [phone]  Name: KAILANI BRASS MRN: [id number] Date of Birth: 08/10/1940

## 2017-06-07 NOTE — Patient Instructions (Signed)
   Endurance Holds:  Contract and lift, hold for __5___  seconds. Remember to breathe throughout the hold, quiet breaths. You do not need to take deep abdominal breaths during this exercise. Imagine an elevator rising to the top floor and staying there, then descending down to the lobby floor at the end of the hold.   __5__ seconds x __5__ repetitions  x __5__ sets     Do breathing with ribcage expanding on the INHALE, then pelvic floor and lower abdominals contracting on the EXHALE Lie down with towel roll under upper back; use small towel roll behind neck to support the cervical spine  Do at least breaths/ day  [facility] [address] [place], [place] [address] Phone # [phone] Fax [phone]

## 2017-06-08 ENCOUNTER — Other Ambulatory Visit: Payer: Self-pay | Admitting: Internal Medicine

## 2017-06-10 ENCOUNTER — Telehealth: Payer: Self-pay | Admitting: Internal Medicine

## 2017-06-10 MED ORDER — BUDESONIDE 3 MG PO CPEP: ORAL_CAPSULE | ORAL | 0 refills | Status: DC

## 2017-06-10 NOTE — Telephone Encounter (Signed)
I spoke with Ann again and told her as Dr Carlean Purl advised. She said not to send another refill that she will just adjust the dose as needed on the refill I sent in earlier today.

## 2017-06-10 NOTE — Telephone Encounter (Signed)
Refill budesonide  Rx should be 72m capsules take 3 each day # 90 2 refill  If she only needs 3 mg daily that is fine  If that is not control diarrhea I suggest she try going back to 9 mg qd

## 2017-06-10 NOTE — Telephone Encounter (Signed)
  I spoke with Fatema and she said it was not the Lomotil she needed its the budesonide. I had already faxed the Lomotil so she said she will just let them keep that on file. She made a follow up appointment for April.  She is having trouble with the IBS-D currently and reported that adjust her medicines to fit whats going on. She sometimes takes the budesonide daily instead of qod.

## 2017-06-11 ENCOUNTER — Encounter: Payer: Medicare Other | Admitting: Physical Therapy

## 2017-06-13 ENCOUNTER — Telehealth: Payer: Self-pay

## 2017-06-13 NOTE — Telephone Encounter (Signed)
Received a prior authorization request for patients Lomotil from covermymeds. See phone note from 06/10/17. She doesn't need the lomotil currently.  I called [facility] and notified them of this.

## 2017-06-14 ENCOUNTER — Encounter: Payer: Medicare Other | Admitting: Physical Therapy

## 2017-06-17 ENCOUNTER — Ambulatory Visit: Payer: Medicare Other | Admitting: Physical Therapy

## 2017-06-19 ENCOUNTER — Ambulatory Visit: Payer: Medicare Other | Admitting: Physical Therapy

## 2017-06-19 ENCOUNTER — Encounter: Payer: Self-pay | Admitting: Physical Therapy

## 2017-06-19 DIAGNOSIS — M6281 Muscle weakness (generalized): Secondary | ICD-10-CM

## 2017-06-19 DIAGNOSIS — R293 Abnormal posture: Secondary | ICD-10-CM

## 2017-06-19 DIAGNOSIS — R279 Unspecified lack of coordination: Secondary | ICD-10-CM | POA: Diagnosis not present

## 2017-06-19 NOTE — Patient Instructions (Addendum)
   BREATHING TECHNIQUE  Breathe in for a 4 second count and through pursed lips, breathe out for an 8 second count.      WALL PUSH UPS  Standing at a wall, place your arms out in front of you with your elbows straight so that your hands just reach the wall.  Next, bend your elbows slowly to bring your chest closer to the wall. Maintain your pelvic contraction and and lower abdominals engaged as you bring body to the wall.  Exhale and you bend your elbows and lower to the wall. Repeat 20x  [facility] [address] [place], [place] [address] Phone # [phone] Fax [phone]

## 2017-06-19 NOTE — Therapy (Signed)
[facility] [address], [place], [place], [address] Phone: [phone]   Fax:  [phone]  Physical Therapy Treatment  Patient Details  Name: [name] MRN: [id number] Date of Birth: 05/02/1940 Referring Provider: Gatha Mayer, MD   Encounter Date: 06/19/2017  PT End of Session - 06/19/17 1106    Visit Number  5    Date for PT Re-Evaluation  07/31/17    PT Start Time  0957    PT Stop Time  9702    PT Time Calculation (min)  50 min    Activity Tolerance  Patient tolerated treatment well    Behavior During Therapy  [facility] for tasks assessed/performed       Past Medical History:  Diagnosis Date  . Allergy    seasonal  . Anemia   . Cataract    bil cateracts removed  . Chronic constipation   . Diarrhea    chronic diarrheafor over 2 months  . GERD (gastroesophageal reflux disease)   . Hyperlipidemia   . 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 Rheumatoid arthritis ([place])   . Status post dilation of esophageal narrowing 2014  . Wears glasses     Past Surgical History:  Procedure Laterality Date  . CATARACT EXTRACTION  06/30/2016 -right eye   left eye in 08/15/16  . COLONOSCOPY    . DILATION AND CURETTAGE OF UTERUS  2009  . ESOPHAGEAL DILATION  05/31/1997  . REPAIR EXTENSOR TENDON Right 05/14/2013   Procedure: LONG FINGER EXTENSOR TO ELEVATE RING AND SMALL FINGERS ;  Surgeon: Cammie Sickle., MD;  Location: [place];  Service: Orthopedics;  Laterality: Right;  . seborrheic keratosis  09/03/12   inflamed, excised from forehead  . TENDON TRANSFER Right 09/03/2013   Procedure: RIGHT HAND FLEXOR CARPI RADIALUS TRANSFER TO LONG/RING/SMALL;  Surgeon: Cammie Sickle., MD;  Location: [place];  Service: Orthopedics;  Laterality: Right;  . TONSILLECTOMY    . TUBAL LIGATION    . ULNAR HEAD EXCISION Right 05/14/2013   Procedure: ARTHOPLASTY DISTAL  RADIAL ULNAR JOINT, RIGHT ;  Surgeon: Cammie Sickle., MD;  Location: [place];  Service: Orthopedics;  Laterality: Right;  . UPPER GASTROINTESTINAL ENDOSCOPY      There were no vitals filed for this visit.  Subjective Assessment - 06/19/17 1051    Subjective  Pt reports she is taking the medication and that helps enable her to do what she needs to do, but when she doesn't take it, there is leakage and diarrhea.  Pt reports she is still having difficulty with coodinating exhale on exertion.      Limitations  Other (comment)    Currently in Pain?  No/denies                              PT Education - 06/19/17 1107    Education provided  Yes    Education Details  exhale with exertion, wall push ups, pursed lip breathing    Person(s) Educated  Patient    Methods  Explanation;Demonstration;Handout;Verbal cues;Tactile cues    Comprehension  Verbalized understanding;Returned demonstration       PT Short Term Goals - 05/30/17 1636      PT SHORT TERM GOAL #1   Title  independent with initial HEP    Time  4    Period  Weeks  Status  Achieved      PT SHORT TERM GOAL #2   Title  able to perform diaphragmatic breath with ribcage excursion observed    Time  4    Period  Weeks    Status  Achieved      PT SHORT TERM GOAL #3   Title  reports ability to have BM without forcing using correct toileting techniques    Time  4    Period  Weeks    Status  On-going        PT Long Term Goals - 06/19/17 1351      PT LONG TERM GOAL #1   Title  ind with advanced HEP    Time  6    Period  Weeks    Status  On-going    Target Date  07/31/17      PT LONG TERM GOAL #2   Title  reports ability to hold urge to have BM for > or = to 15 minutes in order to get to rest room without leakage.    Time  6    Period  Weeks    Status  On-going    Target Date  07/31/17      PT LONG TERM GOAL #3   Title  LE 4+/5 MMT throughout for improved core and pelvic  stability during functional activities    Time  6    Period  Weeks    Status  On-going    Target Date  07/31/17      PT LONG TERM GOAL #4   Title  able to demonstrate pelvic floor bulge with breathing due to improved muscle coordination    Time  6    Period  Weeks    Status  On-going    Target Date  07/31/17            Plan - 06/19/17 1108    Clinical Impression Statement  Patient demonstrates improved breathing technique with greater ribcage excursion.  She has tightness in diaphragm and upper abdominals with low tone in lower abdominals.  She responded well with STM to diaphragm.  Pt needs tactile and verbal cues to engage TrA and pelvic floor during exercises.  Patient is making slow and steady progress with improved stability and posture and is expected to make further progress with pelvic floor control if she continues with biofeedback training for pelvic floor and breathe training for improved coordination with functional movements.  Pt recommended to continue skilled PT at this time.    Rehab Potential  Good    Clinical Impairments Affecting Rehab Potential  chronic constipation and diarrhea, colitis    PT Frequency  1x / week    PT Duration  6 weeks    PT Treatment/Interventions  ADLs/Self Care Home Management;Biofeedback;Taping;Dry needling;Passive range of motion;Patient/family education;Therapeutic exercise;Balance training;Neuromuscular re-education;Therapeutic activities;Stair training;Gait training;Ultrasound;Moist Heat;Cryotherapy;Electrical Stimulation    PT Next Visit Plan  biofeedback, lower abdominal and pelvic floor stabilizing with functional movement, exhale on exertion, postural strengthening, exercises sitting on pball    Consulted and Agree with Plan of Care  Patient       Patient will benefit from skilled therapeutic intervention in order to improve the following deficits and impairments:  Postural dysfunction, Decreased strength, Decreased  coordination  Visit Diagnosis: Unspecified lack of coordination  Abnormal posture  Muscle weakness (generalized)     Problem List Patient Active Problem List   Diagnosis Date Noted  . Lymphocytic colitis w/ patchy collagen deposition  09/28/2016  . Right hip pain 02/24/2016  . Rheumatoid arthritis ([place]) 06/22/2015  . Knee pain, right 06/22/2015  . Degenerative tear of medial meniscus of left knee 11/25/2014  . Right wrist pain 04/02/2013  . Double vision 08/26/2012  . H/O long-term treatment with high-risk medication 11/08/2011  . Achilles tendon disorder 10/30/2011  . Osteoarthritis of left ankle 09/21/2011  . Tinnitus 09/11/2011  . Hip pain, left 12/14/2010  . ALLERGIC RHINITIS, CHRONIC 11/23/2008  . CONSTIPATION, CHRONIC 11/23/2008  . ACNE, ROSACEA 11/23/2008  . At risk for osteopenia 11/11/2007  . Metatarsalgia of both feet 08/27/2006  . PLANTAR FACIITIS 08/27/2006  . FOOT PAIN, BILATERAL 08/27/2006  . HYPERLIPIDEMIA 06/27/2006  . GASTROESOPHAGEAL REFLUX, NO ESOPHAGITIS 06/27/2006  . Candiss Norse SITES 06/27/2006    Zannie Cove, PT 06/19/2017, 1:54 PM  [facility] [address], [place], [place], [address] Phone: [phone]   Fax:  [phone]  Name: [name] MRN: [id number] Date of Birth: 01-15-1941

## 2017-06-25 ENCOUNTER — Ambulatory Visit: Payer: Medicare Other | Admitting: Physical Therapy

## 2017-06-25 DIAGNOSIS — M6281 Muscle weakness (generalized): Secondary | ICD-10-CM

## 2017-06-25 DIAGNOSIS — R279 Unspecified lack of coordination: Secondary | ICD-10-CM | POA: Diagnosis not present

## 2017-06-25 DIAGNOSIS — R293 Abnormal posture: Secondary | ICD-10-CM

## 2017-06-25 NOTE — Patient Instructions (Signed)
   TRUNK EXTENSION - TOWEL - AROM - MOBILIZATION  While sitting in a chair, extend your thoracic spine backwards over a rolled up towel against the back rest. Repeat 10x     MEDICINE BALL BRIDGE  While lying on your back, raise your buttocks off the floor/bed while holding a medicine ball between your knees as shown.  Use towel behind back for more upper back extension  20 reps  [facility] [address] [place], [place] [address] Phone # [phone] Fax [phone]

## 2017-06-25 NOTE — Therapy (Signed)
[facility] [address], [name], [place], [address] Phone: [phone]   Fax:  [phone]  Physical Therapy Treatment  Patient Details  Name: [name] MRN: [id number] Date of Birth: [birth year] Referring Provider: Gatha Mayer, MD   Encounter Date: 06/25/2017  PT End of Session - 06/25/17 1008    Visit Number  6    Date for PT Re-Evaluation  07/31/17    PT Start Time  0930    PT Stop Time  3953    PT Time Calculation (min)  45 min    Activity Tolerance  Patient tolerated treatment well    Behavior During Therapy  [facility] for tasks assessed/performed       Past Medical History:  Diagnosis Date  . Allergy    seasonal  . Anemia   . Cataract    bil cateracts removed  . Chronic constipation   . Diarrhea    chronic diarrheafor over 2 months  . GERD (gastroesophageal reflux disease)   . Hyperlipidemia   . 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 Rheumatoid arthritis ([place])   . Status post dilation of esophageal narrowing 2014  . Wears glasses     Past Surgical History:  Procedure Laterality Date  . CATARACT EXTRACTION  06/30/2016 -right eye   left eye in 08/15/16  . COLONOSCOPY    . DILATION AND CURETTAGE OF UTERUS  2009  . ESOPHAGEAL DILATION  05/31/1997  . REPAIR EXTENSOR TENDON Right 05/14/2013   Procedure: LONG FINGER EXTENSOR TO ELEVATE RING AND SMALL FINGERS ;  Surgeon: Cammie Sickle., MD;  Location: [place];  Service: Orthopedics;  Laterality: Right;  . seborrheic keratosis  09/03/12   inflamed, excised from forehead  . TENDON TRANSFER Right 09/03/2013   Procedure: RIGHT HAND FLEXOR CARPI RADIALUS TRANSFER TO LONG/RING/SMALL;  Surgeon: Cammie Sickle., MD;  Location: [place];  Service: Orthopedics;  Laterality: Right;  . TONSILLECTOMY    . TUBAL LIGATION    . ULNAR HEAD EXCISION Right 05/14/2013   Procedure: ARTHOPLASTY DISTAL  RADIAL ULNAR JOINT, RIGHT ;  Surgeon: Cammie Sickle., MD;  Location: [place];  Service: Orthopedics;  Laterality: Right;  . UPPER GASTROINTESTINAL ENDOSCOPY      There were no vitals filed for this visit.  Subjective Assessment - 06/25/17 1512    Subjective  I am doing well with the medication and exercises to manage things right now.    Limitations  Other (comment)    Currently in Pain?  No/denies                      [facility] Adult PT Treatment/Exercise - 06/25/17 0001      Lumbar Exercises: Seated   Other Seated Lumbar Exercises  thoracic ext and rotation      Lumbar Exercises: Supine   Bent Knee Raise  20 reps with breathing    Bridge with Ball Squeeze  20 reps with towel roll behind upper back for ext      Manual Therapy   Manual Therapy  Soft tissue mobilization;Joint mobilization    Joint Mobilization  gentle AP mobs T3-6    Soft tissue mobilization  thoracic and lumbar paraspinals               PT Short Term Goals - 05/30/17 1636      PT SHORT TERM GOAL #1  Title  independent with initial HEP    Time  4    Period  Weeks    Status  Achieved      PT SHORT TERM GOAL #2   Title  able to perform diaphragmatic breath with ribcage excursion observed    Time  4    Period  Weeks    Status  Achieved      PT SHORT TERM GOAL #3   Title  reports ability to have BM without forcing using correct toileting techniques    Time  4    Period  Weeks    Status  On-going        PT Long Term Goals - 06/19/17 1351      PT LONG TERM GOAL #1   Title  ind with advanced HEP    Time  6    Period  Weeks    Status  On-going    Target Date  07/31/17      PT LONG TERM GOAL #2   Title  reports ability to hold urge to have BM for > or = to 15 minutes in order to get to rest room without leakage.    Time  6    Period  Weeks    Status  On-going    Target Date  07/31/17      PT LONG TERM GOAL #3   Title  LE 4+/5 MMT throughout for  improved core and pelvic stability during functional activities    Time  6    Period  Weeks    Status  On-going    Target Date  07/31/17      PT LONG TERM GOAL #4   Title  able to demonstrate pelvic floor bulge with breathing due to improved muscle coordination    Time  6    Period  Weeks    Status  On-going    Target Date  07/31/17            Plan - 06/25/17 1008    Clinical Impression Statement  Patient did well with improved ability to demonstrate coordination with breathing and core/pelvic floor engagement.  Pt got more thoracic extension after manual therapy today and was able to perform exercise as shown in HEP.  Pt continues to benefit from skilled PT for improved posture and muscle coordination    PT Treatment/Interventions  ADLs/Self Care Home Management;Biofeedback;Taping;Dry needling;Passive range of motion;Patient/family education;Therapeutic exercise;Balance training;Neuromuscular re-education;Therapeutic activities;Stair training;Gait training;Ultrasound;Moist Heat;Cryotherapy;Electrical Stimulation    PT Next Visit Plan  biofeedback, lower abdominal and pelvic floor stabilizing with functional movement, exhale on exertion, postural strengthening, exercises sitting on pball    Consulted and Agree with Plan of Care  Patient       Patient will benefit from skilled therapeutic intervention in order to improve the following deficits and impairments:  Postural dysfunction, Decreased strength, Decreased coordination  Visit Diagnosis: Unspecified lack of coordination  Abnormal posture  Muscle weakness (generalized)     Problem List Patient Active Problem List   Diagnosis Date Noted  . 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 Right hip pain 02/24/2016  . Rheumatoid arthritis ([place]) 06/22/2015  . Knee pain, right 06/22/2015  . Degenerative tear of medial meniscus of left knee 11/25/2014  . Right wrist pain 04/02/2013  . Double vision  08/26/2012  . H/O long-term treatment with high-risk medication 11/08/2011  . Achilles tendon disorder 10/30/2011  . Osteoarthritis of left ankle 09/21/2011  . Tinnitus 09/11/2011  .  Hip pain, left 12/14/2010  . ALLERGIC RHINITIS, CHRONIC 11/23/2008  . CONSTIPATION, CHRONIC 11/23/2008  . ACNE, ROSACEA 11/23/2008  . At risk for osteopenia 11/11/2007  . Metatarsalgia of both feet 08/27/2006  . PLANTAR FACIITIS 08/27/2006  . FOOT PAIN, BILATERAL 08/27/2006  . HYPERLIPIDEMIA 06/27/2006  . GASTROESOPHAGEAL REFLUX, NO ESOPHAGITIS 06/27/2006  . Candiss Norse SITES 06/27/2006    Zannie Cove, PT 06/25/2017, 3:13 PM  [facility] [address], [place], [place], [address] Phone: [phone]   Fax:  [phone]  Name: MALACHI SUDERMAN MRN: [id number] Date of Birth: 01-29-1941

## 2017-07-02 ENCOUNTER — Encounter: Payer: Self-pay | Admitting: Physical Therapy

## 2017-07-02 ENCOUNTER — Ambulatory Visit: Payer: Medicare Other | Attending: Internal Medicine | Admitting: Physical Therapy

## 2017-07-02 DIAGNOSIS — R279 Unspecified lack of coordination: Secondary | ICD-10-CM | POA: Diagnosis not present

## 2017-07-02 DIAGNOSIS — M6281 Muscle weakness (generalized): Secondary | ICD-10-CM | POA: Diagnosis present

## 2017-07-02 DIAGNOSIS — R293 Abnormal posture: Secondary | ICD-10-CM | POA: Diagnosis present

## 2017-07-02 NOTE — Patient Instructions (Addendum)
   Standing with towel rolled up behind you running across the shoulder blades horizontally.  Extend shoulders and head towards the wall and make a W with arms as shown above.  Hold 5 sec while breathing in and out through your nose.  Repeat  2 sets of 10x  [facility] [address] [place], [place] [address] Phone # [phone] Fax [phone]

## 2017-07-02 NOTE — Therapy (Signed)
[facility] [address] [place], [place], [address] Phone: [phone]   Fax:  [phone]  Physical Therapy Treatment  Patient Details  Name: [name] MRN: [id number] Date of Birth: [birth year] Referring Provider: Gatha Mayer, MD   Encounter Date: 07/02/2017  PT End of Session - 07/02/17 1020    Visit Number  7    Date for PT Re-Evaluation  07/31/17    PT Start Time  1018    PT Stop Time  1102    PT Time Calculation (min)  44 min    Activity Tolerance  Patient tolerated treatment well    Behavior During Therapy  [facility] for tasks assessed/performed       Past Medical History:  Diagnosis Date  . Allergy    seasonal  . Anemia   . Cataract    bil cateracts removed  . Chronic constipation   . Diarrhea    chronic diarrheafor over 2 months  . GERD (gastroesophageal reflux disease)   . Hyperlipidemia   . 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 Rheumatoid arthritis ([place])   . Status post dilation of esophageal narrowing 2014  . Wears glasses     Past Surgical History:  Procedure Laterality Date  . CATARACT EXTRACTION  06/30/2016 -right eye   left eye in 08/15/16  . COLONOSCOPY    . DILATION AND CURETTAGE OF UTERUS  2009  . ESOPHAGEAL DILATION  05/31/1997  . REPAIR EXTENSOR TENDON Right 05/14/2013   Procedure: LONG FINGER EXTENSOR TO ELEVATE RING AND SMALL FINGERS ;  Surgeon: Cammie Sickle., MD;  Location: [place];  Service: Orthopedics;  Laterality: Right;  . seborrheic keratosis  09/03/12   inflamed, excised from forehead  . TENDON TRANSFER Right 09/03/2013   Procedure: RIGHT HAND FLEXOR CARPI RADIALUS TRANSFER TO LONG/RING/SMALL;  Surgeon: Cammie Sickle., MD;  Location: [place];  Service: Orthopedics;  Laterality: Right;  . TONSILLECTOMY    . TUBAL LIGATION    . ULNAR HEAD EXCISION Right 05/14/2013   Procedure: ARTHOPLASTY DISTAL RADIAL  ULNAR JOINT, RIGHT ;  Surgeon: Cammie Sickle., MD;  Location: [place];  Service: Orthopedics;  Laterality: Right;  . UPPER GASTROINTESTINAL ENDOSCOPY      There were no vitals filed for this visit.  Subjective Assessment - 07/02/17 1021    Subjective  Sunday and Monday I had some episodes.  I took medication and it was better.  Pt reports she is starting to get the extension more    Currently in Pain?  No/denies                      [facility] Adult PT Treatment/Exercise - 07/02/17 0001      Neuro Re-ed    Neuro Re-ed Details   breathing and posture cues throughout      Lumbar Exercises: Standing   Other Standing Lumbar Exercises  thoracic extension and W's with towel roll placed horizontally across shoulders      Lumbar Exercises: Seated   Other Seated Lumbar Exercises  breathing and bulge and contract with tactile feedback      Lumbar Exercises: Supine   Ab Set  20 reps with knee drop out    Bent Knee Raise  20 reps with breathing    Bridge with Ball Squeeze  20 reps with towel roll behind upper back for ext  Manual Therapy   Joint Mobilization  gentle AP mobs T3-6    Soft tissue mobilization  thoracic (left) and lumbar (right) paraspinals               PT Short Term Goals - 05/30/17 1636      PT SHORT TERM GOAL #1   Title  independent with initial HEP    Time  4    Period  Weeks    Status  Achieved      PT SHORT TERM GOAL #2   Title  able to perform diaphragmatic breath with ribcage excursion observed    Time  4    Period  Weeks    Status  Achieved      PT SHORT TERM GOAL #3   Title  reports ability to have BM without forcing using correct toileting techniques    Time  4    Period  Weeks    Status  On-going        PT Long Term Goals - 06/19/17 1351      PT LONG TERM GOAL #1   Title  ind with advanced HEP    Time  6    Period  Weeks    Status  On-going    Target Date  07/31/17      PT LONG TERM GOAL #2    Title  reports ability to hold urge to have BM for > or = to 15 minutes in order to get to rest room without leakage.    Time  6    Period  Weeks    Status  On-going    Target Date  07/31/17      PT LONG TERM GOAL #3   Title  LE 4+/5 MMT throughout for improved core and pelvic stability during functional activities    Time  6    Period  Weeks    Status  On-going    Target Date  07/31/17      PT LONG TERM GOAL #4   Title  able to demonstrate pelvic floor bulge with breathing due to improved muscle coordination    Time  6    Period  Weeks    Status  On-going    Target Date  07/31/17            Plan - 07/02/17 1311    Clinical Impression Statement  Patient demonstrates improved thoracic mobility and was able to become aware of greater thoracic extension with towel roll used during standing and supine exercises.  She continues to need cues to prevent rapid chest breathing pattern.  Pt will benefit from skilled PT to continue working on core and postural strength with breathing coordination    PT Treatment/Interventions  ADLs/Self Care Home Management;Biofeedback;Taping;Dry needling;Passive range of motion;Patient/family education;Therapeutic exercise;Balance training;Neuromuscular re-education;Therapeutic activities;Stair training;Gait training;Ultrasound;Moist Heat;Cryotherapy;Electrical Stimulation    PT Next Visit Plan  thoracic extension, core strength, W's agains wall, breathing    Consulted and Agree with Plan of Care  Patient       Patient will benefit from skilled therapeutic intervention in order to improve the following deficits and impairments:  Postural dysfunction, Decreased strength, Decreased coordination  Visit Diagnosis: Unspecified lack of coordination  Abnormal posture  Muscle weakness (generalized)     Problem List Patient Active Problem List   Diagnosis Date Noted  . 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 Right hip pain  02/24/2016  . Rheumatoid arthritis ([place]) 06/22/2015  . Knee  pain, right 06/22/2015  . Degenerative tear of medial meniscus of left knee 11/25/2014  . Right wrist pain 04/02/2013  . Double vision 08/26/2012  . H/O long-term treatment with high-risk medication 11/08/2011  . Achilles tendon disorder 10/30/2011  . Osteoarthritis of left ankle 09/21/2011  . Tinnitus 09/11/2011  . Hip pain, left 12/14/2010  . ALLERGIC RHINITIS, CHRONIC 11/23/2008  . CONSTIPATION, CHRONIC 11/23/2008  . ACNE, ROSACEA 11/23/2008  . At risk for osteopenia 11/11/2007  . Metatarsalgia of both feet 08/27/2006  . PLANTAR FACIITIS 08/27/2006  . FOOT PAIN, BILATERAL 08/27/2006  . HYPERLIPIDEMIA 06/27/2006  . GASTROESOPHAGEAL REFLUX, NO ESOPHAGITIS 06/27/2006  . Candiss Norse SITES 06/27/2006    Zannie Cove, PT 07/02/2017, 1:57 PM  [facility] [address] [place], [place], [address] Phone: [phone]   Fax:  [phone]  Name: [name] MRN: [id number] Date of Birth: June 28, 1940

## 2017-07-03 ENCOUNTER — Telehealth: Payer: Self-pay | Admitting: Internal Medicine

## 2017-07-03 MED ORDER — BUDESONIDE 3 MG PO CPEP: ORAL_CAPSULE | ORAL | 2 refills | Status: DC

## 2017-07-03 NOTE — Telephone Encounter (Signed)
Refilled the budesonide as requested. See the last phone note, Dr Carlean Purl had stated she could adjust the dosage.

## 2017-07-03 NOTE — Telephone Encounter (Signed)
Patient states she tried a lower dose of medication budesonide, but she would no like to up the dose instead. Pt requesting new prescription of medication for 2-3 tablets a day be called in. Pt has appt scheduled.

## 2017-07-09 ENCOUNTER — Ambulatory Visit: Payer: Medicare Other | Admitting: Physical Therapy

## 2017-07-09 DIAGNOSIS — R279 Unspecified lack of coordination: Secondary | ICD-10-CM | POA: Diagnosis not present

## 2017-07-09 DIAGNOSIS — M6281 Muscle weakness (generalized): Secondary | ICD-10-CM

## 2017-07-09 DIAGNOSIS — R293 Abnormal posture: Secondary | ICD-10-CM

## 2017-07-09 NOTE — Therapy (Signed)
[facility] [address] [place], [place], [address] Phone: [phone]   Fax:  [phone]  Physical Therapy Treatment  Patient Details  Name: [name] MRN: [id number] Date of Birth: [birth year] Referring Provider: Gatha Mayer, MD   Encounter Date: 07/09/2017  PT End of Session - 07/09/17 0948    Visit Number  8    Date for PT Re-Evaluation  07/31/17    PT Start Time  0930    PT Stop Time  1012    PT Time Calculation (min)  42 min    Activity Tolerance  Patient tolerated treatment well    Behavior During Therapy  [facility] for tasks assessed/performed       Past Medical History:  Diagnosis Date  . Allergy    seasonal  . Anemia   . Cataract    bil cateracts removed  . Chronic constipation   . Diarrhea    chronic diarrheafor over 2 months  . GERD (gastroesophageal reflux disease)   . Hyperlipidemia   . 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 Rheumatoid arthritis ([place])   . Status post dilation of esophageal narrowing 2014  . Wears glasses     Past Surgical History:  Procedure Laterality Date  . CATARACT EXTRACTION  06/30/2016 -right eye   left eye in 08/15/16  . COLONOSCOPY    . DILATION AND CURETTAGE OF UTERUS  2009  . ESOPHAGEAL DILATION  05/31/1997  . REPAIR EXTENSOR TENDON Right 05/14/2013   Procedure: LONG FINGER EXTENSOR TO ELEVATE RING AND SMALL FINGERS ;  Surgeon: Cammie Sickle., MD;  Location: [place];  Service: Orthopedics;  Laterality: Right;  . seborrheic keratosis  09/03/12   inflamed, excised from forehead  . TENDON TRANSFER Right 09/03/2013   Procedure: RIGHT HAND FLEXOR CARPI RADIALUS TRANSFER TO LONG/RING/SMALL;  Surgeon: Cammie Sickle., MD;  Location: [place];  Service: Orthopedics;  Laterality: Right;  . TONSILLECTOMY    . TUBAL LIGATION    . ULNAR HEAD EXCISION Right 05/14/2013   Procedure: ARTHOPLASTY DISTAL  RADIAL ULNAR JOINT, RIGHT ;  Surgeon: Cammie Sickle., MD;  Location: [place];  Service: Orthopedics;  Laterality: Right;  . UPPER GASTROINTESTINAL ENDOSCOPY      There were no vitals filed for this visit.  Subjective Assessment - 07/09/17 0940    Subjective  Pt reports she is doing well.  She states she has been working on incorporating the exercises in her HEP with her trainer.      Currently in Pain?  No/denies                      [facility] Adult PT Treatment/Exercise - 07/09/17 0001      Self-Care   Self-Care  Other Self-Care Comments    Other Self-Care Comments   review toileting techniques; practiced breathing with feet on stool in correct position for BM      Neuro Re-ed    Neuro Re-ed Details   cues for breathing and engaging core throuhout, posutre and breathing with increased ribcage and thoracic extension      Lumbar Exercises: Prone   Straight Leg Raise  15 reps             PT Education - 07/09/17 1014    Education provided  Yes    Education Details  prone hip ext    Person(s) Educated  Patient    Methods  Explanation;Demonstration;Handout;Verbal cues;Tactile cues    Comprehension  Verbalized understanding;Returned demonstration       PT Short Term Goals - 07/09/17 1001      PT SHORT TERM GOAL #3   Title  reports ability to have BM without forcing using correct toileting techniques    Time  4    Period  Weeks    Status  Achieved        PT Long Term Goals - 06/19/17 1351      PT LONG TERM GOAL #1   Title  ind with advanced HEP    Time  6    Period  Weeks    Status  On-going    Target Date  07/31/17      PT LONG TERM GOAL #2   Title  reports ability to hold urge to have BM for > or = to 15 minutes in order to get to rest room without leakage.    Time  6    Period  Weeks    Status  On-going    Target Date  07/31/17      PT LONG TERM GOAL #3   Title  LE 4+/5 MMT throughout for improved core and pelvic  stability during functional activities    Time  6    Period  Weeks    Status  On-going    Target Date  07/31/17      PT LONG TERM GOAL #4   Title  able to demonstrate pelvic floor bulge with breathing due to improved muscle coordination    Time  6    Period  Weeks    Status  On-going    Target Date  07/31/17            Plan - 07/09/17 0948    Clinical Impression Statement  Pt was able to demonstrate technique for BM without bearing down.  Pt needed verbal and tactile cues for improved breathing techniqe and she continues to have difficulty feeling pelvic floor muscles bulging with inhale.  Pt understnads how to perform and the necessity of continuing to practice at home.  She has demonstrated improved hip strength.  Pt able to do bridge with alt LE lift about 2 reps before fatigue in right LE causing hip drop.  Pt continues to benefit from skilled PT to work on muscle coordination for improved core and pelvic floor stability     Clinical Impairments Affecting Rehab Potential  chronic constipation and diarrhea, colitis    PT Treatment/Interventions  ADLs/Self Care Home Management;Biofeedback;Taping;Dry needling;Passive range of motion;Patient/family education;Therapeutic exercise;Balance training;Neuromuscular re-education;Therapeutic activities;Stair training;Gait training;Ultrasound;Moist Heat;Cryotherapy;Electrical Stimulation    PT Next Visit Plan  thoracic extension, bridge with marching, hip strength, core strength, W's agains wall, breathing    Consulted and Agree with Plan of Care  Patient       Patient will benefit from skilled therapeutic intervention in order to improve the following deficits and impairments:  Postural dysfunction, Decreased strength, Decreased coordination  Visit Diagnosis: Unspecified lack of coordination  Abnormal posture  Muscle weakness (generalized)     Problem List Patient Active Problem List   Diagnosis Date Noted  . Lymphocytic colitis  w/ patchy collagen deposition 09/28/2016  . Right hip pain 02/24/2016  . Rheumatoid arthritis ([place]) 06/22/2015  . Knee pain, right 06/22/2015  . Degenerative tear of medial meniscus of left knee 11/25/2014  . Right wrist pain 04/02/2013  . Double vision 08/26/2012  . H/O  long-term treatment with high-risk medication 11/08/2011  . Achilles tendon disorder 10/30/2011  . Osteoarthritis of left ankle 09/21/2011  . Tinnitus 09/11/2011  . Hip pain, left 12/14/2010  . ALLERGIC RHINITIS, CHRONIC 11/23/2008  . CONSTIPATION, CHRONIC 11/23/2008  . ACNE, ROSACEA 11/23/2008  . At risk for osteopenia 11/11/2007  . Metatarsalgia of both feet 08/27/2006  . PLANTAR FACIITIS 08/27/2006  . FOOT PAIN, BILATERAL 08/27/2006  . HYPERLIPIDEMIA 06/27/2006  . GASTROESOPHAGEAL REFLUX, NO ESOPHAGITIS 06/27/2006  . Candiss Norse SITES 06/27/2006    Zannie Cove, PT 07/09/2017, 10:25 AM  [facility] [address] [place], [place], [address] Phone: [phone]   Fax:  [phone]  Name: [name] MRN: [id number] Date of Birth: [birth year]

## 2017-07-09 NOTE — Patient Instructions (Signed)
   Keep both hips pressed into the mat and only lift leg as high as you can without rotating low back.  Keep abdominals engaged Do 2 sets of 10 on each side  [facility] [address], [place], [place] [address] Phone # [phone] Fax [phone]

## 2017-07-16 ENCOUNTER — Ambulatory Visit: Payer: Medicare Other | Admitting: Physical Therapy

## 2017-07-16 DIAGNOSIS — R279 Unspecified lack of coordination: Secondary | ICD-10-CM

## 2017-07-16 DIAGNOSIS — R293 Abnormal posture: Secondary | ICD-10-CM

## 2017-07-16 DIAGNOSIS — M6281 Muscle weakness (generalized): Secondary | ICD-10-CM

## 2017-07-16 NOTE — Therapy (Signed)
[facility] [address] [place], [place], [address] Phone: [phone]   Fax:  [phone]  Physical Therapy Treatment  Patient Details  Name: [name] MRN: [id number] Date of Birth: 1941-03-02 Referring Provider: Gatha Mayer, MD   Encounter Date: 07/16/2017  PT End of Session - 07/16/17 1112    Visit Number  9    Date for PT Re-Evaluation  07/31/17    PT Start Time  1102    PT Stop Time  1142    PT Time Calculation (min)  40 min    Activity Tolerance  Patient tolerated treatment well    Behavior During Therapy  [facility] for tasks assessed/performed       Past Medical History:  Diagnosis Date  . Allergy    seasonal  . Anemia   . Cataract    bil cateracts removed  . Chronic constipation   . Diarrhea    chronic diarrheafor over 2 months  . GERD (gastroesophageal reflux disease)   . Hyperlipidemia   . 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 Rheumatoid arthritis ([place])   . Status post dilation of esophageal narrowing 2014  . Wears glasses     Past Surgical History:  Procedure Laterality Date  . CATARACT EXTRACTION  06/30/2016 -right eye   left eye in 08/15/16  . COLONOSCOPY    . DILATION AND CURETTAGE OF UTERUS  2009  . ESOPHAGEAL DILATION  05/31/1997  . REPAIR EXTENSOR TENDON Right 05/14/2013   Procedure: LONG FINGER EXTENSOR TO ELEVATE RING AND SMALL FINGERS ;  Surgeon: Cammie Sickle., MD;  Location: [place];  Service: Orthopedics;  Laterality: Right;  . seborrheic keratosis  09/03/12   inflamed, excised from forehead  . TENDON TRANSFER Right 09/03/2013   Procedure: RIGHT HAND FLEXOR CARPI RADIALUS TRANSFER TO LONG/RING/SMALL;  Surgeon: Cammie Sickle., MD;  Location: [place];  Service: Orthopedics;  Laterality: Right;  . TONSILLECTOMY    . TUBAL LIGATION    . ULNAR HEAD EXCISION Right 05/14/2013   Procedure: ARTHOPLASTY DISTAL  RADIAL ULNAR JOINT, RIGHT ;  Surgeon: Cammie Sickle., MD;  Location: [place];  Service: Orthopedics;  Laterality: Right;  . UPPER GASTROINTESTINAL ENDOSCOPY      There were no vitals filed for this visit.  Subjective Assessment - 07/16/17 1105    Subjective  Pt states she noticed after doing the exercises, she will have a bowel movement in the morning.  Pt states she is doing well.     Currently in Pain?  No/denies                      [facility] Adult PT Treatment/Exercise - 07/16/17 0001      Lumbar Exercises: Aerobic   UBE (Upper Arm Bike)  L1 3x3      Lumbar Exercises: Standing   Other Standing Lumbar Exercises  thoracic extesion and scap squeeze with foam roll       Lumbar Exercises: Supine   Bent Knee Raise  20 reps with breathing    Bridge  20 reps    Large Ball Abdominal Isometric  20 reps      Manual Therapy   Joint Mobilization  gentle AP mobs T3-6    Soft tissue mobilization  thoracic and lumbar paraspinals               PT Short Term Goals -  07/09/17 1001      PT SHORT TERM GOAL #3   Title  reports ability to have BM without forcing using correct toileting techniques    Time  4    Period  Weeks    Status  Achieved        PT Long Term Goals - 07/16/17 1232      PT LONG TERM GOAL #1   Title  ind with advanced HEP    Time  6    Period  Weeks    Status  On-going      PT LONG TERM GOAL #2   Title  reports ability to hold urge to have BM for > or = to 15 minutes in order to get to rest room without leakage.    Baseline  improved, able to manage with medication and have improved BM    Time  6    Period  Weeks    Status  On-going      PT LONG TERM GOAL #3   Title  LE 4+/5 MMT throughout for improved core and pelvic stability during functional activities    Time  6    Period  Weeks    Status  On-going      PT LONG TERM GOAL #4   Title  able to demonstrate pelvic floor bulge with breathing due to improved muscle  coordination    Time  6    Period  Weeks    Status  On-going            Plan - 07/16/17 1143    Clinical Impression Statement  Patient appears to have improved mobility of thoracic spine.  She has muscle spasms along paraspinals that are improved with manual techniques.  Pt is albe to perform exercises with less cues for breathing correctly.  She will continue to benefit from skilled PT to work on improved posture and core strength    Clinical Impairments Affecting Rehab Potential  chronic constipation and diarrhea, colitis    PT Treatment/Interventions  ADLs/Self Care Home Management;Biofeedback;Taping;Dry needling;Passive range of motion;Patient/family education;Therapeutic exercise;Balance training;Neuromuscular re-education;Therapeutic activities;Stair training;Gait training;Ultrasound;Moist Heat;Cryotherapy;Electrical Stimulation    PT Next Visit Plan  thoracic extension, hip strength, core strength, W's agains wall, breathing, UBE    Consulted and Agree with Plan of Care  Patient       Patient will benefit from skilled therapeutic intervention in order to improve the following deficits and impairments:  Postural dysfunction, Decreased strength, Decreased coordination  Visit Diagnosis: Unspecified lack of coordination  Abnormal posture  Muscle weakness (generalized)     Problem List Patient Active Problem List   Diagnosis Date Noted  . 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 Right hip pain 02/24/2016  . Rheumatoid arthritis ([place]) 06/22/2015  . Knee pain, right 06/22/2015  . Degenerative tear of medial meniscus of left knee 11/25/2014  . Right wrist pain 04/02/2013  . Double vision 08/26/2012  . H/O long-term treatment with high-risk medication 11/08/2011  . Achilles tendon disorder 10/30/2011  . Osteoarthritis of left ankle 09/21/2011  . Tinnitus 09/11/2011  . Hip pain, left 12/14/2010  . ALLERGIC RHINITIS, CHRONIC 11/23/2008  .  CONSTIPATION, CHRONIC 11/23/2008  . ACNE, ROSACEA 11/23/2008  . At risk for osteopenia 11/11/2007  . Metatarsalgia of both feet 08/27/2006  . PLANTAR FACIITIS 08/27/2006  . FOOT PAIN, BILATERAL 08/27/2006  . HYPERLIPIDEMIA 06/27/2006  . GASTROESOPHAGEAL REFLUX, NO ESOPHAGITIS 06/27/2006  . OSTEOARTHRITIS, MULTI SITES 06/27/2006  Zannie Cove, PT 07/16/2017, 1:20 PM  [facility] [address] [place], [place], [address] Phone: [phone]   Fax:  [phone]  Name: [name] MRN: [id number] Date of Birth: 01/29/1941

## 2017-07-24 ENCOUNTER — Ambulatory Visit: Payer: Medicare Other | Admitting: Physical Therapy

## 2017-07-24 DIAGNOSIS — R293 Abnormal posture: Secondary | ICD-10-CM

## 2017-07-24 DIAGNOSIS — M6281 Muscle weakness (generalized): Secondary | ICD-10-CM

## 2017-07-24 DIAGNOSIS — R279 Unspecified lack of coordination: Secondary | ICD-10-CM | POA: Diagnosis not present

## 2017-07-24 NOTE — Therapy (Signed)
[facility] [address] [place], [place], [address] Phone: [phone]   Fax:  [phone]  Physical Therapy Treatment  Patient Details  Name: [name] MRN: [id number] Date of Birth: February 03, 1941 Referring Provider: Gatha Mayer, MD   Encounter Date: 07/24/2017  PT End of Session - 07/24/17 1114    Visit Number  10    Date for PT Re-Evaluation  07/31/17    PT Start Time  1102    PT Stop Time  1142    PT Time Calculation (min)  40 min    Activity Tolerance  Patient tolerated treatment well    Behavior During Therapy  [facility] for tasks assessed/performed       Past Medical History:  Diagnosis Date  . Allergy    seasonal  . Anemia   . Cataract    bil cateracts removed  . Chronic constipation   . Diarrhea    chronic diarrheafor over 2 months  . GERD (gastroesophageal reflux disease)   . Hyperlipidemia   . 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 Rheumatoid arthritis ([place])   . Status post dilation of esophageal narrowing 2014  . Wears glasses     Past Surgical History:  Procedure Laterality Date  . CATARACT EXTRACTION  06/30/2016 -right eye   left eye in 08/15/16  . COLONOSCOPY    . DILATION AND CURETTAGE OF UTERUS  2009  . ESOPHAGEAL DILATION  05/31/1997  . REPAIR EXTENSOR TENDON Right 05/14/2013   Procedure: LONG FINGER EXTENSOR TO ELEVATE RING AND SMALL FINGERS ;  Surgeon: Cammie Sickle., MD;  Location: [place];  Service: Orthopedics;  Laterality: Right;  . seborrheic keratosis  09/03/12   inflamed, excised from forehead  . TENDON TRANSFER Right 09/03/2013   Procedure: RIGHT HAND FLEXOR CARPI RADIALUS TRANSFER TO LONG/RING/SMALL;  Surgeon: Cammie Sickle., MD;  Location: [place];  Service: Orthopedics;  Laterality: Right;  . TONSILLECTOMY    . TUBAL LIGATION    . ULNAR HEAD EXCISION Right 05/14/2013   Procedure: ARTHOPLASTY DISTAL  RADIAL ULNAR JOINT, RIGHT ;  Surgeon: Cammie Sickle., MD;  Location: [place];  Service: Orthopedics;  Laterality: Right;  . UPPER GASTROINTESTINAL ENDOSCOPY      There were no vitals filed for this visit.  Subjective Assessment - 07/24/17 1104    Subjective  Pt states she is doing well with the exercises.  She feels like she is taking medication more regularly and less.  States she has been doing workout in the morning.      Currently in Pain?  No/denies                No data recorded       [facility] Adult PT Treatment/Exercise - 07/24/17 0001      Self-Care   Self-Care  Posture    Posture  standing in front of mirror to work on posture awareness and alignment of thoracic and pelvic diaphragms      Lumbar Exercises: Aerobic   UBE (Upper Arm Bike)  L3; 3x3      Lumbar Exercises: Standing   Other Standing Lumbar Exercises  thoracic extesion and scap squeeze "W" towel roll behind back for extension - 15x      Shoulder Exercises: Standing   Extension  Strengthening;Both;20 reps;Theraband    Theraband Level (Shoulder Extension)  Level 3 (Green) with thoracic ext  Manual Therapy   Joint Mobilization  gentle AP mobs T3-6    Soft tissue mobilization  thoracic and lumbar paraspinals               PT Short Term Goals - 07/09/17 1001      PT SHORT TERM GOAL #3   Title  reports ability to have BM without forcing using correct toileting techniques    Time  4    Period  Weeks    Status  Achieved        PT Long Term Goals - 07/16/17 1232      PT LONG TERM GOAL #1   Title  ind with advanced HEP    Time  6    Period  Weeks    Status  On-going      PT LONG TERM GOAL #2   Title  reports ability to hold urge to have BM for > or = to 15 minutes in order to get to rest room without leakage.    Baseline  improved, able to manage with medication and have improved BM    Time  6    Period  Weeks    Status  On-going      PT LONG TERM  GOAL #3   Title  LE 4+/5 MMT throughout for improved core and pelvic stability during functional activities    Time  6    Period  Weeks    Status  On-going      PT LONG TERM GOAL #4   Title  able to demonstrate pelvic floor bulge with breathing due to improved muscle coordination    Time  6    Period  Weeks    Status  On-going            Plan - 07/24/17 1146    Clinical Impression Statement  Patient is doing much better with thoracic extension. She has tightness in lumbar paraspinals and responds well to STM.  Pt will benefit from one more visit to ensure successful transition to HEP.    Clinical Impairments Affecting Rehab Potential  chronic constipation and diarrhea, colitis    PT Treatment/Interventions  ADLs/Self Care Home Management;Biofeedback;Taping;Dry needling;Passive range of motion;Patient/family education;Therapeutic exercise;Balance training;Neuromuscular re-education;Therapeutic activities;Stair training;Gait training;Ultrasound;Moist Heat;Cryotherapy;Electrical Stimulation    PT Next Visit Plan  posture, HEP review, re-eval fod discharge    Consulted and Agree with Plan of Care  Patient       Patient will benefit from skilled therapeutic intervention in order to improve the following deficits and impairments:  Postural dysfunction, Decreased strength, Decreased coordination  Visit Diagnosis: Unspecified lack of coordination  Abnormal posture  Muscle weakness (generalized)     Problem List Patient Active Problem List   Diagnosis Date Noted  . 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 Right hip pain 02/24/2016  . Rheumatoid arthritis ([place]) 06/22/2015  . Knee pain, right 06/22/2015  . Degenerative tear of medial meniscus of left knee 11/25/2014  . Right wrist pain 04/02/2013  . Double vision 08/26/2012  . H/O long-term treatment with high-risk medication 11/08/2011  . Achilles tendon disorder 10/30/2011  . Osteoarthritis of left  ankle 09/21/2011  . Tinnitus 09/11/2011  . Hip pain, left 12/14/2010  . ALLERGIC RHINITIS, CHRONIC 11/23/2008  . CONSTIPATION, CHRONIC 11/23/2008  . ACNE, ROSACEA 11/23/2008  . At risk for osteopenia 11/11/2007  . Metatarsalgia of both feet 08/27/2006  . PLANTAR FACIITIS 08/27/2006  . FOOT PAIN, BILATERAL 08/27/2006  . HYPERLIPIDEMIA  06/27/2006  . GASTROESOPHAGEAL REFLUX, NO ESOPHAGITIS 06/27/2006  . Candiss Norse SITES 06/27/2006    Zannie Cove, PT 07/24/2017, 11:48 AM  [facility] [address] [place], [place], [address] Phone: [phone]   Fax:  [phone]  Name: [name] MRN: [id number] Date of Birth: 12/05/1940

## 2017-07-31 ENCOUNTER — Encounter: Payer: Self-pay | Admitting: Physical Therapy

## 2017-07-31 ENCOUNTER — Ambulatory Visit: Payer: Medicare Other | Attending: Internal Medicine | Admitting: Physical Therapy

## 2017-07-31 DIAGNOSIS — R279 Unspecified lack of coordination: Secondary | ICD-10-CM | POA: Insufficient documentation

## 2017-07-31 DIAGNOSIS — M6281 Muscle weakness (generalized): Secondary | ICD-10-CM | POA: Insufficient documentation

## 2017-07-31 DIAGNOSIS — R293 Abnormal posture: Secondary | ICD-10-CM | POA: Diagnosis present

## 2017-07-31 NOTE — Therapy (Signed)
[facility] [address] [place], [place], [address] Phone: [phone]   Fax:  [phone]  Physical Therapy Treatment  Patient Details  Name: [name] MRN: [id number] Date of Birth: August 15, 1940 Referring Provider: Gatha Mayer, MD   Encounter Date: 07/31/2017  PT End of Session - 07/31/17 1104    Visit Number  11    Date for PT Re-Evaluation  07/31/17    PT Start Time  1102    PT Stop Time  1139    PT Time Calculation (min)  37 min    Activity Tolerance  Patient tolerated treatment well    Behavior During Therapy  [facility] for tasks assessed/performed       Past Medical History:  Diagnosis Date  . Allergy    seasonal  . Anemia   . Cataract    bil cateracts removed  . Chronic constipation   . Diarrhea    chronic diarrheafor over 2 months  . GERD (gastroesophageal reflux disease)   . Hyperlipidemia   . 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 Rheumatoid arthritis ([place])   . Status post dilation of esophageal narrowing 2014  . Wears glasses     Past Surgical History:  Procedure Laterality Date  . CATARACT EXTRACTION  06/30/2016 -right eye   left eye in 08/15/16  . COLONOSCOPY    . DILATION AND CURETTAGE OF UTERUS  2009  . ESOPHAGEAL DILATION  05/31/1997  . REPAIR EXTENSOR TENDON Right 05/14/2013   Procedure: LONG FINGER EXTENSOR TO ELEVATE RING AND SMALL FINGERS ;  Surgeon: Cammie Sickle., MD;  Location: [place];  Service: Orthopedics;  Laterality: Right;  . seborrheic keratosis  09/03/12   inflamed, excised from forehead  . TENDON TRANSFER Right 09/03/2013   Procedure: RIGHT HAND FLEXOR CARPI RADIALUS TRANSFER TO LONG/RING/SMALL;  Surgeon: Cammie Sickle., MD;  Location: [place];  Service: Orthopedics;  Laterality: Right;  . TONSILLECTOMY    . TUBAL LIGATION    . ULNAR HEAD EXCISION Right 05/14/2013   Procedure: ARTHOPLASTY DISTAL  RADIAL ULNAR JOINT, RIGHT ;  Surgeon: Cammie Sickle., MD;  Location: [place];  Service: Orthopedics;  Laterality: Right;  . UPPER GASTROINTESTINAL ENDOSCOPY      There were no vitals filed for this visit.  Subjective Assessment - 07/31/17 1755    Subjective  Pt feels happy with progress overall.  She is able to control urge for at least 15 minutes and states she is using less medication to manage symptoms.    Currently in Pain?  No/denies                       [facility] Adult PT Treatment/Exercise - 07/31/17 0001      Exercises   Exercises  Other Exercises    Other Exercises   reviewed and performed exercises in HEP as seen in chart             PT Education - 07/31/17 1134    Education provided  Yes    Education Details  Access Code: [email or web address]     Person(s) Educated  Patient    Methods  Explanation;Demonstration;Verbal cues;Handout    Comprehension  Verbalized understanding;Returned demonstration       PT Short Term Goals - 07/09/17 1001      PT SHORT TERM GOAL #3   Title  reports ability to have BM  without forcing using correct toileting techniques    Time  4    Period  Weeks    Status  Achieved        PT Long Term Goals - 07/31/17 1107      PT LONG TERM GOAL #1   Title  ind with advanced HEP    Time  6    Period  Weeks    Status  Achieved      PT LONG TERM GOAL #2   Title  reports ability to hold urge to have BM for > or = to 15 minutes in order to get to rest room without leakage.    Baseline  able to go 15 minutes    Time  6    Period  Weeks    Status  Achieved      PT LONG TERM GOAL #3   Title  LE 4+/5 MMT throughout for improved core and pelvic stability during functional activities    Baseline  4/5 bilateral adduction    Time  6    Period  Weeks    Status  Partially Met      PT LONG TERM GOAL #4   Title  able to demonstrate pelvic floor bulge with breathing due to improved muscle coordination    Time  6     Period  Weeks    Status  Achieved            Plan - 07/31/17 1301    Clinical Impression Statement  Patient has met most of her goals including ability to wait 15 minutes after initial . She partially met LE strength goal at this time and will continue working on strength with HEP.  Pt is able to control bowel movements.  Pt is recommended to discharge with HEP at this time    Clinical Impairments Affecting Rehab Potential  chronic constipation and diarrhea, colitis    PT Treatment/Interventions  ADLs/Self Care Home Management;Biofeedback;Taping;Dry needling;Passive range of motion;Patient/family education;Therapeutic exercise;Balance training;Neuromuscular re-education;Therapeutic activities;Stair training;Gait training;Ultrasound;Moist Heat;Cryotherapy;Electrical Stimulation    PT Next Visit Plan  discharged today    Consulted and Agree with Plan of Care  Patient       Patient will benefit from skilled therapeutic intervention in order to improve the following deficits and impairments:  Postural dysfunction, Decreased strength, Decreased coordination  Visit Diagnosis: Unspecified lack of coordination  Abnormal posture  Muscle weakness (generalized)     Problem List Patient Active Problem List   Diagnosis Date Noted  . 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 Right hip pain 02/24/2016  . Rheumatoid arthritis ([place]) 06/22/2015  . Knee pain, right 06/22/2015  . Degenerative tear of medial meniscus of left knee 11/25/2014  . Right wrist pain 04/02/2013  . Double vision 08/26/2012  . H/O long-term treatment with high-risk medication 11/08/2011  . Achilles tendon disorder 10/30/2011  . Osteoarthritis of left ankle 09/21/2011  . Tinnitus 09/11/2011  . Hip pain, left 12/14/2010  . ALLERGIC RHINITIS, CHRONIC 11/23/2008  . CONSTIPATION, CHRONIC 11/23/2008  . ACNE, ROSACEA 11/23/2008  . At risk for osteopenia 11/11/2007  . Metatarsalgia of both feet  08/27/2006  . PLANTAR FACIITIS 08/27/2006  . FOOT PAIN, BILATERAL 08/27/2006  . HYPERLIPIDEMIA 06/27/2006  . GASTROESOPHAGEAL REFLUX, NO ESOPHAGITIS 06/27/2006  . Candiss Norse SITES 06/27/2006    Zannie Cove, PT 07/31/2017, 5:59 PM  [facility] [address], [place], [place], [address] Phone: [phone]  Fax:  [phone]  Name: [name] MRN: [id number] Date of Birth: [birth year]  PHYSICAL THERAPY DISCHARGE SUMMARY  Visits from Start of Care: 11  Current functional level related to goals / functional outcomes: See above details   Remaining deficits: See above details   Education / Equipment: HEP  Plan: Patient agrees to discharge.  Patient goals were partially met. Patient is being discharged due to being pleased with the current functional level.  ?????     Google, PT 07/31/17 5:59 PM

## 2017-07-31 NOTE — Patient Instructions (Signed)
Access Code: [email or web address]  URL: [email or web address]  Date: 07/31/2017  Prepared by: Lovett Calender   Exercises  Sidelying Hip Adduction with Ankle Weight - Leg Behind - 10 reps - 2 sets - 1x daily - 5x weekly  Standing Shoulder W at [place] - 10 reps - 1 sets - 1x daily - 7x weekly  Seated Hip External Rotation with Resistance - 10 reps - 2 sets - 1x daily - 5x weekly  Seated Thoracic Lumbar Extension - 10 reps - 1 sets - 1x daily - 7x weekly  Supine Thoracic Extension on Swiss Ball - 10 reps - 1 sets - 1x daily - 7x weekly  Prone Hip Extension with Foot in Dorsiflexion and Ankle Weight - 10 reps - 2 sets - 1x daily - 5x weekly  Bird Dog on [employer] [place] [place] [employer] - 10 reps - 2 sets - 1x daily - 5x weekly  Sidelying Hip Abduction on Swiss Ball - 10 reps - 3 sets - 1x daily - 5x weekly  Supine Bridge with Mini Swiss Ball Between Knees - 10 reps - 3 sets - 1x daily - 7x weekly

## 2017-08-01 ENCOUNTER — Telehealth: Payer: Self-pay | Admitting: Sports Medicine

## 2017-08-01 NOTE — Telephone Encounter (Signed)
Would like to come by today to get some heel lifts, if we have some.  Let her know if that is not doable, thanks.

## 2017-08-02 ENCOUNTER — Ambulatory Visit: Payer: Medicare Other | Admitting: Internal Medicine

## 2017-08-02 ENCOUNTER — Other Ambulatory Visit: Payer: Medicare Other

## 2017-08-02 ENCOUNTER — Encounter: Payer: Self-pay | Admitting: Internal Medicine

## 2017-08-02 VITALS — BP 122/62 | HR 60 | Ht 63.75 in | Wt 120.0 lb

## 2017-08-02 DIAGNOSIS — N8189 Other female genital prolapse: Secondary | ICD-10-CM | POA: Diagnosis not present

## 2017-08-02 DIAGNOSIS — K52832 Lymphocytic colitis: Secondary | ICD-10-CM | POA: Diagnosis not present

## 2017-08-02 NOTE — Progress Notes (Signed)
[name] 76 y.o. 1941/04/03 [id number]  Assessment & Plan:   Encounter Diagnoses  Name Primary?  . Lymphocytic colitis Yes  . Pelvic floor weakness in female    Check Celiac serolgy Continue lowest effective dose budesonide Side efects reviewed  Prn Lomotil uses rarely RTC 1 yr sooner prn   I appreciate the opportunity to care for her. [email or web address], Cresenciano Lick, MD  Subjective:   Chief Complaint:  HPI Currently doing ok w budesonide 6 mg daily. Very rare use of Lomotil before going out usu as prophylaxis  Pelvic PT was helpful, on HEP now   Allergies  Allergen Reactions  . Celebrex [Celecoxib] Nausea And Vomiting    All cox 2 inbibators/ severe abd pain   Current Meds  Medication Sig  . acetaminophen (TYLENOL) 500 MG tablet Take 500 mg by mouth as needed.  . Biotin (BIOTIN 5000) 5 MG CAPS Take by mouth daily.  . budesonide (ENTOCORT EC) 3 MG 24 hr capsule 3 capsules daily  . calcium carbonate (TUMS EX) 750 MG chewable tablet Chew 1,500 mg by mouth.  . clobetasol (OLUX) 0.05 % topical foam APPLY TO SCALP ONCE DAILY  . Clobetasol Propionate 0.05 % shampoo APPLY ON THE SKIN AND SCALP D  . cyanocobalamin (V-R VITAMIN B-12) 500 MCG tablet Take 500 mcg by mouth.  . doxycycline (VIBRAMYCIN) 50 MG capsule TAKE 1 CAPSULE BY MOUTH DAILY, THEN DECREASE TO ONE CAPSULE EVERY OTHER DAY AFTER A COUPLE OF WEEKS  . esomeprazole (NEXIUM) 40 MG capsule TAKE 1 CAPSULE(40 MG) BY MOUTH TWICE DAILY BEFORE A MEAL  . hydrocortisone cream 1 % Apply 1 application topically as needed for itching.  . InFLIXimab (REMICADE IV) Inject into the vein. Remicade Infusion every 4 months  . Multiple Vitamins-Minerals (CENTRUM SILVER PO) Take 1 tablet by mouth daily.    . Multiple Vitamins-Minerals (EYE VITAMINS) CAPS Take by mouth.  . SOOLANTRA 1 % CREA APPLY ON THE AFFECTED AREA OF SKIN AS DIRECTED.  Marland Kitchen VITAMIN D, CHOLECALCIFEROL, PO Take 5,000 Units by mouth 3 (three) times a week.  . vitamin  E 400 UNIT capsule Take 400 Units by mouth daily.   Past Medical History:  Diagnosis Date  . Allergy    seasonal  . Anemia   . Cataract    bil cateracts removed  . Chronic constipation   . Diarrhea    chronic diarrheafor over 2 months  . GERD (gastroesophageal reflux disease)   . Hyperlipidemia   . 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 Rheumatoid arthritis ([place])   . Status post dilation of esophageal narrowing 2014  . Wears glasses    Past Surgical History:  Procedure Laterality Date  . CATARACT EXTRACTION  06/30/2016 -right eye   left eye in 08/15/16  . COLONOSCOPY    . DILATION AND CURETTAGE OF UTERUS  2009  . ESOPHAGEAL DILATION  05/31/1997  . REPAIR EXTENSOR TENDON Right 05/14/2013   Procedure: LONG FINGER EXTENSOR TO ELEVATE RING AND SMALL FINGERS ;  Surgeon: Cammie Sickle., MD;  Location: [place];  Service: Orthopedics;  Laterality: Right;  . seborrheic keratosis  09/03/12   inflamed, excised from forehead  . TENDON TRANSFER Right 09/03/2013   Procedure: RIGHT HAND FLEXOR CARPI RADIALUS TRANSFER TO LONG/RING/SMALL;  Surgeon: Cammie Sickle., MD;  Location: [place];  Service: Orthopedics;  Laterality: Right;  . TONSILLECTOMY    . TUBAL LIGATION    . ULNAR HEAD  EXCISION Right 05/14/2013   Procedure: ARTHOPLASTY DISTAL RADIAL ULNAR JOINT, RIGHT ;  Surgeon: Cammie Sickle., MD;  Location: [place];  Service: Orthopedics;  Laterality: Right;  . UPPER GASTROINTESTINAL ENDOSCOPY     Social History   Social History Narrative   Health Care POA: Husband, Fred   Emergency Contact: Fred   End of Life Plan:    Who lives with you: husband   Son, in [place]   Any pets: 3 cats   Diet: Pt has a varied diet of protein, starch, and vegetables.   Exercise: Pt works with trainer 2x a week for 60 minutes   Seatbelts: Pt reports wearing seatbelt when in vehicle.   Sun Exposure/Protection: Pt reports not  using sun protection.   Hobbies: gardening, reading, concerts               family history includes COPD in her father; Colon cancer in her paternal uncle; Heart disease in her mother; Osteoporosis in her maternal aunt, maternal grandmother, and mother.   Review of Systems As above   Objective:   Physical Exam  BP 122/62 (BP Location: Left Arm, Patient Position: Sitting, Cuff Size: Normal)   Pulse 60   Ht 5' 3.75" (1.619 m)   Wt 120 lb (54.4 kg)   BMI 20.76 kg/m  NAD  15 minutes time spent with patient > half in counseling coordination of care

## 2017-08-02 NOTE — Patient Instructions (Addendum)
If you are age 79 or older, your body mass index should be between 23-30. Your Body mass index is 20.76 kg/m. If this is out of the aforementioned range listed, please consider follow up with your Primary Care Provider.   Your provider has requested that you go to the basement level for lab work before leaving today. Press "B" on the elevator. The lab is located at the first door on the left as you exit the elevator.  Thank you, Dr. Carlean Purl

## 2017-08-02 NOTE — Progress Notes (Signed)
iga

## 2017-08-05 LAB — TISSUE TRANSGLUTAMINASE, IGA: (tTG) Ab, IgA: 8 U/mL — ABNORMAL HIGH

## 2017-08-05 LAB — IGA: Immunoglobulin A: 140 mg/dL (ref 81–463)

## 2017-08-06 NOTE — Progress Notes (Signed)
Let her know that the lab results indicate she may have celiac disease  I recommend 1) EGD with duodenal biopsy to evaluate and confirm 2) Stay on gluten (wheat, barley and rye) for now  3) The other option would be to go gluten-free and see if she gets better - I recommend that we understand this better with the EGD  If she chooses the latter I suggest she get some books on it - though could see a dietitian can do on her own - and then f/u me in June

## 2017-08-07 NOTE — Progress Notes (Signed)
OK. Thanks.

## 2017-08-19 ENCOUNTER — Other Ambulatory Visit: Payer: Self-pay | Admitting: Internal Medicine

## 2017-08-29 ENCOUNTER — Other Ambulatory Visit: Payer: Self-pay | Admitting: Internal Medicine

## 2017-09-05 ENCOUNTER — Ambulatory Visit (INDEPENDENT_AMBULATORY_CARE_PROVIDER_SITE_OTHER): Payer: Medicare Other | Admitting: Internal Medicine

## 2017-09-05 ENCOUNTER — Encounter: Payer: Self-pay | Admitting: Internal Medicine

## 2017-09-05 VITALS — BP 110/60 | HR 74 | Temp 98.1°F | Ht 63.75 in | Wt 120.0 lb

## 2017-09-05 DIAGNOSIS — R300 Dysuria: Secondary | ICD-10-CM | POA: Diagnosis not present

## 2017-09-05 MED ORDER — LEVOFLOXACIN 250 MG PO TABS: 250.0000 mg | ORAL_TABLET | Freq: Every day | ORAL | 0 refills | Status: DC

## 2017-09-05 MED ORDER — PREDNISONE 10 MG PO TABS: ORAL_TABLET | ORAL | 0 refills | Status: DC

## 2017-09-05 MED ORDER — ALBUTEROL SULFATE HFA 108 (90 BASE) MCG/ACT IN AERS: 2.0000 | INHALATION_SPRAY | Freq: Four times a day (QID) | RESPIRATORY_TRACT | 0 refills | Status: DC | PRN

## 2017-09-05 NOTE — Patient Instructions (Signed)
Sterapred DS 10 mg six-day Dosepak.  Levaquin  250 mg daily for 7 days.  Albuterol inhaler 2 sprays p.o. 4 times daily.

## 2017-09-05 NOTE — Progress Notes (Signed)
   Subjective:    Patient ID: [name], female    DOB: 10-19-1940, 77 y.o.   MRN: [id number]  HPI 77 year old with scratchy throat. Thought she was coming down with URI. Called last night with coughing spasm. Advised using husband's inhaler which worked. Last had bronchitis October 2017.  Going out of town to [place] this weekend.    Review of Systems see above.  No fever or chills.  No sputum production.     Objective:   Physical Exam Skin warm and dry.  Nodes none.  TMs are slightly full but not red.  Pharynx slightly injected without exudate.  Neck is supple.  Chest clear to auscultation without rales or wheezing today.       Assessment & Plan:   Acute bronchospasm  Plan: Sterapred DS 10 mg six-day Dosepak.  Levaquin 250 mg daily for 7 days.  This is what we used in October 2017 and it worked.  Albuterol inhaler 2 sprays p.o. 4 times daily.

## 2017-09-11 ENCOUNTER — Other Ambulatory Visit: Payer: Self-pay | Admitting: Internal Medicine

## 2017-09-11 NOTE — Telephone Encounter (Signed)
Please call her why does she need refills?

## 2017-09-11 NOTE — Telephone Encounter (Signed)
Patient called she said she feels better but she still has a lot phlegm and she would like a refill on these medications, advised that's he might need to come back to be rechecked pt verbalized understanding.

## 2017-09-11 NOTE — Telephone Encounter (Signed)
Refill these once each

## 2017-09-22 ENCOUNTER — Other Ambulatory Visit: Payer: Self-pay | Admitting: Internal Medicine

## 2017-09-24 ENCOUNTER — Other Ambulatory Visit: Payer: Self-pay | Admitting: Internal Medicine

## 2017-10-02 ENCOUNTER — Other Ambulatory Visit: Payer: Self-pay | Admitting: Internal Medicine

## 2017-10-16 ENCOUNTER — Ambulatory Visit: Payer: Medicare Other | Admitting: Internal Medicine

## 2017-10-16 ENCOUNTER — Encounter: Payer: Self-pay | Admitting: Internal Medicine

## 2017-10-16 VITALS — BP 110/80 | HR 60 | Ht 63.75 in | Wt 123.0 lb

## 2017-10-16 DIAGNOSIS — K900 Celiac disease: Secondary | ICD-10-CM

## 2017-10-16 DIAGNOSIS — E739 Lactose intolerance, unspecified: Secondary | ICD-10-CM

## 2017-10-16 DIAGNOSIS — K52832 Lymphocytic colitis: Secondary | ICD-10-CM

## 2017-10-16 HISTORY — DX: Celiac disease: K90.0

## 2017-10-16 HISTORY — DX: Lactose intolerance, unspecified: E73.9

## 2017-10-16 NOTE — Assessment & Plan Note (Signed)
I have given her information about celiac disease and gluten-free diet.  She is not interested in seeing a dietitian and she actually does not want to read books she just prefers handouts.  Does not have access to the Internet.  She will work harder and being strictly gluten-free.  I explained that it does take time and effort in its a process.  I think she should do that and then we can reassess with antibodies.  I plan to see her in September she can call next month to get that appointment.

## 2017-10-16 NOTE — Patient Instructions (Signed)
We are giving you several things to read today about celiac disease and Gluten free diet.   Per Dr Carlean Purl try Lactase which is over the counter.   Please call us the second week of July for a September appointment.     I appreciate the opportunity to care for you. Silvano Rusk, MD, [facility]

## 2017-10-16 NOTE — Assessment & Plan Note (Signed)
History suggests lactose intolerance.  Celiac disease and of itself could contribute to this.  She will try lactose-free/lactase use.

## 2017-10-16 NOTE — Progress Notes (Signed)
AVRIEL James 77 y.o. [birth year] [id number]  Assessment & Plan:   Lymphocytic colitis w/ patchy collagen deposition Better overall, is tolerating life on 6 mg budesonide.  She has episodic problems that could be related to gluten or lactose or both. She is to go completely gluten-free and work on that.  I will reassess in September.  Consider reducing budesonide dose depending how she does with the diet. I told her I did not recommend taking turmeric  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 I have given her information about celiac disease and gluten-free diet.  She is not interested in seeing a dietitian and she actually does not want to read books she just prefers handouts.  Does not have access to the Internet.  She will work harder and being strictly gluten-free.  I explained that it does take time and effort in its a process.  I think she should do that and then we can reassess with antibodies.  I plan to see her in September she can call next month to get that appointment.  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  Celiac disease and of itself could contribute to this.  She will try lactose-free/lactase use.  I appreciate the opportunity to care for this patient. CC: [name], MD    Subjective:   Chief Complaint: Follow-up of lymphocytic colitis and positive celiac testing  HPI [name] is here for follow-up of her lymphocytic colitis, in April she was seen and was doing fairly well, I did celiac testing and her TTG antibody came back mildly elevated at 8.  She declined EGD and biopsy to confirm pathologic changes in the duodenum that would demonstrate celiac disease.  She has tried to be somewhat gluten-free but is admittedly not completely compliant with that as it is hard to do.  In general she feels like she is better on budesonide and remains on 6 mg daily but she will have episodic days where she will have lots of diarrhea.  She has realized  that if she eats ice cream that the particular problem so she has eliminated that though she can tolerate a glass of skim milk.  Cookies and carbohydrates tend to bother her more as well she thinks.  She wants to know if turmeric is reasonable to take  Wt Readings from Last 3 Encounters:  10/16/17 123 lb (55.8 kg)  09/05/17 120 lb (54.4 kg)  08/02/17 120 lb (54.4 kg)    Allergies  Allergen Reactions  . Celebrex [Celecoxib] Nausea And Vomiting    All cox 2 inbibators/ severe abd pain   Current Meds  Medication Sig  . acetaminophen (TYLENOL) 500 MG tablet Take 500 mg by mouth as needed.  . Biotin (BIOTIN 5000) 5 MG CAPS Take by mouth daily.  . budesonide (ENTOCORT EC) 3 MG 24 hr capsule TAKE 3 CAPSULES EVERY DAY  . calcium carbonate (TUMS EX) 750 MG chewable tablet Chew 1,500 mg by mouth.  . clobetasol (OLUX) 0.05 % topical foam APPLY TO SCALP ONCE DAILY  . Clobetasol Propionate 0.05 % shampoo APPLY ON THE SKIN AND SCALP D  . cyanocobalamin (V-R VITAMIN B-12) 500 MCG tablet Take 500 mcg by mouth.  . doxycycline (VIBRAMYCIN) 50 MG capsule TAKE 1 CAPSULE BY MOUTH DAILY, THEN DECREASE TO ONE CAPSULE EVERY OTHER DAY AFTER A COUPLE OF WEEKS  . esomeprazole (NEXIUM) 40 MG capsule TAKE 1 CAPSULE(40 MG) BY MOUTH TWICE DAILY BEFORE A MEAL  . gabapentin (NEURONTIN) 300 MG capsule  TAKE 1 TO 2 TABLETS AS NEEDED EVERY NIGHT  . hydrocortisone cream 1 % Apply 1 application topically as needed for itching.  . InFLIXimab (REMICADE IV) Inject into the vein. Remicade Infusion every 4 months  . Multiple Vitamins-Minerals (CENTRUM SILVER PO) Take 1 tablet by mouth daily.    . Multiple Vitamins-Minerals (EYE VITAMINS) CAPS Take by mouth.  . SOOLANTRA 1 % CREA APPLY ON THE AFFECTED AREA OF SKIN AS DIRECTED.  Marland Kitchen VITAMIN D, CHOLECALCIFEROL, PO Take 5,000 Units by mouth 3 (three) times a week.  . vitamin E 400 UNIT capsule Take 400 Units by mouth daily.   Past Medical History:  Diagnosis Date  . Allergy     seasonal  . Anemia   . Cataract    bil cateracts removed  . Chronic constipation   . Diarrhea    chronic diarrheafor over 2 months  . GERD (gastroesophageal reflux disease)   . Hyperlipidemia   . 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 Rheumatoid arthritis ([place])   . Status post dilation of esophageal narrowing 2014  . Wears glasses    Past Surgical History:  Procedure Laterality Date  . CATARACT EXTRACTION  06/30/2016 -right eye   left eye in 08/15/16  . COLONOSCOPY    . DILATION AND CURETTAGE OF UTERUS  2009  . ESOPHAGEAL DILATION  05/31/1997  . REPAIR EXTENSOR TENDON Right 05/14/2013   Procedure: LONG FINGER EXTENSOR TO ELEVATE RING AND SMALL FINGERS ;  Surgeon: Cammie Sickle., MD;  Location: [place];  Service: Orthopedics;  Laterality: Right;  . seborrheic keratosis  09/03/12   inflamed, excised from forehead  . TENDON TRANSFER Right 09/03/2013   Procedure: RIGHT HAND FLEXOR CARPI RADIALUS TRANSFER TO LONG/RING/SMALL;  Surgeon: Cammie Sickle., MD;  Location: [place];  Service: Orthopedics;  Laterality: Right;  . TONSILLECTOMY    . TUBAL LIGATION    . ULNAR HEAD EXCISION Right 05/14/2013   Procedure: ARTHOPLASTY DISTAL RADIAL ULNAR JOINT, RIGHT ;  Surgeon: Cammie Sickle., MD;  Location: [place];  Service: Orthopedics;  Laterality: Right;  . UPPER GASTROINTESTINAL ENDOSCOPY     Social History   Social History Narrative   Health Care POA: Husband, Fred   Emergency Contact: Fred   End of Life Plan:    Who lives with you: husband   Son, in [place]   Any pets: 3 cats   Diet: Pt has a varied diet of protein, starch, and vegetables.   Exercise: Pt works with trainer 2x a week for 60 minutes   Seatbelts: Pt reports wearing seatbelt when in vehicle.   Sun Exposure/Protection: Pt reports not using sun protection.   Hobbies: gardening, reading, concerts               family history includes  COPD in her father; Colon cancer in her paternal uncle; Heart disease in her mother; Osteoporosis in her maternal aunt, maternal grandmother, and mother.   Review of Systems As above  Objective:   Physical Exam BP 110/80   Pulse 60   Ht 5' 3.75" (1.619 m)   Wt 123 lb (55.8 kg)   BMI 21.28 kg/m  No acute distress  15 minutes time spent with patient > half in counseling coordination of care

## 2017-10-16 NOTE — Assessment & Plan Note (Addendum)
Better overall, is tolerating life on 6 mg budesonide.  She has episodic problems that could be related to gluten or lactose or both. She is to go completely gluten-free and work on that.  I will reassess in September.  Consider reducing budesonide dose depending how she does with the diet. I told her I did not recommend taking turmeric

## 2017-10-17 ENCOUNTER — Other Ambulatory Visit: Payer: Self-pay | Admitting: Internal Medicine

## 2017-10-17 NOTE — Telephone Encounter (Signed)
May I refill Sir?

## 2017-10-17 NOTE — Telephone Encounter (Signed)
Rx faxed to pharmacy  

## 2017-10-17 NOTE — Telephone Encounter (Signed)
Refill x 2

## 2017-10-22 ENCOUNTER — Encounter: Payer: Self-pay | Admitting: Sports Medicine

## 2017-10-22 ENCOUNTER — Ambulatory Visit: Payer: Medicare Other | Admitting: Sports Medicine

## 2017-10-22 VITALS — BP 128/58 | Ht 64.0 in | Wt 115.0 lb

## 2017-10-22 DIAGNOSIS — L609 Nail disorder, unspecified: Secondary | ICD-10-CM | POA: Insufficient documentation

## 2017-10-22 DIAGNOSIS — M19072 Primary osteoarthritis, left ankle and foot: Secondary | ICD-10-CM | POA: Diagnosis not present

## 2017-10-22 DIAGNOSIS — L603 Nail dystrophy: Secondary | ICD-10-CM

## 2017-10-22 DIAGNOSIS — M7741 Metatarsalgia, right foot: Secondary | ICD-10-CM

## 2017-10-22 DIAGNOSIS — M2041 Other hammer toe(s) (acquired), right foot: Secondary | ICD-10-CM | POA: Diagnosis not present

## 2017-10-22 DIAGNOSIS — M7742 Metatarsalgia, left foot: Secondary | ICD-10-CM | POA: Diagnosis not present

## 2017-10-22 NOTE — Progress Notes (Signed)
Subjective:    [name] is a 77 y.o. old female here to discuss about hammer toe and nail change in right foot  HPI Hammer toes in right foot: Reports worsening over time.  Denies pain.  Denies tight fitting shoe.  Reports history of chronic ankle pain, left greater than right.  She also has history of metatarsalgia with significant transverse arch loss bilaterally.  Nail color change: chronic issue over 2 years. She is concerned about this due to increasing thickness and involvement of underlying soft tissue.  She has history of rheumatoid arthritis and celiac disease.  She was started on Remicade about 2 years ago.  She says she had the nail changes before she started Remicade.   Of note, patient asks for another ankle brace for her left ankle and heel lift for her right foot.  PMH/Problem List: has HYPERLIPIDEMIA; ALLERGIC RHINITIS, CHRONIC; GASTROESOPHAGEAL REFLUX, NO ESOPHAGITIS; CONSTIPATION, CHRONIC; ACNE, ROSACEA; OSTEOARTHRITIS, MULTI SITES; Metatarsalgia of both feet; PLANTAR FACIITIS; FOOT PAIN, BILATERAL; At risk for osteopenia; Hip pain, left; Tinnitus; Osteoarthritis of left ankle; Achilles tendon disorder; H/O long-term treatment with high-risk medication; Double vision; Right wrist pain; Degenerative tear of medial meniscus of left knee; Rheumatoid arthritis ([place]); Knee pain, right; Right hip pain; Lymphocytic colitis w/ patchy collagen deposition; Dysuria; 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; and Lactose intolerance in adult - suspected on their problem list.   has a past medical history of Allergy, Anemia, Cataract, Chronic constipation, Diarrhea, GERD (gastroesophageal reflux disease), Hyperlipidemia, Lymphocytic colitis w/ patchy collagen deposition (09/28/2016), Rheumatoid arthritis ([place]), Status post dilation of esophageal narrowing (2014), and Wears glasses.  FH:  Family History  Problem Relation Age of Onset  . Heart disease Mother   . Osteoporosis Mother   . COPD Father   .  Osteoporosis Maternal Grandmother   . Colon cancer Paternal Uncle   . Osteoporosis Maternal Aunt   . Esophageal cancer Neg Hx   . Pancreatic cancer Neg Hx   . Rectal cancer Neg Hx   . Stomach cancer Neg Hx     SH Social History   Tobacco Use  . Smoking status: Never Smoker  . Smokeless tobacco: Never Used  Substance Use Topics  . Alcohol use: Yes    Alcohol/week: 12.6 oz    Types: 21 Standard drinks or equivalent per week    Comment: 3 times weekly  . Drug use: No    Review of Systems Review of systems negative except for pertinent positives and negatives in history of present illness above.     Objective:     Vitals:   10/22/17 1348  BP: (!) 128/58  Weight: 115 lb (52.2 kg)  Height: 5' 4"  (1.626 m)   Body mass index is 19.74 kg/m.  Physical Exam  GEN: appears well & comfortable. No apparent distress. MSK: Right and left foot exam.  Hammertoe in her right second, third and fourth toes with significant toe nail dystrophy.  Nail discoloration in all her right and left feet toes. Some splaying of third/ fourth web space bilaterally. Mild swelling in the left ankle (chronic per patient). More loss of RT transverse arch; No overlying skin erythema. No increased warmth to touch.  No tenderness across the right metatarsal bones or right toes. NEURO: alert and oiented appropriately, no gross deficits   PSYCH: euthymic mood with congruent affect     Assessment and Plan:  1. Hammertoe of right foot: unfortunately, she could not tolerate the hammertoe pad we have in stock.  We will  order a small hammertoe pad.   2. Nail dystrophy: this could be due to a hammertoe, underlying rheumatoid arthritis or onychomycosis. - Ambulatory referral to Podiatry  3. Metatarsalgia of both feet -Metatarsal pad bilaterally (4 total)  4. Osteoarthritis of left ankle, unspecified osteoarthritis type -will order body Helix ankle brace  Mercy Riding, MD 10/22/17 Pager: [phone]  I  observed and examined the patient with the resident and agree with assessment and plan.  Note reviewed and modified by me. Stefanie Libel, MD

## 2017-10-25 ENCOUNTER — Telehealth: Payer: Self-pay | Admitting: Internal Medicine

## 2017-10-25 NOTE — Telephone Encounter (Signed)
Patient reports that she is still having diarrhea.  She reports that it is mostly at night.  We had a long talk about gluten elimination.  She is taking a fare amount of supplements. She will research the manufacturer to ensure that they do not contain gluten or eliminate them all together to see if this helps with the diarrhea.  She will  Contact her pharmacist to also research gluten free prescriptions. She is offered an appt with nutrition but she is not interested in that at this time.  She will call back if she has additional questions.  She is going to beef up her gluten free and lactose free efforts and report back with the changes.

## 2017-11-04 ENCOUNTER — Ambulatory Visit: Payer: Medicare Other | Admitting: Podiatry

## 2017-11-04 DIAGNOSIS — B351 Tinea unguium: Secondary | ICD-10-CM

## 2017-11-07 NOTE — Progress Notes (Signed)
   Subjective: 77 year old female presenting today as a new patient with a chief complaint of thickening of the bilateral great toenails that began several months ago. She reports associated yellowing of the nails. She has not done anything for treatment and there are no modifying factors noted. Patient presents today for further treatment and evaluation.  Past Medical History:  Diagnosis Date  . Allergy    seasonal  . Anemia   . Cataract    bil cateracts removed  . Chronic constipation   . Diarrhea    chronic diarrheafor over 2 months  . GERD (gastroesophageal reflux disease)   . Hyperlipidemia   . 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 Rheumatoid arthritis ([place])   . Status post dilation of esophageal narrowing 2014  . Wears glasses     Objective: Physical Exam General: The patient is alert and oriented x3 in no acute distress.  Dermatology: Hyperkeratotic, discolored, thickened, onychodystrophy of the great toenails noted bilaterally.  Skin is warm, dry and supple bilateral lower extremities. Negative for open lesions or macerations.  Vascular: Palpable pedal pulses bilaterally. No edema or erythema noted. Capillary refill within normal limits.  Neurological: Epicritic and protective threshold grossly intact bilaterally.   Musculoskeletal Exam: Range of motion within normal limits to all pedal and ankle joints bilateral. Muscle strength 5/5 in all groups bilateral.   Assessment: #1 onychomycosis bilateral hallux   Plan of Care:  #1 Patient was evaluated. #2 Discussed different treatment options with patient.  #3 She opted for laser treatment with Janett Billow.  #4 Patient declined oral Lamisil.  #5 Return to clinic as needed.    Edrick Kins, DPM [facility]  Dr. Edrick Kins, [address]                                        [place], [place] [address]                Office [phone]  Fax [phone]

## 2017-11-14 ENCOUNTER — Telehealth: Payer: Self-pay | Admitting: Internal Medicine

## 2017-11-14 NOTE — Telephone Encounter (Signed)
Patient is advised that per the manufacturers website Lactaid is gluten free.  All other questions answered.  She will call back for other questions or concerns.

## 2017-11-17 ENCOUNTER — Telehealth: Payer: Self-pay | Admitting: Physician Assistant

## 2017-11-17 NOTE — Telephone Encounter (Signed)
Pt c/o diffuse stomach ache since mid yesterday.  Moderate level of pain.  This is something she gets maybe 1 or 2 x yearly.  Took oral tums/rolaids and coca cola which normally solves the problem.  But sxs persist this AM, nausea occurred after eating 1/2 piece toast this AM but did not vomit.  Compliant with Budesondide and PPI.  Took 4 tylenol yesterday for pain from twisting her ankle.  No NSAIDs.  Having formed brown stools.   Pain not severe enough to warrant ED or UC visit.  Wanted something for the pain  Advised her to take her Dicyclomine, perhaps use some Bismuth (warned her it can constipate and turn stools dark.  Stick to clears.  Take a hot bath or apply hot water bottle or heating pad to belly.   If sxs persist she is to call Dr Wynell Balloon office in AM.  If worsen and unbearable she can come to ED.

## 2017-11-21 ENCOUNTER — Encounter: Payer: Self-pay | Admitting: Sports Medicine

## 2017-11-21 ENCOUNTER — Ambulatory Visit: Payer: Medicare Other | Admitting: Sports Medicine

## 2017-11-21 DIAGNOSIS — M79671 Pain in right foot: Secondary | ICD-10-CM | POA: Diagnosis not present

## 2017-11-21 DIAGNOSIS — M7741 Metatarsalgia, right foot: Secondary | ICD-10-CM | POA: Diagnosis not present

## 2017-11-21 DIAGNOSIS — M7742 Metatarsalgia, left foot: Secondary | ICD-10-CM

## 2017-11-21 DIAGNOSIS — M79672 Pain in left foot: Secondary | ICD-10-CM | POA: Diagnosis not present

## 2017-11-21 NOTE — Assessment & Plan Note (Signed)
We used small MT pads today and may need to increase to med. MT pads   Use MT pads in other types of shoes

## 2017-11-21 NOTE — Assessment & Plan Note (Signed)
New orthotics made today  Patient was fitted for a : standard, cushioned, semi-rigid orthotic. The orthotic was heated and afterward the patient stood on the orthotic blank positioned on the orthotic stand. The patient was positioned in subtalar neutral position and 10 degrees of ankle dorsiflexion in a weight bearing stance. After completion of molding, a stable base was applied to the orthotic blank. The blank was ground to a stable position for weight bearing. Size: 6 Red EVA Base: Black, medial taper moderate density EVA Posting: MT padding bilaterally Additional orthotic padding:none  After completion patient had much better comfort with walking

## 2017-11-21 NOTE — Progress Notes (Signed)
CC: Bilat foot pain/ metatarsalgia  Patient with Hx of chronic foot pain Now more over MT areas PLantar fascia sxs have resolved  In custom orthotics by me for > 10 yrs Not able to walk or exercise witjhout these Old orthotics are broken and need replacement MT pads in all shoes hep with MT Pain  Ros Left ankles still stiff Left anikle swells after standing too long  Gen: Thin W F In NAD BP 102/62   Ht 5' 4"  (1.626 m)   Wt 115 lb (52.2 kg)   BMI 19.74 kg/m   TTP over MT heads on plantar surface Loss of padding for transverse arch Standing shows transverse arch collapse  Left ankle with limited ROM Deformity and swelling of left ankle

## 2017-11-24 ENCOUNTER — Other Ambulatory Visit: Payer: Self-pay | Admitting: Internal Medicine

## 2017-12-02 ENCOUNTER — Ambulatory Visit: Payer: Medicare Other

## 2018-01-06 ENCOUNTER — Ambulatory Visit: Payer: Medicare Other

## 2018-01-09 ENCOUNTER — Telehealth: Payer: Self-pay | Admitting: Internal Medicine

## 2018-01-09 ENCOUNTER — Encounter: Payer: Self-pay | Admitting: Internal Medicine

## 2018-01-09 MED ORDER — GABAPENTIN 300 MG PO CAPS: ORAL_CAPSULE | ORAL | 1 refills | Status: DC

## 2018-01-09 NOTE — Telephone Encounter (Signed)
Refilled x 6 months

## 2018-01-09 NOTE — Telephone Encounter (Signed)
Calling to request a refill on her Neurontin 346m.  States last filled May of this year.  She's taking 1-2 qhs prn.    Pharmacy:  [facility] on [address]  Thank you.

## 2018-01-09 NOTE — Addendum Note (Signed)
Addended by: Mady Haagensen on: 01/09/2018 12:02 PM   Modules accepted: Orders

## 2018-01-09 NOTE — Telephone Encounter (Signed)
ESCRIBED

## 2018-01-14 ENCOUNTER — Ambulatory Visit (INDEPENDENT_AMBULATORY_CARE_PROVIDER_SITE_OTHER): Payer: Self-pay | Admitting: Podiatry

## 2018-01-14 DIAGNOSIS — B351 Tinea unguium: Secondary | ICD-10-CM

## 2018-01-17 NOTE — Progress Notes (Signed)
Pt presents with mycotic infection of nails 1-5 bilateral.  All other systems are negative  Laser therapy administered to affected nails and tolerated well. All safety precautions were in place.  2nd treatment.  Follow up in 4 weeks     

## 2018-02-07 ENCOUNTER — Other Ambulatory Visit: Payer: Self-pay | Admitting: Internal Medicine

## 2018-02-07 DIAGNOSIS — K219 Gastro-esophageal reflux disease without esophagitis: Secondary | ICD-10-CM

## 2018-02-07 DIAGNOSIS — M069 Rheumatoid arthritis, unspecified: Secondary | ICD-10-CM

## 2018-02-07 DIAGNOSIS — Z0000 Encounter for general adult medical examination without abnormal findings: Secondary | ICD-10-CM

## 2018-02-07 DIAGNOSIS — E785 Hyperlipidemia, unspecified: Secondary | ICD-10-CM

## 2018-02-07 DIAGNOSIS — M8580 Other specified disorders of bone density and structure, unspecified site: Secondary | ICD-10-CM

## 2018-02-07 DIAGNOSIS — M159 Polyosteoarthritis, unspecified: Secondary | ICD-10-CM

## 2018-02-10 ENCOUNTER — Other Ambulatory Visit: Payer: Self-pay | Admitting: Internal Medicine

## 2018-02-10 NOTE — Telephone Encounter (Signed)
Can this be refilled Dr. Carlean Purl?

## 2018-02-11 ENCOUNTER — Other Ambulatory Visit: Payer: Medicare Other | Admitting: Internal Medicine

## 2018-02-11 ENCOUNTER — Ambulatory Visit: Payer: Self-pay

## 2018-02-11 DIAGNOSIS — E785 Hyperlipidemia, unspecified: Secondary | ICD-10-CM

## 2018-02-11 DIAGNOSIS — K219 Gastro-esophageal reflux disease without esophagitis: Secondary | ICD-10-CM

## 2018-02-11 DIAGNOSIS — M069 Rheumatoid arthritis, unspecified: Secondary | ICD-10-CM

## 2018-02-11 DIAGNOSIS — M159 Polyosteoarthritis, unspecified: Secondary | ICD-10-CM

## 2018-02-11 DIAGNOSIS — M8580 Other specified disorders of bone density and structure, unspecified site: Secondary | ICD-10-CM

## 2018-02-11 DIAGNOSIS — B351 Tinea unguium: Secondary | ICD-10-CM

## 2018-02-11 DIAGNOSIS — Z0000 Encounter for general adult medical examination without abnormal findings: Secondary | ICD-10-CM

## 2018-02-11 LAB — COMPLETE METABOLIC PANEL WITH GFR
AG Ratio: 1.5 (calc) (ref 1.0–2.5)
ALBUMIN MSPROF: 4 g/dL (ref 3.6–5.1)
ALKALINE PHOSPHATASE (APISO): 47 U/L (ref 33–130)
ALT: 18 U/L (ref 6–29)
AST: 16 U/L (ref 10–35)
BUN: 14 mg/dL (ref 7–25)
CO2: 29 mmol/L (ref 20–32)
CREATININE: 0.6 mg/dL (ref 0.60–0.93)
Calcium: 9.3 mg/dL (ref 8.6–10.4)
Chloride: 103 mmol/L (ref 98–110)
GFR, Est African American: 100 mL/min/{1.73_m2} (ref >=60)
GFR, Est Non African American: 87 mL/min/{1.73_m2} (ref >=60)
GLUCOSE: 81 mg/dL (ref 65–99)
Globulin: 2.6 g/dL (calc) (ref 1.9–3.7)
Potassium: 4.2 mmol/L (ref 3.5–5.3)
Sodium: 141 mmol/L (ref 135–146)
Total Bilirubin: 0.6 mg/dL (ref 0.2–1.2)
Total Protein: 6.6 g/dL (ref 6.1–8.1)

## 2018-02-11 LAB — CBC WITH DIFFERENTIAL/PLATELET
BASOS PCT: 0.7 %
Basophils Absolute: 39 cells/uL (ref 0–200)
EOS ABS: 112 {cells}/uL (ref 15–500)
Eosinophils Relative: 2 %
HCT: 40.5 % (ref 35.0–45.0)
Hemoglobin: 14.3 g/dL (ref 11.7–15.5)
Lymphs Abs: 1960 cells/uL (ref 850–3900)
MCH: 33.1 pg — AB (ref 27.0–33.0)
MCHC: 35.3 g/dL (ref 32.0–36.0)
MCV: 93.8 fL (ref 80.0–100.0)
MONOS PCT: 8.5 %
MPV: 9.1 fL (ref 7.5–12.5)
NEUTROS ABS: 3013 {cells}/uL (ref 1500–7800)
Neutrophils Relative %: 53.8 %
Platelets: 211 10*3/uL (ref 140–400)
RBC: 4.32 10*6/uL (ref 3.80–5.10)
RDW: 13.5 % (ref 11.0–15.0)
Total Lymphocyte: 35 %
WBC mixed population: 476 cells/uL (ref 200–950)
WBC: 5.6 10*3/uL (ref 3.8–10.8)

## 2018-02-11 LAB — TSH: TSH: 2.47 mIU/L (ref 0.40–4.50)

## 2018-02-11 LAB — LIPID PANEL
CHOL/HDL RATIO: 2.2 (calc) (ref <5.0)
Cholesterol: 268 mg/dL — ABNORMAL HIGH (ref <200)
HDL: 121 mg/dL (ref >50)
LDL CHOLESTEROL (CALC): 131 mg/dL — AB
Non-HDL Cholesterol (Calc): 147 mg/dL (calc) — ABNORMAL HIGH (ref <130)
TRIGLYCERIDES: 66 mg/dL (ref <150)

## 2018-02-12 NOTE — Telephone Encounter (Signed)
May refill lomotil x 2 total  She needs to make a follow-up appointment We need to call her or send a letter to her  I do not like to use the prescription to tell them to schedule appt  Thanks

## 2018-02-13 ENCOUNTER — Telehealth: Payer: Self-pay | Admitting: Internal Medicine

## 2018-02-13 MED ORDER — DIPHENOXYLATE-ATROPINE 2.5-0.025 MG PO TABS: ORAL_TABLET | ORAL | 1 refills | Status: DC

## 2018-02-13 NOTE — Telephone Encounter (Signed)
Left a message for patient to call back and schedule follow up visit and prescription for Lomotil faxed to her pharmacy.

## 2018-02-13 NOTE — Addendum Note (Signed)
Addended by: Marzella Schlein on: 02/13/2018 01:50 PM   Modules accepted: Orders

## 2018-02-13 NOTE — Telephone Encounter (Signed)
See previous rx request call.

## 2018-02-13 NOTE — Telephone Encounter (Signed)
Left a message for patient to return my call. 

## 2018-02-17 ENCOUNTER — Other Ambulatory Visit: Payer: Self-pay | Admitting: Internal Medicine

## 2018-02-18 ENCOUNTER — Encounter: Payer: Medicare Other | Admitting: Internal Medicine

## 2018-02-18 NOTE — Progress Notes (Signed)
Pt presents with mycotic infection of nails 1-5 bilateral.  All other systems are negative  Laser therapy administered to affected nails and tolerated well. All safety precautions were in place.  3rd treatment.  Follow up in 4 weeks     

## 2018-03-03 ENCOUNTER — Encounter: Payer: Self-pay | Admitting: Internal Medicine

## 2018-03-03 ENCOUNTER — Ambulatory Visit (INDEPENDENT_AMBULATORY_CARE_PROVIDER_SITE_OTHER): Payer: Medicare Other | Admitting: Internal Medicine

## 2018-03-03 VITALS — BP 120/80 | HR 75 | Ht 64.0 in | Wt 119.0 lb

## 2018-03-03 DIAGNOSIS — M8580 Other specified disorders of bone density and structure, unspecified site: Secondary | ICD-10-CM

## 2018-03-03 DIAGNOSIS — K219 Gastro-esophageal reflux disease without esophagitis: Secondary | ICD-10-CM | POA: Diagnosis not present

## 2018-03-03 DIAGNOSIS — K52832 Lymphocytic colitis: Secondary | ICD-10-CM

## 2018-03-03 DIAGNOSIS — Z0000 Encounter for general adult medical examination without abnormal findings: Secondary | ICD-10-CM

## 2018-03-03 DIAGNOSIS — G4762 Sleep related leg cramps: Secondary | ICD-10-CM

## 2018-03-03 DIAGNOSIS — M069 Rheumatoid arthritis, unspecified: Secondary | ICD-10-CM | POA: Diagnosis not present

## 2018-03-03 DIAGNOSIS — R7989 Other specified abnormal findings of blood chemistry: Secondary | ICD-10-CM

## 2018-03-03 NOTE — Progress Notes (Signed)
Subjective:    Patient ID: [name], female    DOB: 1941/04/14, 77 y.o.   MRN: [id number]  HPI 77 year old White Female in today for Medicare wellness, health maintenance exam and evaluation of medical issues.  She was been seen by Dr. Carlean Purl in 2018 for evaluation of diarrhea.  She had colonoscopy in May 2018 and findings were consistent with lymphocytic colitis.  She took Questran and Lomotil and improved.  He suggested either budesonide or course of prednisone.  She had positive tissue transglutaminase IgA.  He suggested endoscopy but she was reluctant to pursue that since she felt better.  She elected to take budesonide. She sees Dr. Andreas Blower for musculoskeletal issues and continues to exercise.  History of rheumatoid arthritis treated with Remicade by Dr. Amil Amen.  Has a high HDL of 121 with an LDL cholesterol of 131 and total cholesterol of 268.  Triglycerides are normal at 66.  No hospitalizations other than childbirth.  History of environmental allergies.  Mild osteopenia.  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  Since then she has done well.  Remote history of plantar fasciitis.  Prior to developing colitis, she tried MiraLAX for constipation.  Social history: She is married to Titus Mould, well-known Probation officer and poet.  She has a for your college degree.  One adult son who lives in [place] and is a Therapist, nutritional.  Social alcohol consumption.  Does not smoke.  Native of [place], [place].  Family history: Father died at age 56 of complications of COPD.  Mother died at age 62 of heart failure.  No brothers or sisters.  Bone density study on file June 2012 reviewed and was within normal limits.  Bone density  study in 2014 showed T score of -1.1.  Had mammogram in January 2019.    Review of Systems she has reflux symptoms and we have previously prescribed double dose Nexium 40 mg twice daily.     Objective:   Physical Exam  Constitutional: She appears well-developed and well-nourished.  HENT:  Head: Normocephalic and atraumatic.  Right Ear: External ear normal.  Left Ear: External ear normal.  Mouth/Throat: Oropharynx is clear and moist. No oropharyngeal exudate.  Eyes: Pupils are equal, round, and reactive to light. Conjunctivae and EOM are normal. Right eye exhibits no discharge. Left eye exhibits no discharge.  Neck: Neck supple. No JVD present. No thyromegaly present.  Cardiovascular: Normal rate, regular rhythm, normal heart sounds and intact distal pulses.  No murmur heard. Pulmonary/Chest: Effort normal. No stridor. No respiratory distress. She has no wheezes. She has no rales.  Abdominal: Soft. Bowel sounds are normal. She exhibits no distension and no mass. There is no tenderness. There is no guarding.  Genitourinary:  Genitourinary Comments: Bimanual normal Pap deferred due to age  Musculoskeletal: She exhibits no edema.  Lymphadenopathy:    She has no cervical adenopathy.  Neurological: She is alert. She displays normal reflexes. No cranial nerve deficit. She exhibits normal muscle tone. Coordination normal.  Skin: Skin is warm and dry. No rash noted.  Psychiatric: She has a normal mood and affect. Her behavior is normal. Judgment and thought content normal.  Vitals reviewed.  Assessment & Plan:  Lymphocytic colitis treated with budesonide  Rheumatoid arthritis treated with Remicade  GE reflux-try double dose Nexium  Positive tissue transaminase antibodies-?  Celiac disease  Nocturnal leg cramps treated with gabapentin  High HDL cholesterol  Elevated LDL at 131  Plan: Continue same medications and return in 1 year or as needed.  Watch diet.  Continue  exercise regimen.  Subjective:   Patient presents for Medicare Annual/Subsequent preventive examination.  Review Past Medical/Family/Social: See above   Risk Factors  Current exercise habits: Exercises regularly.  Has trainer. Dietary issues discussed: Low-fat low carbohydrate  Cardiac risk factors: Elevated LDL, mother died of complications of heart failure  Depression Screen  (Note: if answer to either of the following is "Yes", a more complete depression screening is indicated)   Over the past two weeks, have you felt down, depressed or hopeless?  A bit down Over the past two weeks, have you felt little interest or pleasure in doing things? No Have you lost interest or pleasure in daily life? No Do you often feel hopeless? No Do you cry easily over simple problems? No   Activities of Daily Living  In your present state of health, do you have any difficulty performing the following activities?:   Driving? No  Managing money? No  Feeding yourself? No  Getting from bed to chair? No  Climbing a flight of stairs? No  Preparing food and eating?: No  Bathing or showering? No  Getting dressed: No  Getting to the toilet? No  Using the toilet:No  Moving around from place to place: No  In the past year have you fallen or had a near fall?:  Yes Are you sexually active?  Yes Do you have more than one partner? No   Hearing Difficulties: No  Do you often ask people to speak up or repeat themselves?  Yes do you experience ringing or noises in your ears?  Yes Do you have difficulty understanding soft or whispered voices?  Yes Do you feel that you have a problem with memory?  Sometimes Do you often misplace items?  Sometimes   Home Safety:  Do you have a smoke alarm at your residence? Yes Do you have grab bars in the bathroom?  Yes Do you have throw rugs in your house?  Yes   Cognitive Testing  Alert? Yes Normal Appearance?Yes  Oriented to person? Yes Place? Yes  Time? Yes    Recall of three objects? Yes  Can perform simple calculations? Yes  Displays appropriate judgment?Yes  Can read the correct time from a watch face?Yes   List the Names of Other Physician/Practitioners you currently use:  See referral list for the physicians patient is currently seeing.  Dr. Oneida Alar  Dr. Carlean Purl   Review of Systems: See above  Objective:     General appearance: Appears younger than stated age and thin Head: Normocephalic, without obvious abnormality, atraumatic  Eyes: conj clear, EOMi PEERLA  Ears: normal TM's and external ear canals both ears  Nose: Nares normal. Septum midline. Mucosa normal. No drainage or sinus tenderness.  Throat: lips, mucosa, and tongue normal; teeth and gums normal  Neck: no adenopathy, no carotid bruit, no JVD, supple, symmetrical, trachea midline and thyroid not enlarged, symmetric, no tenderness/mass/nodules  No CVA tenderness.  Lungs: clear to auscultation bilaterally  Breasts: normal appearance, no masses or tenderness Heart: regular rate and rhythm, S1, S2 normal, no murmur, click, rub or gallop  Abdomen: soft, non-tender; bowel sounds  normal; no masses, no organomegaly  Musculoskeletal: ROM normal in all joints, no crepitus, no deformity, Normal muscle strengthen. Back  is symmetric, no curvature. Skin: Skin color, texture, turgor normal. No rashes or lesions  Lymph nodes: Cervical, supraclavicular, and axillary nodes normal.  Neurologic: CN 2 -12 Normal, Normal symmetric reflexes. Normal coordination and gait  Psych: Alert & Oriented x 3, Mood appear stable.    Assessment:    Annual wellness medicare exam   Plan:    During the course of the visit the patient was educated and counseled about appropriate screening and preventive services including:   Annual flu vaccine  Annual mammogram       Patient Instructions (the written plan) was given to the patient.  Medicare Attestation  I have personally reviewed:  The  patient's medical and social history  Their use of alcohol, tobacco or illicit drugs  Their current medications and supplements  The patient's functional ability including ADLs,fall risks, home safety risks, cognitive, and hearing and visual impairment  Diet and physical activities  Evidence for depression or mood disorders  The patient's weight, height, BMI, and visual acuity have been recorded in the chart. I have made referrals, counseling, and provided education to the patient based on review of the above and I have provided the patient with a written personalized care plan for preventive services.

## 2018-03-12 ENCOUNTER — Ambulatory Visit: Payer: Self-pay

## 2018-03-12 DIAGNOSIS — B351 Tinea unguium: Secondary | ICD-10-CM

## 2018-03-19 NOTE — Progress Notes (Signed)
Pt presents with mycotic infection of nails 1-5 bilateral  All other systems are negative  Laser therapy administered to affected nails and tolerated well. All safety precautions were in place. 4th treatment.  Follow up in 4 weeks     

## 2018-03-28 NOTE — Patient Instructions (Signed)
Continue treatment for colitis with Dr. Carlean Purl.  Consider bone density study.  Continue with annual mammogram and annual flu vaccine.  Return in 1 year or as needed.  Was a pleasure to see you today.

## 2018-04-09 ENCOUNTER — Ambulatory Visit: Payer: Self-pay

## 2018-04-09 DIAGNOSIS — B351 Tinea unguium: Secondary | ICD-10-CM

## 2018-04-09 NOTE — Patient Instructions (Signed)
3 tablespoon of: coconut oil, olive oil, or almond oil ( any carrier oil) 10-15 drops of tea tree oil  Use mixture, rub into feet and toenails 2-3 times a week

## 2018-04-10 NOTE — Progress Notes (Signed)
Pt presents with mycotic infection of nails 1-5 bilateral  All other systems are negative  Laser therapy administered to affected nails and tolerated well. All safety precautions were in place. 4th treatment.  Follow up in 4 weeks     

## 2018-04-16 ENCOUNTER — Other Ambulatory Visit: Payer: Self-pay | Admitting: Internal Medicine

## 2018-05-06 ENCOUNTER — Telehealth: Payer: Self-pay | Admitting: Internal Medicine

## 2018-05-06 NOTE — Telephone Encounter (Signed)
error 

## 2018-05-06 NOTE — Telephone Encounter (Signed)
Patient wants to speak to nurse has questions regarding medication

## 2018-05-06 NOTE — Telephone Encounter (Signed)
Left messages on both home and cell #'s to call me back.

## 2018-05-06 NOTE — Telephone Encounter (Signed)
Left message to call me back or that I will try to reach her later today.

## 2018-05-07 ENCOUNTER — Ambulatory Visit: Payer: Medicare Other

## 2018-05-07 DIAGNOSIS — B351 Tinea unguium: Secondary | ICD-10-CM

## 2018-05-07 NOTE — Telephone Encounter (Signed)
[name] wanted to know if she could take Tumeric. I looked back in her office note from June 2019 and Dr Carlean Purl advised against it. She has an appointment early Feb. And she wants to discuss supplements that might help her Celiac and RA diseases.

## 2018-05-19 NOTE — Progress Notes (Signed)
Pt presents with mycotic infection of nails 1-5 bilateral  All other systems are negative  Laser therapy administered to affected nails and tolerated well. All safety precautions were in place. 4th treatment.  Follow up in 4 weeks     

## 2018-06-04 ENCOUNTER — Ambulatory Visit: Payer: Self-pay

## 2018-06-04 DIAGNOSIS — B351 Tinea unguium: Secondary | ICD-10-CM

## 2018-06-04 NOTE — Progress Notes (Signed)
Pt presents with mycotic infection of nails 1-5 bilateral  All other systems are negative  Laser therapy administered to affected nails and tolerated well. All safety precautions were in place.  Follow up prn

## 2018-06-06 ENCOUNTER — Encounter (INDEPENDENT_AMBULATORY_CARE_PROVIDER_SITE_OTHER): Payer: Self-pay

## 2018-06-06 ENCOUNTER — Ambulatory Visit: Payer: Medicare Other | Admitting: Internal Medicine

## 2018-06-06 ENCOUNTER — Encounter: Payer: Self-pay | Admitting: Internal Medicine

## 2018-06-06 DIAGNOSIS — K900 Celiac disease: Secondary | ICD-10-CM | POA: Diagnosis not present

## 2018-06-06 DIAGNOSIS — K52832 Lymphocytic colitis: Secondary | ICD-10-CM | POA: Diagnosis not present

## 2018-06-06 DIAGNOSIS — E739 Lactose intolerance, unspecified: Secondary | ICD-10-CM

## 2018-06-06 MED ORDER — BUDESONIDE 3 MG PO CPEP: 9.0000 mg | ORAL_CAPSULE | Freq: Every day | ORAL | 2 refills | Status: DC

## 2018-06-06 NOTE — Assessment & Plan Note (Signed)
Diet restriction

## 2018-06-06 NOTE — Assessment & Plan Note (Signed)
Budesonide 6 mg qd

## 2018-06-06 NOTE — Patient Instructions (Signed)
   Glad to hear things are going well overall.  Remember to take 6 mg (2 capsules) of budesonide daily.  Make a note to call us in December 2020 to get a February 2021 appointment.  Turmeric should be ok to try as long as gluten-free.  I appreciate the opportunity to care for you. Gatha Mayer, MD, [facility]

## 2018-06-06 NOTE — Assessment & Plan Note (Signed)
Stay gluten free as possible

## 2018-06-06 NOTE — Progress Notes (Signed)
[name] 78 y.o. 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  She reports that in general she is doing well, she also has what we think is lactose intolerance.  Her current regimen is budesonide 9 mg daily, she takes a third of a Lomotil once a week and she avoids gluten as much as she can and avoids milk products though she enjoys ice cream and will eat that and suffer with loose stools and fecal leakage at times.  In general she is satisfied with how she is doing with respect to quality of life and her gastrointestinal problems. Allergies  Allergen Reactions  . Celebrex [Celecoxib] Nausea And Vomiting    All cox 2 inbibators/ severe abd pain   Current Meds  Medication Sig  . acetaminophen (TYLENOL) 500 MG tablet Take 500 mg by mouth as needed.  . Biotin (BIOTIN 5000) 5 MG CAPS Take by mouth daily.  . budesonide (ENTOCORT EC) 3 MG 24 hr capsule TAKE 3 CAPSULES BY MOUTH EVERY DAY  Please call for mid January for an appointment.  . calcium carbonate (TUMS EX) 750 MG chewable tablet Chew 1,500 mg by mouth.  . clobetasol (OLUX) 0.05 % topical foam APPLY TO SCALP ONCE DAILY  . Clobetasol Propionate 0.05 % shampoo APPLY ON THE SKIN AND SCALP D  . cyanocobalamin (V-R VITAMIN B-12) 500 MCG tablet Take 500 mcg by mouth.  . diphenhydrAMINE (BENADRYL) 25 MG tablet Take 25 mg by mouth as needed.  . diphenoxylate-atropine (LOMOTIL) 2.5-0.025 MG tablet TAKE 1 TABLET BY MOUTH FOUR TIMES A DAY AS NEEDED FOR LOOSE STOOLS  . esomeprazole (NEXIUM) 40 MG capsule TAKE 1 CAPSULE(40 MG) BY MOUTH TWICE  DAILY BEFORE A MEAL  . gabapentin (NEURONTIN) 300 MG capsule 0ne or two qhs for leg cramps  . glucosamine-chondroitin 500-400 MG tablet Take by mouth.  . hydrocortisone cream 1 % Apply 1 application topically as needed for itching.  . InFLIXimab (REMICADE IV) Inject into the vein. Remicade Infusion every 4 months  . Multiple Vitamins-Minerals (CENTRUM SILVER PO) Take 1 tablet by mouth daily.    . Multiple Vitamins-Minerals (EYE VITAMINS) CAPS Take by mouth.  . Pseudoephedrine HCl (DECONGESTANT PO) Take 1 tablet by mouth as needed.  . SOOLANTRA 1 % CREA APPLY ON THE AFFECTED AREA OF SKIN AS DIRECTED.  Marland Kitchen VITAMIN D, CHOLECALCIFEROL, PO Take 5,000 Units by mouth 3 (three) times a week.  . vitamin E 400 UNIT capsule Take 400 Units by mouth daily.   Past Medical History:  Diagnosis Date  . Allergy    seasonal  . Anemia   . Cataract    bil cateracts removed  . Chronic constipation   . Diarrhea    chronic diarrheafor over 2 months  . GERD (gastroesophageal reflux disease)   . Hyperlipidemia   . 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 Rheumatoid arthritis ([place])   . Status post dilation of esophageal narrowing 2014  . Wears glasses    Past Surgical History:  Procedure Laterality Date  . CATARACT EXTRACTION  06/30/2016 -right eye   left eye in 08/15/16  .  COLONOSCOPY    . DILATION AND CURETTAGE OF UTERUS  2009  . ESOPHAGEAL DILATION  05/31/1997  . REPAIR EXTENSOR TENDON Right 05/14/2013   Procedure: LONG FINGER EXTENSOR TO ELEVATE RING AND SMALL FINGERS ;  Surgeon: Cammie Sickle., MD;  Location: [facility];  Service: Orthopedics;  Laterality: Right;  . seborrheic keratosis  09/03/12   inflamed, excised from forehead  . TENDON TRANSFER Right 09/03/2013   Procedure: RIGHT HAND FLEXOR CARPI RADIALUS TRANSFER TO LONG/RING/SMALL;  Surgeon: Cammie Sickle., MD;  Location: [facility];  Service: Orthopedics;  Laterality: Right;  .  TONSILLECTOMY    . TUBAL LIGATION    . ULNAR HEAD EXCISION Right 05/14/2013   Procedure: ARTHOPLASTY DISTAL RADIAL ULNAR JOINT, RIGHT ;  Surgeon: Cammie Sickle., MD;  Location: [facility];  Service: Orthopedics;  Laterality: Right;  . UPPER GASTROINTESTINAL ENDOSCOPY     Social History   Social History Narrative   Health Care POA: Husband, Fred   Emergency Contact: Fred   End of Life Plan:    Who lives with you: husband   Son, in [place]   Any pets: 3 cats   Diet: Pt has a varied diet of protein, starch, and vegetables.   Exercise: Pt works with trainer 2x a week for 60 minutes   Seatbelts: Pt reports wearing seatbelt when in vehicle.   Sun Exposure/Protection: Pt reports not using sun protection.   Hobbies: gardening, reading, concerts               family history includes COPD in her father; Colon cancer in her paternal uncle; Heart disease in her mother; Osteoporosis in her maternal aunt, maternal grandmother, and mother.   Review of Systems See HPI  Objective:   Physical Exam BP 110/80 (BP Location: Left Arm, Patient Position: Sitting, Cuff Size: Normal)   Pulse 88   Ht 5' 3.75" (1.619 m)   Wt 122 lb 6 oz (55.5 kg)   BMI 21.17 kg/m  No acute distress  15 minutes time spent with patient > half in counseling coordination of care

## 2018-07-03 ENCOUNTER — Ambulatory Visit: Payer: Medicare Other | Admitting: Internal Medicine

## 2018-07-03 ENCOUNTER — Other Ambulatory Visit: Payer: Self-pay

## 2018-07-03 ENCOUNTER — Encounter: Payer: Self-pay | Admitting: Internal Medicine

## 2018-07-03 VITALS — BP 120/80 | HR 97 | Temp 98.3°F | Ht 63.5 in | Wt 120.0 lb

## 2018-07-03 DIAGNOSIS — R61 Generalized hyperhidrosis: Secondary | ICD-10-CM

## 2018-07-21 ENCOUNTER — Other Ambulatory Visit: Payer: Self-pay

## 2018-07-21 ENCOUNTER — Ambulatory Visit (INDEPENDENT_AMBULATORY_CARE_PROVIDER_SITE_OTHER): Payer: Medicare Other

## 2018-07-21 ENCOUNTER — Other Ambulatory Visit: Payer: Self-pay | Admitting: Podiatry

## 2018-07-21 ENCOUNTER — Ambulatory Visit: Payer: Medicare Other | Admitting: Podiatry

## 2018-07-21 DIAGNOSIS — M19072 Primary osteoarthritis, left ankle and foot: Secondary | ICD-10-CM

## 2018-07-21 DIAGNOSIS — M79674 Pain in right toe(s): Secondary | ICD-10-CM

## 2018-07-21 DIAGNOSIS — M069 Rheumatoid arthritis, unspecified: Secondary | ICD-10-CM

## 2018-07-21 DIAGNOSIS — B351 Tinea unguium: Secondary | ICD-10-CM | POA: Diagnosis not present

## 2018-07-21 DIAGNOSIS — M79675 Pain in left toe(s): Secondary | ICD-10-CM

## 2018-07-24 ENCOUNTER — Encounter: Payer: Self-pay | Admitting: Podiatry

## 2018-07-24 NOTE — Progress Notes (Signed)
Subjective: [name] presents today with painful, thick toenails 1-5 b/l that she cannot cut and which interfere with daily activities. She has undergone 5 sessions of laser therapy and is pleased with results.  She also relates painful left ankle which is arthritic. She has several pairs of orthotics and a neoprene sleeve. She would like to know if her orthotics can be refurbished.  [name], MD is her PCP. Last visit 07/03/2018.   Current Outpatient Medications:  .  acetaminophen (TYLENOL) 500 MG tablet, Take 500 mg by mouth as needed., Disp: , Rfl:  .  Biotin (BIOTIN 5000) 5 MG CAPS, Take by mouth daily., Disp: , Rfl:  .  budesonide (ENTOCORT EC) 3 MG 24 hr capsule, Take 3 capsules (9 mg total) by mouth daily., Disp: 270 capsule, Rfl: 2 .  calcium carbonate (TUMS EX) 750 MG chewable tablet, Chew 1,500 mg by mouth., Disp: , Rfl:  .  clobetasol (OLUX) 0.05 % topical foam, APPLY TO SCALP ONCE DAILY, Disp: , Rfl: 1 .  Clobetasol Propionate 0.05 % shampoo, APPLY ON THE SKIN AND SCALP D, Disp: , Rfl: 2 .  cyanocobalamin (V-R VITAMIN B-12) 500 MCG tablet, Take 500 mcg by mouth., Disp: , Rfl:  .  diphenhydrAMINE (BENADRYL) 25 MG tablet, Take 25 mg by mouth as needed., Disp: , Rfl:  .  diphenoxylate-atropine (LOMOTIL) 2.5-0.025 MG tablet, TAKE 1 TABLET BY MOUTH FOUR TIMES A DAY AS NEEDED FOR LOOSE STOOLS, Disp: 60 tablet, Rfl: 1 .  doxycycline (VIBRAMYCIN) 50 MG capsule, TAKE 1 CAPSULE BY MOUTH EVERY OTHER DAY, Disp: , Rfl:  .  esomeprazole (NEXIUM) 40 MG capsule, TAKE 1 CAPSULE(40 MG) BY MOUTH TWICE DAILY BEFORE A MEAL, Disp: 60 capsule, Rfl: 11 .  gabapentin (NEURONTIN) 300 MG capsule, 0ne or two qhs for leg cramps, Disp: 180 capsule, Rfl: 1 .  glucosamine-chondroitin 500-400 MG tablet, Take by mouth., Disp: , Rfl:  .  hydrocortisone cream 1 %, Apply 1 application topically as needed for itching., Disp: , Rfl:  .  InFLIXimab (REMICADE IV), Inject into the vein. Remicade Infusion  every 4 months, Disp: , Rfl:  .  Multiple Vitamins-Minerals (CENTRUM SILVER PO), Take 1 tablet by mouth daily.  , Disp: , Rfl:  .  Multiple Vitamins-Minerals (EYE VITAMINS) CAPS, Take by mouth., Disp: , Rfl:  .  Pseudoephedrine HCl (DECONGESTANT PO), Take 1 tablet by mouth as needed., Disp: , Rfl:  .  SOOLANTRA 1 % CREA, APPLY ON THE AFFECTED AREA OF SKIN AS DIRECTED., Disp: , Rfl: 1 .  VITAMIN D, CHOLECALCIFEROL, PO, Take 5,000 Units by mouth 3 (three) times a week., Disp: , Rfl:  .  vitamin E 400 UNIT capsule, Take 400 Units by mouth daily., Disp: , Rfl:   Allergies  Allergen Reactions  . Celebrex [Celecoxib] Nausea And Vomiting    All cox 2 inbibators/ severe abd pain    Objective:  Vascular Examination: Capillary refill time immediate x 10 digits  Dorsalis pedis and Posterior tibial pulses palpable b/l  Digital hair present x 10 digits  Skin temperature gradient WNL b/l  Dermatological Examination: Skin with normal turgor, texture and tone b/l  Toenails 1-5 b/l discolored, thick, dystrophic with subungual debris and pain with palpation to nailbeds due to thickness of nails.  Musculoskeletal: Muscle strength 5/5 to all LE muscle groups  No gross bony deformities b/l.  No pain, crepitus or joint limitation noted with ROM.   Neurological: Sensation intact with 10 gram monofilament.  Vibratory sensation intact.  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  Assessment: Painful onychomycosis toenails 1-5 b/l  OA left ankle  Plan: 1. Orthotics evaluated by Pedorthist and will be sent for refurbishing. 2. Toenails 1-5 b/l were debrided in length and girth without iatrogenic bleeding. Favorable results of laser therapy. We will re-evaluate in 3 months when her nails grow out again. 3. Xray left ankle reviewed with patient. 4. Tried Tri-Lock ankle brace and compression sleeve. She would like to defer for now and continue using her Neoprene sleeve. 5. Patient  to continue soft, supportive shoe gear daily. 6. Patient to report any pedal injuries to medical professional immediately. 7. Follow up 3 months.  8. Patient/POA to call should there be a concern in the interim.

## 2018-07-28 NOTE — Progress Notes (Signed)
   Subjective:    Patient ID: SHAINDY READER, female    DOB: [birth year], 78 y.o.   MRN: [id number]  HPI 78 year old Female in today with complaint of night sweats.  No travel history. This is not a new complaint.  Explained to her it would not be a good idea to take estrogen replacement.  Her appetite is good.  No weight loss.  No documented fever.  She also has some questions about her vitamin supplements and I went over some of those preparations with her today.  She has a history of lymphocytic colitis and suspected celiac disease which is treated with budesonide.  Tries to avoid gluten.  Takes Lomotil maybe a third of a tablet once a week.  I screen results and loose stools and fecal leakage.  Review of Systems she is considering moving to [facility]     Objective:   Physical Exam Not examined but spent 10 minutes with her speaking about these issues.       Assessment & Plan:  Night sweats- explained to her that some women can have night sweats for many years.  I do not think she is a candidate for estrogen replacement.  She could possibly seek natural treatment through [facility] such as black cohosh.  Return PRN.

## 2018-07-28 NOTE — Patient Instructions (Signed)
I would advise seeking natural treatment for night sweats with reputable business such as [facility].  Do not want to prescribe estrogen replacement at this point in time.

## 2018-08-21 ENCOUNTER — Other Ambulatory Visit: Payer: Self-pay | Admitting: Internal Medicine

## 2018-08-21 MED ORDER — BUDESONIDE 3 MG PO CPEP: 9.0000 mg | ORAL_CAPSULE | Freq: Every day | ORAL | 2 refills | Status: DC

## 2018-08-21 NOTE — Telephone Encounter (Signed)
Prescription sent to patient's pharmacy.

## 2018-08-22 ENCOUNTER — Telehealth: Payer: Self-pay | Admitting: Internal Medicine

## 2018-08-22 MED ORDER — ESOMEPRAZOLE MAGNESIUM 40 MG PO CPDR: DELAYED_RELEASE_CAPSULE | ORAL | 11 refills | Status: DC

## 2018-08-22 NOTE — Telephone Encounter (Signed)
[name] [phone]  [facility]  esomeprazole ([place]) 40 MG capsule   [name] called to say she needs refill for above medication. She is having trouble getting the pharmacy to send request for refills.  LOV 3.5.20  Last CPE 11.4.2019

## 2018-09-03 ENCOUNTER — Ambulatory Visit: Payer: Medicare Other | Admitting: Internal Medicine

## 2018-10-01 ENCOUNTER — Telehealth: Payer: Self-pay | Admitting: Internal Medicine

## 2018-10-01 ENCOUNTER — Encounter: Payer: Self-pay | Admitting: Internal Medicine

## 2018-10-01 NOTE — Telephone Encounter (Signed)
Pt called about having trainer and housekeeper come to office now. Would prefer pt wait on this until Phase 3. Husband has COPD.

## 2018-10-15 ENCOUNTER — Ambulatory Visit: Payer: Medicare Other | Admitting: Podiatry

## 2018-10-22 ENCOUNTER — Ambulatory Visit: Payer: Medicare Other | Admitting: Podiatry

## 2018-11-07 ENCOUNTER — Telehealth: Payer: Self-pay | Admitting: Internal Medicine

## 2018-11-07 NOTE — Telephone Encounter (Signed)
[name] [phone]  [facility] called to see what you think about her Physical Therapist Trainer coming out on Wednesday of next week if they train outside in the garden and he wears a mask and she try's to wear one.

## 2018-11-07 NOTE — Telephone Encounter (Signed)
LVM to CB.

## 2018-11-07 NOTE — Telephone Encounter (Signed)
The safest thing is NOT to have any unnecessary exposure at this time.

## 2018-11-07 NOTE — Telephone Encounter (Signed)
Niah called back, she verbally acknowledged understanding of Dr Lauree Chandler advise.

## 2018-11-25 ENCOUNTER — Ambulatory Visit: Payer: Medicare Other | Admitting: Orthotics

## 2018-11-25 ENCOUNTER — Ambulatory Visit (INDEPENDENT_AMBULATORY_CARE_PROVIDER_SITE_OTHER): Payer: Medicare Other | Admitting: Podiatry

## 2018-11-25 ENCOUNTER — Other Ambulatory Visit: Payer: Self-pay

## 2018-11-25 ENCOUNTER — Encounter: Payer: Self-pay | Admitting: Podiatry

## 2018-11-25 DIAGNOSIS — M79675 Pain in left toe(s): Secondary | ICD-10-CM

## 2018-11-25 DIAGNOSIS — B351 Tinea unguium: Secondary | ICD-10-CM | POA: Diagnosis not present

## 2018-11-25 DIAGNOSIS — M79674 Pain in right toe(s): Secondary | ICD-10-CM | POA: Diagnosis not present

## 2018-11-25 DIAGNOSIS — M19072 Primary osteoarthritis, left ankle and foot: Secondary | ICD-10-CM

## 2018-11-25 DIAGNOSIS — M79676 Pain in unspecified toe(s): Secondary | ICD-10-CM

## 2018-11-25 DIAGNOSIS — M069 Rheumatoid arthritis, unspecified: Secondary | ICD-10-CM

## 2018-11-25 NOTE — Patient Instructions (Addendum)
Osteoarthritis  Osteoarthritis is a type of arthritis that affects tissue that covers the ends of bones in joints (cartilage). Cartilage acts as a cushion between the bones and helps them move smoothly. Osteoarthritis results when cartilage in the joints gets worn down. Osteoarthritis is sometimes called "wear and tear" arthritis. Osteoarthritis is the most common form of arthritis. It often occurs in older people. It is a condition that gets worse over time (a progressive condition). Joints that are most often affected by this condition are in:  Fingers.  Toes.  Hips.  Knees.  Spine, including neck and lower back. What are the causes? This condition is caused by age-related wearing down of cartilage that covers the ends of bones. What increases the risk? The following factors may make you more likely to develop this condition:  Older age.  Being overweight or obese.  Overuse of joints, such as in athletes.  Past injury of a joint.  Past surgery on a joint.  Family history of osteoarthritis. What are the signs or symptoms? The main symptoms of this condition are pain, swelling, and stiffness in the joint. The joint may lose its shape over time. Small pieces of bone or cartilage may break off and float inside of the joint, which may cause more pain and damage to the joint. Small deposits of bone (osteophytes) may grow on the edges of the joint. Other symptoms may include:  A grating or scraping feeling inside the joint when you move it.  Popping or creaking sounds when you move. Symptoms may affect one or more joints. Osteoarthritis in a major joint, such as your knee or hip, can make it painful to walk or exercise. If you have osteoarthritis in your hands, you might not be able to grip items, twist your hand, or control small movements of your hands and fingers (fine motor skills). How is this diagnosed? This condition may be diagnosed based on:  Your medical history.  A  physical exam.  Your symptoms.  X-rays of the affected joint(s).  Blood tests to rule out other types of arthritis. How is this treated? There is no cure for this condition, but treatment can help to control pain and improve joint function. Treatment plans may include:  A prescribed exercise program that allows for rest and joint relief. You may work with a physical therapist.  A weight control plan.  Pain relief techniques, such as: ? Applying heat and cold to the joint. ? Electric pulses delivered to nerve endings under the skin (transcutaneous electrical nerve stimulation, or TENS). ? Massage. ? Certain nutritional supplements.  NSAIDs or prescription medicines to help relieve pain.  Medicine to help relieve pain and inflammation (corticosteroids). This can be given by mouth (orally) or as an injection.  Assistive devices, such as a brace, wrap, splint, specialized glove, or cane.  Surgery, such as: ? An osteotomy. This is done to reposition the bones and relieve pain or to remove loose pieces of bone and cartilage. ? Joint replacement surgery. You may need this surgery if you have very bad (advanced) osteoarthritis. Follow these instructions at home: Activity  Rest your affected joints as directed by your health care provider.  Do not drive or use heavy machinery while taking prescription pain medicine.  Exercise as directed. Your health care provider or physical therapist may recommend specific types of exercise, such as: ? Strengthening exercises. These are done to strengthen the muscles that support joints that are affected by arthritis. They can be performed  with weights or with exercise bands to add resistance. ? Aerobic activities. These are exercises, such as brisk walking or water aerobics, that get your heart pumping. ? Range-of-motion activities. These keep your joints easy to move. ? Balance and agility exercises. Managing pain, stiffness, and swelling       If directed, apply heat to the affected area as often as told by your health care provider. Use the heat source that your health care provider recommends, such as a moist heat pack or a heating pad. ? If you have a removable assistive device, remove it as told by your health care provider. ? Place a towel between your skin and the heat source. If your health care provider tells you to keep the assistive device on while you apply heat, place a towel between the assistive device and the heat source. ? Leave the heat on for 20-30 minutes. ? Remove the heat if your skin turns bright red. This is especially important if you are unable to feel pain, heat, or cold. You may have a greater risk of getting burned.  If directed, put ice on the affected joint: ? If you have a removable assistive device, remove it as told by your health care provider. ? Put ice in a plastic bag. ? Place a towel between your skin and the bag. If your health care provider tells you to keep the assistive device on during icing, place a towel between the assistive device and the bag. ? Leave the ice on for 20 minutes, 2-3 times a day. General instructions  Take over-the-counter and prescription medicines only as told by your health care provider.  Maintain a healthy weight. Follow instructions from your health care provider for weight control. These may include dietary restrictions.  Do not use any products that contain nicotine or tobacco, such as cigarettes and e-cigarettes. These can delay bone healing. If you need help quitting, ask your health care provider.  Use assistive devices as directed by your health care provider.  Keep all follow-up visits as told by your health care provider. This is important. Where to find more information  [employer] of Arthritis and Musculoskeletal and Skin Diseases: www.niams.SouthExposed.es  [employer] on Aging: http://kim-miller.com/  American College of Rheumatology:  www.rheumatology.org Contact a health care provider if:  Your skin turns red.  You develop a rash.  You have pain that gets worse.  You have a fever along with joint or muscle aches. Get help right away if:  You lose a lot of weight.  You suddenly lose your appetite.  You have night sweats. Summary  Osteoarthritis is a type of arthritis that affects tissue covering the ends of bones in joints (cartilage).  This condition is caused by age-related wearing down of cartilage that covers the ends of bones.  The main symptom of this condition is pain, swelling, and stiffness in the joint.  There is no cure for this condition, but treatment can help to control pain and improve joint function. This information is not intended to replace advice given to you by your health care provider. Make sure you discuss any questions you have with your health care provider. Document Released: 04/16/2005 Document Revised: 03/29/2017 Document Reviewed: 12/19/2015 Elsevier Patient Education  2020 [place]? An infection that lies within the keratin of your nail plate that is caused by a fungus.  WHY ME? Fungal infections affect all ages, sexes, races, and creeds.  There may  be many factors that predispose you to a fungal infection such as age, coexisting medical conditions such as diabetes, or an autoimmune disease; stress, medications, fatigue, genetics, etc.  Bottom line: fungus thrives in a warm, moist environment and your shoes offer such a location.  IS IT CONTAGIOUS? Theoretically, yes.  You do not want to share shoes, nail clippers or files with someone who has fungal toenails.  Walking around barefoot in the same room or sleeping in the same bed is unlikely to transfer the organism.  It is important to realize, however, that fungus can spread easily from one nail to the next on the same foot.  HOW DO WE TREAT THIS?  There are several ways to treat  this condition.  Treatment may depend on many factors such as age, medications, pregnancy, liver and kidney conditions, etc.  It is best to ask your doctor which options are available to you.  1. No treatment.   Unlike many other medical concerns, you can live with this condition.  However for many people this can be a painful condition and may lead to ingrown toenails or a bacterial infection.  It is recommended that you keep the nails cut short to help reduce the amount of fungal nail. 2. Topical treatment.  These range from herbal remedies to prescription strength nail lacquers.  About 40-50% effective, topicals require twice daily application for approximately 9 to 12 months or until an entirely new nail has grown out.  The most effective topicals are medical grade medications available through physicians offices. 3. Oral antifungal medications.  With an 80-90% cure rate, the most common oral medication requires 3 to 4 months of therapy and stays in your system for a year as the new nail grows out.  Oral antifungal medications do require blood work to make sure it is a safe drug for you.  A liver function panel will be performed prior to starting the medication and after the first month of treatment.  It is important to have the blood work performed to avoid any harmful side effects.  In general, this medication safe but blood work is required. 4. Laser Therapy.  This treatment is performed by applying a specialized laser to the affected nail plate.  This therapy is noninvasive, fast, and non-painful.  It is not covered by insurance and is therefore, out of pocket.  The results have been very good with a 80-95% cure rate.  [facility] is the only practice in the area to offer this therapy. 5. Permanent Nail Avulsion.  Removing the entire nail so that a new nail will not grow back.

## 2018-11-25 NOTE — Progress Notes (Signed)
REFURBISHING OLD PAIR.

## 2018-11-27 NOTE — Progress Notes (Signed)
Subjective:  [name] presents to clinic today with cc of  painful, thick, discolored, elongated toenails 1-5 b/l that become tender and cannot cut because of thickness.  Pain is aggravated when wearing enclosed shoe gear.  [name], MD is her PCP.  She will be seeing our Pedorthist to order another pair of orthotics on today's visit.  She would like to find a neoprene brace similar to the one she's brought in today. She has h/o DJD of tibiotalar joint.   Current Outpatient Medications:  .  acetaminophen (TYLENOL) 500 MG tablet, Take 500 mg by mouth as needed., Disp: , Rfl:  .  Biotin (BIOTIN 5000) 5 MG CAPS, Take by mouth daily., Disp: , Rfl:  .  budesonide (ENTOCORT EC) 3 MG 24 hr capsule, Take 3 capsules (9 mg total) by mouth daily., Disp: 270 capsule, Rfl: 2 .  calcium carbonate (TUMS EX) 750 MG chewable tablet, Chew 1,500 mg by mouth., Disp: , Rfl:  .  clobetasol (OLUX) 0.05 % topical foam, APPLY TO SCALP ONCE DAILY, Disp: , Rfl: 1 .  Clobetasol Propionate 0.05 % shampoo, APPLY ON THE SKIN AND SCALP D, Disp: , Rfl: 2 .  cyanocobalamin (V-R VITAMIN B-12) 500 MCG tablet, Take 500 mcg by mouth., Disp: , Rfl:  .  diphenhydrAMINE (BENADRYL) 25 MG tablet, Take 25 mg by mouth as needed., Disp: , Rfl:  .  diphenoxylate-atropine (LOMOTIL) 2.5-0.025 MG tablet, TAKE 1 TABLET BY MOUTH FOUR TIMES A DAY AS NEEDED FOR LOOSE STOOLS, Disp: 60 tablet, Rfl: 1 .  doxycycline (VIBRAMYCIN) 50 MG capsule, TAKE 1 CAPSULE BY MOUTH EVERY OTHER DAY, Disp: , Rfl:  .  esomeprazole (NEXIUM) 40 MG capsule, TAKE 1 CAPSULE(40 MG) BY MOUTH TWICE DAILY BEFORE A MEAL, Disp: 60 capsule, Rfl: 11 .  gabapentin (NEURONTIN) 300 MG capsule, 0ne or two qhs for leg cramps, Disp: 180 capsule, Rfl: 1 .  glucosamine-chondroitin 500-400 MG tablet, Take by mouth., Disp: , Rfl:  .  hydrocortisone cream 1 %, Apply 1 application topically as needed for itching., Disp: , Rfl:  .  InFLIXimab (REMICADE IV), Inject into the  vein. Remicade Infusion every 4 months, Disp: , Rfl:  .  Multiple Vitamins-Minerals (CENTRUM SILVER PO), Take 1 tablet by mouth daily.  , Disp: , Rfl:  .  Multiple Vitamins-Minerals (EYE VITAMINS) CAPS, Take by mouth., Disp: , Rfl:  .  Pseudoephedrine HCl (DECONGESTANT PO), Take 1 tablet by mouth as needed., Disp: , Rfl:  .  SOOLANTRA 1 % CREA, APPLY ON THE AFFECTED AREA OF SKIN AS DIRECTED., Disp: , Rfl: 1 .  VITAMIN D, CHOLECALCIFEROL, PO, Take 5,000 Units by mouth 3 (three) times a week., Disp: , Rfl:  .  vitamin E 400 UNIT capsule, Take 400 Units by mouth daily., Disp: , Rfl:    Allergies  Allergen Reactions  . Celebrex [Celecoxib] Nausea And Vomiting    All cox 2 inbibators/ severe abd pain     Objective: Physical Examination:  Vascular Examination: Capillary refill time immediate x 10 digits.  Palpable DP/PT pulses b/l.  Digital hair present b/l.  No edema noted b/l.  Skin temperature gradient WNL b/l.  Dermatological Examination: Skin with normal turgor, texture and tone b/l.  No open wounds b/l.  No interdigital macerations noted b/l.  Elongated, thick, discolored brittle toenails with subungual debris and pain on dorsal palpation of nailbeds 1-5 b/l.  Musculoskeletal Examination: Muscle strength 5/5 to all muscle groups b/l  No pain, crepitus or  joint discomfort with active/passive ROM.  Neurological Examination: Sensation intact 5/5 b/l with 10 gram monofilament.  Vibratory sensation intact b/l.  Proprioceptive sensation intact b/l.  Assessment: Mycotic nail infection with pain 1-5 b/l  Plan: 1. Toenails 1-5 b/l were debrided in length and girth without iatrogenic laceration. 2. 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 3. Continue soft, supportive shoe gear daily. 4. Report any pedal injuries to medical professional. 5. Follow up 3 months. 6. Patient/POA to call should there be a question/concern  in there interim.

## 2018-12-02 ENCOUNTER — Other Ambulatory Visit: Payer: Medicare Other | Admitting: Orthotics

## 2018-12-24 ENCOUNTER — Telehealth: Payer: Self-pay | Admitting: Internal Medicine

## 2018-12-24 NOTE — Telephone Encounter (Signed)
Please advise Sir? 

## 2018-12-24 NOTE — Telephone Encounter (Signed)
OK to try 3 mg daily

## 2018-12-24 NOTE — Telephone Encounter (Signed)
Left her a detailed message with instructions to try and told her to call us with any more questions or problems.

## 2018-12-24 NOTE — Telephone Encounter (Signed)
Pt reported that her symptoms are improving and would like to know whether she can decrease dose of budesonide.

## 2019-01-06 ENCOUNTER — Encounter: Payer: Self-pay | Admitting: Physician Assistant

## 2019-01-06 ENCOUNTER — Ambulatory Visit (INDEPENDENT_AMBULATORY_CARE_PROVIDER_SITE_OTHER): Payer: Medicare Other | Admitting: Physician Assistant

## 2019-01-06 ENCOUNTER — Other Ambulatory Visit: Payer: Self-pay

## 2019-01-06 VITALS — BP 118/72 | HR 58 | Temp 97.8°F | Ht 64.0 in | Wt 124.2 lb

## 2019-01-06 DIAGNOSIS — K52832 Lymphocytic colitis: Secondary | ICD-10-CM | POA: Diagnosis not present

## 2019-01-06 DIAGNOSIS — K900 Celiac disease: Secondary | ICD-10-CM

## 2019-01-06 DIAGNOSIS — E739 Lactose intolerance, unspecified: Secondary | ICD-10-CM | POA: Diagnosis not present

## 2019-01-06 MED ORDER — HYOSCYAMINE SULFATE 0.125 MG SL SUBL: 0.1250 mg | SUBLINGUAL_TABLET | SUBLINGUAL | 1 refills | Status: DC | PRN

## 2019-01-06 NOTE — Progress Notes (Signed)
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      09/20/2016 colonoscopy with diverticulosis.  Biopsies revealed microscopic colitis.  At that time she was prescribed budesonide 3 mg 3 times daily.    06/06/2018 office visit with Dr. Carlean Purl to discuss lymphocytic colitis and suspected celiac disease.  At that time she was doing well with budesonide 9 mg daily.  Also reported taking a third of the Lomotil once a week and avoiding gluten and milk products.  She was satisfied at that time.  That time is recommended she take 6 mg of budesonide daily.    12/24/2018 patient reported symptoms were improving and wanted to decrease budesonide.  It was decreased to 3 mg daily.    Today, the patient presents to clinic and is somewhat hard to follow.  Tells me that over the past 2 weeks now she has been using one 3 mg tablet of Budesonide but then had 1 day of loose stool with some cramping and urgency and so the next day she took 2 Budesonide and became very constipated.  Since then the patient has just been kind of using this medication hit or miss, mostly using 2 which causes severe constipation every few days.  When she uses 1 tablet a day she seemed to be able to have a bowel movement but "it was still sometimes difficult".  Also tells me that she was feeling very good and went off of her gluten-free diet and was no longer using her Lactaid for a period of time.  She thinks this was all happening around the same time.    Denies fever, chills, weight loss, nausea, vomiting or symptoms that awaken her from sleep.  Past Medical History:  Diagnosis Date  . Allergy    seasonal  . Anemia   . Cataract    bil cateracts removed  . Chronic constipation   . Diarrhea    chronic diarrheafor over 2 months  . GERD (gastroesophageal reflux disease)   . Hyperlipidemia   . 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 Rheumatoid arthritis ([place])   . Status post dilation of esophageal narrowing 2014  . Wears glasses     Past Surgical History:  Procedure Laterality Date  . CATARACT EXTRACTION  06/30/2016 -right eye   left eye in 08/15/16  . COLONOSCOPY    . DILATION AND CURETTAGE OF UTERUS  2009  . ESOPHAGEAL DILATION  05/31/1997  . REPAIR EXTENSOR TENDON Right 05/14/2013   Procedure: LONG FINGER EXTENSOR TO ELEVATE RING AND SMALL FINGERS ;  Surgeon: Cammie Sickle., MD;  Location: [facility];  Service: Orthopedics;  Laterality: Right;  . seborrheic keratosis  09/03/12   inflamed, excised from forehead  . TENDON TRANSFER Right 09/03/2013   Procedure: RIGHT HAND FLEXOR CARPI RADIALUS TRANSFER TO LONG/RING/SMALL;  Surgeon: Cammie Sickle., MD;  Location: [facility];  Service: Orthopedics;  Laterality: Right;  . TONSILLECTOMY    . TUBAL LIGATION    . ULNAR HEAD EXCISION Right 05/14/2013   Procedure: ARTHOPLASTY DISTAL RADIAL ULNAR JOINT, RIGHT ;  Surgeon: Cammie Sickle., MD;  Location: [facility];  Service: Orthopedics;  Laterality: Right;  . UPPER GASTROINTESTINAL ENDOSCOPY  Current Outpatient Medications  Medication Sig Dispense Refill  . acetaminophen (TYLENOL) 500 MG tablet Take 500 mg by mouth as needed.    . Biotin (BIOTIN 5000) 5 MG CAPS Take by mouth daily.    . budesonide (ENTOCORT EC) 3 MG 24 hr capsule Take 3 capsules (9 mg total) by mouth daily. 270 capsule 2  . calcium carbonate (TUMS EX) 750 MG chewable tablet Chew 1,500 mg by mouth.    . clobetasol (OLUX) 0.05 % topical foam APPLY TO SCALP ONCE DAILY  1  . Clobetasol Propionate 0.05 % shampoo APPLY ON THE SKIN AND SCALP D  2  . cyanocobalamin (V-R VITAMIN B-12) 500 MCG tablet Take 500 mcg by mouth.    . diphenhydrAMINE  (BENADRYL) 25 MG tablet Take 25 mg by mouth as needed.    . diphenoxylate-atropine (LOMOTIL) 2.5-0.025 MG tablet TAKE 1 TABLET BY MOUTH FOUR TIMES A DAY AS NEEDED FOR LOOSE STOOLS 60 tablet 1  . doxycycline (VIBRAMYCIN) 50 MG capsule TAKE 1 CAPSULE BY MOUTH EVERY OTHER DAY    . esomeprazole (NEXIUM) 40 MG capsule TAKE 1 CAPSULE(40 MG) BY MOUTH TWICE DAILY BEFORE A MEAL 60 capsule 11  . gabapentin (NEURONTIN) 300 MG capsule 0ne or two qhs for leg cramps 180 capsule 1  . glucosamine-chondroitin 500-400 MG tablet Take by mouth.    . hydrocortisone cream 1 % Apply 1 application topically as needed for itching.    . InFLIXimab (REMICADE IV) Inject into the vein. Remicade Infusion every 4 months    . Multiple Vitamins-Minerals (CENTRUM SILVER PO) Take 1 tablet by mouth daily.      . Multiple Vitamins-Minerals (EYE VITAMINS) CAPS Take by mouth.    . Pseudoephedrine HCl (DECONGESTANT PO) Take 1 tablet by mouth as needed.    . SOOLANTRA 1 % CREA APPLY ON THE AFFECTED AREA OF SKIN AS DIRECTED.  1  . VITAMIN D, CHOLECALCIFEROL, PO Take 5,000 Units by mouth 3 (three) times a week.    . vitamin E 400 UNIT capsule Take 400 Units by mouth daily.     No current facility-administered medications for this visit.     Allergies as of 01/06/2019 - Review Complete 11/25/2018  Allergen Reaction Noted  . Celebrex [celecoxib] Nausea And Vomiting 12/14/2010    Family History  Problem Relation Age of Onset  . Heart disease Mother   . Osteoporosis Mother   . COPD Father   . Osteoporosis Maternal Grandmother   . Colon cancer Paternal Uncle   . Osteoporosis Maternal Aunt   . Esophageal cancer Neg Hx   . Pancreatic cancer Neg Hx   . Rectal cancer Neg Hx   . Stomach cancer Neg Hx     Social History   Socioeconomic History  . Marital status: Married    Spouse name: [name]  . Number of children: 1  . Years of education: 49  . Highest education level: Not on file  Occupational History  . Occupation:  Retired- Scientist, research (physical sciences): RETIRED  Social Needs  . Financial resource strain: Not on file  . Food insecurity    Worry: Not on file    Inability: Not on file  . Transportation needs    Medical: Not on file    Non-medical: Not on file  Tobacco Use  . Smoking status: Never Smoker  . Smokeless tobacco: Never Used  Substance and Sexual Activity  . Alcohol use: Yes    Alcohol/week: 21.0 standard drinks  Types: 21 Standard drinks or equivalent per week    Comment: 3 times weekly  . Drug use: No  . Sexual activity: Not on file  Lifestyle  . Physical activity    Days per week: Not on file    Minutes per session: Not on file  . Stress: Not on file  Relationships  . Social Herbalist on phone: Not on file    Gets together: Not on file    Attends religious service: Not on file    Active member of club or organization: Not on file    Attends meetings of clubs or organizations: Not on file    Relationship status: Not on file  . Intimate partner violence    Fear of current or ex partner: Not on file    Emotionally abused: Not on file    Physically abused: Not on file    Forced sexual activity: Not on file  Other Topics Concern  . Not on file  Social History Narrative   Health Care POA: Husband, Fred   Emergency Contact: Wilfrid Lund of Life Plan:    Who lives with you: husband   Son, in [place]   Any pets: 3 cats   Diet: Pt has a varied diet of protein, starch, and vegetables.   Exercise: Pt works with trainer 2x a week for 60 minutes   Seatbelts: Pt reports wearing seatbelt when in vehicle.   Sun Exposure/Protection: Pt reports not using sun protection.   Hobbies: gardening, reading, concerts                Review of Systems:    Constitutional: No weight loss, fever or chills Cardiovascular: No chest pain Respiratory: No SOB Gastrointestinal: See HPI and otherwise negative    Physical Exam:  Vital signs: BP 118/72 (BP Location: Left Arm, Patient  Position: Sitting, Cuff Size: Normal)   Pulse (!) 58   Temp 97.8 F (36.6 C) (Oral)   Ht 5' 4"  (1.626 m)   Wt 124 lb 4 oz (56.4 kg)   BMI 21.33 kg/m   Constitutional:   Pleasant Caucasian female appears to be in NAD, Well developed, Well nourished, alert and cooperative Respiratory: Respirations even and unlabored. Lungs clear to auscultation bilaterally.   No wheezes, crackles, or rhonchi.  Cardiovascular: Normal S1, S2. No MRG. Regular rate and rhythm. No peripheral edema, cyanosis or pallor.  Gastrointestinal:  Soft, nondistended, nontender. No rebound or guarding. Normal bowel sounds. No appreciable masses or hepatomegaly. Rectal:  Not performed.  Psychiatric: Demonstrates good judgement and reason without abnormal affect or behaviors.  No recent labs or imaging.  Assessment: 1.  Lymphocytic colitis with patchy collagen deposition: Mostly controlled with Budesonide 3 mg daily 2.  Lactose intolerance suspected: Recently went off of her Lactaid 3.  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: Recently went off a gluten-free diet  Plan: 1.  Discussed with patient that I suspect it is a combination of things for her, likely coming off of her lactose free diet and eating gluten caused her some discomfort.  It is hard to tell if decreasing to 1 Budesonide a day was really causing her symptoms. 2.  Recommend patient continue 1 tablet 3 mg of Budesonide daily. 3.  If she finds that she is still having some difficulty with straining to have a bowel movement on 1 tablet a day she can start half a dose of MiraLAX daily.  Discussed stopping this if she starts with diarrhea. 4.  Prescribed Hyoscyamine 0.125 mg sublingual tablets for the patient to use as needed for any days of cramping in the future.  Prescribed #15 with 1 refill. 5.  Patient to go back on her Lactaid and gluten-free diet. 6.  Patient to follow in clinic with Dr. Carlean Purl in 3 to 4 months or sooner if necessary.  Veronica Newer, PA-C [facility] 01/06/2019, 11:04 AM  Cc: [name], MD

## 2019-01-06 NOTE — Patient Instructions (Addendum)
Continue Budesonide 3 mg one tablet once a day.   Start Miralax 1/2 dose daily.   We have sent the following medications to your pharmacy for you to pick up at your convenience:  Hyoscyamine 0.125 mg as needed for stomach pain

## 2019-01-14 ENCOUNTER — Other Ambulatory Visit: Payer: Self-pay | Admitting: Physician Assistant

## 2019-01-26 ENCOUNTER — Telehealth: Payer: Self-pay | Admitting: Physician Assistant

## 2019-01-26 MED ORDER — BUDESONIDE 3 MG PO CPEP: 9.0000 mg | ORAL_CAPSULE | Freq: Every day | ORAL | 2 refills | Status: DC

## 2019-01-26 NOTE — Telephone Encounter (Signed)
Rx called into pharmacy. Pt informed and verified that she is taking 3 mg every other day.

## 2019-01-29 ENCOUNTER — Other Ambulatory Visit: Payer: Self-pay | Admitting: Internal Medicine

## 2019-01-29 MED ORDER — GABAPENTIN 300 MG PO CAPS: ORAL_CAPSULE | ORAL | 1 refills | Status: DC

## 2019-01-29 NOTE — Telephone Encounter (Signed)
Pt called and wants to get a refill on gabapentin (NEURONTIN) 300 MG capsule but only wants a 30 day supply

## 2019-01-29 NOTE — Addendum Note (Signed)
Addended by: Mady Haagensen on: 01/29/2019 10:07 AM   Modules accepted: Orders

## 2019-01-29 NOTE — Telephone Encounter (Signed)
Then if only wants 30 days this Rx has to be ammended

## 2019-02-03 ENCOUNTER — Other Ambulatory Visit: Payer: Self-pay | Admitting: Physician Assistant

## 2019-02-04 ENCOUNTER — Other Ambulatory Visit: Payer: Self-pay | Admitting: Gastroenterology

## 2019-02-24 ENCOUNTER — Ambulatory Visit: Payer: Medicare Other | Admitting: Podiatry

## 2019-03-10 ENCOUNTER — Other Ambulatory Visit: Payer: Self-pay

## 2019-03-10 ENCOUNTER — Other Ambulatory Visit: Payer: Medicare Other | Admitting: Internal Medicine

## 2019-03-10 DIAGNOSIS — G4762 Sleep related leg cramps: Secondary | ICD-10-CM

## 2019-03-10 DIAGNOSIS — M069 Rheumatoid arthritis, unspecified: Secondary | ICD-10-CM

## 2019-03-10 DIAGNOSIS — K219 Gastro-esophageal reflux disease without esophagitis: Secondary | ICD-10-CM

## 2019-03-10 DIAGNOSIS — R7989 Other specified abnormal findings of blood chemistry: Secondary | ICD-10-CM

## 2019-03-10 DIAGNOSIS — Z0000 Encounter for general adult medical examination without abnormal findings: Secondary | ICD-10-CM

## 2019-03-10 DIAGNOSIS — M8580 Other specified disorders of bone density and structure, unspecified site: Secondary | ICD-10-CM

## 2019-03-11 LAB — LIPID PANEL
Cholesterol: 270 mg/dL — ABNORMAL HIGH (ref <200)
HDL: 118 mg/dL (ref >=50)
LDL Cholesterol (Calc): 133 mg/dL (calc) — ABNORMAL HIGH
Non-HDL Cholesterol (Calc): 152 mg/dL (calc) — ABNORMAL HIGH (ref <130)
Total CHOL/HDL Ratio: 2.3 (calc) (ref <5.0)
Triglycerides: 87 mg/dL (ref <150)

## 2019-03-11 LAB — CBC WITH DIFFERENTIAL/PLATELET
Absolute Monocytes: 530 cells/uL (ref 200–950)
Basophils Absolute: 48 cells/uL (ref 0–200)
Basophils Relative: 0.9 %
Eosinophils Absolute: 201 cells/uL (ref 15–500)
Eosinophils Relative: 3.8 %
HCT: 41.8 % (ref 35.0–45.0)
Hemoglobin: 14 g/dL (ref 11.7–15.5)
Lymphs Abs: 2242 cells/uL (ref 850–3900)
MCH: 33.7 pg — ABNORMAL HIGH (ref 27.0–33.0)
MCHC: 33.5 g/dL (ref 32.0–36.0)
MCV: 100.5 fL — ABNORMAL HIGH (ref 80.0–100.0)
MPV: 9.1 fL (ref 7.5–12.5)
Monocytes Relative: 10 %
Neutro Abs: 2279 cells/uL (ref 1500–7800)
Neutrophils Relative %: 43 %
Platelets: 187 10*3/uL (ref 140–400)
RBC: 4.16 10*6/uL (ref 3.80–5.10)
RDW: 12.9 % (ref 11.0–15.0)
Total Lymphocyte: 42.3 %
WBC: 5.3 10*3/uL (ref 3.8–10.8)

## 2019-03-11 LAB — COMPLETE METABOLIC PANEL WITH GFR
AG Ratio: 1.6 (calc) (ref 1.0–2.5)
ALT: 15 U/L (ref 6–29)
AST: 16 U/L (ref 10–35)
Albumin: 4.2 g/dL (ref 3.6–5.1)
Alkaline phosphatase (APISO): 46 U/L (ref 37–153)
BUN/Creatinine Ratio: 23 (calc) — ABNORMAL HIGH (ref 6–22)
BUN: 13 mg/dL (ref 7–25)
CO2: 29 mmol/L (ref 20–32)
Calcium: 9.4 mg/dL (ref 8.6–10.4)
Chloride: 104 mmol/L (ref 98–110)
Creat: 0.57 mg/dL — ABNORMAL LOW (ref 0.60–0.88)
GFR, Est African American: 101 mL/min/{1.73_m2} (ref >=60)
GFR, Est Non African American: 88 mL/min/{1.73_m2} (ref >=60)
Globulin: 2.6 g/dL (calc) (ref 1.9–3.7)
Glucose, Bld: 83 mg/dL (ref 65–99)
Potassium: 3.8 mmol/L (ref 3.5–5.3)
Sodium: 141 mmol/L (ref 135–146)
Total Bilirubin: 0.6 mg/dL (ref 0.2–1.2)
Total Protein: 6.8 g/dL (ref 6.1–8.1)

## 2019-03-11 LAB — TSH: TSH: 2.45 mIU/L (ref 0.40–4.50)

## 2019-03-17 ENCOUNTER — Other Ambulatory Visit: Payer: Self-pay

## 2019-03-17 ENCOUNTER — Ambulatory Visit (INDEPENDENT_AMBULATORY_CARE_PROVIDER_SITE_OTHER): Payer: Medicare Other | Admitting: Internal Medicine

## 2019-03-17 ENCOUNTER — Encounter: Payer: Self-pay | Admitting: Internal Medicine

## 2019-03-17 VITALS — BP 100/70 | HR 60 | Temp 98.0°F | Ht 64.0 in | Wt 123.0 lb

## 2019-03-17 DIAGNOSIS — K52832 Lymphocytic colitis: Secondary | ICD-10-CM

## 2019-03-17 DIAGNOSIS — I4891 Unspecified atrial fibrillation: Secondary | ICD-10-CM

## 2019-03-17 DIAGNOSIS — Z0000 Encounter for general adult medical examination without abnormal findings: Secondary | ICD-10-CM | POA: Diagnosis not present

## 2019-03-17 DIAGNOSIS — R7989 Other specified abnormal findings of blood chemistry: Secondary | ICD-10-CM

## 2019-03-17 DIAGNOSIS — M8580 Other specified disorders of bone density and structure, unspecified site: Secondary | ICD-10-CM

## 2019-03-17 DIAGNOSIS — E7800 Pure hypercholesterolemia, unspecified: Secondary | ICD-10-CM

## 2019-03-17 LAB — POCT URINALYSIS DIPSTICK
Appearance: NEGATIVE
Bilirubin, UA: NEGATIVE
Blood, UA: NEGATIVE
Glucose, UA: NEGATIVE
Ketones, UA: NEGATIVE
Leukocytes, UA: NEGATIVE
Nitrite, UA: NEGATIVE
Odor: NEGATIVE
Protein, UA: NEGATIVE
Spec Grav, UA: 1.015 (ref 1.010–1.025)
Urobilinogen, UA: 0.2 E.U./dL
pH, UA: 6.5 (ref 5.0–8.0)

## 2019-03-17 NOTE — Patient Instructions (Addendum)
Referral to ENT for hearing loss and cerumen extraction. Referral to Cardiology for new onset A-fib.

## 2019-03-17 NOTE — Progress Notes (Signed)
Subjective:    Patient ID: [name], female    DOB: 02/26/1941, 78 y.o.   MRN: [id number]  HPI  78 year old Female in today for Medicare wellness, health maintenance exam and evaluation of medical issues.  Exercises regularly.  Has seen Dr. Andreas Blower for musculoskeletal issues.  History of rheumatoid arthritis treated with Remicade by Dr. Amil Amen.  History of high HDL cholesterol.  Her HDL from recently is 118.  Her total cholesterol is 270 and LDL cholesterol is 133.  10 years ago her LDL was 119 with a total cholesterol of 247.  I have convinced her to try Crestor 5 mg weekly with follow-up in 3 months.  She is agreeable to this.  In January 2015 she had surgery by Dr. 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  Remote history of plantar fasciitis.  History of lymphocytic colitis treated by Dr. Carlean Purl and diagnosed in 2018 presenting with diarrhea.  She had colonoscopy May 2018.  She is treated with budesonide.  Social history: She is married to Shelly Flatten, well known Probation officer and poet.  She has a Education officer, community.  1 adult son who lives in [place].  Social alcohol consumption.  Does not smoke.  Family history: Father died at age 45 of complications of COPD.  Mother died at age 79 of heart failure.  No brothers or sisters.  Had bone density study June 2012 which was normal.  Bone density study in 2014 showed a T score of -1.1.  Last mammogram was January 2019.  Needs to have one in the near future.  No hospitalizations other than childbirth.  History of environmental allergies.  Mild osteopenia.      Review of Systems  Constitutional: Negative.   Respiratory:       Has noticed some shortness of breath going up second flight of stairs.  Denies chest pain.  Genitourinary:  Negative.   Neurological: Negative.   Psychiatric/Behavioral: Negative.   Complaining of decreased hearing and discomfort in left ear     Objective:   Physical Exam Pleasant female in no acute distress.  Blood pressure 100/70 pulse 60 irregular temperature 98 degrees orally pulse oximetry 98% weight 123 pounds BMI 21.11  Skin warm and dry.  Nodes none.  Right TM clear.  Left TM obscured by cerumen.  Neck is supple without JVD thyromegaly or carotid bruits.  Chest clear to auscultation without rales or wheezing.  Cardiac exam irregular irregular rhythm.  No murmur appreciated.  Abdomen soft nondistended without hepatosplenomegaly masses or tenderness.  Bimanual exam is normal.  Pap deferred due to age.  Neuro intact without focal deficits.  Judgment, affect mentation, and thought process within normal limits.  No lower extremity edema.  EKG shows irregular irregular rhythm consistent with atrial fibrillation       Assessment & Plan:  New onset atrial fibrillation with rate fluctuating between 70 and 100 bpm.  No evidence of congestive heart failure.  Discussion with patient and her husband.  Would like for her to have cardiology evaluation.  She is agreeable to this.  Elevated LDL-like for patient to start Crestor 5 mg once a week.  She is agreeable to this.  History of rheumatoid arthritis  History of lymphocytic colitis  High HDL cholesterol  History of  GE reflux  Left ear discomfort and complained of decreased hearing-refer to ENT.  There is cerumen in left external ear canal.  Plan: Follow-up in 3 months with regard to lipid management.  Cardiology evaluation.  Needs to have annual mammogram.  Would like for her to have bone density study.  Flu immunization is up-to-date.  Other immunizations reviewed and are up-to-date except for Shingrix which will be deferred for now until after the pandemic.  Subjective:   Patient presents for Medicare Annual/Subsequent preventive  examination.  Review Past Medical/Family/Social: See above  Risk Factors  Current exercise habits: Exercises regularly Dietary issues discussed: Low-fat low carbohydrate  Cardiac risk factors: Elevated LDL cholesterol, mother died of complications of heart failure  Depression Screen  (Note: if answer to either of the following is "Yes", a more complete depression screening is indicated)   Over the past two weeks, have you felt down, depressed or hopeless? No  Over the past two weeks, have you felt little interest or pleasure in doing things? No Have you lost interest or pleasure in daily life? No Do you often feel hopeless? No Do you cry easily over simple problems? No   Activities of Daily Living  In your present state of health, do you have any difficulty performing the following activities?:   Driving? No  Managing money? No  Feeding yourself? No  Getting from bed to chair? No  Climbing a flight of stairs?  Have noticed some shortness of breath going up second flight of stairs Preparing food and eating?: No  Bathing or showering? No  Getting dressed: No  Getting to the toilet? No  Using the toilet:No  Moving around from place to place: No  In the past year have you fallen or had a near fall?:No  Are you sexually active? yes Do you have more than one partner? No   Hearing Difficulties:  Do you often ask people to speak up or repeat themselves?  Yes Do you experience ringing or noises in your ears?  Yes Do you have difficulty understanding soft or whispered voices?  Yes Do you feel that you have a problem with memory?  Occasionally Do you often misplace items?  Occasionally   Home Safety:  Do you have a smoke alarm at your residence? Yes Do you have grab bars in the bathroom?  Yes Do you have throw rugs in your house?  Yes   Cognitive Testing  Alert? Yes Normal Appearance?Yes  Oriented to person? Yes Place? Yes  Time? Yes  Recall of three objects? Yes  Can  perform simple calculations? Yes  Displays appropriate judgment?Yes  Can read the correct time from a watch face?Yes   List the Names of Other Physician/Practitioners you currently use:  See referral list for the physicians patient is currently seeing.   Dr. Carlean Purl Dr. Andreas Blower  Review of Systems: See above   Objective:     General appearance: Appears stated age and thin Head: Normocephalic, without obvious abnormality, atraumatic  Eyes: conj clear, EOMi PEERLA  Ears: normal TM's and external ear canals both ears  Nose: Nares normal. Septum midline. Mucosa normal. No drainage or sinus tenderness.  Throat: lips, mucosa, and tongue normal; teeth and gums normal  Neck: no adenopathy, no carotid bruit, no JVD, supple, symmetrical, trachea midline and thyroid not enlarged, symmetric, no tenderness/mass/nodules  No CVA tenderness.  Lungs: clear to auscultation bilaterally  Breasts: normal appearance, no masses or tenderness Heart: Irregular irregular rhythm, no murmur Abdomen:  soft, non-tender; bowel sounds normal; no masses, no organomegaly  Musculoskeletal: ROM normal in all joints, no crepitus, no deformity, Normal muscle strengthen. Back  is symmetric, no curvature. Skin: Skin color, texture, turgor normal. No rashes or lesions  Lymph nodes: Cervical, supraclavicular, and axillary nodes normal.  Neurologic: CN 2 -12 Normal, Normal symmetric reflexes. Normal coordination and gait  Psych: Alert & Oriented x 3, Mood appear stable.    Assessment:    Annual wellness medicare exam   Plan:    During the course of the visit the patient was educated and counseled about appropriate screening and preventive services including:  She is a very active and viable 78 year old female so would like for her to have mammogram and bone density study.      Patient Instructions (the written plan) was given to the patient.  Medicare Attestation  I have personally reviewed:  The patient's  medical and social history  Their use of alcohol, tobacco or illicit drugs  Their current medications and supplements  The patient's functional ability including ADLs,fall risks, home safety risks, cognitive, and hearing and visual impairment  Diet and physical activities  Evidence for depression or mood disorders  The patient's weight, height, BMI, and visual acuity have been recorded in the chart. I have made referrals, counseling, and provided education to the patient based on review of the above and I have provided the patient with a written personalized care plan for preventive services.

## 2019-03-23 ENCOUNTER — Other Ambulatory Visit: Payer: Self-pay

## 2019-03-23 MED ORDER — ROSUVASTATIN CALCIUM 5 MG PO TABS: ORAL_TABLET | ORAL | 3 refills | Status: DC

## 2019-03-31 NOTE — Progress Notes (Signed)
Cardiology Office Note:    Date:  04/03/2019   ID:  [name], DOB 12/01/1940, MRN [id number]  PCP:  [name], MD  Cardiologist:  No primary care provider on file.  Electrophysiologist:  None   Referring MD: [name], MD   Chief Complaint  Patient presents with  . Atrial Fibrillation    History of Present Illness:    [name] is a 78 y.o. female with a hx of  hyperlipidemia, rheumatoid arthritis, lymphocytic colitis, GERD who is referred by Dr. Renold Genta for evaluation of new onset atrial fibrillation.  She was found to be in atrial fibrillation at her appointment with Dr. Renold Genta on 03/17/2019, with heart rate around 100 bpm.  She has been asymptomatic.  She has no known cardiac history.  She denies any palpitations, chest pain, dyspnea.  She exercises regularly, as she does 45-minute workouts twice per week mainly involving upper body workout, crunches, squats.  She denies any exertional symptoms.   Past Medical History:  Diagnosis Date  . Allergy    seasonal  . Anemia   . Cataract    bil cateracts removed  . Chronic constipation   . Diarrhea    chronic diarrheafor over 2 months  . GERD (gastroesophageal reflux disease)   . Hyperlipidemia   . 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 Rheumatoid arthritis ([place])   . Status post dilation of esophageal narrowing 2014  . Wears glasses     Past Surgical History:  Procedure Laterality Date  . CATARACT EXTRACTION  06/30/2016 -right eye   left eye in 08/15/16  . COLONOSCOPY    . DILATION AND CURETTAGE OF UTERUS  2009  . ESOPHAGEAL DILATION  05/31/1997  . REPAIR EXTENSOR TENDON Right 05/14/2013   Procedure: LONG FINGER EXTENSOR TO ELEVATE RING AND SMALL FINGERS ;  Surgeon: Cammie Sickle., MD;  Location: [facility];  Service: Orthopedics;  Laterality: Right;  . seborrheic keratosis  09/03/12   inflamed, excised from forehead  . TENDON TRANSFER Right 09/03/2013   Procedure: RIGHT HAND FLEXOR CARPI RADIALUS TRANSFER TO LONG/RING/SMALL;  Surgeon: Cammie Sickle., MD;  Location: [facility];  Service: Orthopedics;  Laterality: Right;  . TONSILLECTOMY    . TUBAL LIGATION    . ULNAR HEAD EXCISION Right 05/14/2013   Procedure: ARTHOPLASTY DISTAL RADIAL ULNAR JOINT, RIGHT ;  Surgeon: Cammie Sickle., MD;  Location: [facility];  Service: Orthopedics;  Laterality: Right;  . UPPER GASTROINTESTINAL ENDOSCOPY      Current Medications: Current Meds  Medication Sig  . acetaminophen (TYLENOL) 500 MG tablet Take 500 mg by mouth as needed.  . Biotin (BIOTIN 5000) 5 MG CAPS Take by mouth daily.  . budesonide (ENTOCORT EC) 3 MG 24 hr capsule Take 3 capsules (9 mg total) by mouth daily.  . calcium carbonate (TUMS EX) 750 MG chewable tablet Chew 1,500 mg by mouth.  . clobetasol (OLUX) 0.05 % topical foam APPLY TO SCALP ONCE DAILY  . diphenhydrAMINE (BENADRYL) 25 MG tablet Take 25 mg by mouth as needed.  Marland Kitchen esomeprazole (NEXIUM) 40 MG capsule TAKE 1 CAPSULE(40 MG) BY MOUTH TWICE DAILY BEFORE A MEAL  . glucosamine-chondroitin 500-400 MG tablet Take by mouth.  . InFLIXimab (REMICADE IV) Inject into the vein. Remicade Infusion every 4 months  . Multiple Vitamins-Minerals (CENTRUM SILVER PO) Take 1 tablet by mouth daily.    . Multiple Vitamins-Minerals (EYE VITAMINS) CAPS Take  by mouth.  . rosuvastatin (CRESTOR) 5 MG tablet TAKE ONE TABLET BY MOUTH ONCE A WEEK  . vitamin E 400 UNIT capsule Take 400 Units by mouth daily.  . [DISCONTINUED] Clobetasol Propionate 0.05 % shampoo APPLY ON THE SKIN AND SCALP D     Allergies:   Celebrex [celecoxib]   Social History   Socioeconomic History  . Marital status: Married    Spouse name: [name]  . Number of children: 1  . Years of education: 87  . Highest education level: Not on file  Occupational History  . Occupation: Retired- Scientist, research (physical sciences): RETIRED  Social Needs  . Financial  resource strain: Not on file  . Food insecurity    Worry: Not on file    Inability: Not on file  . Transportation needs    Medical: Not on file    Non-medical: Not on file  Tobacco Use  . Smoking status: Never Smoker  . Smokeless tobacco: Never Used  Substance and Sexual Activity  . Alcohol use: Yes    Alcohol/week: 21.0 standard drinks    Types: 21 Standard drinks or equivalent per week    Comment: 3 times weekly  . Drug use: No  . Sexual activity: Not on file  Lifestyle  . Physical activity    Days per week: Not on file    Minutes per session: Not on file  . Stress: Not on file  Relationships  . Social Herbalist on phone: Not on file    Gets together: Not on file    Attends religious service: Not on file    Active member of club or organization: Not on file    Attends meetings of clubs or organizations: Not on file    Relationship status: Not on file  Other Topics Concern  . Not on file  Social History Narrative   Health Care POA: Husband, Fred   Emergency Contact: Wilfrid Lund of Life Plan:    Who lives with you: husband   Son, in [place]   Any pets: 3 cats   Diet: Pt has a varied diet of protein, starch, and vegetables.   Exercise: Pt works with trainer 2x a week for 60 minutes   Seatbelts: Pt reports wearing seatbelt when in vehicle.   Sun Exposure/Protection: Pt reports not using sun protection.   Hobbies: gardening, reading, concerts                 Family History: The patient's family history includes COPD in her father; Colon cancer in her paternal uncle; Heart disease in her mother; Osteoporosis in her maternal aunt, maternal grandmother, and mother. There is no history of Esophageal cancer, Pancreatic cancer, Rectal cancer, or Stomach cancer.  ROS:   Please see the history of present illness.    A whole 8 I get asked the minimum of once a day bowel dialysis I assessment all other systems reviewed and are negative.  EKGs/Labs/Other Studies  Reviewed:    The following studies were reviewed today:   EKG:  EKG is  ordered today.  The ekg ordered today demonstrates atrial fibrillation, rate 101, PVC  Recent Labs: 03/10/2019: ALT 15; BUN 13; Creat 0.57; Hemoglobin 14.0; Platelets 187; Potassium 3.8; Sodium 141; TSH 2.45  Recent Lipid Panel    Component Value Date/Time   CHOL 270 (H) 03/10/2019 0901   TRIG 87 03/10/2019 0901   HDL 118 03/10/2019 0901   CHOLHDL 2.3 03/10/2019 0901  VLDL 19 08/03/2016 1001   LDLCALC 133 (H) 03/10/2019 0901   LDLDIRECT 149 (H) 11/11/2007 2043    Physical Exam:    VS:  BP 130/74   Pulse (!) 111   Resp (!) 98   Ht 5' 4"  (1.626 m)   Wt 124 lb (56.2 kg)   BMI 21.28 kg/m     Wt Readings from Last 3 Encounters:  04/03/19 124 lb (56.2 kg)  03/17/19 123 lb (55.8 kg)  01/06/19 124 lb 4 oz (56.4 kg)     GEN: Well nourished, well developed in no acute distress HEENT: Normal NECK: No JVD LYMPHATICS: No lymphadenopathy CARDIAC: irregular, tachycardic, no murmurs, rubs, gallops RESPIRATORY:  Clear to auscultation without rales, wheezing or rhonchi  ABDOMEN: Soft, non-tender, non-distended MUSCULOSKELETAL:  No edema; No deformity  SKIN: Warm and dry NEUROLOGIC:  Alert and oriented x 3 PSYCHIATRIC:  Normal affect   ASSESSMENT:    1. Atrial fibrillation, unspecified type ([place])    PLAN:    In order of problems listed above:   Atrial fibrillation: new diagnosis.   She is asymptomatic.  CHADS-VASc score 3 (agex2, female).  HR 100 today, not on any rate controlling medication. -Start Eliquis.  Given age 17 and weight <60kg, meets indication for reduced dose.  Will start 2.5 mg twice daily -Start metoprolol 25 mg twice daily for rate control -TTE -Given new onset AF, will try to restore sinus rhythm.  We will schedule follow-up in 2 weeks.  If still in AF, will schedule cardioversion for when she has completed 3 weeks of anticoagulation  RTC in 2 weeks   Medication Adjustments/Labs  and Tests Ordered: Current medicines are reviewed at length with the patient today.  Concerns regarding medicines are outlined above.  Orders Placed This Encounter  Procedures  . EKG 12-Lead  . ECHOCARDIOGRAM COMPLETE   Meds ordered this encounter  Medications  . apixaban (ELIQUIS) 2.5 MG TABS tablet    Sig: Take 1 tablet (2.5 mg total) by mouth 2 (two) times daily.    Dispense:  180 tablet    Refill:  3  . metoprolol tartrate (LOPRESSOR) 25 MG tablet    Sig: Take 1 tablet (25 mg total) by mouth 2 (two) times daily.    Dispense:  180 tablet    Refill:  3    Patient Instructions  Medication Instructions:  Start: Eliquis 2.5 mg twice a day           Metoprolol 25 mg twice a day  *If you need a refill on your cardiac medications before your next appointment, please call your pharmacy*  Lab Work: None  Testing/Procedures: Your physician has requested that you have an echocardiogram. Echocardiography is a painless test that uses sound waves to create images of your heart. It provides your doctor with information about the size and shape of your heart and how well your heart's chambers and valves are working. This procedure takes approximately one hour. There are no restrictions for this procedure. [place] [address]   Follow-Up: At [facility], you and your health needs are our priority.  As part of our continuing mission to provide you with exceptional heart care, we have created designated Provider Care Teams.  These Care Teams include your primary Cardiologist (physician) and Advanced Practice Providers (APPs -  Physician Assistants and Nurse Practitioners) who all work together to provide you with the care you need, when you need it.  Your next appointment:  2 week(s)  The format for your next appointment:   In Person  Provider:   Oswaldo Milian, MD       Signed, Donato Heinz, MD  04/03/2019 10:21 AM    [place]

## 2019-04-03 ENCOUNTER — Ambulatory Visit: Payer: Medicare Other | Admitting: Cardiology

## 2019-04-03 ENCOUNTER — Other Ambulatory Visit: Payer: Self-pay

## 2019-04-03 VITALS — BP 130/74 | HR 111 | Resp 98 | Ht 64.0 in | Wt 124.0 lb

## 2019-04-03 DIAGNOSIS — I4891 Unspecified atrial fibrillation: Secondary | ICD-10-CM

## 2019-04-03 MED ORDER — METOPROLOL TARTRATE 25 MG PO TABS: 25.0000 mg | ORAL_TABLET | Freq: Two times a day (BID) | ORAL | 3 refills | Status: DC

## 2019-04-03 MED ORDER — APIXABAN 2.5 MG PO TABS: 2.5000 mg | ORAL_TABLET | Freq: Two times a day (BID) | ORAL | 3 refills | Status: DC

## 2019-04-03 NOTE — Patient Instructions (Signed)
Medication Instructions:  Start: Eliquis 2.5 mg twice a day           Metoprolol 25 mg twice a day  *If you need a refill on your cardiac medications before your next appointment, please call your pharmacy*  Lab Work: None  Testing/Procedures: Your physician has requested that you have an echocardiogram. Echocardiography is a painless test that uses sound waves to create images of your heart. It provides your doctor with information about the size and shape of your heart and how well your heart's chambers and valves are working. This procedure takes approximately one hour. There are no restrictions for this procedure. [place] [address]   Follow-Up: At [facility], you and your health needs are our priority.  As part of our continuing mission to provide you with exceptional heart care, we have created designated Provider Care Teams.  These Care Teams include your primary Cardiologist (physician) and Advanced Practice Providers (APPs -  Physician Assistants and Nurse Practitioners) who all work together to provide you with the care you need, when you need it.  Your next appointment:   2 week(s)  The format for your next appointment:   In Person  Provider:   Oswaldo Milian, MD

## 2019-04-08 ENCOUNTER — Encounter: Payer: Self-pay | Admitting: Podiatry

## 2019-04-08 ENCOUNTER — Ambulatory Visit: Payer: Medicare Other | Admitting: Podiatry

## 2019-04-08 ENCOUNTER — Other Ambulatory Visit: Payer: Self-pay

## 2019-04-08 DIAGNOSIS — M79674 Pain in right toe(s): Secondary | ICD-10-CM | POA: Diagnosis not present

## 2019-04-08 DIAGNOSIS — M79675 Pain in left toe(s): Secondary | ICD-10-CM

## 2019-04-08 DIAGNOSIS — B351 Tinea unguium: Secondary | ICD-10-CM | POA: Diagnosis not present

## 2019-04-15 ENCOUNTER — Other Ambulatory Visit (HOSPITAL_COMMUNITY): Payer: Medicare Other

## 2019-04-16 NOTE — Progress Notes (Signed)
Subjective: [name] is seen today for follow up painful, elongated, thickened toenails bilateral feet that she cannot cut. Pain interferes with daily activities. Aggravating factor includes wearing enclosed shoe gear and relieved with periodic debridement.  She voices no new pedal problems on today's visit.  Medications reviewed in chart.   Allergies  Allergen Reactions  . Celebrex [Celecoxib] Nausea And Vomiting    All cox 2 inbibators/ severe abd pain     Objective:  Vascular Examination: Capillary refill time immediate b/l.  Dorsalis pedis present b/l.  Posterior tibial pulses present b/l.  Digital hair present b/l.   Skin temperature gradient WNL b/l.   Dermatological Examination: Skin with normal turgor, texture and tone b/l.  Toenails 1-5 b/l discolored, thick, dystrophic with subungual debris and pain with palpation to nailbeds due to thickness of nails.  Musculoskeletal: Muscle strength 5/5 to all LE muscle groups b/l.  No pain, crepitus or joint limitation noted with ROM.   Neurological Examination: Protective sensation intact with 10 gram monofilament bilaterally.  Assessment: Painful onychomycosis toenails 1-5 b/l   Plan: 1. Toenails 1-5 b/l were debrided in length and girth without iatrogenic bleeding. 2. Patient to continue soft, supportive shoe gear daily. 3. Patient to report any pedal injuries to medical professional immediately. 4. Follow up 3 months.  5. Patient/POA to call should there be a concern in the interim.

## 2019-04-17 ENCOUNTER — Ambulatory Visit: Payer: Medicare Other | Admitting: Cardiology

## 2019-04-20 NOTE — Progress Notes (Deleted)
Cardiology Office Note   Date:  04/20/2019   ID:  [name], DOB 11-Nov-1940, MRN [id number]  PCP:  Veronica Showers, MD  Cardiologist:  [name]  No chief complaint on file.    History of Present Illness: [name] is a 78 y.o. female who presents for ongoing assessment and management of atrial fibrillation, with other history to include hyperlipidemia, rheumatoid arthritis, lymphocytic colitis and GERD.  The patient was referred to our office by [name] and seen by [name] on 04/03/2019.  On that evaluation, the patient was found to be asymptomatic, heart rate was 100 bpm.  She was started on Eliquis 2.5 mg twice daily, metoprolol 25 mg daily twice daily for heart rate controlled.  If she continued to be in atrial fibrillation on follow-up a cardioversion would be scheduled after completing 3 weeks of anticoagulation.  In the interim an echocardiogram was ordered.    Past Medical History:  Diagnosis Date  . Allergy    seasonal  . Anemia   . Cataract    bil cateracts removed  . Chronic constipation   . Diarrhea    chronic diarrheafor over 2 months  . GERD (gastroesophageal reflux disease)   . Hyperlipidemia   . 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 Rheumatoid arthritis ([place])   . Status post dilation of esophageal narrowing 2014  . Wears glasses     Past Surgical History:  Procedure Laterality Date  . CATARACT EXTRACTION  06/30/2016 -right eye   left eye in 08/15/16  . COLONOSCOPY    . DILATION AND CURETTAGE OF UTERUS  2009  . ESOPHAGEAL DILATION  05/31/1997  . REPAIR EXTENSOR TENDON Right 05/14/2013   Procedure: LONG FINGER EXTENSOR TO ELEVATE RING AND SMALL FINGERS ;  Surgeon: [name]., MD;  Location: [facility];  Service: Orthopedics;  Laterality: Right;  . seborrheic keratosis  09/03/12   inflamed, excised from forehead  . TENDON TRANSFER Right 09/03/2013   Procedure: RIGHT HAND FLEXOR CARPI RADIALUS  TRANSFER TO LONG/RING/SMALL;  Surgeon: [name]., MD;  Location: [facility];  Service: Orthopedics;  Laterality: Right;  . TONSILLECTOMY    . TUBAL LIGATION    . ULNAR HEAD EXCISION Right 05/14/2013   Procedure: ARTHOPLASTY DISTAL RADIAL ULNAR JOINT, RIGHT ;  Surgeon: [name]., MD;  Location: [facility];  Service: Orthopedics;  Laterality: Right;  . UPPER GASTROINTESTINAL ENDOSCOPY       Current Outpatient Medications  Medication Sig Dispense Refill  . acetaminophen (TYLENOL) 500 MG tablet Take 500 mg by mouth as needed.    Marland Kitchen apixaban (ELIQUIS) 2.5 MG TABS tablet Take 1 tablet (2.5 mg total) by mouth 2 (two) times daily. 180 tablet 3  . Biotin (BIOTIN 5000) 5 MG CAPS Take by mouth daily.    . budesonide (ENTOCORT EC) 3 MG 24 hr capsule Take 3 capsules (9 mg total) by mouth daily. 30 capsule 2  . calcium carbonate (TUMS EX) 750 MG chewable tablet Chew 1,500 mg by mouth.    . clobetasol (OLUX) 0.05 % topical foam APPLY TO SCALP ONCE DAILY  1  . diphenhydrAMINE (BENADRYL) 25 MG tablet Take 25 mg by mouth as needed.    Marland Kitchen esomeprazole (NEXIUM) 40 MG capsule TAKE 1 CAPSULE(40 MG) BY MOUTH TWICE DAILY BEFORE A MEAL 60 capsule 11  . glucosamine-chondroitin 500-400 MG tablet Take by mouth.    . InFLIXimab (REMICADE IV)  Inject into the vein. Remicade Infusion every 4 months    . metoprolol tartrate (LOPRESSOR) 25 MG tablet Take 1 tablet (25 mg total) by mouth 2 (two) times daily. 180 tablet 3  . Multiple Vitamins-Minerals (CENTRUM SILVER PO) Take 1 tablet by mouth daily.      . Multiple Vitamins-Minerals (EYE VITAMINS) CAPS Take by mouth.    . rosuvastatin (CRESTOR) 5 MG tablet TAKE ONE TABLET BY MOUTH ONCE A WEEK 12 tablet 3  . SOOLANTRA 1 % CREA APPLY ON THE AFFECTED AREA OF SKIN AS DIRECTED.  1  . VITAMIN D, CHOLECALCIFEROL, PO Take 5,000 Units by mouth 3 (three) times a week.    . vitamin E 400 UNIT capsule Take 400 Units by mouth daily.     No  current facility-administered medications for this visit.    Allergies:   Celebrex [celecoxib]    Social History:  The patient  reports that she has never smoked. She has never used smokeless tobacco. She reports current alcohol use of about 21.0 standard drinks of alcohol per week. She reports that she does not use drugs.   Family History:  The patient's family history includes COPD in her father; Colon cancer in her paternal uncle; Heart disease in her mother; Osteoporosis in her maternal aunt, maternal grandmother, and mother.    ROS: All other systems are reviewed and negative. Unless otherwise mentioned in H&P    PHYSICAL EXAM: VS:  There were no vitals taken for this visit. , BMI There is no height or weight on file to calculate BMI. GEN: Well nourished, well developed, in no acute distress HEENT: normal Neck: no JVD, carotid bruits, or masses Cardiac: ***RRR; no murmurs, rubs, or gallops,no edema  Respiratory:  Clear to auscultation bilaterally, normal work of breathing GI: soft, nontender, nondistended, + BS MS: no deformity or atrophy Skin: warm and dry, no rash Neuro:  Strength and sensation are intact Psych: euthymic mood, full affect   EKG:  EKG {ACTION; IS/IS [id number]} ordered today. The ekg ordered today demonstrates ***   Recent Labs: 03/10/2019: ALT 15; BUN 13; Creat 0.57; Hemoglobin 14.0; Platelets 187; Potassium 3.8; Sodium 141; TSH 2.45    Lipid Panel    Component Value Date/Time   CHOL 270 (H) 03/10/2019 0901   TRIG 87 03/10/2019 0901   HDL 118 03/10/2019 0901   CHOLHDL 2.3 03/10/2019 0901   VLDL 19 08/03/2016 1001   LDLCALC 133 (H) 03/10/2019 0901   LDLDIRECT 149 (H) 11/11/2007 2043      Wt Readings from Last 3 Encounters:  04/03/19 124 lb (56.2 kg)  03/17/19 123 lb (55.8 kg)  01/06/19 124 lb 4 oz (56.4 kg)      Other studies Reviewed: Additional studies/ records that were reviewed today include: ***. Review of the above records  demonstrates: ***   ASSESSMENT AND PLAN:  1.  ***   Current medicines are reviewed at length with the patient today.    Labs/ tests ordered today include: *** [name], ANP, AACC   04/20/2019 9:11 AM    [place] [address] Office [phone] Fax [phone]  Notice: This dictation was prepared with Dragon dictation along with smaller phrase technology. Any transcriptional errors that result from this process are unintentional and may not be corrected upon review.

## 2019-04-21 ENCOUNTER — Telehealth: Payer: Self-pay | Admitting: Cardiology

## 2019-04-21 NOTE — Telephone Encounter (Signed)
Patient would like her appt with Jory Sims on 04/23/19 to be virtual. States you can leave a detailed message if she does not answer.

## 2019-04-21 NOTE — Telephone Encounter (Signed)
Returned call to patient left message on personal voice mail appointment with [name] [facility] 12/24 cancelled.Virtual appointment scheduled with Veronica Sims DNP 12/29 at 9:15 am.

## 2019-04-22 ENCOUNTER — Other Ambulatory Visit (HOSPITAL_COMMUNITY): Payer: Medicare Other

## 2019-04-23 ENCOUNTER — Ambulatory Visit: Payer: Medicare Other | Admitting: Adult Health

## 2019-04-28 ENCOUNTER — Telehealth: Payer: Medicare Other | Admitting: Adult Health

## 2019-04-30 ENCOUNTER — Other Ambulatory Visit: Payer: Self-pay

## 2019-04-30 ENCOUNTER — Ambulatory Visit (HOSPITAL_COMMUNITY): Payer: Medicare Other | Attending: Cardiovascular Disease

## 2019-04-30 DIAGNOSIS — I4891 Unspecified atrial fibrillation: Secondary | ICD-10-CM | POA: Insufficient documentation

## 2019-05-04 NOTE — H&P (View-Only) (Signed)
Cardiology Office Note   Date:  05/06/2019   ID:  [name], DOB 02/06/1941, MRN [id number]  PCP:  [name], MD  Cardiologist:  Dr. Gardiner Rhyme  CC: Atrial fib follow up   History of Present Illness: [name] is a 79 y.o. female who presents we are following for ongoing assessment and management of atrial fibrillation CHADS VASC Score of 3, recently placed on Eliquis 2.5 mg and metoprolol BID for rate control by   Dr.Shumann on initial encounter on 04/03/2019. Because this was a new onset AF the plan was to restore sinus rhythm with DCCV after being on anticoagulation for 3 weeks.  She is to be seen for EKG today to determine if she is continues in NSR.   [name] comes today without any cardiac complaints.  She denies any bleeding issues on Eliquis, dizziness or dyspnea.  She does state that her energy level is at baseline.  She has continued to take her Eliquis every day without missing doses since 04/03/2019.   Past Medical History:  Diagnosis Date  . Allergy    seasonal  . Anemia   . Cataract    bil cateracts removed  . Chronic constipation   . Diarrhea    chronic diarrheafor over 2 months  . GERD (gastroesophageal reflux disease)   . Hyperlipidemia   . 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 Rheumatoid arthritis ([place])   . Status post dilation of esophageal narrowing 2014  . Wears glasses     Past Surgical History:  Procedure Laterality Date  . CATARACT EXTRACTION  06/30/2016 -right eye   left eye in 08/15/16  . COLONOSCOPY    . DILATION AND CURETTAGE OF UTERUS  2009  . ESOPHAGEAL DILATION  05/31/1997  . REPAIR EXTENSOR TENDON Right 05/14/2013   Procedure: LONG FINGER EXTENSOR TO ELEVATE RING AND SMALL FINGERS ;  Surgeon: Cammie Sickle., MD;  Location: [facility];  Service: Orthopedics;  Laterality: Right;  . seborrheic keratosis  09/03/12   inflamed, excised from forehead  . TENDON TRANSFER Right 09/03/2013   Procedure: RIGHT HAND FLEXOR CARPI RADIALUS TRANSFER TO LONG/RING/SMALL;  Surgeon: Cammie Sickle., MD;  Location: [facility];  Service: Orthopedics;  Laterality: Right;  . TONSILLECTOMY    . TUBAL LIGATION    . ULNAR HEAD EXCISION Right 05/14/2013   Procedure: ARTHOPLASTY DISTAL RADIAL ULNAR JOINT, RIGHT ;  Surgeon: Cammie Sickle., MD;  Location: [facility];  Service: Orthopedics;  Laterality: Right;  . UPPER GASTROINTESTINAL ENDOSCOPY       Current Outpatient Medications  Medication Sig Dispense Refill  . acetaminophen (TYLENOL) 500 MG tablet Take 500 mg by mouth as needed.    Marland Kitchen apixaban (ELIQUIS) 2.5 MG TABS tablet Take 1 tablet (2.5 mg total) by mouth 2 (two) times daily. 180 tablet 3  . Biotin (BIOTIN 5000) 5 MG CAPS Take by mouth daily.    . budesonide (ENTOCORT EC) 3 MG 24 hr capsule Take 3 capsules (9 mg total) by mouth daily. 30 capsule 2  . calcium carbonate (TUMS EX) 750 MG chewable tablet Chew 1,500 mg by mouth.    . clobetasol (OLUX) 0.05 % topical foam APPLY TO SCALP ONCE DAILY  1  . diphenhydrAMINE (BENADRYL) 25 MG tablet Take 25 mg by mouth as needed.    Marland Kitchen esomeprazole (NEXIUM) 40 MG capsule TAKE 1 CAPSULE(40 MG) BY MOUTH TWICE DAILY BEFORE A MEAL 60  capsule 11  . glucosamine-chondroitin 500-400 MG tablet Take by mouth.    . InFLIXimab (REMICADE IV) Inject into the vein. Remicade Infusion every 4 months    . metoprolol tartrate (LOPRESSOR) 25 MG tablet Take 1 tablet (25 mg total) by mouth 2 (two) times daily. 180 tablet 3  . Multiple Vitamins-Minerals (CENTRUM SILVER PO) Take 1 tablet by mouth daily.      . Multiple Vitamins-Minerals (EYE VITAMINS) CAPS Take by mouth.    . rosuvastatin (CRESTOR) 5 MG tablet TAKE ONE TABLET BY MOUTH ONCE A WEEK 12 tablet 3  . SOOLANTRA 1 % CREA APPLY ON THE AFFECTED AREA OF SKIN AS DIRECTED.  1  . VITAMIN D, CHOLECALCIFEROL, PO Take 5,000 Units by mouth 3 (three) times a week.    . vitamin E 400 UNIT  capsule Take 400 Units by mouth daily.     No current facility-administered medications for this visit.    Allergies:   Celebrex [celecoxib]    Social History:  The patient  reports that she has never smoked. She has never used smokeless tobacco. She reports current alcohol use of about 21.0 standard drinks of alcohol per week. She reports that she does not use drugs.   Family History:  The patient's family history includes COPD in her father; Colon cancer in her paternal uncle; Heart disease in her mother; Osteoporosis in her maternal aunt, maternal grandmother, and mother.    ROS: All other systems are reviewed and negative. Unless otherwise mentioned in H&P    PHYSICAL EXAM: VS:  BP 100/62   Pulse 81   Ht 5' 4"  (1.626 m)   Wt 125 lb (56.7 kg)   BMI 21.46 kg/m  , BMI Body mass index is 21.46 kg/m. GEN: Well nourished, well developed, in no acute distress HEENT: normal Neck: no JVD, carotid bruits, or masses Cardiac:IRRR; soft systolic murmurs, rubs, or gallops,no edema  Respiratory:  Clear to auscultation bilaterally, normal work of breathing GI: soft, nontender, nondistended, + BS MS: no deformity or atrophy Skin: warm and dry, no rash Neuro:  Strength and sensation are intact Psych: euthymic mood, full affect   EKG: Atrial fibrillation, heart rate 81 bpm,  Recent Labs: 03/10/2019: ALT 15; BUN 13; Creat 0.57; Hemoglobin 14.0; Platelets 187; Potassium 3.8; Sodium 141; TSH 2.45    Lipid Panel    Component Value Date/Time   CHOL 270 (H) 03/10/2019 0901   TRIG 87 03/10/2019 0901   HDL 118 03/10/2019 0901   CHOLHDL 2.3 03/10/2019 0901   VLDL 19 08/03/2016 1001   LDLCALC 133 (H) 03/10/2019 0901   LDLDIRECT 149 (H) 11/11/2007 2043      Wt Readings from Last 3 Encounters:  05/06/19 125 lb (56.7 kg)  04/03/19 124 lb (56.2 kg)  03/17/19 123 lb (55.8 kg)      Other studies Reviewed: Echocardiogram 05-25-19  1. Left ventricular ejection fraction, by visual  estimation, is 60 to 65%. The left ventricle has normal function. Left ventricular septal wall thickness was normal. Normal left ventricular posterior wall thickness. There is no left ventricular  hypertrophy.  2. Left ventricular diastolic parameters are indeterminate.  3. The left ventricle has no regional wall motion abnormalities.  4. Global right ventricle has normal systolic function.The right ventricular size is normal. No increase in right ventricular wall thickness.  5. Left atrial size was severely dilated.  6. Right atrial size was mildly dilated.  7. The mitral valve is normal in structure. Mild mitral valve regurgitation.  No evidence of mitral stenosis.  8. The tricuspid valve is normal in structure.  9. The aortic valve is tricuspid. Aortic valve regurgitation is mild. No evidence of aortic valve sclerosis or stenosis. 10. The pulmonic valve was normal in structure. Pulmonic valve regurgitation is not visualized. 11. Normal pulmonary artery systolic pressure. 12. The inferior vena cava is dilated in size with >50% respiratory variability, suggesting right atrial pressure of 8 mmHg.   ASSESSMENT AND PLAN:  1.  Atrial fibrillation: [name] continues to be in atrial fibrillation, rate is controlled.  She has been compliant with Eliquis, and has not missed any doses.  I have reviewed her above echocardiogram that did show that her left atrial size was severely dilated.  However she is willing to proceed with a DCCV to see if she could be restored normal sinus rhythm.  I have explained to her that she may not convert to normal sinus rhythm long-term however she is on the appropriate medications for heart rate control and to avoid thromboembolism.  I have reviewed the cardioversion procedure, risks and benefits, and the patient is willing to proceed.  She is scheduled for Monday, May 11, 2019 with Dr. Harrell Gave.  They are aware that she will have to come alone to the endoscopy  lab and her husband will not be able to accompany her.  I have answered all of her questions and that of her husband.  Follow-up with Dr. Gardiner Rhyme post procedure for further recommendations.  2. Hyperlipidemia: She continues on rosuvastatin 5 mg once a week.  Labs are completed per PCP.    Current medicines are reviewed at length with the patient today. I have spent  (25  ) minutes dedicated to the care of this patient on the date of this encounter to include pre-visit review of records, assessment, management and diagnostic testing,with shared decision making.  Labs/ tests ordered today include: DCCV Pre-procedure labs.  Phill Myron. West Pugh, ANP, AACC   05/06/2019 4:44 PM    [place] [employer] [address] Office [phone] Fax [phone]  Notice: This dictation was prepared with Dragon dictation along with smaller phrase technology. Any transcriptional errors that result from this process are unintentional and may not be corrected upon review.

## 2019-05-04 NOTE — Progress Notes (Signed)
Cardiology Office Note   Date:  05/06/2019   ID:  [name], DOB 1941/04/12, MRN [id number]  PCP:  [name], MD  Cardiologist:  Dr. Gardiner Rhyme  CC: Atrial fib follow up   History of Present Illness: [name] is a 79 y.o. female who presents we are following for ongoing assessment and management of atrial fibrillation CHADS VASC Score of 3, recently placed on Eliquis 2.5 mg and metoprolol BID for rate control by   Dr.Shumann on initial encounter on 04/03/2019. Because this was a new onset AF the plan was to restore sinus rhythm with DCCV after being on anticoagulation for 3 weeks.  She is to be seen for EKG today to determine if she is continues in NSR.   [name] comes today without any cardiac complaints.  She denies any bleeding issues on Eliquis, dizziness or dyspnea.  She does state that her energy level is at baseline.  She has continued to take her Eliquis every day without missing doses since 04/03/2019.   Past Medical History:  Diagnosis Date  . Allergy    seasonal  . Anemia   . Cataract    bil cateracts removed  . Chronic constipation   . Diarrhea    chronic diarrheafor over 2 months  . GERD (gastroesophageal reflux disease)   . Hyperlipidemia   . 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 Rheumatoid arthritis ([place])   . Status post dilation of esophageal narrowing 2014  . Wears glasses     Past Surgical History:  Procedure Laterality Date  . CATARACT EXTRACTION  06/30/2016 -right eye   left eye in 08/15/16  . COLONOSCOPY    . DILATION AND CURETTAGE OF UTERUS  2009  . ESOPHAGEAL DILATION  05/31/1997  . REPAIR EXTENSOR TENDON Right 05/14/2013   Procedure: LONG FINGER EXTENSOR TO ELEVATE RING AND SMALL FINGERS ;  Surgeon: Cammie Sickle., MD;  Location: [facility];  Service: Orthopedics;  Laterality: Right;  . seborrheic keratosis  09/03/12   inflamed, excised from forehead  . TENDON TRANSFER Right 09/03/2013   Procedure: RIGHT HAND FLEXOR CARPI RADIALUS TRANSFER TO LONG/RING/SMALL;  Surgeon: Cammie Sickle., MD;  Location: [facility];  Service: Orthopedics;  Laterality: Right;  . TONSILLECTOMY    . TUBAL LIGATION    . ULNAR HEAD EXCISION Right 05/14/2013   Procedure: ARTHOPLASTY DISTAL RADIAL ULNAR JOINT, RIGHT ;  Surgeon: Cammie Sickle., MD;  Location: [facility];  Service: Orthopedics;  Laterality: Right;  . UPPER GASTROINTESTINAL ENDOSCOPY       Current Outpatient Medications  Medication Sig Dispense Refill  . acetaminophen (TYLENOL) 500 MG tablet Take 500 mg by mouth as needed.    Marland Kitchen apixaban (ELIQUIS) 2.5 MG TABS tablet Take 1 tablet (2.5 mg total) by mouth 2 (two) times daily. 180 tablet 3  . Biotin (BIOTIN 5000) 5 MG CAPS Take by mouth daily.    . budesonide (ENTOCORT EC) 3 MG 24 hr capsule Take 3 capsules (9 mg total) by mouth daily. 30 capsule 2  . calcium carbonate (TUMS EX) 750 MG chewable tablet Chew 1,500 mg by mouth.    . clobetasol (OLUX) 0.05 % topical foam APPLY TO SCALP ONCE DAILY  1  . diphenhydrAMINE (BENADRYL) 25 MG tablet Take 25 mg by mouth as needed.    Marland Kitchen esomeprazole (NEXIUM) 40 MG capsule TAKE 1 CAPSULE(40 MG) BY MOUTH TWICE DAILY BEFORE A MEAL 60  capsule 11  . glucosamine-chondroitin 500-400 MG tablet Take by mouth.    . InFLIXimab (REMICADE IV) Inject into the vein. Remicade Infusion every 4 months    . metoprolol tartrate (LOPRESSOR) 25 MG tablet Take 1 tablet (25 mg total) by mouth 2 (two) times daily. 180 tablet 3  . Multiple Vitamins-Minerals (CENTRUM SILVER PO) Take 1 tablet by mouth daily.      . Multiple Vitamins-Minerals (EYE VITAMINS) CAPS Take by mouth.    . rosuvastatin (CRESTOR) 5 MG tablet TAKE ONE TABLET BY MOUTH ONCE A WEEK 12 tablet 3  . SOOLANTRA 1 % CREA APPLY ON THE AFFECTED AREA OF SKIN AS DIRECTED.  1  . VITAMIN D, CHOLECALCIFEROL, PO Take 5,000 Units by mouth 3 (three) times a week.    . vitamin E 400 UNIT  capsule Take 400 Units by mouth daily.     No current facility-administered medications for this visit.    Allergies:   Celebrex [celecoxib]    Social History:  The patient  reports that she has never smoked. She has never used smokeless tobacco. She reports current alcohol use of about 21.0 standard drinks of alcohol per week. She reports that she does not use drugs.   Family History:  The patient's family history includes COPD in her father; Colon cancer in her paternal uncle; Heart disease in her mother; Osteoporosis in her maternal aunt, maternal grandmother, and mother.    ROS: All other systems are reviewed and negative. Unless otherwise mentioned in H&P    PHYSICAL EXAM: VS:  BP 100/62   Pulse 81   Ht 5' 4"  (1.626 m)   Wt 125 lb (56.7 kg)   BMI 21.46 kg/m  , BMI Body mass index is 21.46 kg/m. GEN: Well nourished, well developed, in no acute distress HEENT: normal Neck: no JVD, carotid bruits, or masses Cardiac:IRRR; soft systolic murmurs, rubs, or gallops,no edema  Respiratory:  Clear to auscultation bilaterally, normal work of breathing GI: soft, nontender, nondistended, + BS MS: no deformity or atrophy Skin: warm and dry, no rash Neuro:  Strength and sensation are intact Psych: euthymic mood, full affect   EKG: Atrial fibrillation, heart rate 81 bpm,  Recent Labs: 03/10/2019: ALT 15; BUN 13; Creat 0.57; Hemoglobin 14.0; Platelets 187; Potassium 3.8; Sodium 141; TSH 2.45    Lipid Panel    Component Value Date/Time   CHOL 270 (H) 03/10/2019 0901   TRIG 87 03/10/2019 0901   HDL 118 03/10/2019 0901   CHOLHDL 2.3 03/10/2019 0901   VLDL 19 08/03/2016 1001   LDLCALC 133 (H) 03/10/2019 0901   LDLDIRECT 149 (H) 11/11/2007 2043      Wt Readings from Last 3 Encounters:  05/06/19 125 lb (56.7 kg)  04/03/19 124 lb (56.2 kg)  03/17/19 123 lb (55.8 kg)      Other studies Reviewed: Echocardiogram 05-28-2019  1. Left ventricular ejection fraction, by visual  estimation, is 60 to 65%. The left ventricle has normal function. Left ventricular septal wall thickness was normal. Normal left ventricular posterior wall thickness. There is no left ventricular  hypertrophy.  2. Left ventricular diastolic parameters are indeterminate.  3. The left ventricle has no regional wall motion abnormalities.  4. Global right ventricle has normal systolic function.The right ventricular size is normal. No increase in right ventricular wall thickness.  5. Left atrial size was severely dilated.  6. Right atrial size was mildly dilated.  7. The mitral valve is normal in structure. Mild mitral valve regurgitation.  No evidence of mitral stenosis.  8. The tricuspid valve is normal in structure.  9. The aortic valve is tricuspid. Aortic valve regurgitation is mild. No evidence of aortic valve sclerosis or stenosis. 10. The pulmonic valve was normal in structure. Pulmonic valve regurgitation is not visualized. 11. Normal pulmonary artery systolic pressure. 12. The inferior vena cava is dilated in size with >50% respiratory variability, suggesting right atrial pressure of 8 mmHg.   ASSESSMENT AND PLAN:  1.  Atrial fibrillation: [name] continues to be in atrial fibrillation, rate is controlled.  She has been compliant with Eliquis, and has not missed any doses.  I have reviewed her above echocardiogram that did show that her left atrial size was severely dilated.  However she is willing to proceed with a DCCV to see if she could be restored normal sinus rhythm.  I have explained to her that she may not convert to normal sinus rhythm long-term however she is on the appropriate medications for heart rate control and to avoid thromboembolism.  I have reviewed the cardioversion procedure, risks and benefits, and the patient is willing to proceed.  She is scheduled for Monday, May 11, 2019 with Dr. Harrell Gave.  They are aware that she will have to come alone to the endoscopy  lab and her husband will not be able to accompany her.  I have answered all of her questions and that of her husband.  Follow-up with Dr. Gardiner Rhyme post procedure for further recommendations.  2. Hyperlipidemia: She continues on rosuvastatin 5 mg once a week.  Labs are completed per PCP.    Current medicines are reviewed at length with the patient today. I have spent  (25  ) minutes dedicated to the care of this patient on the date of this encounter to include pre-visit review of records, assessment, management and diagnostic testing,with shared decision making.  Labs/ tests ordered today include: DCCV Pre-procedure labs.  Phill Myron. West Pugh, ANP, AACC   05/06/2019 4:44 PM    [place] [address] [address] Office [phone] Fax [phone]  Notice: This dictation was prepared with Dragon dictation along with smaller phrase technology. Any transcriptional errors that result from this process are unintentional and may not be corrected upon review.

## 2019-05-05 DIAGNOSIS — M25572 Pain in left ankle and joints of left foot: Secondary | ICD-10-CM | POA: Diagnosis not present

## 2019-05-05 DIAGNOSIS — M150 Primary generalized (osteo)arthritis: Secondary | ICD-10-CM | POA: Diagnosis not present

## 2019-05-05 DIAGNOSIS — Z6821 Body mass index (BMI) 21.0-21.9, adult: Secondary | ICD-10-CM | POA: Diagnosis not present

## 2019-05-05 DIAGNOSIS — M0609 Rheumatoid arthritis without rheumatoid factor, multiple sites: Secondary | ICD-10-CM | POA: Diagnosis not present

## 2019-05-06 ENCOUNTER — Other Ambulatory Visit: Payer: Self-pay

## 2019-05-06 ENCOUNTER — Other Ambulatory Visit: Payer: Self-pay | Admitting: Adult Health

## 2019-05-06 ENCOUNTER — Encounter: Payer: Self-pay | Admitting: Adult Health

## 2019-05-06 ENCOUNTER — Ambulatory Visit: Payer: Medicare Other | Admitting: Adult Health

## 2019-05-06 VITALS — BP 100/62 | HR 81 | Ht 64.0 in | Wt 125.0 lb

## 2019-05-06 DIAGNOSIS — Z01812 Encounter for preprocedural laboratory examination: Secondary | ICD-10-CM | POA: Diagnosis not present

## 2019-05-06 DIAGNOSIS — I4891 Unspecified atrial fibrillation: Secondary | ICD-10-CM

## 2019-05-06 DIAGNOSIS — E7800 Pure hypercholesterolemia, unspecified: Secondary | ICD-10-CM | POA: Diagnosis not present

## 2019-05-06 NOTE — Patient Instructions (Signed)
Medication Instructions:  Continue current medications  *If you need a refill on your cardiac medications before your next appointment, please call your pharmacy*  Lab Work: CBC and BMP Friday If you have labs (blood work) drawn today and your tests are completely normal, you will receive your results only by: Marland Kitchen MyChart Message (if you have MyChart) OR . A paper copy in the mail If you have any lab test that is abnormal or we need to change your treatment, we will call you to review the results.  Testing/Procedures: Your physician has recommended that you have a Cardioversion (DCCV). Electrical Cardioversion uses a jolt of electricity to your heart either through paddles or wired patches attached to your chest. This is a controlled, usually prescheduled, procedure. Defibrillation is done under light anesthesia in the hospital, and you usually go home the day of the procedure. This is done to get your heart back into a normal rhythm. You are not awake for the procedure. Please see the instruction sheet given to you today.  Follow-Up: At [facility], you and your health needs are our priority.  As part of our continuing mission to provide you with exceptional heart care, we have created designated Provider Care Teams.  These Care Teams include your primary Cardiologist (physician) and Advanced Practice Providers (APPs -  Physician Assistants and Nurse Practitioners) who all work together to provide you with the care you need, when you need it.  Your next appointment:   2 weeks after Cardioversion  Other Instructions Dear [name]   You are scheduled for a Cardioversion on Monday January 11th with Dr.  Harrell Gave.  Please arrive at the [facility] at [facility]: [address] [place], [place] [address] at 7:30 am. (1 hour prior to procedure unless lab work is needed; if lab work is needed arrive 1.5 hours ahead)  DIET: Nothing to eat or drink after  midnight except a sip of water with medications (see medication instructions below)  Medication Instructions: Continue your anticoagulant: Eliquis You will need to continue your anticoagulant after your procedure until you are told by your Provider that it is safe to stop   Come to: [place] [address] between the hours of 8:00 am-4:30 pm  Labs: CBC and BMP  COVID19 TEST: Friday January 8th   This is a Drive Up Visit at the [employer] [address] [place]. Someone will direct you to the appropriate testing line. Stay in your car and someone will be with you shortly.  Come to: [place] [address] between the hours of 8:00 am-4:30 pm  You must have a responsible person to drive you home and stay in the waiting area during your procedure. Failure to do so could result in cancellation.  Bring your insurance cards.  *Special Note: Every effort is made to have your procedure done on time. Occasionally there are emergencies that occur at the hospital that may cause delays. Please be patient if a delay does occur.

## 2019-05-08 ENCOUNTER — Other Ambulatory Visit (HOSPITAL_COMMUNITY)
Admission: RE | Admit: 2019-05-08 | Discharge: 2019-05-08 | Disposition: A | Discharge status: Home / Self Care | Payer: Medicare PPO | Source: Ambulatory Visit | Attending: Cardiology | Admitting: Cardiology

## 2019-05-08 DIAGNOSIS — Z01812 Encounter for preprocedural laboratory examination: Secondary | ICD-10-CM | POA: Diagnosis not present

## 2019-05-08 DIAGNOSIS — Z20822 Contact with and (suspected) exposure to covid-19: Secondary | ICD-10-CM | POA: Diagnosis not present

## 2019-05-08 DIAGNOSIS — I4891 Unspecified atrial fibrillation: Secondary | ICD-10-CM | POA: Diagnosis not present

## 2019-05-08 LAB — BASIC METABOLIC PANEL
BUN/Creatinine Ratio: 20 (ref 12–28)
BUN: 15 mg/dL (ref 8–27)
CO2: 25 mmol/L (ref 20–29)
Calcium: 9.5 mg/dL (ref 8.7–10.3)
Chloride: 101 mmol/L (ref 96–106)
Creatinine, Ser: 0.76 mg/dL (ref 0.57–1.00)
GFR calc Af Amer: 86 mL/min/{1.73_m2} (ref >59)
GFR calc non Af Amer: 74 mL/min/{1.73_m2} (ref >59)
Glucose: 114 mg/dL — ABNORMAL HIGH (ref 65–99)
Potassium: 4.1 mmol/L (ref 3.5–5.2)
Sodium: 141 mmol/L (ref 134–144)

## 2019-05-08 LAB — CBC
Hematocrit: 45.5 % (ref 34.0–46.6)
Hemoglobin: 15.7 g/dL (ref 11.1–15.9)
MCH: 33.5 pg — ABNORMAL HIGH (ref 26.6–33.0)
MCHC: 34.5 g/dL (ref 31.5–35.7)
MCV: 97 fL (ref 79–97)
Platelets: 223 10*3/uL (ref 150–450)
RBC: 4.69 x10E6/uL (ref 3.77–5.28)
RDW: 12.4 % (ref 11.7–15.4)
WBC: 6.2 10*3/uL (ref 3.4–10.8)

## 2019-05-08 LAB — SARS CORONAVIRUS 2 (TAT 6-24 HRS): SARS Coronavirus 2: NEGATIVE

## 2019-05-08 NOTE — Progress Notes (Signed)
Pre-op call done, patient to get COVID tested this afternoon (05/08/19) . She states she will remain quarantine over weekend. Verified has not missed any doses of Eliquis and will take it before arrival to hospital. All questions addressed. Patient to arrive by 0630 05/11/19.

## 2019-05-11 ENCOUNTER — Ambulatory Visit (HOSPITAL_COMMUNITY)
Admission: RE | Admit: 2019-05-11 | Discharge: 2019-05-11 | Disposition: A | Discharge status: Home / Self Care | Payer: Medicare PPO | Attending: Cardiology | Admitting: Cardiology

## 2019-05-11 ENCOUNTER — Ambulatory Visit (HOSPITAL_COMMUNITY): Payer: Medicare PPO | Admitting: Anesthesiology

## 2019-05-11 ENCOUNTER — Encounter (HOSPITAL_COMMUNITY)
Admission: RE | Disposition: A | Discharge status: Home / Self Care | Payer: Self-pay | Source: Home / Self Care | Attending: Cardiology

## 2019-05-11 ENCOUNTER — Encounter (HOSPITAL_COMMUNITY): Payer: Self-pay | Admitting: Cardiology

## 2019-05-11 ENCOUNTER — Other Ambulatory Visit: Payer: Self-pay

## 2019-05-11 DIAGNOSIS — M069 Rheumatoid arthritis, unspecified: Secondary | ICD-10-CM | POA: Insufficient documentation

## 2019-05-11 DIAGNOSIS — K219 Gastro-esophageal reflux disease without esophagitis: Secondary | ICD-10-CM | POA: Diagnosis not present

## 2019-05-11 DIAGNOSIS — Z886 Allergy status to analgesic agent status: Secondary | ICD-10-CM | POA: Insufficient documentation

## 2019-05-11 DIAGNOSIS — Z7952 Long term (current) use of systemic steroids: Secondary | ICD-10-CM | POA: Diagnosis not present

## 2019-05-11 DIAGNOSIS — R001 Bradycardia, unspecified: Secondary | ICD-10-CM | POA: Diagnosis not present

## 2019-05-11 DIAGNOSIS — I4819 Other persistent atrial fibrillation: Secondary | ICD-10-CM | POA: Diagnosis not present

## 2019-05-11 DIAGNOSIS — E785 Hyperlipidemia, unspecified: Secondary | ICD-10-CM | POA: Diagnosis not present

## 2019-05-11 DIAGNOSIS — Z79899 Other long term (current) drug therapy: Secondary | ICD-10-CM | POA: Insufficient documentation

## 2019-05-11 DIAGNOSIS — Z7901 Long term (current) use of anticoagulants: Secondary | ICD-10-CM | POA: Diagnosis not present

## 2019-05-11 DIAGNOSIS — I4891 Unspecified atrial fibrillation: Secondary | ICD-10-CM | POA: Insufficient documentation

## 2019-05-11 HISTORY — PX: CARDIOVERSION: SHX1299

## 2019-05-11 SURGERY — CARDIOVERSION
Anesthesia: General

## 2019-05-11 MED ORDER — PROPOFOL 10 MG/ML IV BOLUS
INTRAVENOUS | Status: DC | PRN
  Administered 2019-05-11: 40 mg via INTRAVENOUS

## 2019-05-11 MED ORDER — SODIUM CHLORIDE 0.9 % IV SOLN: INTRAVENOUS | Status: DC

## 2019-05-11 MED ORDER — LIDOCAINE 2% (20 MG/ML) 5 ML SYRINGE
INTRAMUSCULAR | Status: DC | PRN
  Administered 2019-05-11: 60 mg via INTRAVENOUS

## 2019-05-11 NOTE — CV Procedure (Signed)
Procedure:   DCCV  Indication:  Symptomatic atrial fibrillation  Procedure Note:  The patient signed informed consent.  They have had had therapeutic anticoagulation with apixaban greater than 3 weeks.  Anesthesia was administered by Dr. Marcie Bal.  Adequate airway was maintained throughout and vital followed per protocol.  They were cardioverted x 1 with 120J of biphasic synchronized energy.  They converted to sinus bradycardia.  There were no apparent complications.  The patient had normal neuro status and respiratory status post procedure with vitals stable as recorded elsewhere.    Follow up:  They will continue on current medical therapy and follow up with cardiology as scheduled.  Buford Dresser, MD PhD 05/11/2019 7:52 AM

## 2019-05-11 NOTE — Transfer of Care (Signed)
Immediate Anesthesia Transfer of Care Note  Patient: [name]  Procedure(s) Performed: CARDIOVERSION (N/A )  Patient Location: Endoscopy Unit  Anesthesia Type:General  Level of Consciousness: drowsy  Airway & Oxygen Therapy: Patient Spontanous Breathing  Post-op Assessment: Report given to RN and Post -op Vital signs reviewed and stable  Post vital signs: Reviewed and stable  Last Vitals:  Vitals Value Taken Time  BP    Temp    Pulse    Resp    SpO2      Last Pain:  Vitals:   05/11/19 0655  TempSrc: Oral  PainSc: 0-No pain         Complications: No apparent anesthesia complications

## 2019-05-11 NOTE — Anesthesia Procedure Notes (Signed)
Procedure Name: General with mask airway Date/Time: 05/11/2019 7:44 AM Performed by: Candis Shine, CRNA Pre-anesthesia Checklist: Patient identified, Emergency Drugs available, Suction available, Patient being monitored and Timeout performed Patient Re-evaluated:Patient Re-evaluated prior to induction Oxygen Delivery Method: Ambu bag Preoxygenation: Pre-oxygenation with 100% oxygen Induction Type: IV induction Dental Injury: Teeth and Oropharynx as per pre-operative assessment

## 2019-05-11 NOTE — Anesthesia Postprocedure Evaluation (Signed)
Anesthesia Post Note  Patient: [name]  Procedure(s) Performed: CARDIOVERSION (N/A )     Patient location during evaluation: Endoscopy Anesthesia Type: General Level of consciousness: awake and alert Pain management: pain level controlled Vital Signs Assessment: post-procedure vital signs reviewed and stable Respiratory status: spontaneous breathing, nonlabored ventilation, respiratory function stable and patient connected to nasal cannula oxygen Cardiovascular status: blood pressure returned to baseline and stable Postop Assessment: no apparent nausea or vomiting Anesthetic complications: no    Last Vitals:  Vitals:   05/11/19 0753 05/11/19 0754  BP:  (!) 114/58  Pulse: (!) 51 (!) 49  Resp: (!) 25 18  Temp:  (!) 36.3 C  SpO2: 96% 94%    Last Pain:  Vitals:   05/11/19 0754  TempSrc: Oral  PainSc:                  [place]

## 2019-05-11 NOTE — Anesthesia Preprocedure Evaluation (Signed)
Anesthesia Evaluation  Patient identified by MRN, date of birth, ID band Patient awake    Reviewed: Allergy & Precautions, H&P , NPO status , Patient's Chart, lab work & pertinent test results  Airway Mallampati: II   Neck ROM: full    Dental   Pulmonary neg pulmonary ROS,    breath sounds clear to auscultation       Cardiovascular + dysrhythmias Atrial Fibrillation  Rhythm:regular Rate:Normal     Neuro/Psych    GI/Hepatic GERD  ,H/o esophageal dilation   Endo/Other    Renal/GU      Musculoskeletal  (+) Arthritis , Rheumatoid disorders,    Abdominal   Peds  Hematology   Anesthesia Other Findings   Reproductive/Obstetrics                             Anesthesia Physical Anesthesia Plan  ASA: III  Anesthesia Plan: General   Post-op Pain Management:    Induction: Intravenous  PONV Risk Score and Plan: 3 and Treatment may vary due to age or medical condition  Airway Management Planned: Mask  Additional Equipment:   Intra-op Plan:   Post-operative Plan:   Informed Consent: I have reviewed the patients History and Physical, chart, labs and discussed the procedure including the risks, benefits and alternatives for the proposed anesthesia with the patient or authorized representative who has indicated his/her understanding and acceptance.       Plan Discussed with: CRNA, Anesthesiologist and Surgeon  Anesthesia Plan Comments:         Anesthesia Quick Evaluation

## 2019-05-11 NOTE — Interval H&P Note (Signed)
History and Physical Interval Note:  05/11/2019 7:26 AM  [name]  has presented today for surgery, with the diagnosis of atrial fibrillation.  The various methods of treatment have been discussed with the patient and family. After consideration of risks, benefits and other options for treatment, the patient has consented to  Procedure(s): CARDIOVERSION (N/A) as a surgical intervention.  The patient's history has been reviewed, patient examined, no change in status, stable for surgery.  I have reviewed the patient's chart and labs.  Questions were answered to the patient's satisfaction.     [name]

## 2019-05-13 ENCOUNTER — Encounter: Payer: Self-pay | Admitting: *Deleted

## 2019-05-15 DIAGNOSIS — Z1231 Encounter for screening mammogram for malignant neoplasm of breast: Secondary | ICD-10-CM | POA: Diagnosis not present

## 2019-05-15 LAB — HM MAMMOGRAPHY

## 2019-05-18 ENCOUNTER — Encounter: Payer: Self-pay | Admitting: Internal Medicine

## 2019-05-19 ENCOUNTER — Telehealth: Payer: Self-pay | Admitting: Internal Medicine

## 2019-05-19 ENCOUNTER — Telehealth: Payer: Self-pay | Admitting: Cardiology

## 2019-05-19 NOTE — Telephone Encounter (Signed)
Attempted to call pt regarding medication questions following her cardioversion and to address her SOB. Unable to reach patient. Phone rang then asked for "remote access number"

## 2019-05-19 NOTE — Telephone Encounter (Signed)
I do not see an upcoming appointment at Cardiology. Please have her call [facility] and ask for Dr. Gennette Pac nurse. That is the doctor she saw initially in the office for A-fib

## 2019-05-19 NOTE — Telephone Encounter (Signed)
[name] [phone]  [name] called to say that the heart shock went well, but she still has shortness of breat that is manageable. She is wandering does she need to stay on same medication, and is she supposed to follow up with heart doctor. Is she finished with everything. I let her know she might need office visit or virtual visit to discuss.

## 2019-05-19 NOTE — Telephone Encounter (Signed)
Patient is calling to inquire about whether she should continue to take Metroprolol and Eliquis medications or not following heart shock. Patient states she only has SOB when walking up and down stairs. Please call and advise.

## 2019-05-19 NOTE — Telephone Encounter (Signed)
Called and spoke with patient to let her know to call Cardiology, and gave he phone number. She verbalized understanding.

## 2019-05-20 NOTE — Telephone Encounter (Signed)
Spoke with pt and has noted since cardioversion thought would feel better 4 or 5 months ago could climb stairs 3 times without difficulty and now pt struggles climbing 1 time gets very winded Encouraged pt to continue to take Eliquis and  Metoprolol  Per pt HR is sometimes low while on phone HR dropped to 43 Encouraged pt to check HR and if notes under 50 to hold Metoprolol Will forward to Dr Gardiner Rhyme for review and recommendations ./cy

## 2019-05-20 NOTE — Telephone Encounter (Signed)
Can we decrease metoprolol dose to 12.5 mg BID?

## 2019-05-21 ENCOUNTER — Ambulatory Visit: Payer: Medicare PPO | Attending: Internal Medicine

## 2019-05-21 DIAGNOSIS — Z23 Encounter for immunization: Secondary | ICD-10-CM | POA: Insufficient documentation

## 2019-05-21 MED ORDER — METOPROLOL TARTRATE 25 MG PO TABS: 12.5000 mg | ORAL_TABLET | Freq: Two times a day (BID) | ORAL | 3 refills | Status: DC

## 2019-05-21 NOTE — Telephone Encounter (Signed)
Pt updated and verbalized understanding. New orders placed.

## 2019-05-21 NOTE — Progress Notes (Signed)
   Covid-19 Vaccination Clinic  Name:  [name]    MRN: [id number] DOB: 31-Dec-1940  05/21/2019  Ms. [name] was observed post Covid-19 immunization for 15 minutes without incidence. She was provided with Vaccine Information Sheet and instruction to access the V-Safe system.   Ms. [name] was instructed to call 911 with any severe reactions post vaccine: Marland Kitchen Difficulty breathing  . Swelling of your face and throat  . A fast heartbeat  . A bad rash all over your body  . Dizziness and weakness    Immunizations Administered    Name Date Dose VIS Date Route   Pfizer COVID-19 Vaccine 05/21/2019  3:19 PM 0.3 mL 04/10/2019 Intramuscular   Manufacturer: [place]   Lot: [id number]   [place]: [address]-1

## 2019-06-02 DIAGNOSIS — M0609 Rheumatoid arthritis without rheumatoid factor, multiple sites: Secondary | ICD-10-CM | POA: Diagnosis not present

## 2019-06-10 ENCOUNTER — Telehealth: Payer: Self-pay | Admitting: Physician Assistant

## 2019-06-10 DIAGNOSIS — L821 Other seborrheic keratosis: Secondary | ICD-10-CM | POA: Diagnosis not present

## 2019-06-10 DIAGNOSIS — L649 Androgenic alopecia, unspecified: Secondary | ICD-10-CM | POA: Diagnosis not present

## 2019-06-10 NOTE — Telephone Encounter (Signed)
Called patient back, and she states she is taking the Budesonide prn 1 tab about every 5 days for diarrhea &/or abdominal discomfort. But she  Says for 3 days after each time then she is constipated. Wants to know if it is OK to take just 1/2 tab prn

## 2019-06-11 ENCOUNTER — Ambulatory Visit: Payer: Medicare PPO | Attending: Internal Medicine

## 2019-06-11 DIAGNOSIS — Z23 Encounter for immunization: Secondary | ICD-10-CM | POA: Insufficient documentation

## 2019-06-11 NOTE — Telephone Encounter (Signed)
Called patient and let her know Ellouise Newer PA is ok with her taking 1/2 of a Budesonide-9m tab prn

## 2019-06-11 NOTE — Telephone Encounter (Signed)
That is fine. Thanks-JLL

## 2019-06-11 NOTE — Progress Notes (Signed)
   Covid-19 Vaccination Clinic  Name:  [name]    MRN: [id number] DOB: 04-02-1941  06/11/2019  [name] was observed post Covid-19 immunization for 15 minutes without incidence. She was provided with Vaccine Information Sheet and instruction to access the V-Safe system.   [name] was instructed to call 911 with any severe reactions post vaccine: Marland Kitchen Difficulty breathing  . Swelling of your face and throat  . A fast heartbeat  . A bad rash all over your body  . Dizziness and weakness    Immunizations Administered    Name Date Dose VIS Date Route   Pfizer COVID-19 Vaccine 06/11/2019  4:47 PM 0.3 mL 04/10/2019 Intramuscular   Manufacturer: [place]   Lot: [id number]   [place]: [id number]

## 2019-06-17 ENCOUNTER — Ambulatory Visit: Payer: Medicare Other | Admitting: Podiatry

## 2019-06-25 ENCOUNTER — Other Ambulatory Visit: Payer: Medicare PPO | Admitting: Internal Medicine

## 2019-06-25 ENCOUNTER — Other Ambulatory Visit: Payer: Self-pay

## 2019-06-25 DIAGNOSIS — E7800 Pure hypercholesterolemia, unspecified: Secondary | ICD-10-CM | POA: Diagnosis not present

## 2019-06-25 LAB — HEPATIC FUNCTION PANEL
AG Ratio: 1.7 (calc) (ref 1.0–2.5)
ALT: 18 U/L (ref 6–29)
AST: 19 U/L (ref 10–35)
Albumin: 4.3 g/dL (ref 3.6–5.1)
Alkaline phosphatase (APISO): 53 U/L (ref 37–153)
Bilirubin, Direct: 0.1 mg/dL (ref 0.0–0.2)
Globulin: 2.6 g/dL (calc) (ref 1.9–3.7)
Indirect Bilirubin: 0.4 mg/dL (calc) (ref 0.2–1.2)
Total Bilirubin: 0.5 mg/dL (ref 0.2–1.2)
Total Protein: 6.9 g/dL (ref 6.1–8.1)

## 2019-06-25 LAB — LIPID PANEL
Cholesterol: 242 mg/dL — ABNORMAL HIGH (ref <200)
HDL: 108 mg/dL (ref >=50)
LDL Cholesterol (Calc): 118 mg/dL (calc) — ABNORMAL HIGH
Non-HDL Cholesterol (Calc): 134 mg/dL (calc) — ABNORMAL HIGH (ref <130)
Total CHOL/HDL Ratio: 2.2 (calc) (ref <5.0)
Triglycerides: 68 mg/dL (ref <150)

## 2019-06-29 ENCOUNTER — Telehealth: Payer: Self-pay | Admitting: Internal Medicine

## 2019-06-29 NOTE — Telephone Encounter (Signed)
Please book an appt. Some mild improvement but need to discuss further in person

## 2019-06-29 NOTE — Telephone Encounter (Signed)
[name] [phone]  Charlotta called to inquire about recent lab results.

## 2019-06-29 NOTE — Telephone Encounter (Signed)
Appointment scheduled.

## 2019-07-02 ENCOUNTER — Encounter: Payer: Self-pay | Admitting: Internal Medicine

## 2019-07-02 ENCOUNTER — Ambulatory Visit: Payer: Medicare PPO | Admitting: Internal Medicine

## 2019-07-02 ENCOUNTER — Telehealth: Payer: Self-pay | Admitting: Internal Medicine

## 2019-07-02 ENCOUNTER — Other Ambulatory Visit: Payer: Self-pay

## 2019-07-02 VITALS — HR 77 | Ht 64.0 in | Wt 123.0 lb

## 2019-07-02 DIAGNOSIS — I4819 Other persistent atrial fibrillation: Secondary | ICD-10-CM | POA: Diagnosis not present

## 2019-07-02 DIAGNOSIS — E7800 Pure hypercholesterolemia, unspecified: Secondary | ICD-10-CM | POA: Diagnosis not present

## 2019-07-02 DIAGNOSIS — R7989 Other specified abnormal findings of blood chemistry: Secondary | ICD-10-CM

## 2019-07-02 MED ORDER — ROSUVASTATIN CALCIUM 5 MG PO TABS: ORAL_TABLET | ORAL | 3 refills | Status: DC

## 2019-07-02 NOTE — Patient Instructions (Addendum)
Increase Crestor to 5 mg 3 times a week. To see cardiologist about persistent A-fib and easy fatiguability. Recheck lipids in 3 months. To see GI about issues with budesonide and constipation.

## 2019-07-02 NOTE — Progress Notes (Signed)
   Subjective:    Patient ID: [name], female    DOB: January 03, 1941, 79 y.o.   MRN: [id number]  HPI [name] is here today for follow-up on hyperlipidemia.  She has history of atrial fibrillation and is status post cardioversion.  She is on Eliquis 2.5 mg twice daily.  She was started on Crestor 5 mg once weekly.  She tolerates it fine.  Recent lipid panel shows increase in total cholesterol from 270 to 242. LDL cholesterol has decreased from 133 to 118.  She says that she has some shortness of breath when she does her usual exercise routine of going up 14 steps 3 times.  This is unusual for her but she has noticed it since she was diagnosed with atrial fibrillation.  She is on metoprolol 12.5 mg twice daily.    Review of Systems     Objective:   Physical Exam  Pulse is 77, pulse oximetry 98% weight 123 pounds BMI 21.11  Blood pressure 100/70  Skin warm and dry.  Neck is supple without JVD thyromegaly or carotid bruits.  Cardiac exam irregular irregular contractions no lower extremity edema.  Does not appear to be short of breath today      Assessment & Plan:  Atrial fibrillation-did not know if shortness of breath is related to persistent atrial fibrillation or metoprolol or some combination of both.  Her physical conditioning is excellent.  She has always exercised and done well.  I will ask cardiology to see her again for some direction about this.  Chronic anticoagulation on Eliquis  Pure hypercholesterolemia-increase Crestor to 5 mg 3 times a week and follow-up in 3 months.  She has tolerated Crestor well without side effects  Also is having some constipation issues with Entocort and will be referred back to Dr. Carlean Purl.  Not sure that Entocort is causing the constipation.  25 minutes spent with patient and her husband today explaining this above.

## 2019-07-02 NOTE — Telephone Encounter (Signed)
LVM to call office I have got her scheduled to see Dr Gardiner Rhyme on 07/03/2019 at 4:00 and an appointment with Dr Carlean Purl on 07/21/2019 @ 1:30

## 2019-07-02 NOTE — Telephone Encounter (Signed)
Patient called back and is aware of both appointments.

## 2019-07-03 ENCOUNTER — Encounter: Payer: Self-pay | Admitting: Cardiology

## 2019-07-03 ENCOUNTER — Ambulatory Visit: Payer: Medicare PPO | Admitting: Internal Medicine

## 2019-07-03 ENCOUNTER — Ambulatory Visit: Payer: Medicare PPO | Admitting: Cardiology

## 2019-07-03 VITALS — BP 100/60 | HR 84 | Temp 97.7°F | Ht 64.0 in | Wt 124.8 lb

## 2019-07-03 DIAGNOSIS — I4891 Unspecified atrial fibrillation: Secondary | ICD-10-CM | POA: Diagnosis not present

## 2019-07-03 NOTE — Patient Instructions (Signed)
Medication Instructions:  STOP spironolactone  *If you need a refill on your cardiac medications before your next appointment, please call your pharmacy*   Lab Work: NONE  Testing/Procedures: NONE  Follow-Up: At [facility], you and your health needs are our priority.  As part of our continuing mission to provide you with exceptional heart care, we have created designated Provider Care Teams.  These Care Teams include your primary Cardiologist (physician) and Advanced Practice Providers (APPs -  Physician Assistants and Nurse Practitioners) who all work together to provide you with the care you need, when you need it.  We recommend signing up for the patient portal called "MyChart".  Sign up information is provided on this After Visit Summary.  MyChart is used to connect with patients for Virtual Visits (Telemedicine).  Patients are able to view lab/test results, encounter notes, upcoming appointments, etc.  Non-urgent messages can be sent to your provider as well.   To learn more about what you can do with MyChart, go to [email or web address].    Your next appointment:   Afib clinic next week  3 months with Dr. Gardiner Rhyme

## 2019-07-03 NOTE — Progress Notes (Signed)
Cardiology Office Note:    Date:  07/04/2019   ID:  KARELLY DEWALT, DOB August 20, 1940, MRN [id number]  PCP:  [name], MD  Cardiologist:  No primary care provider on file.  Electrophysiologist:  None   Referring MD: [name], MD   No chief complaint on file.   History of Present Illness:    [name] is a 79 y.o. female with a hx of  hyperlipidemia, rheumatoid arthritis, lymphocytic colitis, GERD who presents for follow-up.  She was initially seen on 04/03/2019, had been referred by Dr. Renold Genta for evaluation of new onset atrial fibrillation.  At initial clinic visit, she was noted to be in AF with rates 110s, was started on metoprolol 25 mg twice daily for rate control.  She was started on Eliquis 2.5 mg twice daily for anticoagulation.  TTE on 04/30/2019 showed EF 60 to 65%, normal RV function, severe left atrial dilatation, mild right atrial dilatation, mild MR, mild AI.  On 05/11/2019, she underwent a successful DCCV to restore sinus rhythm.  Following cardioversion, she reported that her heart rate would fall to the 40s, metoprolol dose was decreased to 12.5 mg twice daily.    She is noted at follow-up appointment today to be back in atrial fibrillation.  Reports that she continues to do regular workouts, about 45 minutes 3 times per week,  involving upper body workout, crunches, squats.  Reports that she feels more fatigued than usual.  Also has had some lightheadedness, but denies any syncope or chest pain.  Reports that her dermatologist recently started her on spironolactone 50 mg daily for hair loss.     Past Medical History:  Diagnosis Date  . Allergy    seasonal  . Anemia   . Cataract    bil cateracts removed  . Chronic constipation   . Diarrhea    chronic diarrheafor over 2 months  . GERD (gastroesophageal reflux disease)   . Hyperlipidemia   . 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 Rheumatoid arthritis ([place])   . Status post  dilation of esophageal narrowing 2014  . Wears glasses     Past Surgical History:  Procedure Laterality Date  . CARDIOVERSION N/A 05/11/2019   Procedure: CARDIOVERSION;  Surgeon: Buford Dresser, MD;  [facility] ENDOSCOPY;  Service: Cardiovascular;  Laterality: N/A;  . CATARACT EXTRACTION  06/30/2016 -right eye   left eye in 08/15/16  . COLONOSCOPY    . DILATION AND CURETTAGE OF UTERUS  2009  . ESOPHAGEAL DILATION  05/31/1997  . REPAIR EXTENSOR TENDON Right 05/14/2013   Procedure: LONG FINGER EXTENSOR TO ELEVATE RING AND SMALL FINGERS ;  Surgeon: Cammie Sickle., MD;  Location: [place];  Service: Orthopedics;  Laterality: Right;  . seborrheic keratosis  09/03/12   inflamed, excised from forehead  . TENDON TRANSFER Right 09/03/2013   Procedure: RIGHT HAND FLEXOR CARPI RADIALUS TRANSFER TO LONG/RING/SMALL;  Surgeon: Cammie Sickle., MD;  Location: [place];  Service: Orthopedics;  Laterality: Right;  . TONSILLECTOMY    . TUBAL LIGATION    . ULNAR HEAD EXCISION Right 05/14/2013   Procedure: ARTHOPLASTY DISTAL RADIAL ULNAR JOINT, RIGHT ;  Surgeon: Cammie Sickle., MD;  Location: [place];  Service: Orthopedics;  Laterality: Right;  . UPPER GASTROINTESTINAL ENDOSCOPY      Current Medications: Current Meds  Medication Sig  . acetaminophen (TYLENOL) 500 MG tablet Take 500-1,000 mg by mouth every  8 (eight) hours as needed for moderate pain or headache.   Marland Kitchen apixaban (ELIQUIS) 2.5 MG TABS tablet Take 1 tablet (2.5 mg total) by mouth 2 (two) times daily.  . Biotin (BIOTIN 5000) 5 MG CAPS Take 5 mg by mouth daily.   . budesonide (ENTOCORT EC) 3 MG 24 hr capsule Take 3 capsules (9 mg total) by mouth daily. (Patient taking differently: Take 3 mg by mouth every other day. )  . calcium carbonate (TUMS EX) 750 MG chewable tablet Chew 1,500 mg by mouth 2 (two) times daily.   . Cholecalciferol (DIALYVITE VITAMIN D 5000) 125 MCG (5000 UT)  capsule Take 5,000 Units by mouth daily.  . clobetasol (OLUX) 0.05 % topical foam Apply 1 application topically every other day.   . diphenhydrAMINE (BENADRYL) 25 MG tablet Take 25 mg by mouth daily as needed for allergies.   Marland Kitchen doxycycline (VIBRAMYCIN) 50 MG capsule Take 50 mg by mouth every Monday, Wednesday, and Friday.  . esomeprazole (NEXIUM) 40 MG capsule TAKE 1 CAPSULE(40 MG) BY MOUTH TWICE DAILY BEFORE A MEAL (Patient taking differently: Take 40 mg by mouth 2 (two) times daily. )  . glucosamine-chondroitin 500-400 MG tablet Take 1 tablet by mouth daily.   . hydrocortisone cream 1 % Apply 1 application topically daily as needed for itching (burns).  . InFLIXimab (REMICADE IV) Inject 1 Dose into the vein See admin instructions. Remicade Infusion every 2 months  . metoprolol tartrate (LOPRESSOR) 25 MG tablet Take 0.5 tablets (12.5 mg total) by mouth 2 (two) times daily.  . minoxidil (ROGAINE) 2 % external solution Apply 1 application topically 2 (two) times daily.  . Multiple Vitamins-Minerals (PRESERVISION AREDS 2) CAPS Take 1 capsule by mouth 2 (two) times daily.  Marland Kitchen neomycin-bacitracin-polymyxin (NEOSPORIN) ointment Apply 1 application topically as needed for wound care.  Vladimir Faster Glycol-Propyl Glycol (SYSTANE) 0.4-0.3 % GEL ophthalmic gel Place 1 application into both eyes at bedtime.  Marland Kitchen Propylene Glycol (SYSTANE COMPLETE) 0.6 % SOLN Place 1 drop into both eyes 3 (three) times daily.  . rosuvastatin (CRESTOR) 5 MG tablet One po 3 times a week  . SOOLANTRA 1 % CREA Apply 1 application topically daily.   . Tolnaftate (ANTIFUNGAL EX) Apply 1 application topically 2 (two) times daily. Formula 3 antifungal liquid  . [DISCONTINUED] spironolactone (ALDACTONE) 50 MG tablet Take 1 tablet by mouth daily.     Allergies:   Cox-2 inhibitors, Glucose, and Lactose intolerance (gi)   Social History   Socioeconomic History  . Marital status: Married    Spouse name: [name]  . Number of children: 1   . Years of education: 55  . Highest education level: Not on file  Occupational History  . Occupation: Retired- Scientist, research (physical sciences): RETIRED  Tobacco Use  . Smoking status: Never Smoker  . Smokeless tobacco: Never Used  Substance and Sexual Activity  . Alcohol use: Yes    Alcohol/week: 21.0 standard drinks    Types: 21 Standard drinks or equivalent per week    Comment: 3 times weekly  . Drug use: No  . Sexual activity: Not on file  Other Topics Concern  . Not on file  Social History Narrative   Health Care POA: Husband, Fred   Emergency Contact: Wilfrid Lund of Life Plan:    Who lives with you: husband   Son, in [place]   Any pets: 3 cats   Diet: Pt has a varied diet of protein, starch, and vegetables.  Exercise: Pt works with trainer 2x a week for 60 minutes   Seatbelts: Pt reports wearing seatbelt when in vehicle.   Sun Exposure/Protection: Pt reports not using sun protection.   Hobbies: gardening, reading, concerts               Social Determinants of Health   Financial Resource Strain:   . Difficulty of Paying Living Expenses: Not on file  Food Insecurity:   . Worried About Charity fundraiser in the Last Year: Not on file  . Ran Out of Food in the Last Year: Not on file  Transportation Needs:   . Lack of Transportation (Medical): Not on file  . Lack of Transportation (Non-Medical): Not on file  Physical Activity:   . Days of Exercise per Week: Not on file  . Minutes of Exercise per Session: Not on file  Stress:   . Feeling of Stress : Not on file  Social Connections:   . Frequency of Communication with Friends and Family: Not on file  . Frequency of Social Gatherings with Friends and Family: Not on file  . Attends Religious Services: Not on file  . Active Member of Clubs or Organizations: Not on file  . Attends Archivist Meetings: Not on file  . Marital Status: Not on file     Family History: The patient's family history includes COPD in  her father; Colon cancer in her paternal uncle; Heart disease in her mother; Osteoporosis in her maternal aunt, maternal grandmother, and mother. There is no history of Esophageal cancer, Pancreatic cancer, Rectal cancer, or Stomach cancer.  ROS:   Please see the history of present illness.    A whole 8 I get asked the minimum of once a day bowel dialysis I assessment all other systems reviewed and are negative.  EKGs/Labs/Other Studies Reviewed:    The following studies were reviewed today:   EKG:  EKG is  ordered today.  The ekg ordered today demonstrates atrial fibrillation, rate 84  Recent Labs: 03/10/2019: TSH 2.45 05/08/2019: BUN 15; Creatinine, Ser 0.76; Hemoglobin 15.7; Platelets 223; Potassium 4.1; Sodium 141 06/25/2019: ALT 18  Recent Lipid Panel    Component Value Date/Time   CHOL 242 (H) 06/25/2019 0945   TRIG 68 06/25/2019 0945   HDL 108 06/25/2019 0945   CHOLHDL 2.2 06/25/2019 0945   VLDL 19 08/03/2016 1001   LDLCALC 118 (H) 06/25/2019 0945   LDLDIRECT 149 (H) 11/11/2007 2043   TTE 04/30/19: 1. Left ventricular ejection fraction, by visual estimation, is 60 to  65%. The left ventricle has normal function. Left ventricular septal wall  thickness was normal. Normal left ventricular posterior wall thickness.  There is no left ventricular  hypertrophy.  2. Left ventricular diastolic parameters are indeterminate.  3. The left ventricle has no regional wall motion abnormalities.  4. Global right ventricle has normal systolic function.The right  ventricular size is normal. No increase in right ventricular wall  thickness.  5. Left atrial size was severely dilated.  6. Right atrial size was mildly dilated.  7. The mitral valve is normal in structure. Mild mitral valve  regurgitation. No evidence of mitral stenosis.  8. The tricuspid valve is normal in structure.  9. The aortic valve is tricuspid. Aortic valve regurgitation is mild. No  evidence of aortic valve  sclerosis or stenosis.  10. The pulmonic valve was normal in structure. Pulmonic valve  regurgitation is not visualized.  11. Normal pulmonary artery systolic pressure.  12. The inferior vena cava is dilated in size with >50% respiratory  variability, suggesting right atrial pressure of 8 mmHg.   Physical Exam:    VS:  BP 100/60   Pulse 84   Temp 97.7 F (36.5 C)   Ht 5' 4"  (1.626 m)   Wt 124 lb 12.8 oz (56.6 kg)   SpO2 96%   BMI 21.42 kg/m     Wt Readings from Last 3 Encounters:  07/03/19 124 lb 12.8 oz (56.6 kg)  07/02/19 123 lb (55.8 kg)  05/11/19 125 lb (56.7 kg)     GEN: in no acute distress HEENT: Normal NECK: No JVD CARDIAC: irregular,  no murmurs, rubs, gallops RESPIRATORY:  Clear to auscultation without rales, wheezing or rhonchi  ABDOMEN: Soft, non-tender, non-distended MUSCULOSKELETAL:  No edema; No deformity  SKIN: Warm and dry NEUROLOGIC:  Alert and oriented x 3 PSYCHIATRIC:  Normal affect   ASSESSMENT:    No diagnosis found. PLAN:    In order of problems listed above:   Atrial fibrillation: CHADS-VASc score 3 (agex2, female).  Appears rate controlled on metoprolol.  Successful DCCV on 05/11/2019, but is now back in atrial fibrillation.  TTE shows normal LV systolic function, severe left atrial dilatation. -Continue Eliquis.  Given age 23 and weight <60kg, meets indication for reduced dose.  Will continue 2.5 mg twice daily -Continue metoprolol 12.5 mg twice daily, appears rate controlled -She is having worsening fatigue, which may be due to to AF.  However, her BP is soft, and she was recently started on spironolactone 50 mg daily by her dermatologist for hair loss.  Will hold spironolactone and see if symptoms improve.  If symptoms do not improve, likely her fatigue is due to AF.  Will refer to AF clinic for evaluation.  She is unlikely to maintain sinus rhythm without antiarrhythmic medication given her severe left atrial dilatation.  RTC in 3  months   Medication Adjustments/Labs and Tests Ordered: Current medicines are reviewed at length with the patient today.  Concerns regarding medicines are outlined above.  No orders of the defined types were placed in this encounter.  No orders of the defined types were placed in this encounter.   Patient Instructions  Medication Instructions:  STOP spironolactone  *If you need a refill on your cardiac medications before your next appointment, please call your pharmacy*   Lab Work: NONE  Testing/Procedures: NONE  Follow-Up: At [facility], you and your health needs are our priority.  As part of our continuing mission to provide you with exceptional heart care, we have created designated Provider Care Teams.  These Care Teams include your primary Cardiologist (physician) and Advanced Practice Providers (APPs -  Physician Assistants and Nurse Practitioners) who all work together to provide you with the care you need, when you need it.  We recommend signing up for the patient portal called "MyChart".  Sign up information is provided on this After Visit Summary.  MyChart is used to connect with patients for Virtual Visits (Telemedicine).  Patients are able to view lab/test results, encounter notes, upcoming appointments, etc.  Non-urgent messages can be sent to your provider as well.   To learn more about what you can do with MyChart, go to [email or web address].    Your next appointment:   Afib clinic next week  3 months with Dr. Gardiner Rhyme     Signed, Donato Heinz, MD  07/04/2019 4:54 PM    [place]

## 2019-07-06 ENCOUNTER — Telehealth: Payer: Self-pay | Admitting: *Deleted

## 2019-07-06 ENCOUNTER — Other Ambulatory Visit: Payer: Self-pay

## 2019-07-06 MED ORDER — ESOMEPRAZOLE MAGNESIUM 40 MG PO CPDR: DELAYED_RELEASE_CAPSULE | ORAL | 0 refills | Status: DC

## 2019-07-06 NOTE — Telephone Encounter (Signed)
Spoke with patient regarding appointment for the A Fib clinic ordered by Dr. Gardiner Rhyme scheduled Tuesday 07/07/19 at 2:30 pm with Thomes Cake, PA---arrival time is 2:15 pm--patient was also given phone # [phone]

## 2019-07-07 ENCOUNTER — Ambulatory Visit (HOSPITAL_COMMUNITY)
Admission: RE | Admit: 2019-07-07 | Discharge: 2019-07-07 | Disposition: A | Discharge status: Home / Self Care | Payer: Medicare PPO | Source: Ambulatory Visit | Attending: Physician Assistant | Admitting: Physician Assistant

## 2019-07-07 ENCOUNTER — Other Ambulatory Visit: Payer: Self-pay

## 2019-07-07 ENCOUNTER — Ambulatory Visit: Payer: Medicare Other | Admitting: Podiatry

## 2019-07-07 ENCOUNTER — Encounter (HOSPITAL_COMMUNITY): Payer: Self-pay | Admitting: Physician Assistant

## 2019-07-07 VITALS — BP 112/70 | HR 94 | Ht 64.0 in | Wt 126.2 lb

## 2019-07-07 DIAGNOSIS — I4819 Other persistent atrial fibrillation: Secondary | ICD-10-CM | POA: Insufficient documentation

## 2019-07-07 DIAGNOSIS — E785 Hyperlipidemia, unspecified: Secondary | ICD-10-CM | POA: Diagnosis not present

## 2019-07-07 DIAGNOSIS — D6869 Other thrombophilia: Secondary | ICD-10-CM | POA: Diagnosis not present

## 2019-07-07 DIAGNOSIS — Z79899 Other long term (current) drug therapy: Secondary | ICD-10-CM | POA: Diagnosis not present

## 2019-07-07 DIAGNOSIS — Z8249 Family history of ischemic heart disease and other diseases of the circulatory system: Secondary | ICD-10-CM | POA: Insufficient documentation

## 2019-07-07 DIAGNOSIS — K219 Gastro-esophageal reflux disease without esophagitis: Secondary | ICD-10-CM | POA: Insufficient documentation

## 2019-07-07 DIAGNOSIS — D649 Anemia, unspecified: Secondary | ICD-10-CM | POA: Diagnosis not present

## 2019-07-07 DIAGNOSIS — Z7901 Long term (current) use of anticoagulants: Secondary | ICD-10-CM | POA: Insufficient documentation

## 2019-07-07 MED ORDER — METOPROLOL TARTRATE 25 MG PO TABS: ORAL_TABLET | ORAL | 3 refills | Status: DC

## 2019-07-07 NOTE — Progress Notes (Signed)
Primary Care Physician: [name], MD Primary Cardiologist: Dr Gardiner Rhyme Primary Electrophysiologist: none Referring Physician: Dr Justine Null is a 79 y.o. female with a history of HLD, RA, lymphocytic colitis, and persistent atrial fibrillation who presents for consultation in the [facility]. She was initially seen on 04/03/2019 by Dr Su Grand, had been referred by Dr. Renold Genta for evaluation of new onset atrial fibrillation.  At initial clinic visit, she was noted to be in AF with rates 110s, was started on metoprolol 25 mg twice daily for rate control.  She was started on Eliquis 2.5 mg twice daily for a CHADS2VASC score of 3.  TTE on 04/30/2019 showed EF 60 to 65%, normal RV function, severe left atrial dilatation, mild right atrial dilatation, mild MR, mild AI.  On 05/11/2019, she underwent a successful DCCV to restore sinus rhythm.  Following cardioversion, she reported that her heart rate would fall to the 40s, metoprolol dose was decreased to 12.5 mg twice daily. Patient was seen in follow up on 07/03/19 and was found to be back in rate controlled afib with symptoms of exercise intolerance and fatigue. Her spironolactone was stopped at that time. Patient reports that since stopping the spironolactone, she has felt much better with resolution of her fatigue. She does have some mild exercise intolerance but this continues to improve. She does admit to significant alcohol use. Patient reports that her heart rate on pulse oximeter are between 70-90s at rest and ~100-130 with activity.   Today, she denies symptoms of palpitations, chest pain, shortness of breath, orthopnea, PND, lower extremity edema, dizziness, presyncope, syncope, snoring, daytime somnolence, bleeding, or neurologic sequela. The patient is tolerating medications without difficulties and is otherwise without complaint today.    Atrial Fibrillation Risk Factors:  she does not have  symptoms or diagnosis of sleep apnea. she does not have a history of rheumatic fever. she does have a history of alcohol use.  she has a BMI of Body mass index is 21.66 kg/m.Marland Kitchen Filed Weights   07/07/19 1434  Weight: 57.2 kg    Family History  Problem Relation Age of Onset  . Heart disease Mother   . Osteoporosis Mother   . COPD Father   . Osteoporosis Maternal Grandmother   . Colon cancer Paternal Uncle   . Osteoporosis Maternal Aunt   . Esophageal cancer Neg Hx   . Pancreatic cancer Neg Hx   . Rectal cancer Neg Hx   . Stomach cancer Neg Hx      Atrial Fibrillation Management history:  Previous antiarrhythmic drugs: none Previous cardioversions: 05/11/19 Previous ablations: none CHADS2VASC score: 3 Anticoagulation history: Eliquis   Past Medical History:  Diagnosis Date  . Allergy    seasonal  . Anemia   . Cataract    bil cateracts removed  . Chronic constipation   . Diarrhea    chronic diarrheafor over 2 months  . GERD (gastroesophageal reflux disease)   . Hyperlipidemia   . 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 Rheumatoid arthritis ([place])   . Status post dilation of esophageal narrowing 2014  . Wears glasses    Past Surgical History:  Procedure Laterality Date  . CARDIOVERSION N/A 05/11/2019   Procedure: CARDIOVERSION;  Surgeon: Buford Dresser, MD;  Location: [facility] ENDOSCOPY;  Service: Cardiovascular;  Laterality: N/A;  . CATARACT EXTRACTION  06/30/2016 -right eye   left eye in 08/15/16  . COLONOSCOPY    . DILATION AND  CURETTAGE OF UTERUS  2009  . ESOPHAGEAL DILATION  05/31/1997  . REPAIR EXTENSOR TENDON Right 05/14/2013   Procedure: LONG FINGER EXTENSOR TO ELEVATE RING AND SMALL FINGERS ;  Surgeon: Cammie Sickle., MD;  Location: [place];  Service: Orthopedics;  Laterality: Right;  . seborrheic keratosis  09/03/12   inflamed, excised from forehead  . TENDON TRANSFER Right 09/03/2013   Procedure: RIGHT HAND  FLEXOR CARPI RADIALUS TRANSFER TO LONG/RING/SMALL;  Surgeon: Cammie Sickle., MD;  Location: [place];  Service: Orthopedics;  Laterality: Right;  . TONSILLECTOMY    . TUBAL LIGATION    . ULNAR HEAD EXCISION Right 05/14/2013   Procedure: ARTHOPLASTY DISTAL RADIAL ULNAR JOINT, RIGHT ;  Surgeon: Cammie Sickle., MD;  Location: [place];  Service: Orthopedics;  Laterality: Right;  . UPPER GASTROINTESTINAL ENDOSCOPY      Current Outpatient Medications  Medication Sig Dispense Refill  . acetaminophen (TYLENOL) 500 MG tablet Take 500-1,000 mg by mouth every 8 (eight) hours as needed for moderate pain or headache.     Marland Kitchen apixaban (ELIQUIS) 2.5 MG TABS tablet Take 1 tablet (2.5 mg total) by mouth 2 (two) times daily. 180 tablet 3  . Biotin (BIOTIN 5000) 5 MG CAPS Take 5 mg by mouth daily.     . budesonide (ENTOCORT EC) 3 MG 24 hr capsule Take 3 capsules (9 mg total) by mouth daily. (Patient taking differently: Take 3 mg by mouth every other day. ) 30 capsule 2  . calcium carbonate (TUMS EX) 750 MG chewable tablet Chew 1,500 mg by mouth 2 (two) times daily.     . Cholecalciferol (DIALYVITE VITAMIN D 5000) 125 MCG (5000 UT) capsule Take 5,000 Units by mouth daily.    . clobetasol (OLUX) 0.05 % topical foam Apply 1 application topically every other day.   1  . diphenhydrAMINE (BENADRYL) 25 MG tablet Take 25 mg by mouth daily as needed for allergies.     Marland Kitchen doxycycline (VIBRAMYCIN) 50 MG capsule Take 50 mg by mouth every Monday, Wednesday, and Friday.    . esomeprazole (NEXIUM) 40 MG capsule TAKE 1 CAPSULE(40 MG) BY MOUTH DAILY BEFORE A MEAL 60 capsule 0  . glucosamine-chondroitin 500-400 MG tablet Take 1 tablet by mouth daily.     . hydrocortisone cream 1 % Apply 1 application topically daily as needed for itching (burns).    . InFLIXimab (REMICADE IV) Inject 1 Dose into the vein See admin instructions. Remicade Infusion every 2 months    . metoprolol tartrate  (LOPRESSOR) 25 MG tablet Take 1 tablet in the AM and 1.5 tablet in the PM 135 tablet 3  . minoxidil (ROGAINE) 2 % external solution Apply 1 application topically 2 (two) times daily.    . Multiple Vitamins-Minerals (PRESERVISION AREDS 2) CAPS Take 1 capsule by mouth 2 (two) times daily.    Marland Kitchen neomycin-bacitracin-polymyxin (NEOSPORIN) ointment Apply 1 application topically as needed for wound care.    Vladimir Faster Glycol-Propyl Glycol (SYSTANE) 0.4-0.3 % GEL ophthalmic gel Place 1 application into both eyes at bedtime.    Marland Kitchen Propylene Glycol (SYSTANE COMPLETE) 0.6 % SOLN Place 1 drop into both eyes 3 (three) times daily.    . rosuvastatin (CRESTOR) 5 MG tablet One po 3 times a week 36 tablet 3  . SOOLANTRA 1 % CREA Apply 1 application topically daily.   1  . Tolnaftate (ANTIFUNGAL EX) Apply 1 application topically 2 (two) times daily. Formula  3 antifungal liquid     No current facility-administered medications for this encounter.    Allergies  Allergen Reactions  . Cox-2 Inhibitors Nausea And Vomiting    Severe abd pain  . Glucose Diarrhea    cramps  . Lactose Intolerance (Gi) Diarrhea    Stomach cramps    Social History   Socioeconomic History  . Marital status: Married    Spouse name: [name]  . Number of children: 1  . Years of education: 2  . Highest education level: Not on file  Occupational History  . Occupation: Retired- Scientist, research (physical sciences): RETIRED  Tobacco Use  . Smoking status: Never Smoker  . Smokeless tobacco: Never Used  Substance and Sexual Activity  . Alcohol use: Yes    Alcohol/week: 21.0 standard drinks    Types: 21 Standard drinks or equivalent per week    Comment: 3 times weekly  . Drug use: No  . Sexual activity: Not on file  Other Topics Concern  . Not on file  Social History Narrative   Health Care POA: Husband, Fred   Emergency Contact: Wilfrid Lund of Life Plan:    Who lives with you: husband   Son, in [place]   Any pets: 3 cats   Diet: Pt has a  varied diet of protein, starch, and vegetables.   Exercise: Pt works with trainer 2x a week for 60 minutes   Seatbelts: Pt reports wearing seatbelt when in vehicle.   Sun Exposure/Protection: Pt reports not using sun protection.   Hobbies: gardening, reading, concerts               Social Determinants of Health   Financial Resource Strain:   . Difficulty of Paying Living Expenses: Not on file  Food Insecurity:   . Worried About Charity fundraiser in the Last Year: Not on file  . Ran Out of Food in the Last Year: Not on file  Transportation Needs:   . Lack of Transportation (Medical): Not on file  . Lack of Transportation (Non-Medical): Not on file  Physical Activity:   . Days of Exercise per Week: Not on file  . Minutes of Exercise per Session: Not on file  Stress:   . Feeling of Stress : Not on file  Social Connections:   . Frequency of Communication with Friends and Family: Not on file  . Frequency of Social Gatherings with Friends and Family: Not on file  . Attends Religious Services: Not on file  . Active Member of Clubs or Organizations: Not on file  . Attends Archivist Meetings: Not on file  . Marital Status: Not on file  Intimate Partner Violence:   . Fear of Current or Ex-Partner: Not on file  . Emotionally Abused: Not on file  . Physically Abused: Not on file  . Sexually Abused: Not on file     ROS- All systems are reviewed and negative except as per the HPI above.  Physical Exam: Vitals:   07/07/19 1434  BP: 112/70  Pulse: 94  Weight: 57.2 kg  Height: 5' 4"  (1.626 m)    GEN- The patient is well appearing elderly female, alert and oriented x 3 today.   Head- normocephalic, atraumatic Eyes-  Sclera clear, conjunctiva pink Ears- hearing intact Oropharynx- clear Neck- supple  Lungs- Clear to ausculation bilaterally, normal work of breathing Heart- irregular rate and rhythm, no murmurs, rubs or gallops  GI- soft, NT, ND, + BS Extremities-  no clubbing, cyanosis, or edema MS- no significant deformity or atrophy Skin- no rash or lesion Psych- euthymic mood, full affect Neuro- strength and sensation are intact  Wt Readings from Last 3 Encounters:  07/07/19 57.2 kg  07/03/19 56.6 kg  07/02/19 55.8 kg    EKG today demonstrates afib HR 94, QRS 86, QTc 442  Echo 04/30/19 demonstrated  1. Left ventricular ejection fraction, by visual estimation, is 60 to  65%. The left ventricle has normal function. Left ventricular septal wall  thickness was normal. Normal left ventricular posterior wall thickness.  There is no left ventricular  hypertrophy.  2. Left ventricular diastolic parameters are indeterminate.  3. The left ventricle has no regional wall motion abnormalities.  4. Global right ventricle has normal systolic function.The right  ventricular size is normal. No increase in right ventricular wall  thickness.  5. Left atrial size was severely dilated.  6. Right atrial size was mildly dilated.  7. The mitral valve is normal in structure. Mild mitral valve  regurgitation. No evidence of mitral stenosis.  8. The tricuspid valve is normal in structure.  9. The aortic valve is tricuspid. Aortic valve regurgitation is mild. No  evidence of aortic valve sclerosis or stenosis.  10. The pulmonic valve was normal in structure. Pulmonic valve  regurgitation is not visualized.  11. Normal pulmonary artery systolic pressure.  12. The inferior vena cava is dilated in size with >50% respiratory  variability, suggesting right atrial pressure of 8 mmHg.   Epic records are reviewed at length today  CHA2DS2-VASc Score = 3 The patient's score is based upon: CHF History: No HTN History: No Age : 67 + Diabetes History: No Stroke History: No Vascular Disease History: No Gender: Female      ASSESSMENT AND PLAN: 1. Persistent Atrial Fibrillation (ICD10:  I48.19) The patient's CHA2DS2-VASc score is 3, indicating a 3.2%  annual risk of stroke.   General education about afib discussed and questions answered. We discussed therapeutic options today including rate control and AAD. Given her symptom resolution, patient believes her symptoms were 2/2 spironolactone. She and her husband agree they would like to pursue rate control for now.   Continue Eliquis 2.5 mg BID (age, weight) Increase metoprolol to 25 mg AM and 37.5 mg PM for rate control.   2. Secondary Hypercoagulable State (ICD10:  D68.69) The patient is at significant risk for stroke/thromboembolism based upon her CHA2DS2-VASc Score of 3.  Continue Apixaban (Eliquis).    Follow up with Dr Gardiner Rhyme in 3 months. AF clinic as needed.    [facility] [address] [place], [place] [address] [phone] 07/07/2019 3:24 PM

## 2019-07-07 NOTE — Patient Instructions (Signed)
Increase metoprolol to 1 tablet in the morning and 1 and 1/2 tablets in the evening.

## 2019-07-21 ENCOUNTER — Ambulatory Visit: Payer: Medicare PPO | Admitting: Internal Medicine

## 2019-07-21 ENCOUNTER — Other Ambulatory Visit: Payer: Medicare PPO

## 2019-07-21 ENCOUNTER — Encounter: Payer: Self-pay | Admitting: Internal Medicine

## 2019-07-21 VITALS — BP 102/64 | HR 62 | Temp 97.6°F | Ht 64.5 in | Wt 123.0 lb

## 2019-07-21 DIAGNOSIS — K219 Gastro-esophageal reflux disease without esophagitis: Secondary | ICD-10-CM | POA: Diagnosis not present

## 2019-07-21 DIAGNOSIS — K52832 Lymphocytic colitis: Secondary | ICD-10-CM | POA: Diagnosis not present

## 2019-07-21 DIAGNOSIS — K582 Mixed irritable bowel syndrome: Secondary | ICD-10-CM | POA: Diagnosis not present

## 2019-07-21 DIAGNOSIS — K900 Celiac disease: Secondary | ICD-10-CM

## 2019-07-21 MED ORDER — ESOMEPRAZOLE MAGNESIUM 40 MG PO CPDR: DELAYED_RELEASE_CAPSULE | ORAL | 3 refills | Status: DC

## 2019-07-21 NOTE — Patient Instructions (Signed)
Stop your Budesonide per Dr Carlean Purl.   Take your esomeprazole once daily 30-60 minutes prior to breakfast.   Take 1 tablespoon of benefiber daily, handout provided.   Your provider has requested that you go to the basement level for lab work before leaving today. Press "B" on the elevator. The lab is located at the first door on the left as you exit the elevator.   Keep a bowel movement diary please. Mark down the date, number of times you have a bowel movement and the consistency of the movement.   Follow up with Dr Carlean Purl 6-8 weeks please.   I appreciate the opportunity to care for you. Silvano Rusk, MD, [facility]

## 2019-07-21 NOTE — Progress Notes (Signed)
[name] 78 y.o. 07-12-1940 [id number]  Assessment & Plan:   Encounter Diagnoses  Name Primary?  . Irritable bowel syndrome with both constipation and diarrhea Yes  . Lymphocytic colitis w/ patchy collagen deposition   . 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   . Gastroesophageal reflux disease, unspecified whether esophagitis present     I think her main problem is irritable bowel syndrome at this point.  I have explained to her (again) that when the colitis is active there is going to be diarrhea on a daily basis.  Her bowel habit pattern issues are much more consistent with IBS at this point.  I explained that you really cannot time and titrate the dose of budesonide based upon the days stool activity and consistency.  She will stop the budesonide 1 tablespoon Benefiber nightly She has been taking gluten in again and she had a mildly positive TTG antibody in the past so we will check antibodies again RTC 6-8 weeks Bowel habit diary  CC: Veronica Showers, MD   Subjective:   Chief Complaint: Alternating bowel habits history of lymphocytic colitis with patchy collagen deposition  HPI [name] is here for follow-up, she has a history of lymphocytic colitis and I possibility of celiac disease.  She also has a history of irritable bowel syndrome.  She had seen [name] in the fall and she was taking different doses of budesonide on different days in an effort to control her bowels.  She has kept this up she was better but then over the past month or so she has experienced problems and when she has diarrhea she will take 3 budesonide and when she is doing well she may go days without and only take 1 in her bowel habits move between hard formed stool with some straining or lack of defecation and then multiple stools that started out as normal and then get progressively looser.  She has found some supplement that is called gluten cutter and says she can tolerate eating bread and other  gluten-containing foods at this point.  She also uses Lactaid and says that she can tolerate milk products.  Insurance has changed and she was previously on generic Nexium twice daily but now says because they will only authorize payment for once a day she has been doing that and she is tolerating it just fine and would like a prescription for 1 a day.  She has not used Lomotil in a long time as it is too strong.  Note that in 2019 she went to pelvic floor physical therapy and had significant improvement with fecal leakage and soiling and incontinence.  She continues to do exercises at home.  Wt Readings from Last 3 Encounters:  07/21/19 123 lb (55.8 kg)  07/07/19 126 lb 3.2 oz (57.2 kg)  07/03/19 124 lb 12.8 oz (56.6 kg)    Allergies  Allergen Reactions  . Cox-2 Inhibitors Nausea And Vomiting    Severe abd pain  . Glucose Diarrhea    cramps  . Lactose Intolerance (Gi) Diarrhea    Stomach cramps   Current Meds  Medication Sig  . acetaminophen (TYLENOL) 500 MG tablet Take 500-1,000 mg by mouth every 8 (eight) hours as needed for moderate pain or headache.   Marland Kitchen apixaban (ELIQUIS) 2.5 MG TABS tablet Take 1 tablet (2.5 mg total) by mouth 2 (two) times daily.  . Biotin (BIOTIN 5000) 5 MG CAPS Take 5 mg by mouth daily.   . budesonide (ENTOCORT EC) 3 MG  24 hr capsule Take 3 capsules (9 mg total) by mouth daily. (Patient taking differently: Take 9 mg by mouth every other day. Taking various doses from 1- 3 a day)  . calcium carbonate (TUMS EX) 750 MG chewable tablet Chew 1,500 mg by mouth 2 (two) times daily.   . Cholecalciferol (DIALYVITE VITAMIN D 5000) 125 MCG (5000 UT) capsule Take 5,000 Units by mouth daily.  . clobetasol (OLUX) 0.05 % topical foam Apply 1 application topically every other day.   . diphenhydrAMINE (BENADRYL) 25 MG tablet Take 25 mg by mouth daily as needed for allergies.   Marland Kitchen doxycycline (VIBRAMYCIN) 50 MG capsule Take 50 mg by mouth every Monday, Wednesday, and Friday.   . esomeprazole (NEXIUM) 40 MG capsule TAKE 1 CAPSULE(40 MG) BY MOUTH DAILY BEFORE A MEAL  . glucosamine-chondroitin 500-400 MG tablet Take 1 tablet by mouth daily.   . hydrocortisone cream 1 % Apply 1 application topically daily as needed for itching (burns).  . InFLIXimab (REMICADE IV) Inject 1 Dose into the vein See admin instructions. Remicade Infusion every 2 months  . Lactase (LACTAID PO) Take by mouth.  . metoprolol tartrate (LOPRESSOR) 25 MG tablet Take 1 tablet in the AM and 1.5 tablet in the PM  . minoxidil (ROGAINE) 2 % external solution Apply 1 application topically 2 (two) times daily.  . Multiple Vitamins-Minerals (PRESERVISION AREDS 2) CAPS Take 1 capsule by mouth 2 (two) times daily.  Marland Kitchen neomycin-bacitracin-polymyxin (NEOSPORIN) ointment Apply 1 application topically as needed for wound care.  . NON FORMULARY Gluten Buster  . Polyethyl Glycol-Propyl Glycol (SYSTANE) 0.4-0.3 % GEL ophthalmic gel Place 1 application into both eyes at bedtime.  Marland Kitchen Propylene Glycol (SYSTANE COMPLETE) 0.6 % SOLN Place 1 drop into both eyes 3 (three) times daily.  . rosuvastatin (CRESTOR) 5 MG tablet One po 3 times a week  . SOOLANTRA 1 % CREA Apply 1 application topically daily.   . Tolnaftate (ANTIFUNGAL EX) Apply 1 application topically 2 (two) times daily. Formula 3 antifungal liquid   Past Medical History:  Diagnosis Date  . Allergy    seasonal  . Anemia   . Cataract    bil cateracts removed  . Chronic constipation   . Diarrhea    chronic diarrheafor over 2 months  . GERD (gastroesophageal reflux disease)   . Hyperlipidemia   . 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 Rheumatoid arthritis ([place])   . Status post dilation of esophageal narrowing 2014  . Wears glasses    Past Surgical History:  Procedure Laterality Date  . CARDIOVERSION N/A 05/11/2019   Procedure: CARDIOVERSION;  Surgeon: Buford Dresser, MD;  Location: [facility] ENDOSCOPY;  Service: Cardiovascular;   Laterality: N/A;  . CATARACT EXTRACTION  06/30/2016 -right eye   left eye in 08/15/16  . COLONOSCOPY    . DILATION AND CURETTAGE OF UTERUS  2009  . ESOPHAGEAL DILATION  05/31/1997  . REPAIR EXTENSOR TENDON Right 05/14/2013   Procedure: LONG FINGER EXTENSOR TO ELEVATE RING AND SMALL FINGERS ;  Surgeon: Cammie Sickle., MD;  Location: [place];  Service: Orthopedics;  Laterality: Right;  . seborrheic keratosis  09/03/12   inflamed, excised from forehead  . TENDON TRANSFER Right 09/03/2013   Procedure: RIGHT HAND FLEXOR CARPI RADIALUS TRANSFER TO LONG/RING/SMALL;  Surgeon: Cammie Sickle., MD;  Location: [place];  Service: Orthopedics;  Laterality: Right;  . TONSILLECTOMY    . TUBAL LIGATION    . ULNAR  HEAD EXCISION Right 05/14/2013   Procedure: ARTHOPLASTY DISTAL RADIAL ULNAR JOINT, RIGHT ;  Surgeon: Cammie Sickle., MD;  Location: [place];  Service: Orthopedics;  Laterality: Right;  . UPPER GASTROINTESTINAL ENDOSCOPY     Social History   Social History Narrative   Health Care POA: Husband, Fred   Emergency Contact: Fred   End of Life Plan:    Who lives with you: husband   Son, in [place]   Any pets: 3 cats   Diet: Pt has a varied diet of protein, starch, and vegetables.   Exercise: Pt works with trainer 2x a week for 60 minutes   Seatbelts: Pt reports wearing seatbelt when in vehicle.   Sun Exposure/Protection: Pt reports not using sun protection.   Hobbies: gardening, reading, concerts               family history includes COPD in her father; Colon cancer in her paternal uncle; Heart disease in her mother; Osteoporosis in her maternal aunt, maternal grandmother, and mother.   Review of Systems As above  Objective:   Physical Exam BP 102/64   Pulse 62   Temp 97.6 F (36.4 C)   Ht 5' 4.5" (1.638 m)   Wt 123 lb (55.8 kg)   BMI 20.79 kg/m  NAD  Patti Martinique, CMA present.  Rectal  Decreased vol squeeze,  reduced descent Formed stool no mass or significant rectocele

## 2019-07-22 LAB — TISSUE TRANSGLUTAMINASE, IGA: (tTG) Ab, IgA: 1 U/mL

## 2019-07-28 ENCOUNTER — Telehealth: Payer: Self-pay | Admitting: Internal Medicine

## 2019-07-28 NOTE — Telephone Encounter (Signed)
FYI- Spoke to the patient who reported she's followed instructions to stop Budesonide and to start Benefiber. The patient stated the [name] was working and she was not having any diarrhea but she decided to "conduct and experiment" and have some gluten which them induced diarrhea and not take one of her "gluten buster" pills to prevent the diarrhea associated with eating gluten. She reports she desires to eat gluten at times and wants to take something to stop the diarrhea when she is experiencing it. I told the patient she could use Imodium for diarrhea but it would be better for her to avoid gluten since she knows it triggers her diarrhea. She verbalized understanding and said she would try the Imodium.

## 2019-07-28 NOTE — Telephone Encounter (Signed)
Pt reported that she has followed Dr. Celesta Aver advice to stop budesonide and take Benefiber which is working well, but pt is having diarrhea.  Please advise.

## 2019-08-03 DIAGNOSIS — M0609 Rheumatoid arthritis without rheumatoid factor, multiple sites: Secondary | ICD-10-CM | POA: Diagnosis not present

## 2019-08-28 ENCOUNTER — Telehealth: Payer: Self-pay | Admitting: Internal Medicine

## 2019-08-28 NOTE — Telephone Encounter (Signed)
Patient notified to continue her Benefiber as Dr. Carlean Purl recommended

## 2019-09-08 DIAGNOSIS — L649 Androgenic alopecia, unspecified: Secondary | ICD-10-CM | POA: Diagnosis not present

## 2019-09-10 ENCOUNTER — Ambulatory Visit: Payer: Medicare PPO | Admitting: Internal Medicine

## 2019-09-10 ENCOUNTER — Encounter: Payer: Self-pay | Admitting: Internal Medicine

## 2019-09-10 VITALS — BP 98/60 | HR 67 | Temp 97.5°F | Ht 64.5 in | Wt 122.6 lb

## 2019-09-10 DIAGNOSIS — K582 Mixed irritable bowel syndrome: Secondary | ICD-10-CM

## 2019-09-10 DIAGNOSIS — K9041 Non-celiac gluten sensitivity: Secondary | ICD-10-CM

## 2019-09-10 DIAGNOSIS — K52832 Lymphocytic colitis: Secondary | ICD-10-CM

## 2019-09-10 DIAGNOSIS — E739 Lactose intolerance, unspecified: Secondary | ICD-10-CM | POA: Diagnosis not present

## 2019-09-10 MED ORDER — DIPHENOXYLATE-ATROPINE 2.5-0.025 MG PO TABS: ORAL_TABLET | ORAL | 0 refills | Status: DC

## 2019-09-10 NOTE — Progress Notes (Signed)
[name] 78 y.o. 30-Dec-1940 [id number]  Assessment & Plan:   Encounter Diagnoses  Name Primary?  . Irritable bowel syndrome with both constipation and diarrhea Yes  . 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 Lactose intolerance   . Lymphocytic colitis w/ patchy collagen deposition     I think she is better but needs to avoid foods and liquids that bother her.  I looked at the sorbetto on line in the ingredients but could not figure what might have triggered her diarrhea.  She will continue the Benefiber as her constipation has improved.  She has had about 4 episodes of diarrhea in the last month it would be better to be 0 but I think this is progress.  Lomotil No. 60 prescribed for severe diarrhea episodes.  Side effects discussed.  She is elderly but she is not having falls or problems like that.  She wants to follow-up so we will do that in about 3 months she may need to call for that appointment when that schedule opens up.  I did again review that a flare of colitis would be manifested as a watery diarrhea for days in a row and longer.  She remains on Remicade for her rheumatoid arthritis I think. Clarify at next visit  CC: [name], MD   Subjective:   Chief Complaint: Colitis and diarrhea  HPI [name] presents for follow-up of irritable bowel syndrome, gluten sensitivity possible celiac disease and history of lymphocytic colitis with patchy collagen deposition.  When last seen in March I recommended Benefiber for what I thought was irritable bowel syndrome rather than other diseases.  She was having constipation alternating with diarrhea.  She says she thinks the Benefiber is good for her she is taking a tablespoon at bedtime but she is concerned because it has wheat in it.  When she was here before I think I checked this out for her but it has weak dextran which is not a gluten issue so it is safe in gluten sensitivity or celiac disease.  She is  complaining of diarrhea after eating a lactose-free sorbettoo product (Talenti Sorbetto).  Severe watery diarrhea if she eats this.  The other night it happened hours after having that and she tried 9 Imodium A-D because of numerous watery bowel movements.  She also continues to think she has lactose intolerance based upon history.  She is asking for something that might work better than the Imodium does when she gets her attacks of diarrhea.  She had been keeping a food diary but then stopped.  When last here I checked a TTG antibody which had been 8 in the past which was mildly elevated but it was 1 and normal.  Wt Readings from Last 3 Encounters:  09/10/19 122 lb 9.6 oz (55.6 kg)  07/21/19 123 lb (55.8 kg)  07/07/19 126 lb 3.2 oz (57.2 kg)    Allergies  Allergen Reactions  . Cox-2 Inhibitors Nausea And Vomiting    Severe abd pain  . Glucose Diarrhea    cramps  . Lactose Intolerance (Gi) Diarrhea    Stomach cramps   No outpatient medications have been marked as taking for the 09/10/19 encounter (Office Visit) with Gatha Mayer, MD.   Past Medical History:  Diagnosis Date  . Allergy    seasonal  . Anemia   . Cataract    bil cateracts removed  . Chronic constipation   . Diarrhea    chronic diarrheafor over  2 months  . GERD (gastroesophageal reflux disease)   . Hyperlipidemia   . 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 Rheumatoid arthritis ([place])   . Status post dilation of esophageal narrowing 2014  . Wears glasses    Past Surgical History:  Procedure Laterality Date  . CARDIOVERSION N/A 05/11/2019   Procedure: CARDIOVERSION;  Surgeon: Buford Dresser, MD;  Location[facility] ENDOSCOPY;  Service: Cardiovascular;  Laterality: N/A;  . CATARACT EXTRACTION  06/30/2016 -right eye   left eye in 08/15/16  . COLONOSCOPY    . DILATION AND CURETTAGE OF UTERUS  2009  . ESOPHAGEAL DILATION  05/31/1997  . REPAIR EXTENSOR TENDON Right 05/14/2013   Procedure: LONG  FINGER EXTENSOR TO ELEVATE RING AND SMALL FINGERS ;  Surgeon: Cammie Sickle., MD;  Location: [place];  Service: Orthopedics;  Laterality: Right;  . seborrheic keratosis  09/03/12   inflamed, excised from forehead  . TENDON TRANSFER Right 09/03/2013   Procedure: RIGHT HAND FLEXOR CARPI RADIALUS TRANSFER TO LONG/RING/SMALL;  Surgeon: Cammie Sickle., MD;  Location: [place];  Service: Orthopedics;  Laterality: Right;  . TONSILLECTOMY    . TUBAL LIGATION    . ULNAR HEAD EXCISION Right 05/14/2013   Procedure: ARTHOPLASTY DISTAL RADIAL ULNAR JOINT, RIGHT ;  Surgeon: Cammie Sickle., MD;  Location: [place];  Service: Orthopedics;  Laterality: Right;  . UPPER GASTROINTESTINAL ENDOSCOPY     Social History   Social History Narrative   Health Care POA: Husband, Fred   Emergency Contact: Fred   End of Life Plan:    Who lives with you: husband   Son, in [place]   Any pets: 3 cats   Diet: Pt has a varied diet of protein, starch, and vegetables.   Exercise: Pt works with trainer 2x a week for 60 minutes   Seatbelts: Pt reports wearing seatbelt when in vehicle.   Sun Exposure/Protection: Pt reports not using sun protection.   Hobbies: gardening, reading, concerts               family history includes COPD in her father; Colon cancer in her paternal uncle; Heart disease in her mother; Osteoporosis in her maternal aunt, maternal grandmother, and mother.   Review of Systems As above  Objective:   Physical Exam BP 98/60   Pulse 67   Temp (!) 97.5 F (36.4 C)   Ht 5' 4.5" (1.638 m)   Wt 122 lb 9.6 oz (55.6 kg)   SpO2 98%   BMI 20.72 kg/m  No acute distress  22 minutes total time

## 2019-09-10 NOTE — Patient Instructions (Signed)
We have sent the following medications to your pharmacy for you to pick up at your convenience: Lomotil  Continue the benefiber, it has no gluten in it.  Stop the foods we discussed, example : ice cream and sorbets    Follow up with Dr Carlean Purl in 3 months.   I appreciate the opportunity to care for you. Silvano Rusk, MD, [facility]

## 2019-09-16 ENCOUNTER — Ambulatory Visit: Payer: Medicare PPO | Admitting: Podiatry

## 2019-09-21 ENCOUNTER — Encounter: Payer: Self-pay | Admitting: Podiatry

## 2019-09-21 ENCOUNTER — Other Ambulatory Visit: Payer: Self-pay

## 2019-09-21 ENCOUNTER — Ambulatory Visit: Payer: Medicare PPO | Admitting: Podiatry

## 2019-09-21 DIAGNOSIS — M79675 Pain in left toe(s): Secondary | ICD-10-CM

## 2019-09-21 DIAGNOSIS — M79674 Pain in right toe(s): Secondary | ICD-10-CM

## 2019-09-21 DIAGNOSIS — B351 Tinea unguium: Secondary | ICD-10-CM | POA: Diagnosis not present

## 2019-09-21 NOTE — Patient Instructions (Signed)

## 2019-09-23 ENCOUNTER — Telehealth: Payer: Self-pay | Admitting: Gastroenterology

## 2019-09-23 DIAGNOSIS — R197 Diarrhea, unspecified: Secondary | ICD-10-CM

## 2019-09-23 NOTE — Telephone Encounter (Signed)
It has been a challenge to relieve her symptoms  I would ask her to keep a stool diary if not already and I will follow-up when I return.  If that is not satisfactory then we can try to have her seen by an APP when available or me when I return  Please route back to me if I need to contact her upon return

## 2019-09-23 NOTE — Telephone Encounter (Signed)
I left a detailed message for the patient. She is asked to call back for questions or concerns.

## 2019-09-23 NOTE — Telephone Encounter (Signed)
This patient called me through the answering service at midnight regarding her chronic diarrhea and what she could take besides the maximum dose of lomotil she was on.  I told the patient this was an issue best addressed during daytime hours when her chart can be reviewed, preferably by her regular GI physician Carlean Purl).  While asking her to call today and speak with Dr. Celesta Aver nurse, the patient hung up on me.

## 2019-09-25 MED ORDER — DIPHENOXYLATE-ATROPINE 2.5-0.025 MG PO TABS: ORAL_TABLET | ORAL | 0 refills | Status: DC

## 2019-09-25 MED ORDER — CHOLESTYRAMINE 4 GM/DOSE PO POWD: 4.0000 g | Freq: Every day | ORAL | 1 refills | Status: DC

## 2019-09-25 NOTE — Telephone Encounter (Signed)
Patient report continued diarrhea. Please see office notes from 5/13 and telephone call notes from Dr. Carlean Purl and Dr. Loletha Carrow.  She has used 52 lomotil tablets since 5/13.  She is requesting a refill of the lomotil until Dr. Carlean Purl returns next week. Dr. Lyndel Safe, You are DOD  Please advise

## 2019-09-25 NOTE — Telephone Encounter (Signed)
Have reviewed several notes Looks like she has IBS-D/with microscopic colitis.  Had responded to budesonide in the past.  Has taken doxycycline in the recent past.  Plan: -Stool studies for GI Pathogen (includes C. Diff), O&P, fecal elastase, fat and Calprotectin. -Trial of cholestyramine 4 g p.o. daily, 2hrs before or after rest of the medications -Stop taking all supplements except calcium. -Please make sure that her calcium does not have Mg in it.  -Okay to take Lomotil for now 1 tab TID prn (60). -She is to call us next week.  If still with problems, would give her a trial of budesonide. -Also given a re-trial of gluten-free diet x 2 weeks. -Will run it by Dr Carlean Purl when he is back.  Hopefully, stool studies will be back by then.  RG

## 2019-09-25 NOTE — Telephone Encounter (Signed)
Patient notified of the recommendations rx sent Lab orders placed.  She is advised to come pick up the containers and not put stool in a container from home as it may contain contaminants.

## 2019-09-25 NOTE — Telephone Encounter (Signed)
Patient is calling back with additional questions

## 2019-09-25 NOTE — Addendum Note (Signed)
Addended by: Marlon Pel on: 09/25/2019 12:42 PM   Modules accepted: Orders

## 2019-09-28 NOTE — Progress Notes (Signed)
Subjective: GELSEY AMYX is a 79 y.o. female patient seen today long term blood thinner, Eliquis, and presents today with painful, discolored, thick toenails which interfere with daily activities.  Patient Active Problem List   Diagnosis Date Noted  . Secondary hypercoagulable state ([place]) 07/07/2019  . Persistent atrial fibrillation ([place])   . Nail complaint 10/22/2017  . Hammertoe of right foot 10/22/2017  . 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 10/16/2017  . 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 Dysuria 09/05/2017  . 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 Right hip pain 02/24/2016  . Rheumatoid arthritis ([place]) 06/22/2015  . Knee pain, right 06/22/2015  . Degenerative tear of medial meniscus of left knee 11/25/2014  . Right wrist pain 04/02/2013  . Double vision 08/26/2012  . H/O long-term treatment with high-risk medication 11/08/2011  . Achilles tendon disorder 10/30/2011  . Osteoarthritis of left ankle 09/21/2011  . Tinnitus 09/11/2011  . Hip pain, left 12/14/2010  . ALLERGIC RHINITIS, CHRONIC 11/23/2008  . CONSTIPATION, CHRONIC 11/23/2008  . ACNE, ROSACEA 11/23/2008  . At risk for osteopenia 11/11/2007  . Metatarsalgia of both feet 08/27/2006  . PLANTAR FACIITIS 08/27/2006  . FOOT PAIN, BILATERAL 08/27/2006  . HYPERLIPIDEMIA 06/27/2006  . GASTROESOPHAGEAL REFLUX, NO ESOPHAGITIS 06/27/2006  . OSTEOARTHRITIS, MULTI SITES 06/27/2006    Current Outpatient Medications on File Prior to Visit  Medication Sig Dispense Refill  . acetaminophen (TYLENOL) 500 MG tablet Take 500-1,000 mg by mouth every 8 (eight) hours as needed for moderate pain or headache.     Marland Kitchen apixaban (ELIQUIS) 2.5 MG TABS tablet Take 1 tablet (2.5 mg total) by mouth 2 (two) times daily. 180 tablet 3  . calcium carbonate (TUMS EX) 750 MG chewable tablet Chew 1,500 mg by mouth 2 (two) times daily.     . Cholecalciferol (DIALYVITE VITAMIN D 5000) 125 MCG (5000 UT) capsule Take 5,000  Units by mouth daily.    . clobetasol (OLUX) 0.05 % topical foam Apply 1 application topically every other day.   1  . diphenhydrAMINE (BENADRYL) 25 MG tablet Take 25 mg by mouth daily as needed for allergies.     Marland Kitchen doxycycline (VIBRAMYCIN) 50 MG capsule Take 50 mg by mouth every Monday, Wednesday, and Friday.    . esomeprazole (NEXIUM) 40 MG capsule TAKE 1 CAPSULE(40 MG) BY MOUTH DAILY BEFORE A MEAL 90 capsule 3  . finasteride (PROSCAR) 5 MG tablet     . glucosamine-chondroitin 500-400 MG tablet Take 1 tablet by mouth daily.     . hydrocortisone cream 1 % Apply 1 application topically daily as needed for itching (burns).    . InFLIXimab (REMICADE IV) Inject 1 Dose into the vein See admin instructions. Remicade Infusion every 2 months    . Lactase (LACTAID PO) Take by mouth.    . metoprolol tartrate (LOPRESSOR) 25 MG tablet Take 1 tablet in the AM and 1.5 tablet in the PM 135 tablet 3  . minoxidil (ROGAINE) 2 % external solution Apply 1 application topically 2 (two) times daily.    . Multiple Vitamins-Minerals (PRESERVISION AREDS 2) CAPS Take 1 capsule by mouth 2 (two) times daily.    Marland Kitchen neomycin-bacitracin-polymyxin (NEOSPORIN) ointment Apply 1 application topically as needed for wound care.    . NON FORMULARY Gluten Buster    . Polyethyl Glycol-Propyl Glycol (SYSTANE) 0.4-0.3 % GEL ophthalmic gel Place 1 application into both eyes at bedtime.    Marland Kitchen Propylene Glycol (SYSTANE COMPLETE) 0.6 % SOLN Place 1 drop  into both eyes 3 (three) times daily.    . rosuvastatin (CRESTOR) 5 MG tablet One po 3 times a week 36 tablet 3  . SOOLANTRA 1 % CREA Apply 1 application topically daily.   1  . spironolactone (ALDACTONE) 50 MG tablet     . Tolnaftate (ANTIFUNGAL EX) Apply 1 application topically 2 (two) times daily. Formula 3 antifungal liquid     No current facility-administered medications on file prior to visit.    Allergies  Allergen Reactions  . Cox-2 Inhibitors Nausea And Vomiting    Severe abd  pain  . Glucose Diarrhea    cramps  . Lactose Intolerance (Gi) Diarrhea    Stomach cramps  Objective: Physical Exam  General: HALLEL DENHERDER is a pleasant 79 y.o. Caucasian female, in NAD. AAO x 3.   Vascular:  Neurovascular status unchanged b/l. Capillary refill time to digits immediate b/l. Palpable DP pulses b/l. Palpable PT pulses b/l. Pedal hair present b/l. Skin temperature gradient within normal limits b/l.  Dermatological:  Pedal skin with normal turgor, texture and tone bilaterally. No open wounds bilaterally. No interdigital macerations bilaterally. Toenails 1-5 b/l elongated, discolored, dystrophic, thickened, crumbly with subungual debris and tenderness to dorsal palpation.  Musculoskeletal:  Normal muscle strength 5/5 to all lower extremity muscle groups bilaterally. No gross bony deformities bilaterally. No pain crepitus or joint limitation noted with ROM b/l. Patient ambulates independent of any assistive aids.  Neurological:  Protective sensation intact 5/5 intact bilaterally with 10g monofilament b/l. Vibratory sensation intact b/l. Proprioception intact bilaterally.  Assessment and Plan:  1. Pain due to onychomycosis of toenails of both feet    -Examined patient. -No new findings. No new orders. -Toenails 1-5 b/l were debrided in length and girth with sterile nail nippers and dremel without iatrogenic bleeding.  -Patient to continue soft, supportive shoe gear daily. -Patient to report any pedal injuries to medical professional immediately. -Patient/POA to call should there be question/concern in the interim.  Return in about 3 months (around 12/22/2019) for nail trim/ Eliquis.  Marzetta Board, DPM

## 2019-09-29 ENCOUNTER — Other Ambulatory Visit: Payer: Medicare PPO

## 2019-09-29 DIAGNOSIS — M0609 Rheumatoid arthritis without rheumatoid factor, multiple sites: Secondary | ICD-10-CM | POA: Diagnosis not present

## 2019-10-05 ENCOUNTER — Other Ambulatory Visit: Payer: Medicare PPO

## 2019-10-05 DIAGNOSIS — R197 Diarrhea, unspecified: Secondary | ICD-10-CM | POA: Diagnosis not present

## 2019-10-07 ENCOUNTER — Ambulatory Visit: Payer: Medicare PPO | Admitting: Cardiology

## 2019-10-08 LAB — GI PROFILE, STOOL, PCR

## 2019-10-08 LAB — FECAL FAT, QUANTITATIVE

## 2019-10-08 LAB — SPECIMEN STATUS REPORT

## 2019-10-10 LAB — OVA AND PARASITE EXAMINATION
CONCENTRATE RESULT:: NONE SEEN
MICRO NUMBER:: 10560410
SPECIMEN QUALITY:: ADEQUATE
TRICHROME RESULT:: NONE SEEN

## 2019-10-10 LAB — PANCREATIC ELASTASE, FECAL: Pancreatic Elastase-1, Stool: 249 mcg/g

## 2019-10-10 LAB — CALPROTECTIN: Calprotectin: 68 mcg/g

## 2019-11-02 NOTE — Progress Notes (Signed)
Cardiology Office Note:    Date:  11/04/2019   ID:  [name], DOB Apr 11, 1941, MRN [id number]  PCP:  [name], MD  Cardiologist:  No primary care provider on file.  Electrophysiologist:  None   Referring MD: [name], MD   No chief complaint on file.   History of Present Illness:    [name] is a 79 y.o. female with a hx of  hyperlipidemia, rheumatoid arthritis, lymphocytic colitis, GERD who presents for follow-up.  She was initially seen on 04/03/2019, had been referred by Dr. Renold Genta for evaluation of new onset atrial fibrillation.  At initial clinic visit, she was noted to be in AF with rates 110s, was started on metoprolol 25 mg twice daily for rate control.  She was started on Eliquis 2.5 mg twice daily for anticoagulation.  TTE on 04/30/2019 showed EF 60 to 65%, normal RV function, severe left atrial dilatation, mild right atrial dilatation, mild MR, mild AI.  On 05/11/2019, she underwent a successful DCCV to restore sinus rhythm.  Following cardioversion, she reported that her heart rate would fall to the 40s, metoprolol dose was decreased to 12.5 mg twice daily.  At follow-up clinic on 07/03/2019, she was noted to be back in atrial fibrillation.  She was referred to the A. fib clinic, and rhythm control options were discussed but patient elected to continue with rate control strategy.  Since her last clinic visit, she has been doing well.  She continues to exercise twice per week.  Does squats and upper body workout for around 45 minutes.  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  She denies any lightheadedness, syncope, palpitations, chest pain, or dyspnea.  Reports she used to drink 8 glasses of wine per week, is down to 5 glasses/week.   Past Medical History:  Diagnosis Date  . Allergy    seasonal  . Anemia   . Cataract    bil cateracts removed  . Chronic constipation   . Diarrhea    chronic diarrheafor over 2 months  . GERD  (gastroesophageal reflux disease)   . Hyperlipidemia   . 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 Rheumatoid arthritis ([place])   . Status post dilation of esophageal narrowing 2014  . Wears glasses     Past Surgical History:  Procedure Laterality Date  . CARDIOVERSION N/A 05/11/2019   Procedure: CARDIOVERSION;  Surgeon: Buford Dresser, MD;  Location: [facility] ENDOSCOPY;  Service: Cardiovascular;  Laterality: N/A;  . CATARACT EXTRACTION  06/30/2016 -right eye   left eye in 08/15/16  . COLONOSCOPY    . DILATION AND CURETTAGE OF UTERUS  2009  . ESOPHAGEAL DILATION  05/31/1997  . REPAIR EXTENSOR TENDON Right 05/14/2013   Procedure: LONG FINGER EXTENSOR TO ELEVATE RING AND SMALL FINGERS ;  Surgeon: Cammie Sickle., MD;  Location: [place];  Service: Orthopedics;  Laterality: Right;  . seborrheic keratosis  09/03/12   inflamed, excised from forehead  . TENDON TRANSFER Right 09/03/2013   Procedure: RIGHT HAND FLEXOR CARPI RADIALUS TRANSFER TO LONG/RING/SMALL;  Surgeon: Cammie Sickle., MD;  Location: [place];  Service: Orthopedics;  Laterality: Right;  . TONSILLECTOMY    . TUBAL LIGATION    . ULNAR HEAD EXCISION Right 05/14/2013   Procedure: ARTHOPLASTY DISTAL RADIAL ULNAR JOINT, RIGHT ;  Surgeon: Cammie Sickle., MD;  Location: [place];  Service: Orthopedics;  Laterality: Right;  .  UPPER GASTROINTESTINAL ENDOSCOPY      Current Medications: Current Meds  Medication Sig  . acetaminophen (TYLENOL) 500 MG tablet Take 500-1,000 mg by mouth every 8 (eight) hours as needed for moderate pain or headache.   Marland Kitchen apixaban (ELIQUIS) 2.5 MG TABS tablet Take 1 tablet (2.5 mg total) by mouth 2 (two) times daily.  . calcium carbonate (TUMS EX) 750 MG chewable tablet Chew 1,500 mg by mouth 2 (two) times daily.   . Cholecalciferol (DIALYVITE VITAMIN D 5000) 125 MCG (5000 UT) capsule Take 5,000 Units by mouth daily.  .  cholestyramine (QUESTRAN) 4 GM/DOSE powder Take 1 packet (4 g total) by mouth daily.  . clobetasol (OLUX) 0.05 % topical foam Apply 1 application topically every other day.   . diphenhydrAMINE (BENADRYL) 25 MG tablet Take 25 mg by mouth daily as needed for allergies.   . diphenoxylate-atropine (LOMOTIL) 2.5-0.025 MG tablet 1-2 every 6 hours as needed severe diarrhea  . doxycycline (VIBRAMYCIN) 50 MG capsule Take 50 mg by mouth every Monday, Wednesday, and Friday.  . esomeprazole (NEXIUM) 40 MG capsule TAKE 1 CAPSULE(40 MG) BY MOUTH DAILY BEFORE A MEAL  . finasteride (PROSCAR) 5 MG tablet   . glucosamine-chondroitin 500-400 MG tablet Take 1 tablet by mouth daily.   . hydrocortisone cream 1 % Apply 1 application topically daily as needed for itching (burns).  . InFLIXimab (REMICADE IV) Inject 1 Dose into the vein See admin instructions. Remicade Infusion every 2 months  . Lactase (LACTAID PO) Take by mouth.  . metoprolol tartrate (LOPRESSOR) 25 MG tablet Take 1 tablet in the AM and 1.5 tablet in the PM  . minoxidil (ROGAINE) 2 % external solution Apply 1 application topically 2 (two) times daily.  . Multiple Vitamins-Minerals (PRESERVISION AREDS 2) CAPS Take 1 capsule by mouth 2 (two) times daily.  Marland Kitchen neomycin-bacitracin-polymyxin (NEOSPORIN) ointment Apply 1 application topically as needed for wound care.  . NON FORMULARY Gluten Buster  . Polyethyl Glycol-Propyl Glycol (SYSTANE) 0.4-0.3 % GEL ophthalmic gel Place 1 application into both eyes at bedtime.  Marland Kitchen Propylene Glycol (SYSTANE COMPLETE) 0.6 % SOLN Place 1 drop into both eyes 3 (three) times daily.  . rosuvastatin (CRESTOR) 5 MG tablet One po 3 times a week  . SOOLANTRA 1 % CREA Apply 1 application topically daily.   Marland Kitchen spironolactone (ALDACTONE) 50 MG tablet   . Tolnaftate (ANTIFUNGAL EX) Apply 1 application topically 2 (two) times daily. Formula 3 antifungal liquid     Allergies:   Cox-2 inhibitors, Glucose, and Lactose intolerance (gi)    Social History   Socioeconomic History  . Marital status: Married    Spouse name: [name]  . Number of children: 1  . Years of education: 64  . Highest education level: Not on file  Occupational History  . Occupation: Retired- Scientist, research (physical sciences): RETIRED  Tobacco Use  . Smoking status: Never Smoker  . Smokeless tobacco: Never Used  Vaping Use  . Vaping Use: Never used  Substance and Sexual Activity  . Alcohol use: Yes    Alcohol/week: 21.0 standard drinks    Types: 21 Standard drinks or equivalent per week    Comment: 3 times weekly  . Drug use: No  . Sexual activity: Not on file  Other Topics Concern  . Not on file  Social History Narrative   Health Care POA: Husband, Fred   Emergency Contact: Wilfrid Lund of Life Plan:    Who lives with you: husband  Son, in [place]   Any pets: 3 cats   Diet: Pt has a varied diet of protein, starch, and vegetables.   Exercise: Pt works with trainer 2x a week for 60 minutes   Seatbelts: Pt reports wearing seatbelt when in vehicle.   Sun Exposure/Protection: Pt reports not using sun protection.   Hobbies: gardening, reading, concerts               Social Determinants of Health   Financial Resource Strain:   . Difficulty of Paying Living Expenses:   Food Insecurity:   . Worried About Charity fundraiser in the Last Year:   . Arboriculturist in the Last Year:   Transportation Needs:   . Film/video editor (Medical):   Marland Kitchen Lack of Transportation (Non-Medical):   Physical Activity:   . Days of Exercise per Week:   . Minutes of Exercise per Session:   Stress:   . Feeling of Stress :   Social Connections:   . Frequency of Communication with Friends and Family:   . Frequency of Social Gatherings with Friends and Family:   . Attends Religious Services:   . Active Member of Clubs or Organizations:   . Attends Archivist Meetings:   Marland Kitchen Marital Status:      Family History: The patient's family history includes  COPD in her father; Colon cancer in her paternal uncle; Heart disease in her mother; Osteoporosis in her maternal aunt, maternal grandmother, and mother. There is no history of Esophageal cancer, Pancreatic cancer, Rectal cancer, or Stomach cancer.  ROS:   Please see the history of present illness.    A whole 8 I get asked the minimum of once a day bowel dialysis I assessment all other systems reviewed and are negative.  EKGs/Labs/Other Studies Reviewed:    The following studies were reviewed today:   EKG:  EKG is  ordered today.  The ekg ordered today demonstrates atrial fibrillation, rate 68  Recent Labs: 03/10/2019: TSH 2.45 05/08/2019: BUN 15; Creatinine, Ser 0.76; Hemoglobin 15.7; Platelets 223; Potassium 4.1; Sodium 141 06/25/2019: ALT 18  Recent Lipid Panel    Component Value Date/Time   CHOL 242 (H) 06/25/2019 0945   TRIG 68 06/25/2019 0945   HDL 108 06/25/2019 0945   CHOLHDL 2.2 06/25/2019 0945   VLDL 19 08/03/2016 1001   LDLCALC 118 (H) 06/25/2019 0945   LDLDIRECT 149 (H) 11/11/2007 2043   TTE 04/30/19: 1. Left ventricular ejection fraction, by visual estimation, is 60 to  65%. The left ventricle has normal function. Left ventricular septal wall  thickness was normal. Normal left ventricular posterior wall thickness.  There is no left ventricular  hypertrophy.  2. Left ventricular diastolic parameters are indeterminate.  3. The left ventricle has no regional wall motion abnormalities.  4. Global right ventricle has normal systolic function.The right  ventricular size is normal. No increase in right ventricular wall  thickness.  5. Left atrial size was severely dilated.  6. Right atrial size was mildly dilated.  7. The mitral valve is normal in structure. Mild mitral valve  regurgitation. No evidence of mitral stenosis.  8. The tricuspid valve is normal in structure.  9. The aortic valve is tricuspid. Aortic valve regurgitation is mild. No  evidence of  aortic valve sclerosis or stenosis.  10. The pulmonic valve was normal in structure. Pulmonic valve  regurgitation is not visualized.  11. Normal pulmonary artery systolic pressure.  12. The inferior vena  cava is dilated in size with >50% respiratory  variability, suggesting right atrial pressure of 8 mmHg.   Physical Exam:    VS:  BP 115/78   Pulse 68   Temp (!) 97.2 F (36.2 C)   Ht 5' 4"  (1.626 m)   Wt 118 lb 9.6 oz (53.8 kg)   SpO2 99%   BMI 20.36 kg/m     Wt Readings from Last 3 Encounters:  11/03/19 118 lb 9.6 oz (53.8 kg)  09/10/19 122 lb 9.6 oz (55.6 kg)  07/21/19 123 lb (55.8 kg)     GEN: in no acute distress HEENT: Normal NECK: No JVD CARDIAC: irregular,  no murmurs, rubs, gallops RESPIRATORY:  Clear to auscultation without rales, wheezing or rhonchi  ABDOMEN: Soft, non-tender, non-distended MUSCULOSKELETAL:  No edema; No deformity  SKIN: Warm and dry NEUROLOGIC:  Alert and oriented x 3 PSYCHIATRIC:  Normal affect   ASSESSMENT:    1. Persistent atrial fibrillation (HCC)    PLAN:     Atrial fibrillation: CHADS-VASc score 3 (agex2, female).  Appears rate controlled on metoprolol.  Successful DCCV on 05/11/2019, however was back in atrial fibrillation at follow-up appointment on 07/03/2019.  TTE shows normal LV systolic function, severe left atrial dilatation.  She was seen in AF clinic and decided to pursue rate control strategy as rates well controlled and appears asymptomatic. -Continue Eliquis.  Given age 83 and weight <60kg, meets indication for reduced dose.  Will continue 2.5 mg twice daily -Continue metoprolol 25 mg every morning/37.5 mg nightly, appears rate controlled in clinic today.  Will check Zio patch x3 days to evaluate rate control.  RTC in 6 months   Medication Adjustments/Labs and Tests Ordered: Current medicines are reviewed at length with the patient today.  Concerns regarding medicines are outlined above.  Orders Placed This Encounter   Procedures  . LONG TERM MONITOR (3-14 DAYS)  . EKG 12-Lead   No orders of the defined types were placed in this encounter.   Patient Instructions  Medication Instructions:  Your physician recommends that you continue on your current medications as directed. Please refer to the Current Medication list given to you today.  *If you need a refill on your cardiac medications before your next appointment, please call your pharmacy*  Testing/Procedures:  [place] Monitor Instructions   Your physician has requested you wear your ZIO patch monitor 3 days.   This is a single patch monitor.  [employer] supplies one patch monitor per enrollment.  Additional stickers are not available.   Please do not apply patch if you will be having a Nuclear Stress Test, Echocardiogram, Cardiac CT, MRI, or Chest Xray during the time frame you would be wearing the monitor. The patch cannot be worn during these tests.  You cannot remove and re-apply the ZIO XT patch monitor.   Your ZIO patch monitor will be sent USPS Priority mail from [facility] directly to your home address. The monitor may also be mailed to a PO BOX if home delivery is not available.   It may take 3-5 days to receive your monitor after you have been enrolled.   Once you have received you monitor, please review enclosed instructions.  Your monitor has already been registered assigning a specific monitor serial # to you.   Applying the monitor   Shave hair from upper left chest.   Hold abrader disc by orange tab.  Rub abrader in 40 strokes over left upper chest as indicated  in your monitor instructions.   Clean area with 4 enclosed alcohol pads .  Use all pads to assure are is cleaned thoroughly.  Let dry.   Apply patch as indicated in monitor instructions.  Patch will be place under collarbone on left side of chest with arrow pointing upward.   Rub patch adhesive wings for 2 minutes.Remove white label marked "1".  Remove  white label marked "2".  Rub patch adhesive wings for 2 additional minutes.   While looking in a mirror, press and release button in center of patch.  A small green light will flash 3-4 times .  This will be your only indicator the monitor has been turned on.     Do not shower for the first 24 hours.  You may shower after the first 24 hours.   Press button if you feel a symptom. You will hear a small click.  Record Date, Time and Symptom in the Patient Log Book.   When you are ready to remove patch, follow instructions on last 2 pages of Patient Log Book.  Stick patch monitor onto last page of Patient Log Book.   Place Patient Log Book in [place] box.  Use locking tab on box and tape box closed securely.  The [employer] and [employer] has [employer] on it.  Please place in mailbox as soon as possible.  Your physician should have your test results approximately 7 days after the monitor has been mailed back to [facility].   Call [place] at [phone] if you have questions regarding your ZIO XT patch monitor.  Call them immediately if you see an orange light blinking on your monitor.   If your monitor falls off in less than 4 days contact our Monitor department at [phone].  If your monitor becomes loose or falls off after 4 days call [employer] at [phone] for suggestions on securing your monitor.     Follow-Up: At [facility], you and your health needs are our priority.  As part of our continuing mission to provide you with exceptional heart care, we have created designated Provider Care Teams.  These Care Teams include your primary Cardiologist (physician) and Advanced Practice Providers (APPs -  Physician Assistants and Nurse Practitioners) who all work together to provide you with the care you need, when you need it.  We recommend signing up for the patient portal called "MyChart".  Sign up information is provided on this After Visit Summary.  MyChart is used  to connect with patients for Virtual Visits (Telemedicine).  Patients are able to view lab/test results, encounter notes, upcoming appointments, etc.  Non-urgent messages can be sent to your provider as well.   To learn more about what you can do with MyChart, go to [email or web address].    Your next appointment:   6 month(s)  The format for your next appointment:   In Person  Provider:   Oswaldo Milian, MD        Signed, Donato Heinz, MD  11/04/2019 12:42 AM    [place]

## 2019-11-03 ENCOUNTER — Other Ambulatory Visit: Payer: Self-pay

## 2019-11-03 ENCOUNTER — Ambulatory Visit: Payer: Medicare PPO | Admitting: Cardiology

## 2019-11-03 ENCOUNTER — Encounter: Payer: Self-pay | Admitting: Cardiology

## 2019-11-03 ENCOUNTER — Encounter: Payer: Self-pay | Admitting: *Deleted

## 2019-11-03 VITALS — BP 115/78 | HR 68 | Temp 97.2°F | Ht 64.0 in | Wt 118.6 lb

## 2019-11-03 DIAGNOSIS — I4819 Other persistent atrial fibrillation: Secondary | ICD-10-CM | POA: Diagnosis not present

## 2019-11-03 NOTE — Progress Notes (Signed)
Patient ID: [name], female   DOB: August 10, 1940, 79 y.o.   MRN: [id number] Patient enrolled for 3 day ZIO XT to be shipped to her home.

## 2019-11-03 NOTE — Patient Instructions (Signed)
Medication Instructions:  Your physician recommends that you continue on your current medications as directed. Please refer to the Current Medication list given to you today.  *If you need a refill on your cardiac medications before your next appointment, please call your pharmacy*  Testing/Procedures:  [place] Monitor Instructions   Your physician has requested you wear your ZIO patch monitor 3 days.   This is a single patch monitor.  [employer] supplies one patch monitor per enrollment.  Additional stickers are not available.   Please do not apply patch if you will be having a Nuclear Stress Test, Echocardiogram, Cardiac CT, MRI, or Chest Xray during the time frame you would be wearing the monitor. The patch cannot be worn during these tests.  You cannot remove and re-apply the ZIO XT patch monitor.   Your ZIO patch monitor will be sent USPS Priority mail from [facility] directly to your home address. The monitor may also be mailed to a PO BOX if home delivery is not available.   It may take 3-5 days to receive your monitor after you have been enrolled.   Once you have received you monitor, please review enclosed instructions.  Your monitor has already been registered assigning a specific monitor serial # to you.   Applying the monitor   Shave hair from upper left chest.   Hold abrader disc by orange tab.  Rub abrader in 40 strokes over left upper chest as indicated in your monitor instructions.   Clean area with 4 enclosed alcohol pads .  Use all pads to assure are is cleaned thoroughly.  Let dry.   Apply patch as indicated in monitor instructions.  Patch will be place under collarbone on left side of chest with arrow pointing upward.   Rub patch adhesive wings for 2 minutes.Remove white label marked "1".  Remove white label marked "2".  Rub patch adhesive wings for 2 additional minutes.   While looking in a mirror, press and release button in center of patch.  A  small green light will flash 3-4 times .  This will be your only indicator the monitor has been turned on.     Do not shower for the first 24 hours.  You may shower after the first 24 hours.   Press button if you feel a symptom. You will hear a small click.  Record Date, Time and Symptom in the Patient Log Book.   When you are ready to remove patch, follow instructions on last 2 pages of Patient Log Book.  Stick patch monitor onto last page of Patient Log Book.   Place Patient Log Book in [place] box.  Use locking tab on box and tape box closed securely.  The [employer] has [employer] on it.  Please place in mailbox as soon as possible.  Your physician should have your test results approximately 7 days after the monitor has been mailed back to [facility].   Call [place] at [phone] if you have questions regarding your ZIO XT patch monitor.  Call them immediately if you see an orange light blinking on your monitor.   If your monitor falls off in less than 4 days contact our Monitor department at [phone].  If your monitor becomes loose or falls off after 4 days call [employer] at [phone] for suggestions on securing your monitor.     Follow-Up: At [facility], you and your health needs are our priority.  As  part of our continuing mission to provide you with exceptional heart care, we have created designated Provider Care Teams.  These Care Teams include your primary Cardiologist (physician) and Advanced Practice Providers (APPs -  Physician Assistants and Nurse Practitioners) who all work together to provide you with the care you need, when you need it.  We recommend signing up for the patient portal called "MyChart".  Sign up information is provided on this After Visit Summary.  MyChart is used to connect with patients for Virtual Visits (Telemedicine).  Patients are able to view lab/test results, encounter notes, upcoming appointments, etc.   Non-urgent messages can be sent to your provider as well.   To learn more about what you can do with MyChart, go to [email or web address].    Your next appointment:   6 month(s)  The format for your next appointment:   In Person  Provider:   Oswaldo Milian, MD

## 2019-11-11 ENCOUNTER — Ambulatory Visit (INDEPENDENT_AMBULATORY_CARE_PROVIDER_SITE_OTHER): Payer: Medicare PPO

## 2019-11-11 DIAGNOSIS — I4819 Other persistent atrial fibrillation: Secondary | ICD-10-CM | POA: Diagnosis not present

## 2019-11-19 ENCOUNTER — Other Ambulatory Visit: Payer: Self-pay

## 2019-11-19 ENCOUNTER — Ambulatory Visit (INDEPENDENT_AMBULATORY_CARE_PROVIDER_SITE_OTHER)
Admission: RE | Admit: 2019-11-19 | Discharge: 2019-11-19 | Disposition: A | Discharge status: Home / Self Care | Payer: Medicare PPO | Source: Ambulatory Visit | Attending: Internal Medicine | Admitting: Internal Medicine

## 2019-11-19 ENCOUNTER — Telehealth: Payer: Self-pay | Admitting: Gastroenterology

## 2019-11-19 ENCOUNTER — Telehealth: Payer: Self-pay

## 2019-11-19 ENCOUNTER — Encounter: Payer: Self-pay | Admitting: Internal Medicine

## 2019-11-19 ENCOUNTER — Telehealth: Payer: Self-pay | Admitting: Internal Medicine

## 2019-11-19 ENCOUNTER — Ambulatory Visit: Payer: Medicare PPO | Admitting: Internal Medicine

## 2019-11-19 VITALS — BP 100/60 | HR 76 | Ht 64.5 in | Wt 118.0 lb

## 2019-11-19 DIAGNOSIS — K900 Celiac disease: Secondary | ICD-10-CM

## 2019-11-19 DIAGNOSIS — K582 Mixed irritable bowel syndrome: Secondary | ICD-10-CM

## 2019-11-19 DIAGNOSIS — R197 Diarrhea, unspecified: Secondary | ICD-10-CM

## 2019-11-19 DIAGNOSIS — R159 Full incontinence of feces: Secondary | ICD-10-CM | POA: Diagnosis not present

## 2019-11-19 DIAGNOSIS — M419 Scoliosis, unspecified: Secondary | ICD-10-CM | POA: Diagnosis not present

## 2019-11-19 NOTE — Telephone Encounter (Signed)
Patient has an appt with you on 8/5.  She reports that she had 12 episodes of watery diarrhea this morning from 1:30 to 8:30.  She reports that the watery diarrhea has been present for 1 week.  She is taking 2 imodium in the am and using the lomotil PRN. No new medications or antibiotics.

## 2019-11-19 NOTE — Telephone Encounter (Signed)
She will be there

## 2019-11-19 NOTE — Progress Notes (Signed)
[name] 79 y.o. 1940-09-20 [id number]  Assessment & Plan:   Encounter Diagnoses  Name Primary?  . Diarrhea, unspecified type Yes  . Irritable bowel syndrome with both constipation and diarrhea   . Full incontinence of feces   . 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    Infection, pancreatic insufficiency, inflammatory problems appear to have been ruled out during this recent episode.  This is been ongoing for several months but she has had problems off and on.  I did ask her if she remained gluten-free she had thought she did better off gluten in the past.  I have trouble understanding exactly how she is taking her medications and adherence is an issue and it seems to me that she has difficulty remembering her medications and what they are for at times.  Question if there is a background of memory disturbance not elucidated so far.  Definite quality of life disturbance here with the bowel habits and the incontinence.  I am going to check an x-ray of the abdomen if she is full of stool I think a purge may be warranted.  She has a concert to go to tomorrow night and is very concerned about making it to that though since she only seems to defecate several hours after eating but will do that at night because she will eat later at night to take medications, if she avoids food she may do okay without any incontinence if she goes to the concert.  This was explained to her.   Based upon the normal calprotectin in the stool and previous lack of response to budesonide I am not inclined to retry that right now.  Regarding the possibility of celiac disease she had a mildly elevated TTG antibody in the past she declined EGD and biopsy.  CC: [name], MD   My reading was sig amount of stool in distal colon - so I had her purge with dulcolax and Miralax - plan is to stay off anti-diarrheals x next 2-3 days at least, hopefully and see how she does and determine regimen after that.    Subjective:    Chief Complaint: Diarrhea  HPI [name] called the office today with further complaints of diarrhea.  She was worked into a Lawyer.  Husband [name] is with her today.  This is been a problem for months.  She had called in in June and fecal elastase and fecal calprotectin were normal.  So was a GI pathogen panel on June 07. I had previously recommended cholestyramine but she does not recall taking that.  She describes numerous bowel movements several hours after eating.  Slight rectal bleeding at times, she is on Eliquis.  There may be a small tinge of orange oily substance in the bowl she says after she flushes.  However I do not get any sense for a history of [place].  She has a history of lymphocytic colitis and IBS mixed pattern.  Weights as below.  She is trying to use Lomotil generic and loperamide to control things.  She is having fecal incontinence as well.  She does not seem to recall any issue with constipation Wt Readings from Last 3 Encounters:  11/19/19 118 lb (53.5 kg)  11/03/19 118 lb 9.6 oz (53.8 kg)  09/10/19 122 lb 9.6 oz (55.6 kg)     Allergies  Allergen Reactions  . Cox-2 Inhibitors Nausea And Vomiting    Severe abd pain  . Glucose Diarrhea    cramps  .  Lactose Intolerance (Gi) Diarrhea    Stomach cramps   Current Meds  Medication Sig  . acetaminophen (TYLENOL) 500 MG tablet Take 500-1,000 mg by mouth every 8 (eight) hours as needed for moderate pain or headache.   Marland Kitchen apixaban (ELIQUIS) 2.5 MG TABS tablet Take 1 tablet (2.5 mg total) by mouth 2 (two) times daily.  . calcium carbonate (TUMS EX) 750 MG chewable tablet Chew 1,500 mg by mouth 2 (two) times daily.   . Cholecalciferol (DIALYVITE VITAMIN D 5000) 125 MCG (5000 UT) capsule Take 5,000 Units by mouth daily.  . cholestyramine (QUESTRAN) 4 GM/DOSE powder Take 1 packet (4 g total) by mouth daily. NOT TAKING  . clobetasol (OLUX) 0.05 % topical foam Apply 1 application topically every other day.   .  diphenhydrAMINE (BENADRYL) 25 MG tablet Take 25 mg by mouth daily as needed for allergies.   . diphenoxylate-atropine (LOMOTIL) 2.5-0.025 MG tablet 1-2 every 6 hours as needed severe diarrhea  . doxycycline (VIBRAMYCIN) 50 MG capsule Take 50 mg by mouth every Monday, Wednesday, and Friday.  . esomeprazole (NEXIUM) 40 MG capsule TAKE 1 CAPSULE(40 MG) BY MOUTH DAILY BEFORE A MEAL  . finasteride (PROSCAR) 5 MG tablet   . glucosamine-chondroitin 500-400 MG tablet Take 1 tablet by mouth daily.   . hydrocortisone cream 1 % Apply 1 application topically daily as needed for itching (burns).  . InFLIXimab (REMICADE IV) Inject 1 Dose into the vein See admin instructions. Remicade Infusion every 2 months  . Lactase (LACTAID PO) Take by mouth.  . metoprolol tartrate (LOPRESSOR) 25 MG tablet Take 1 tablet in the AM and 1.5 tablet in the PM  . minoxidil (ROGAINE) 2 % external solution Apply 1 application topically 2 (two) times daily.  . Multiple Vitamins-Minerals (PRESERVISION AREDS 2) CAPS Take 1 capsule by mouth 2 (two) times daily.  Marland Kitchen neomycin-bacitracin-polymyxin (NEOSPORIN) ointment Apply 1 application topically as needed for wound care.  . NON FORMULARY Gluten Buster  . Polyethyl Glycol-Propyl Glycol (SYSTANE) 0.4-0.3 % GEL ophthalmic gel Place 1 application into both eyes at bedtime.  Marland Kitchen Propylene Glycol (SYSTANE COMPLETE) 0.6 % SOLN Place 1 drop into both eyes 3 (three) times daily.  . rosuvastatin (CRESTOR) 5 MG tablet One po 3 times a week  . SOOLANTRA 1 % CREA Apply 1 application topically daily.   Marland Kitchen spironolactone (ALDACTONE) 50 MG tablet   . Tolnaftate (ANTIFUNGAL EX) Apply 1 application topically 2 (two) times daily. Formula 3 antifungal liquid   Past Medical History:  Diagnosis Date  . Allergy    seasonal  . Anemia   . Cataract    bil cateracts removed  . Chronic constipation   . Diarrhea    chronic diarrheafor over 2 months  . GERD (gastroesophageal reflux disease)   .  Hyperlipidemia   . 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 Rheumatoid arthritis ([place])   . Status post dilation of esophageal narrowing 2014  . Wears glasses    Past Surgical History:  Procedure Laterality Date  . CARDIOVERSION N/A 05/11/2019   Procedure: CARDIOVERSION;  Surgeon: Buford Dresser, MD;  Location: [facility];  Service: Cardiovascular;  Laterality: N/A;  . CATARACT EXTRACTION  06/30/2016 -right eye   left eye in 08/15/16  . COLONOSCOPY    . DILATION AND CURETTAGE OF UTERUS  2009  . ESOPHAGEAL DILATION  05/31/1997  . REPAIR EXTENSOR TENDON Right 05/14/2013   Procedure: LONG FINGER EXTENSOR TO ELEVATE RING AND SMALL FINGERS ;  Surgeon: Herbie Baltimore  Christena Flake., MD;  Location: [place];  Service: Orthopedics;  Laterality: Right;  . seborrheic keratosis  09/03/12   inflamed, excised from forehead  . TENDON TRANSFER Right 09/03/2013   Procedure: RIGHT HAND FLEXOR CARPI RADIALUS TRANSFER TO LONG/RING/SMALL;  Surgeon: Cammie Sickle., MD;  Location: [place];  Service: Orthopedics;  Laterality: Right;  . TONSILLECTOMY    . TUBAL LIGATION    . ULNAR HEAD EXCISION Right 05/14/2013   Procedure: ARTHOPLASTY DISTAL RADIAL ULNAR JOINT, RIGHT ;  Surgeon: Cammie Sickle., MD;  Location: [place];  Service: Orthopedics;  Laterality: Right;  . UPPER GASTROINTESTINAL ENDOSCOPY     Social History   Social History Narrative   Health Care POA: Husband, Fred   Emergency Contact: Fred   End of Life Plan:    Who lives with you: husband   Son, in [place]   Any pets: 3 cats   Diet: Pt has a varied diet of protein, starch, and vegetables.   Exercise: Pt works with trainer 2x a week for 60 minutes   Seatbelts: Pt reports wearing seatbelt when in vehicle.   Sun Exposure/Protection: Pt reports not using sun protection.   Hobbies: gardening, reading, concerts               family history includes COPD in her  father; Colon cancer in her paternal uncle; Heart disease in her mother; Osteoporosis in her maternal aunt, maternal grandmother, and mother.   Review of Systems As per HPI  Objective:   Physical Exam BP (!) 100/60   Pulse 76   Ht 5' 4.5" (1.638 m)   Wt 118 lb (53.5 kg)   BMI 19.94 kg/m  Thin no acute distress  Quintin Alto, CMA present  On rectal exam essentially normal anoderm with some small fleshy tags, decreased resting and voluntary sphincter tone trace rectocele.  Simulated defecation reveals reduced descent and appropriate abdominal contraction.  There is no fecal impaction.  There is some flecks of formed brown stool present.

## 2019-11-19 NOTE — Patient Instructions (Signed)
  Please go to our x-ray department before leaving today.    When you go home please look and see if you have packets of cholestyramine.     I appreciate the opportunity to care for you. Silvano Rusk, MD, [facility]

## 2019-11-19 NOTE — Telephone Encounter (Signed)
Patient also told not to take her anti-diarrhea medicines while doing this purge. She verbalized understanding.

## 2019-11-19 NOTE — Telephone Encounter (Signed)
-----   Message from Gatha Mayer, MD sent at 11/19/2019  4:41 PM EDT ----- Regarding: xray results I looked at xray and think there is a good amount of stool up above the rectum  I recommend that she do the following tonight  4 dulcolax wait 1 hour and drink 4 doses of MiraLax over 60-90 mins (1 scoop or packet or tablespoon in 6 oz water) and then 2 more glasses of water  She is to call back with results of that tomorrow  I ask that she record the number of stools and amount and consistency if she can

## 2019-11-19 NOTE — Telephone Encounter (Signed)
Please see if she will see me in the open banding slot today

## 2019-11-19 NOTE — Telephone Encounter (Signed)
L[facility]I: Dr. Carlean Purl  After hours call. Answering service mentioned that the call was about a prescription.   I attempted to return the patient's call at 6:30pm. Call went to voicemail. I left a message.  2nd attempt at returning the patient's call at 6:40. She noted that she had spoken with Dr. Carlean Purl since calling the answering service and did not need any additional assistance.

## 2019-11-19 NOTE — Telephone Encounter (Signed)
Patient informed and will try her best. She wrote down the instructions. She was unsure if she could do it tonight but will try.

## 2019-11-20 NOTE — Telephone Encounter (Signed)
Patient calls to report the results of the purge. She had 9 bowel movements, no bleeding, they were a mixture of liquid and solids, some soft, large amounts and at the end she said it looked like she had done a colonoscopy prep-watery.   Today she said she feels fine and ate breakfast. She said we can call her cell # of [phone] with further instructions.

## 2019-11-20 NOTE — Telephone Encounter (Signed)
Th plan is to see if this resets things so that she does NOT need Imodium or diphenoxylate and atropine  I want her to try and NOT take those for the next 3 days and to record number and consistency of stools and report back on Monday about these.

## 2019-11-20 NOTE — Telephone Encounter (Signed)
Patient informed and will try this and contact us Monday.

## 2019-11-23 ENCOUNTER — Telehealth: Payer: Self-pay | Admitting: Cardiology

## 2019-11-23 NOTE — Telephone Encounter (Signed)
 *  STAT* If patient is at the pharmacy, call can be transferred to refill team.   1. Which medications need to be refilled? (please list name of each medication and dose if known)  metoprolol tartrate (LOPRESSOR) 25 MG tablet  2. Which pharmacy/location (including street and city if local pharmacy) is medication to be sent to?  [id number]- [place], [place] - [address]  3. Do they need a 30 day or 90 day supply? 90  Since the dosage of the medication changes the patient has run out of her medication faster. She needs an updated rx sent to the pharmacy

## 2019-11-23 NOTE — Telephone Encounter (Signed)
[name] called in at 2:30pm to report the following: she didn't take any of he anti-diarrhea meds over the weekend as instructed to do. Friday PM she had 9 bowel movements, Saturaday 9 bowel movements, and Sunday 0 bowel movements, Monday  7 so far (today she took 2 generic lomotil because she had to go out).  The consistency has been thick and runny, no solids, no blood. She said it's been a large amount.  She is requesting her lomotil be refilled Sir.    She is aware your out of the office and will check in.

## 2019-11-24 DIAGNOSIS — M0609 Rheumatoid arthritis without rheumatoid factor, multiple sites: Secondary | ICD-10-CM | POA: Diagnosis not present

## 2019-11-24 MED ORDER — METOPROLOL TARTRATE 25 MG PO TABS: ORAL_TABLET | ORAL | 3 refills | Status: DC

## 2019-11-24 NOTE — Telephone Encounter (Signed)
Left a message that I will try her back later on today.

## 2019-11-24 NOTE — Telephone Encounter (Signed)
The next thing to do is to start Questran 1 packet at bedtime/at night  Need to take at leat 1 hour after other meds so take it alone  She has this on her list but may need a refill

## 2019-11-25 NOTE — Telephone Encounter (Signed)
Patient will be here.

## 2019-11-25 NOTE — Telephone Encounter (Signed)
Our computers went down late in the day and I was unable to speak to [name] until this AM. She has been informed of the plan and wrote it down. She wants to know if she still needs to keep her August 5th appointment with you Sir?

## 2019-11-25 NOTE — Telephone Encounter (Signed)
I think she should if she can

## 2019-11-26 ENCOUNTER — Telehealth: Payer: Self-pay | Admitting: Gastroenterology

## 2019-11-26 MED ORDER — DIPHENOXYLATE-ATROPINE 2.5-0.025 MG PO TABS: ORAL_TABLET | ORAL | 0 refills | Status: DC

## 2019-11-26 NOTE — Telephone Encounter (Signed)
I left her a detailed message with instructions.

## 2019-11-26 NOTE — Telephone Encounter (Signed)
[name] called in to report that quickly after taking the Questran at 11:30pm last night she started having diarrhea until 4:30AM, 12-13 times.  She has taken 2 Imodium and also her lomotil as the diarrhea started again this AM at 7. She thinks the medicine is working to slow it down.  She is requesting a refill on the Lomotil. I did inquire as to what she ate for supper last night and she said it was baked fish, wine, potato chips, lettuce and dip.  She is sorry to bother you she said.

## 2019-11-26 NOTE — Telephone Encounter (Signed)
I refilled it.  Stay with the cholestyramine still - hopefully will get better  She was right to call - not a problem  Sorry she is still suffering

## 2019-12-02 DIAGNOSIS — I4819 Other persistent atrial fibrillation: Secondary | ICD-10-CM | POA: Diagnosis not present

## 2019-12-02 NOTE — Telephone Encounter (Signed)
[name] is coming in tomorrow.

## 2019-12-03 ENCOUNTER — Ambulatory Visit: Payer: Medicare PPO | Admitting: Internal Medicine

## 2019-12-03 ENCOUNTER — Encounter: Payer: Self-pay | Admitting: Internal Medicine

## 2019-12-03 VITALS — BP 100/70 | HR 64 | Ht 63.5 in | Wt 118.1 lb

## 2019-12-03 DIAGNOSIS — R159 Full incontinence of feces: Secondary | ICD-10-CM

## 2019-12-03 DIAGNOSIS — K900 Celiac disease: Secondary | ICD-10-CM

## 2019-12-03 DIAGNOSIS — K580 Irritable bowel syndrome with diarrhea: Secondary | ICD-10-CM

## 2019-12-03 DIAGNOSIS — K52832 Lymphocytic colitis: Secondary | ICD-10-CM

## 2019-12-03 DIAGNOSIS — R413 Other amnesia: Secondary | ICD-10-CM

## 2019-12-03 NOTE — Patient Instructions (Addendum)
Try taking 1-2 Lomotil in the early evening and continue 2 Lomotil, wait 1 hour and take the cholestyramine.  We will refer you back to PT.  We will see you October 7th at 8:50AM.  I appreciate the opportunity to care for you. Gatha Mayer, MD, [facility]

## 2019-12-03 NOTE — Progress Notes (Signed)
[name] 79 y.o. 10-31-1940 [id number]  Assessment & Plan:   Encounter Diagnoses  Name Primary?  . Irritable bowel syndrome with diarrhea Yes  . Lymphocytic colitis w/ patchy collagen deposition   . 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   . Full incontinence of feces   . Memory disturbance?    I think though I cannot say I am certain she is improved.  We will continue with her regimen of the 2 Lomotil wait an hour and take the cholestyramine.  That was started last week and seems to be helping.  She asked about taking 3 Lomotil at a time.  She is 79 years old she is tolerating the Lomotil I rather not use that but we have had a terribly difficult time controlling her diarrhea and I think that is the best we can do.  I have suggested she take 1 or 2 prior to eating maybe an hour or 2 before her evening meal rather than taking 3 at a time at bedtime.  She has a physical coming in a few months with Dr. Renold James, I asked her to discuss possible memory disturbance issues with Dr. Renold James as I get a sense there could be some ongoing here though not certain.  I will also refer her back for a physical therapy reassessment and consider additional therapy and a refresher course so to speak to see if that makes a difference because that did not really seem to help quite a bit back in 2019.  I will see her again in October  I appreciate the opportunity to care for this patient. CC: Veronica Showers, MD   Subjective:   Chief Complaint:  HPI [name] is an 79 year old white woman with diarrhea predominant irritable bowel syndrome, lymphocytic colitis with patchy collagen deposition, possible celiac disease and pelvic floor dysfunction who has been struggling with diarrhea problems.  Multiple phone in my chart interactions of late with last visit November 19, 2019.  At that time though I did not detect an impaction based upon some stool in the rectosigmoid area on a film (my interpretation of the x-ray) I had  her do a purge in case she had some sort of high impaction.  That might of helped a little bit but she has ended up on cholestyramine 4 g at bedtime she is taking 2 Lomotil generic an hour before she takes that.  That has improved things but she still has concerns about fecal incontinence and urgency etc.  Note that a trial of repeat budesonide was unhelpful and that fecal calprotectin and stool studies have all been negative of late.  She does say she is doing home exercises as per PT recommendations from 2019.  The PT was helpful in controlling urgency and incontinence then.  She continues to remain gluten and lactose free.   She does admit to some memory issues, sometimes when she is describing things it seems like we go back and forth and the stories are not entirely consistent at times.  Note that she has had an unusual pattern of defecation overnight, while sleeping.  She has not been sleeping with her husband because of this issue that is hoping to get back to that and has some.  They eat very late at about 9 PM and she goes to bed in the early hours of the morning. Allergies  Allergen Reactions  . Cox-2 Inhibitors Nausea And Vomiting    Severe abd pain  . Glucose Diarrhea    cramps  .  Lactose Intolerance (Gi) Diarrhea    Stomach cramps   Current Meds  Medication Sig  . acetaminophen (TYLENOL) 500 MG tablet Take 500-1,000 mg by mouth every 8 (eight) hours as needed for moderate pain or headache.   Marland Kitchen apixaban (ELIQUIS) 2.5 MG TABS tablet Take 1 tablet (2.5 mg total) by mouth 2 (two) times daily.  . calcium carbonate (TUMS EX) 750 MG chewable tablet Chew 1,500 mg by mouth 2 (two) times daily.   . Cholecalciferol (DIALYVITE VITAMIN D 5000) 125 MCG (5000 UT) capsule Take 5,000 Units by mouth daily.  . cholestyramine (QUESTRAN) 4 GM/DOSE powder Take 1 packet (4 g total) by mouth daily.  . clobetasol (OLUX) 0.05 % topical foam Apply 1 application topically every other day.   .  diphenoxylate-atropine (LOMOTIL) 2.5-0.025 MG tablet 1-2 every 6 hours as needed for diarrhea  . doxycycline (VIBRAMYCIN) 50 MG capsule Take 50 mg by mouth every Monday, Wednesday, and Friday.  . esomeprazole (NEXIUM) 40 MG capsule TAKE 1 CAPSULE(40 MG) BY MOUTH DAILY BEFORE A MEAL  . finasteride (PROSCAR) 5 MG tablet   . glucosamine-chondroitin 500-400 MG tablet Take 1 tablet by mouth daily.   . hydrocortisone cream 1 % Apply 1 application topically daily as needed for itching (burns).  . InFLIXimab (REMICADE IV) Inject 1 Dose into the vein See admin instructions. Remicade Infusion every 2 months  . Lactase (LACTAID PO) Take by mouth.  . metoprolol tartrate (LOPRESSOR) 25 MG tablet Take 1 tablet in the AM and 1.5 tablet in the PM  . minoxidil (ROGAINE) 2 % external solution Apply 1 application topically 2 (two) times daily.  . Multiple Vitamins-Minerals (PRESERVISION AREDS 2) CAPS Take 1 capsule by mouth 2 (two) times daily.  Marland Kitchen neomycin-bacitracin-polymyxin (NEOSPORIN) ointment Apply 1 application topically as needed for wound care.  . NON FORMULARY Gluten Buster  . Polyethyl Glycol-Propyl Glycol (SYSTANE) 0.4-0.3 % GEL ophthalmic gel Place 1 application into both eyes at bedtime.  Marland Kitchen Propylene Glycol (SYSTANE COMPLETE) 0.6 % SOLN Place 1 drop into both eyes 3 (three) times daily.  . rosuvastatin (CRESTOR) 5 MG tablet One po 3 times a week  . SOOLANTRA 1 % CREA Apply 1 application topically daily.   Marland Kitchen spironolactone (ALDACTONE) 50 MG tablet   . Tolnaftate (ANTIFUNGAL EX) Apply 1 application topically 2 (two) times daily. Formula 3 antifungal liquid   Past Medical History:  Diagnosis Date  . Allergy    seasonal  . Anemia   . Cataract    bil cateracts removed  . Chronic constipation   . Diarrhea    chronic diarrheafor over 2 months  . GERD (gastroesophageal reflux disease)   . Hyperlipidemia   . 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 Rheumatoid arthritis ([place])     . Status post dilation of esophageal narrowing 2014  . Wears glasses    Past Surgical History:  Procedure Laterality Date  . CARDIOVERSION N/A 05/11/2019   Procedure: CARDIOVERSION;  Surgeon: Buford Dresser, MD;  Location: [facility]COPY;  Service: Cardiovascular;  Laterality: N/A;  . CATARACT EXTRACTION  06/30/2016 -right eye   left eye in 08/15/16  . COLONOSCOPY    . DILATION AND CURETTAGE OF UTERUS  2009  . ESOPHAGEAL DILATION  05/31/1997  . REPAIR EXTENSOR TENDON Right 05/14/2013   Procedure: LONG FINGER EXTENSOR TO ELEVATE RING AND SMALL FINGERS ;  Surgeon: Cammie Sickle., MD;  Location: [place];  Service: Orthopedics;  Laterality: Right;  . seborrheic  keratosis  09/03/12   inflamed, excised from forehead  . TENDON TRANSFER Right 09/03/2013   Procedure: RIGHT HAND FLEXOR CARPI RADIALUS TRANSFER TO LONG/RING/SMALL;  Surgeon: Cammie Sickle., MD;  Location: [place];  Service: Orthopedics;  Laterality: Right;  . TONSILLECTOMY    . TUBAL LIGATION    . ULNAR HEAD EXCISION Right 05/14/2013   Procedure: ARTHOPLASTY DISTAL RADIAL ULNAR JOINT, RIGHT ;  Surgeon: Cammie Sickle., MD;  Location: [place];  Service: Orthopedics;  Laterality: Right;  . UPPER GASTROINTESTINAL ENDOSCOPY     Social History   Social History Narrative   Health Care POA: Husband, Fred   Emergency Contact: Fred   End of Life Plan:    Who lives with you: husband   Son, in [place]   Any pets: 3 cats   Diet: Pt has a varied diet of protein, starch, and vegetables.   Exercise: Pt works with trainer 2x a week for 60 minutes   Seatbelts: Pt reports wearing seatbelt when in vehicle.   Sun Exposure/Protection: Pt reports not using sun protection.   Hobbies: gardening, reading, concerts               family history includes COPD in her father; Colon cancer in her paternal uncle; Heart disease in her mother; Osteoporosis in her maternal aunt, maternal  grandmother, and mother.   Review of Systems As per HPI  Objective:   Physical Exam BP 100/70 (BP Location: Left Arm, Patient Position: Sitting, Cuff Size: Normal)   Pulse 64 Comment: irregular  Ht 5' 3.5" (1.613 m) Comment: height measured without shoes  Wt 118 lb 2 oz (53.6 kg)   BMI 20.60 kg/m    Total time 32 minutes

## 2019-12-07 ENCOUNTER — Telehealth: Payer: Self-pay

## 2019-12-07 NOTE — Telephone Encounter (Signed)
OK later this week

## 2019-12-07 NOTE — Telephone Encounter (Signed)
Patient called,she saw her gastroenterologist Dr. Carlean Purl on Thursday and he suggested that she comes in to see you for a memory evaluation bc she wasn't remembering some things that they had discussed at her last visit. Please advise.   [phone]

## 2019-12-14 ENCOUNTER — Other Ambulatory Visit: Payer: Self-pay

## 2019-12-14 ENCOUNTER — Encounter: Payer: Self-pay | Admitting: Internal Medicine

## 2019-12-14 ENCOUNTER — Ambulatory Visit: Payer: Medicare PPO | Admitting: Internal Medicine

## 2019-12-14 VITALS — BP 100/70 | HR 77 | Ht 63.5 in | Wt 122.0 lb

## 2019-12-14 DIAGNOSIS — R413 Other amnesia: Secondary | ICD-10-CM

## 2019-12-14 LAB — VITAMIN B12: Vitamin B-12: 809 pg/mL (ref 200–1100)

## 2019-12-14 LAB — TSH: TSH: 3.07 mIU/L (ref 0.40–4.50)

## 2019-12-14 LAB — FOLATE: Folate: 22.3 ng/mL

## 2019-12-20 NOTE — Progress Notes (Signed)
   Subjective:    Patient ID: [name], female    DOB: 11/02/1940, 79 y.o.   MRN: [id number]  HPI 79 year old Female seen for evaluation of memory.  Dr. Carlean Purl saw her on August 5 regarding irritable bowel syndrome and history of lymphocytic colitis.  Currently taking 2 tablets of Lomotil and then taking cholestyramine.  Budesonide has not been helpful in controlling diarrhea.  Apparently has been incontinent of stool at night sometimes.  She and her husband eat late at night around 9 PM and she goes to bed in the early hours of the morning.  They do consume alcohol.  She works out with a Clinical research associate regularly.  She has been referred back to physical therapy by Dr. Carlean Purl for help in controlling fecal urgency and incontinence.  He has had last seen her regarding her memory.  She and her husband have done well during the pandemic.  They have been seeing many of their friends.  He continues to read and write daily.  She prepares the meals.  He does not drive.  They have a son that lives in [place].  There is a movie being made about his life as Probation officer, Professor of [employer], and poet.    Review of Systems patient continues to drive without issue.  Has not become lost while driving.  She will admit that occasionally she has a slight with memory but nothing significant that could not be attributed to  age or distraction.  No incidents of concern.  Husband would agree with that I think.  He is present today.     Objective:   Physical Exam Blood pressure 100/70 pulse 77 pulse oximetry 98% weight 122 pounds  Her TSH falls within normal limits at 3.07.  B12 level is normal at 809, and folate is normal at 22.3  She is alert and oriented x3.  Cranial nerves II through XII are grossly intact and there are no focal deficits on brief neurological exam.  She is able to do serial sevens better than I can.  She can remember 3 items after 5 minutes.  Her judgment and affect appear to be normal and speaking  with her at length.     Assessment & Plan:  I think her memory is okay at this point.  She has no evidence of hypothyroidism, B12 or folate deficiency.  Her memory is actually quite sharp in terms of doing serial sevens and remembering 3 items after 5 minutes.  However she knew she was being tested and was eager to do well.  However, we will continue to monitor her for memory issues.  I do not see a reason to start her on Aricept at this point in time.  Time spent reviewing medical records, interviewing patient and her husband and conducting memory testing is 30 minutes.

## 2019-12-20 NOTE — Patient Instructions (Signed)
He performed very well on memory test in the office today.  If symptoms persist we can refer you for neuropsychological testing with Dr. Sima Matas.  Continues taking  vitamins and exercising.  It was a pleasure to see you today.

## 2019-12-21 ENCOUNTER — Telehealth: Payer: Self-pay | Admitting: Internal Medicine

## 2019-12-21 NOTE — Telephone Encounter (Signed)
She said tell Dr Carlean Purl thank you so much for his help. She said she is not in pain. She had a bowel movement this AM but had to strain.  She also wanted you to know that she went to see Dr Renold Genta about her memory and she did little test and she said they had great laughs. She said I think I'm okay.

## 2019-12-21 NOTE — Telephone Encounter (Signed)
Left message with her husband for her to call me back.

## 2019-12-21 NOTE — Telephone Encounter (Signed)
[name] is experiencing severe constipation and is calling to inquire if she can adjust her medicine: she takes one lomotil at 4pm and one at 10pm. She takes one dose of cholestyramine at 11pm.    Also may she use Lactaid and have alittle cheese?

## 2019-12-21 NOTE — Telephone Encounter (Signed)
  Stop Lomotil and cholestyramine When bowel movements start to come back restart cholestyramine  Please clarify what she means by severe constipation  How many days since a stool? Is she in pain?

## 2019-12-23 ENCOUNTER — Encounter: Payer: Self-pay | Admitting: Podiatry

## 2019-12-23 ENCOUNTER — Other Ambulatory Visit: Payer: Self-pay

## 2019-12-23 ENCOUNTER — Ambulatory Visit: Payer: Medicare PPO | Admitting: Podiatry

## 2019-12-23 DIAGNOSIS — M79675 Pain in left toe(s): Secondary | ICD-10-CM

## 2019-12-23 DIAGNOSIS — B351 Tinea unguium: Secondary | ICD-10-CM

## 2019-12-23 DIAGNOSIS — M79674 Pain in right toe(s): Secondary | ICD-10-CM | POA: Diagnosis not present

## 2019-12-27 NOTE — Progress Notes (Signed)
Subjective: [name] is a 79 y.o. female patient seen today long term blood thinner, Eliquis, and presents today with painful, discolored, thick toenails which interfere with daily activities.  Patient Active Problem List   Diagnosis Date Noted  . Secondary hypercoagulable state ([place]) 07/07/2019  . Persistent atrial fibrillation ([place])   . Nail complaint 10/22/2017  . Hammertoe of right foot 10/22/2017  . 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 10/16/2017  . 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 Dysuria 09/05/2017  . 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 Right hip pain 02/24/2016  . Rheumatoid arthritis ([place]) 06/22/2015  . Knee pain, right 06/22/2015  . Degenerative tear of medial meniscus of left knee 11/25/2014  . Right wrist pain 04/02/2013  . Double vision 08/26/2012  . H/O long-term treatment with high-risk medication 11/08/2011  . Achilles tendon disorder 10/30/2011  . Osteoarthritis of left ankle 09/21/2011  . Tinnitus 09/11/2011  . Hip pain, left 12/14/2010  . ALLERGIC RHINITIS, CHRONIC 11/23/2008  . CONSTIPATION, CHRONIC 11/23/2008  . ACNE, ROSACEA 11/23/2008  . At risk for osteopenia 11/11/2007  . Metatarsalgia of both feet 08/27/2006  . PLANTAR FACIITIS 08/27/2006  . FOOT PAIN, BILATERAL 08/27/2006  . HYPERLIPIDEMIA 06/27/2006  . GASTROESOPHAGEAL REFLUX, NO ESOPHAGITIS 06/27/2006  . OSTEOARTHRITIS, MULTI SITES 06/27/2006    Current Outpatient Medications on File Prior to Visit  Medication Sig Dispense Refill  . acetaminophen (TYLENOL) 500 MG tablet Take 500-1,000 mg by mouth every 8 (eight) hours as needed for moderate pain or headache.     Marland Kitchen apixaban (ELIQUIS) 2.5 MG TABS tablet Take 1 tablet (2.5 mg total) by mouth 2 (two) times daily. 180 tablet 3  . calcium carbonate (TUMS EX) 750 MG chewable tablet Chew 1,500 mg by mouth 2 (two) times daily.     . Cholecalciferol (DIALYVITE VITAMIN D 5000) 125 MCG (5000 UT) capsule Take 5,000  Units by mouth daily.    . cholestyramine (QUESTRAN) 4 g packet     . cholestyramine (QUESTRAN) 4 GM/DOSE powder Take 1 packet (4 g total) by mouth daily. 378 g 1  . clobetasol (OLUX) 0.05 % topical foam Apply 1 application topically every other day.   1  . Clobetasol Propionate 0.05 % shampoo     . diphenoxylate-atropine (LOMOTIL) 2.5-0.025 MG tablet 1-2 every 6 hours as needed for diarrhea 90 tablet 0  . doxycycline (VIBRAMYCIN) 50 MG capsule Take 50 mg by mouth every Monday, Wednesday, and Friday.    . esomeprazole (NEXIUM) 40 MG capsule TAKE 1 CAPSULE(40 MG) BY MOUTH DAILY BEFORE A MEAL 90 capsule 3  . finasteride (PROSCAR) 5 MG tablet     . glucosamine-chondroitin 500-400 MG tablet Take 1 tablet by mouth daily.     . hydrocortisone cream 1 % Apply 1 application topically daily as needed for itching (burns).    . InFLIXimab (REMICADE IV) Inject 1 Dose into the vein See admin instructions. Remicade Infusion every 2 months    . Lactase (LACTAID PO) Take by mouth.    . metoprolol tartrate (LOPRESSOR) 25 MG tablet Take 1 tablet in the AM and 1.5 tablet in the PM 270 tablet 3  . minoxidil (ROGAINE) 2 % external solution Apply 1 application topically 2 (two) times daily.    . Multiple Vitamins-Minerals (PRESERVISION AREDS 2) CAPS Take 1 capsule by mouth 2 (two) times daily.    Marland Kitchen neomycin-bacitracin-polymyxin (NEOSPORIN) ointment Apply 1 application topically as needed for wound care.    . NON FORMULARY  Gluten Buster    . Polyethyl Glycol-Propyl Glycol (SYSTANE) 0.4-0.3 % GEL ophthalmic gel Place 1 application into both eyes at bedtime.    Marland Kitchen Propylene Glycol (SYSTANE COMPLETE) 0.6 % SOLN Place 1 drop into both eyes 3 (three) times daily.    . rosuvastatin (CRESTOR) 5 MG tablet One po 3 times a week 36 tablet 3  . SOOLANTRA 1 % CREA Apply 1 application topically daily.   1  . Tolnaftate (ANTIFUNGAL EX) Apply 1 application topically 2 (two) times daily. Formula 3 antifungal liquid     No current  facility-administered medications on file prior to visit.    Allergies  Allergen Reactions  . Cox-2 Inhibitors Nausea And Vomiting    Severe abd pain  . Glucose Diarrhea    cramps  . Lactose Intolerance (Gi) Diarrhea    Stomach cramps  Objective: Physical Exam  General: [name] is a pleasant 79 y.o. Caucasian female, WD, WN in NAD. AAO x 3.   Vascular:  Neurovascular status unchanged b/l. Capillary refill time to digits immediate b/l. Palpable DP pulses b/l. Palpable PT pulses b/l. Pedal hair present b/l. Skin temperature gradient within normal limits b/l.  Dermatological:  Pedal skin with normal turgor, texture and tone bilaterally. No open wounds bilaterally. No interdigital macerations bilaterally. Toenails 1-5 b/l elongated, discolored, dystrophic, thickened, crumbly with subungual debris and tenderness to dorsal palpation.  Musculoskeletal:  Normal muscle strength 5/5 to all lower extremity muscle groups bilaterally. No gross bony deformities bilaterally. No pain crepitus or joint limitation noted with ROM b/l. Patient ambulates independent of any assistive aids.  Neurological:  Protective sensation intact 5/5 intact bilaterally with 10g monofilament b/l. Vibratory sensation intact b/l. Proprioception intact bilaterally.  Assessment and Plan:  1. Pain due to onychomycosis of toenails of both feet    -Examined patient. -No new findings. No new orders. -Toenails 1-5 b/l were debrided in length and girth with sterile nail nippers and dremel without iatrogenic bleeding.  -Patient to continue soft, supportive shoe gear daily. -Patient to report any pedal injuries to medical professional immediately. -Patient/POA to call should there be question/concern in the interim.  Return in about 3 months (around 03/24/2020) for nail trim/ Eliquis.  Marzetta Board, DPM

## 2019-12-30 DIAGNOSIS — M25572 Pain in left ankle and joints of left foot: Secondary | ICD-10-CM | POA: Diagnosis not present

## 2019-12-30 DIAGNOSIS — Z681 Body mass index (BMI) 19 or less, adult: Secondary | ICD-10-CM | POA: Diagnosis not present

## 2019-12-30 DIAGNOSIS — M7989 Other specified soft tissue disorders: Secondary | ICD-10-CM | POA: Diagnosis not present

## 2019-12-30 DIAGNOSIS — M150 Primary generalized (osteo)arthritis: Secondary | ICD-10-CM | POA: Diagnosis not present

## 2019-12-30 DIAGNOSIS — M0609 Rheumatoid arthritis without rheumatoid factor, multiple sites: Secondary | ICD-10-CM | POA: Diagnosis not present

## 2020-01-12 ENCOUNTER — Other Ambulatory Visit: Payer: Self-pay | Admitting: Internal Medicine

## 2020-01-13 ENCOUNTER — Encounter: Payer: Self-pay | Admitting: Internal Medicine

## 2020-01-13 ENCOUNTER — Ambulatory Visit (INDEPENDENT_AMBULATORY_CARE_PROVIDER_SITE_OTHER): Payer: Medicare PPO | Admitting: Internal Medicine

## 2020-01-13 ENCOUNTER — Other Ambulatory Visit: Payer: Self-pay

## 2020-01-13 VITALS — BP 110/80 | HR 77 | Temp 97.9°F | Ht 63.5 in | Wt 122.0 lb

## 2020-01-13 DIAGNOSIS — Z23 Encounter for immunization: Secondary | ICD-10-CM

## 2020-01-13 NOTE — Progress Notes (Signed)
Flu vaccine given by CMA 

## 2020-01-13 NOTE — Patient Instructions (Signed)
Patient received a flu shot IM, L deltoid.

## 2020-01-14 ENCOUNTER — Other Ambulatory Visit: Payer: Self-pay | Admitting: Internal Medicine

## 2020-01-19 DIAGNOSIS — M0609 Rheumatoid arthritis without rheumatoid factor, multiple sites: Secondary | ICD-10-CM | POA: Diagnosis not present

## 2020-01-19 DIAGNOSIS — Z79899 Other long term (current) drug therapy: Secondary | ICD-10-CM | POA: Diagnosis not present

## 2020-02-04 ENCOUNTER — Encounter: Payer: Self-pay | Admitting: Internal Medicine

## 2020-02-04 ENCOUNTER — Ambulatory Visit: Payer: Medicare PPO | Admitting: Internal Medicine

## 2020-02-04 VITALS — BP 110/70 | HR 82 | Ht 63.5 in | Wt 115.0 lb

## 2020-02-04 DIAGNOSIS — K52832 Lymphocytic colitis: Secondary | ICD-10-CM | POA: Diagnosis not present

## 2020-02-04 DIAGNOSIS — K580 Irritable bowel syndrome with diarrhea: Secondary | ICD-10-CM | POA: Diagnosis not present

## 2020-02-04 DIAGNOSIS — E739 Lactose intolerance, unspecified: Secondary | ICD-10-CM | POA: Diagnosis not present

## 2020-02-04 DIAGNOSIS — L309 Dermatitis, unspecified: Secondary | ICD-10-CM

## 2020-02-04 DIAGNOSIS — K900 Celiac disease: Secondary | ICD-10-CM | POA: Diagnosis not present

## 2020-02-04 MED ORDER — NYSTATIN-TRIAMCINOLONE 100000-0.1 UNIT/GM-% EX OINT: 1.0000 "application " | TOPICAL_OINTMENT | Freq: Two times a day (BID) | CUTANEOUS | 1 refills | Status: DC

## 2020-02-04 MED ORDER — COLESTIPOL HCL 1 G PO TABS: ORAL_TABLET | ORAL | 2 refills | Status: DC

## 2020-02-04 NOTE — Progress Notes (Signed)
[name] 79 y.o. 1940/10/28 [id number]  Assessment & Plan:   Encounter Diagnoses  Name Primary?  . Irritable bowel syndrome with diarrhea Yes  . Lymphocytic colitis w/ patchy collagen deposition   . 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   . Lactose intolerance   . Perianal dermatitis     She is making progress.  I think IBS is the driving force here at this time though could have some ongoing microscopic colitis issues she has not improved when we have tried budesonide of late.  I am going to have her try colestipol tablets 1 or 2 at bedtime which may help control her diarrhea and reduce or eliminate need for Lomotil without causing constipation since the 4 g dose of cholestyramine caused constipation.  She will continue to avoid lactose or use Lactaid and continue to avoid gluten  For the perianal dermatitis Mycolog ointment and anal tear i.e. avoid excessive wiping try and use a hair dryer on low for dry  Return in 2 months  I appreciate the opportunity to care for this patient. CC: Veronica Showers, MD   Subjective:   Chief Complaint: Follow-up of IBS  HPI [name] presents today for follow-up of her GI complaints that include IBS history of lymphocytic colitis with patchy collagen deposition gluten intolerance/celiac disease suspected and lactose intolerance.  She has had a rough time over the past few months but things seem to be improving.  She got constipated when she resumed cholestyramine.  She stopped that back in August.  I had last seen her in early August and recommended daily cholestyramine.  She has now been using Lomotil 1 or 2 when she has bad diarrhea maybe once or twice a week.  Things seem to be improved.  She is lactose free and remains gluten-free.  Still wishes for a better quality of life though she is improved.  She is complaining of fairly severe anal itching  Allergies  Allergen Reactions  . Cox-2 Inhibitors Nausea And Vomiting    Severe abd pain    . Glucose Diarrhea    cramps  . Lactose Intolerance (Gi) Diarrhea    Stomach cramps   Current Meds  Medication Sig  . acetaminophen (TYLENOL) 500 MG tablet Take 500-1,000 mg by mouth every 8 (eight) hours as needed for moderate pain or headache.   Marland Kitchen apixaban (ELIQUIS) 2.5 MG TABS tablet Take 1 tablet (2.5 mg total) by mouth 2 (two) times daily.  . calcium carbonate (TUMS EX) 750 MG chewable tablet Chew 1,500 mg by mouth 2 (two) times daily.   . Cholecalciferol (DIALYVITE VITAMIN D 5000) 125 MCG (5000 UT) capsule Take 5,000 Units by mouth daily.  . clobetasol (OLUX) 0.05 % topical foam Apply 1 application topically every other day.   . Clobetasol Propionate 0.05 % shampoo   . diphenoxylate-atropine (LOMOTIL) 2.5-0.025 MG tablet 1-2 EVERY 6 HOURS AS NEEDED FOR DIARRHEA  . doxycycline (VIBRAMYCIN) 50 MG capsule Take 50 mg by mouth every Monday, Wednesday, and Friday.  . esomeprazole (NEXIUM) 40 MG capsule TAKE 1 CAPSULE(40 MG) BY MOUTH DAILY BEFORE A MEAL  . finasteride (PROSCAR) 5 MG tablet   . hydrocortisone cream 1 % Apply 1 application topically daily as needed for itching (burns).  . InFLIXimab (REMICADE IV) Inject 1 Dose into the vein See admin instructions. Remicade Infusion every 2 months  . Lactase (LACTAID PO) Take by mouth.  . metoprolol tartrate (LOPRESSOR) 25 MG tablet Take 1 tablet in the AM and 1.5 tablet  in the PM  . minoxidil (ROGAINE) 2 % external solution Apply 1 application topically 2 (two) times daily.  . Multiple Vitamins-Minerals (PRESERVISION AREDS 2) CAPS Take 1 capsule by mouth 2 (two) times daily.  Marland Kitchen neomycin-bacitracin-polymyxin (NEOSPORIN) ointment Apply 1 application topically as needed for wound care.  . NON FORMULARY Gluten Buster  . Polyethyl Glycol-Propyl Glycol (SYSTANE) 0.4-0.3 % GEL ophthalmic gel Place 1 application into both eyes at bedtime.  . rosuvastatin (CRESTOR) 5 MG tablet One po 3 times a week  . SOOLANTRA 1 % CREA Apply 1 application topically  daily.    Past Medical History:  Diagnosis Date  . Allergy    seasonal  . Anemia   . Cataract    bil cateracts removed  . Chronic constipation   . Diarrhea    chronic diarrheafor over 2 months  . GERD (gastroesophageal reflux disease)   . Hyperlipidemia   . 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 Rheumatoid arthritis ([name])   . Status post dilation of esophageal narrowing 2014  . Wears glasses    Past Surgical History:  Procedure Laterality Date  . CARDIOVERSION N/A 05/11/2019   Procedure: CARDIOVERSION;  Surgeon: Veronica Dresser, MD;  Location: [facility] ENDOSCOPY;  Service: Cardiovascular;  Laterality: N/A;  . CATARACT EXTRACTION  06/30/2016 -right eye   left eye in 08/15/16  . COLONOSCOPY    . DILATION AND CURETTAGE OF UTERUS  2009  . ESOPHAGEAL DILATION  05/31/1997  . REPAIR EXTENSOR TENDON Right 05/14/2013   Procedure: LONG FINGER EXTENSOR TO ELEVATE RING AND SMALL FINGERS ;  Surgeon: [name]., MD;  Location: [place];  Service: Orthopedics;  Laterality: Right;  . seborrheic keratosis  09/03/12   inflamed, excised from forehead  . TENDON TRANSFER Right 09/03/2013   Procedure: RIGHT HAND FLEXOR CARPI RADIALUS TRANSFER TO LONG/RING/SMALL;  Surgeon: [name]., MD;  Location: [place];  Service: Orthopedics;  Laterality: Right;  . TONSILLECTOMY    . TUBAL LIGATION    . ULNAR HEAD EXCISION Right 05/14/2013   Procedure: ARTHOPLASTY DISTAL RADIAL ULNAR JOINT, RIGHT ;  Surgeon: [name]., MD;  Location: [place];  Service: Orthopedics;  Laterality: Right;  . UPPER GASTROINTESTINAL ENDOSCOPY     Social History   Social History Narrative   Health Care POA: Husband, [name]   Emergency Contact: [name]   End of Life Plan:    Who lives with you: husband   Son, in [place]   Any pets: 3 cats   Diet: Pt has a varied diet of protein, starch, and vegetables.   Exercise: Pt works with trainer  2x a week for 60 minutes   Seatbelts: Pt reports wearing seatbelt when in vehicle.   Sun Exposure/Protection: Pt reports not using sun protection.   Hobbies: gardening, reading, concerts               family history includes COPD in her father; Colon cancer in her paternal uncle; Heart disease in her mother; Osteoporosis in her maternal aunt, maternal grandmother, and mother.   Review of Systems As per HPI  Objective:   Physical Exam BP 110/70 (BP Location: Left Arm, Patient Position: Sitting, Cuff Size: Normal)   Pulse 82   Ht 5' 3.5" (1.613 m)   Wt 115 lb (52.2 kg)   SpO2 99%   BMI 20.05 kg/m   Genella Mech, CMA present.  Perianal inspection reveals perianal erythema and a  dermatophyte type rash consistent with perianal dermatitis

## 2020-02-04 NOTE — Patient Instructions (Addendum)
We have sent the following medications to your pharmacy for you to pick up at your convenience: Colestipol. Take one - two at bedtime to see what helps. Mycolog - use twice daily  Try using a hair dryer on low after bowel movements to dry your buttocks. Don't over wipe.  Follow up with Dr Carlean Purl in 2 months.   I appreciate the opportunity to care for you. Silvano Rusk, MD, [facility]

## 2020-02-17 ENCOUNTER — Telehealth: Payer: Self-pay | Admitting: Internal Medicine

## 2020-02-17 NOTE — Telephone Encounter (Signed)
I left Jolyn a message that Dr Carlean Purl is out today and I will call her back tomorrow with an answer.

## 2020-02-22 NOTE — Telephone Encounter (Signed)
Try gas ex

## 2020-02-22 NOTE — Telephone Encounter (Signed)
Mail box is full so I will try again later to reach [place].

## 2020-02-23 NOTE — Telephone Encounter (Signed)
Patient informed and will try the gas ex and let us know if this helps.

## 2020-03-07 ENCOUNTER — Telehealth: Payer: Self-pay | Admitting: Internal Medicine

## 2020-03-07 ENCOUNTER — Other Ambulatory Visit: Payer: Self-pay | Admitting: Internal Medicine

## 2020-03-07 NOTE — Telephone Encounter (Signed)
I have advised the following based upon my discussion with her:  Take a second dose of cholestyramine 4 g at around 10 PM about 4 hours after evening meds.  Eliminate artificial sweeteners  I will refill her Lomotil  Note that she has her diarrheal stools between 10 PM and 5 AM.  She goes to sleep at about 2 AM and is disturbed but after 5 AM does not have bowel movements until about 10 PM again.

## 2020-03-07 NOTE — Telephone Encounter (Signed)
Please advise Sir? Thank you. 

## 2020-03-07 NOTE — Telephone Encounter (Signed)
[name] is taking her colestipol daily at 3pm and she is taking 2 lomotil every 5 hours. Three hours after she eats supper no matter what time she eats supper she has diarrhea for six hours every 15 minutes and then it stops. No fever, no blood in stool, flakes of food in her stool.

## 2020-03-07 NOTE — Telephone Encounter (Signed)
Need more information  How much was she taking and what are sxs?

## 2020-03-15 ENCOUNTER — Other Ambulatory Visit: Payer: Medicare PPO | Admitting: Internal Medicine

## 2020-03-15 ENCOUNTER — Other Ambulatory Visit: Payer: Self-pay

## 2020-03-15 DIAGNOSIS — E7800 Pure hypercholesterolemia, unspecified: Secondary | ICD-10-CM | POA: Diagnosis not present

## 2020-03-15 DIAGNOSIS — Z0000 Encounter for general adult medical examination without abnormal findings: Secondary | ICD-10-CM

## 2020-03-15 DIAGNOSIS — M8580 Other specified disorders of bone density and structure, unspecified site: Secondary | ICD-10-CM | POA: Diagnosis not present

## 2020-03-15 DIAGNOSIS — R7989 Other specified abnormal findings of blood chemistry: Secondary | ICD-10-CM | POA: Diagnosis not present

## 2020-03-15 DIAGNOSIS — K52832 Lymphocytic colitis: Secondary | ICD-10-CM

## 2020-03-15 DIAGNOSIS — R413 Other amnesia: Secondary | ICD-10-CM

## 2020-03-15 DIAGNOSIS — I4819 Other persistent atrial fibrillation: Secondary | ICD-10-CM | POA: Diagnosis not present

## 2020-03-16 ENCOUNTER — Telehealth: Payer: Self-pay | Admitting: Internal Medicine

## 2020-03-16 LAB — COMPLETE METABOLIC PANEL WITH GFR
AG Ratio: 1.5 (calc) (ref 1.0–2.5)
ALT: 13 U/L (ref 6–29)
AST: 19 U/L (ref 10–35)
Albumin: 4 g/dL (ref 3.6–5.1)
Alkaline phosphatase (APISO): 52 U/L (ref 37–153)
BUN: 12 mg/dL (ref 7–25)
CO2: 26 mmol/L (ref 20–32)
Calcium: 9.5 mg/dL (ref 8.6–10.4)
Chloride: 106 mmol/L (ref 98–110)
Creat: 0.72 mg/dL (ref 0.60–0.88)
GFR, Est African American: 91 mL/min/{1.73_m2} (ref >=60)
GFR, Est Non African American: 79 mL/min/{1.73_m2} (ref >=60)
Globulin: 2.7 g/dL (calc) (ref 1.9–3.7)
Glucose, Bld: 88 mg/dL (ref 65–99)
Potassium: 4.6 mmol/L (ref 3.5–5.3)
Sodium: 143 mmol/L (ref 135–146)
Total Bilirubin: 0.4 mg/dL (ref 0.2–1.2)
Total Protein: 6.7 g/dL (ref 6.1–8.1)

## 2020-03-16 LAB — CBC WITH DIFFERENTIAL/PLATELET
Absolute Monocytes: 545 cells/uL (ref 200–950)
Basophils Absolute: 17 cells/uL (ref 0–200)
Basophils Relative: 0.3 %
Eosinophils Absolute: 383 cells/uL (ref 15–500)
Eosinophils Relative: 6.6 %
HCT: 39.4 % (ref 35.0–45.0)
Hemoglobin: 13.5 g/dL (ref 11.7–15.5)
Lymphs Abs: 2001 cells/uL (ref 850–3900)
MCH: 33.8 pg — ABNORMAL HIGH (ref 27.0–33.0)
MCHC: 34.3 g/dL (ref 32.0–36.0)
MCV: 98.5 fL (ref 80.0–100.0)
MPV: 9.6 fL (ref 7.5–12.5)
Monocytes Relative: 9.4 %
Neutro Abs: 2854 cells/uL (ref 1500–7800)
Neutrophils Relative %: 49.2 %
Platelets: 184 10*3/uL (ref 140–400)
RBC: 4 10*6/uL (ref 3.80–5.10)
RDW: 12.1 % (ref 11.0–15.0)
Total Lymphocyte: 34.5 %
WBC: 5.8 10*3/uL (ref 3.8–10.8)

## 2020-03-16 LAB — LIPID PANEL
Cholesterol: 179 mg/dL (ref <200)
HDL: 106 mg/dL (ref >=50)
LDL Cholesterol (Calc): 59 mg/dL (calc)
Non-HDL Cholesterol (Calc): 73 mg/dL (calc) (ref <130)
Total CHOL/HDL Ratio: 1.7 (calc) (ref <5.0)
Triglycerides: 68 mg/dL (ref <150)

## 2020-03-16 LAB — TSH: TSH: 4.1 mIU/L (ref 0.40–4.50)

## 2020-03-16 NOTE — Telephone Encounter (Signed)
[name] said the pharmacist said to her yesterday when she picked up her generic Lomotil that he didn't want her to become "dependant on it" and for her to ask the Dr if she should switch to something else. She is taking the medicine as prescribed -a total of 8 Lomotil a day and also takes the cholestyramine BID. This is working for her. She just thought she should call and get your advise Sir.

## 2020-03-17 ENCOUNTER — Encounter: Payer: Self-pay | Admitting: Internal Medicine

## 2020-03-17 ENCOUNTER — Ambulatory Visit (INDEPENDENT_AMBULATORY_CARE_PROVIDER_SITE_OTHER): Payer: Medicare PPO | Admitting: Internal Medicine

## 2020-03-17 VITALS — BP 110/70 | HR 91 | Ht 63.5 in | Wt 117.0 lb

## 2020-03-17 DIAGNOSIS — Z7901 Long term (current) use of anticoagulants: Secondary | ICD-10-CM

## 2020-03-17 DIAGNOSIS — M8580 Other specified disorders of bone density and structure, unspecified site: Secondary | ICD-10-CM

## 2020-03-17 DIAGNOSIS — Z0000 Encounter for general adult medical examination without abnormal findings: Secondary | ICD-10-CM | POA: Diagnosis not present

## 2020-03-17 DIAGNOSIS — K52832 Lymphocytic colitis: Secondary | ICD-10-CM | POA: Diagnosis not present

## 2020-03-17 DIAGNOSIS — I4819 Other persistent atrial fibrillation: Secondary | ICD-10-CM | POA: Diagnosis not present

## 2020-03-17 DIAGNOSIS — E7800 Pure hypercholesterolemia, unspecified: Secondary | ICD-10-CM | POA: Diagnosis not present

## 2020-03-17 DIAGNOSIS — H903 Sensorineural hearing loss, bilateral: Secondary | ICD-10-CM

## 2020-03-17 DIAGNOSIS — R7989 Other specified abnormal findings of blood chemistry: Secondary | ICD-10-CM

## 2020-03-17 LAB — POCT URINALYSIS DIPSTICK
Appearance: NEGATIVE
Bilirubin, UA: NEGATIVE
Blood, UA: NEGATIVE
Glucose, UA: NEGATIVE
Ketones, UA: NEGATIVE
Leukocytes, UA: NEGATIVE
Nitrite, UA: NEGATIVE
Odor: NEGATIVE
Protein, UA: NEGATIVE
Spec Grav, UA: 1.01 (ref 1.010–1.025)
Urobilinogen, UA: 0.2 E.U./dL
pH, UA: 6.5 (ref 5.0–8.0)

## 2020-03-17 NOTE — Telephone Encounter (Signed)
Left Eduardo a message on her home # to call me back. Tried her cell # but voicemail is full and not accepting messages.

## 2020-03-17 NOTE — Telephone Encounter (Signed)
Patient informed and will give it a try.

## 2020-03-17 NOTE — Telephone Encounter (Signed)
I would prefer she take less than 8 a day on an ongoing basis if we can make that happen.  Ask her to take 1 instead of 2 and see if 4 a day will work and we will discuss more at f/u or sooner prn

## 2020-03-17 NOTE — Progress Notes (Addendum)
Subjective:    Patient ID: [name], female    DOB: 09/29/1940, 79 y.o.   MRN: [id number]  HPI  79 year old Female seen for Medicare wellness, health maintenance exam and evaluation of medical issues.  History of rheumatoid arthritis treated with Remicade by Dr. Amil Amen.  History of high HDL cholesterol.  However LDL has crept up over the past few years and suggested 5 mg 3 times a week.  History of irritable bowel syndrome with both constipation and diarrhea.  Diagnosed with lymphocytic colitis by Dr. Carlean Purl.  History of GE reflux.  Has tried Lactaid so she can tolerate milk products.  In 2019 she went to pelvic floor physical therapy and had improvement with fecal leakage soiling and incontinence.  History of hearing loss and tinnitus.  Diagnosed with new onset atrial fib in November 2020.  Subsequently had cardioversion in January 2021.  She is on chronic anticoagulation.  After cardioversion she was found to be back in atrial fibrillation on follow-up cardiology appointment in March 2021.  Rate was controlled at 84.  Was referred to A. fib clinic.  Echocardiogram from December 2020 indicated she had left atrial size severely dilated and right atrial size mildly dilated.  CHADS2 score was 3.  Patient and husband decided they would pursue rate control and continue Eliquis.  In January 2015 she had surgery by Dr. 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  Remote history of plantar fasciitis.  Social history: She is married to Shelly Flatten, well-known to Probation officer and poet.  She has a Education officer, community.  1 adult son who lives in [place].  Social alcohol consumption.  Does not smoke.  Family history: Father died at age 18 of complications of COPD.  Mother died at age 49 of heart failure.   No brothers or sisters.  Had bone density study June 2012 which was normal.  Bone density study in 2014 showed T score of -1.1.  This is consistent with mild osteopenia.  This was done at [facility].  She exercises regularly.  No hospitalizations other than childbirth.  History of environmental allergies.    Review of Systems main complaint is diarrhea from lymphocytic colitis/irritable bowel syndrome.  It is inconvenient and unpredictable.     Objective:   Physical Exam Blood pressure 110/70 pulse 91 and regular pulse oximetry 97% weight 117 pounds height 5 feet 3.5 inches BMI 20.40  Skin is warm and dry.  No adenopathy appreciated.  Neck is supple without JVD thyromegaly or carotid bruits.  Chest is clear to auscultation without rales or wheezing.  Cardiac exam: Irregular irregular rhythm controlled.  No murmur.  Abdomen: Soft nondistended without hepatosplenomegaly masses or tenderness.  Bimanual exam is normal.  Pap deferred due to age.  Neuro intact without focal deficits.  No lower extremity edema.  Neuro: Intact without gross focal deficits.         Assessment & Plan:  Atrial fibrillation diagnosed as new onset November 2020.  Had 1 cardioversion and went back into atrial fib and now is taking Eliquis with rate control medication.  Lymphocytic colitis/irritable bowel syndrome treated by Dr. Carlean Purl  High HDL cholesterol  GE reflux treated with Nexium by Dr. 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  Subjective:   Patient presents for Medicare Annual/Subsequent preventive examination.  Review Past Medical/Family/Social: See above   Risk Factors  Current exercise habits: Tries to exercise regularly Dietary issues discussed: Low-fat low carbohydrate-has  issues with colitis  Cardiac risk factors: Elevated LDL, mother died of complications of heart failure, atrial fibrillation  Depression Screen  (Note: if answer to either of the following is "Yes", a more complete depression screening is indicated)   Over the past two weeks, have you felt down, depressed or hopeless? Yes with the pandemic Over the past two weeks, have you felt little interest or pleasure in doing things? No Have you lost interest or pleasure in daily life? No Do you often feel hopeless? No Do you cry easily over simple problems? sometimes  Activities of Daily Living  In your present state of health, do you have any difficulty performing the following activities?:   Driving? No  Managing money? No  Feeding yourself? No  Getting from bed to chair? No  Climbing a flight of stairs? No  Preparing food and eating?: No  Bathing or showering? No  Getting dressed: No  Getting to the toilet? No  Using the toilet:No  Moving around from place to place: No  In the past year have you fallen or had a near fall?:yes Are you sexually active? yes Do you have more than one partner? No   Hearing Difficulties: yes Do you often ask people to speak up or repeat themselves?  Yes Do you experience ringing or noises in your ears?  Yes Do you have difficulty understanding soft or whispered voices?  Yes -history of hearing loss Do you feel that you have a problem with memory? No Do you often misplace items?  Occasionally   Home Safety:  Do you have a smoke alarm at your residence? Yes Do you have grab bars in the bathroom?yes Do you have throw rugs in your house? yes   Cognitive Testing  Alert? Yes Normal Appearance?Yes  Oriented to person? Yes Place? Yes  Time? Yes  Recall of three objects? Yes  Can perform simple calculations? Yes  Displays appropriate judgment?Yes  Can read the correct time from a watch face?Yes   List the Names of Other Physician/Practitioners you  currently use:  See referral list for the physicians patient is currently seeing.  Dr. Carlean Purl  [facility]   Review of Systems:see above   Objective:     General appearance: Appears stated age and thin Head: Normocephalic, without obvious abnormality, atraumatic  Eyes: conj clear, EOMi PEERLA  Ears: normal TM's and external ear canals both ears  Nose: Nares normal. Septum midline. Mucosa normal. No drainage or sinus tenderness.  Throat: lips, mucosa, and tongue normal; teeth and gums normal  Neck: no adenopathy, no carotid bruit, no JVD, supple, symmetrical, trachea midline and thyroid not enlarged, symmetric, no tenderness/mass/nodules  No CVA tenderness.  Lungs: clear to auscultation bilaterally  Breasts: normal appearance, no masses or tenderness Heart: Irreg rhythm S1, S2 normal, no murmur, click, rub or gallop  Abdomen: soft, non-tender; bowel sounds normal; no masses, no organomegaly  Musculoskeletal: ROM normal in all joints, no crepitus, no deformity, Normal muscle strengthen. Back  is symmetric, no curvature. Skin: Skin color, texture, turgor normal. No rashes or lesions  Lymph nodes: Cervical, supraclavicular, and  axillary nodes normal.  Neurologic: CN 2 -12 Normal, Normal symmetric reflexes. Normal coordination and gait  Psych: Alert & Oriented x 3, Mood appear stable.    Assessment:    Annual wellness medicare exam   Plan:    During the course of the visit the patient was educated and counseled about appropriate screening and preventive services including:  Had mammogram in January 2021  Has had 3 Covid immunizations  Pneumococcal immunizations are up-to-date  Had Tdap in 2012  Flu vaccine in September 2021    Patient Instructions (the written plan) was given to the patient.  Medicare Attestation  I have personally reviewed:  The patient's medical and social history  Their use of alcohol, tobacco or illicit drugs  Their current medications and  supplements  The patient's functional ability including ADLs,fall risks, home safety risks, cognitive, and hearing and visual impairment  Diet and physical activities  Evidence for depression or mood disorders  The patient's weight, height, BMI, and visual acuity have been recorded in the chart. I have made referrals, counseling, and provided education to the patient based on review of the above and I have provided the patient with a written personalized care plan for preventive services.

## 2020-03-18 ENCOUNTER — Other Ambulatory Visit: Payer: Self-pay | Admitting: Cardiology

## 2020-03-18 NOTE — Telephone Encounter (Signed)
[id number] Scr 0.57 03/15/20 Lovw/schumann 11/03/19

## 2020-03-22 DIAGNOSIS — M0609 Rheumatoid arthritis without rheumatoid factor, multiple sites: Secondary | ICD-10-CM | POA: Diagnosis not present

## 2020-04-04 ENCOUNTER — Ambulatory Visit: Payer: Medicare PPO | Admitting: Internal Medicine

## 2020-04-06 ENCOUNTER — Other Ambulatory Visit: Payer: Self-pay

## 2020-04-06 ENCOUNTER — Ambulatory Visit: Payer: Medicare PPO | Admitting: Podiatry

## 2020-04-06 ENCOUNTER — Encounter: Payer: Self-pay | Admitting: Podiatry

## 2020-04-06 ENCOUNTER — Telehealth: Payer: Self-pay

## 2020-04-06 DIAGNOSIS — I8393 Asymptomatic varicose veins of bilateral lower extremities: Secondary | ICD-10-CM

## 2020-04-06 DIAGNOSIS — B351 Tinea unguium: Secondary | ICD-10-CM | POA: Diagnosis not present

## 2020-04-06 DIAGNOSIS — M79674 Pain in right toe(s): Secondary | ICD-10-CM

## 2020-04-06 DIAGNOSIS — M79675 Pain in left toe(s): Secondary | ICD-10-CM

## 2020-04-06 NOTE — Telephone Encounter (Signed)
Left detailed message.   

## 2020-04-06 NOTE — Telephone Encounter (Signed)
Just stay off of it and not add anything else

## 2020-04-06 NOTE — Telephone Encounter (Signed)
Patient called she has been off of the rosuvastatin for two weeks and the diarrhea went away, she said is there anything else she can take?

## 2020-04-11 NOTE — Progress Notes (Signed)
Subjective: [name] is a pleasant 79 y.o. female patient seen today painful thick toenails that are difficult to trim. Pain interferes with ambulation. Aggravating factors include wearing enclosed shoe gear. Pain is relieved with periodic professional debridement.  She has questions regarding discoloration around her ankles.   Past Medical History:  Diagnosis Date  . Allergy    seasonal  . Anemia   . Cataract    bil cateracts removed  . Chronic constipation   . Diarrhea    chronic diarrheafor over 2 months  . GERD (gastroesophageal reflux disease)   . Hyperlipidemia   . 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 Rheumatoid arthritis ([place])   . Status post dilation of esophageal narrowing 2014  . Wears glasses     Patient Active Problem List   Diagnosis Date Noted  . Secondary hypercoagulable state ([place]) 07/07/2019  . Persistent atrial fibrillation ([place])   . Nail complaint 10/22/2017  . Hammertoe of right foot 10/22/2017  . 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 10/16/2017  . 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 Dysuria 09/05/2017  . 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 Right hip pain 02/24/2016  . Rheumatoid arthritis ([place]) 06/22/2015  . Knee pain, right 06/22/2015  . Degenerative tear of medial meniscus of left knee 11/25/2014  . Right wrist pain 04/02/2013  . Double vision 08/26/2012  . H/O long-term treatment with high-risk medication 11/08/2011  . Achilles tendon disorder 10/30/2011  . Osteoarthritis of left ankle 09/21/2011  . Tinnitus 09/11/2011  . Hip pain, left 12/14/2010  . ALLERGIC RHINITIS, CHRONIC 11/23/2008  . CONSTIPATION, CHRONIC 11/23/2008  . ACNE, ROSACEA 11/23/2008  . At risk for osteopenia 11/11/2007  . Metatarsalgia of both feet 08/27/2006  . PLANTAR FACIITIS 08/27/2006  . FOOT PAIN, BILATERAL 08/27/2006  . HYPERLIPIDEMIA 06/27/2006  . GASTROESOPHAGEAL REFLUX, NO ESOPHAGITIS 06/27/2006   . OSTEOARTHRITIS, MULTI SITES 06/27/2006    Current Outpatient Medications on File Prior to Visit  Medication Sig Dispense Refill  . acetaminophen (TYLENOL) 500 MG tablet Take 500-1,000 mg by mouth every 8 (eight) hours as needed for moderate pain or headache.     . calcium carbonate (TUMS EX) 750 MG chewable tablet Chew 1,500 mg by mouth 2 (two) times daily.     . Cholecalciferol (DIALYVITE VITAMIN D 5000) 125 MCG (5000 UT) capsule Take 5,000 Units by mouth daily.    . cholestyramine (QUESTRAN) 4 g packet Take 1 packet (4 g total) by mouth 2 (two) times daily. 60 each   . Clobetasol Propionate 0.05 % shampoo     . diphenoxylate-atropine (LOMOTIL) 2.5-0.025 MG tablet Take 2 tablets by mouth 4 (four) times daily as needed for diarrhea or loose stools. Maximum 8 tablets daily 90 tablet 2  . doxycycline (VIBRAMYCIN) 50 MG capsule Take 50 mg by mouth every Monday, Wednesday, and Friday.    Marland Kitchen ELIQUIS 2.5 MG TABS tablet TAKE 1 TABLET BY MOUTH TWICE A DAY 180 tablet 1  . esomeprazole (NEXIUM) 40 MG capsule TAKE 1 CAPSULE(40 MG) BY MOUTH DAILY BEFORE A MEAL 90 capsule 3  . finasteride (PROSCAR) 5 MG tablet     . hydrocortisone cream 1 % Apply 1 application topically daily as needed for itching (burns).    . InFLIXimab (REMICADE IV) Inject 1 Dose into the vein See admin instructions. Remicade Infusion every 2 months    . Lactase (LACTAID PO) Take by mouth.    . metoprolol tartrate (LOPRESSOR) 25 MG tablet Take  1 tablet in the AM and 1.5 tablet in the PM 270 tablet 3  . minoxidil (ROGAINE) 2 % external solution Apply 1 application topically 2 (two) times daily.    . Multiple Vitamins-Minerals (PRESERVISION AREDS 2) CAPS Take 1 capsule by mouth 2 (two) times daily.    Marland Kitchen neomycin-bacitracin-polymyxin (NEOSPORIN) ointment Apply 1 application topically as needed for wound care.    . NON FORMULARY Gluten Buster    . nystatin-triamcinolone ointment (MYCOLOG) Apply 1 application topically 2 (two) times  daily. 30 g 1  . Polyethyl Glycol-Propyl Glycol (SYSTANE) 0.4-0.3 % GEL ophthalmic gel Place 1 application into both eyes at bedtime.    . rosuvastatin (CRESTOR) 5 MG tablet One po 3 times a week 36 tablet 3  . SOOLANTRA 1 % CREA Apply 1 application topically daily.   1   No current facility-administered medications on file prior to visit.    Allergies  Allergen Reactions  . Cox-2 Inhibitors Nausea And Vomiting    Severe abd pain  . Glucose Diarrhea    cramps  . Lactose Intolerance (Gi) Diarrhea    Stomach cramps    Objective: Physical Exam  General: [name] is a pleasant 79 y.o. Caucasian female, WD, WN in NAD. AAO x 3.   Vascular:  Capillary refill time to digits immediate b/l. Palpable pedal pulses b/l LE. Pedal hair present b/l lower extremities. Lower extremity skin temperature gradient within normal limits. No pain with calf compression b/l. No edema noted b/l lower extremities. She has spider veins noted medial aspect of both ankles.  Dermatological:  Pedal skin with normal turgor, texture and tone bilaterally. No open wounds bilaterally. No interdigital macerations bilaterally. Toenails 1-5 b/l elongated, discolored, dystrophic, thickened, crumbly with subungual debris and tenderness to dorsal palpation. No hyperkeratotic nor porokeratotic lesions present on today's visit.  Musculoskeletal:  Normal muscle strength 5/5 to all lower extremity muscle groups bilaterally. No pain crepitus or joint limitation noted with ROM b/l. No gross bony deformities bilaterally. Patient ambulates independent of any assistive aids.  Neurological:  Protective sensation intact 5/5 intact bilaterally with 10g monofilament b/l. Vibratory sensation intact b/l. Clonus negative b/l.  Assessment and Plan:  1. Pain due to onychomycosis of toenails of both feet   2. Spider veins of both lower extremities     -Examined patient. -No new findings. No new orders. -Discussed spider veins.   -Toenails 1-5 b/l were debrided in length and girth with sterile nail nippers and dremel without iatrogenic bleeding.  -Patient to report any pedal injuries to medical professional immediately. -Patient to continue soft, supportive shoe gear daily. -Patient/POA to call should there be question/concern in the interim.  Return in about 3 months (around 07/05/2020).  Marzetta Board, DPM

## 2020-04-30 ENCOUNTER — Other Ambulatory Visit: Payer: Self-pay | Admitting: Internal Medicine

## 2020-05-01 DIAGNOSIS — H905 Unspecified sensorineural hearing loss: Secondary | ICD-10-CM | POA: Insufficient documentation

## 2020-05-01 NOTE — Patient Instructions (Addendum)
It was a pleasure to see you today.  Follow-up in 1 year or as needed.  Note: Crestor is not tolerated due to diarrhea apparently.

## 2020-05-02 NOTE — Progress Notes (Signed)
Cardiology Office Note:    Date:  05/06/2020   ID:  [name], DOB 10/29/1940, MRN [id number]  PCP:  [name], MD  Cardiologist:  No primary care provider on file.  Electrophysiologist:  None   Referring MD: [name], MD   Chief Complaint  Patient presents with  . Atrial Fibrillation    History of Present Illness:    [name] is a 80 y.o. female with a hx of  hyperlipidemia, rheumatoid arthritis, lymphocytic colitis, GERD who presents for follow-up.  She was initially seen on 04/03/2019, had been referred by Dr. Renold Genta for evaluation of new onset atrial fibrillation.  At initial clinic visit, she was noted to be in AF with rates 110s, was started on metoprolol 25 mg twice daily for rate control.  She was started on Eliquis 2.5 mg twice daily for anticoagulation.  TTE on 04/30/2019 showed EF 60 to 65%, normal RV function, severe left atrial dilatation, mild right atrial dilatation, mild MR, mild AI.  On 05/11/2019, she underwent a successful DCCV to restore sinus rhythm.  Following cardioversion, she reported that her heart rate would fall to the 40s, metoprolol dose was decreased to 12.5 mg twice daily.  At follow-up clinic on 07/03/2019, she was noted to be back in atrial fibrillation.  She was referred to the A. fib clinic, and rhythm control options were discussed but patient elected to continue with rate control strategy.  Zio patch x3 days on 12/02/2019 showed 100s in AF burden with average rate 78 bpm.  Since her last clinic visit, she reports that she has been doing well.  Denies any chest pain, dyspnea, lightheadedness, syncope, lower extremity edema, or palpitations.  Works out about twice per week.  Taking Eliquis, denies any bleeding issues.   Past Medical History:  Diagnosis Date  . Allergy    seasonal  . Anemia   . Cataract    bil cateracts removed  . Chronic constipation   . Diarrhea    chronic diarrheafor over 2 months  . GERD (gastroesophageal  reflux disease)   . Hyperlipidemia   . 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 Rheumatoid arthritis ([place])   . Status post dilation of esophageal narrowing 2014  . Wears glasses     Past Surgical History:  Procedure Laterality Date  . CARDIOVERSION N/A 05/11/2019   Procedure: CARDIOVERSION;  Surgeon: Buford Dresser, MD;  Location: [facility];  Service: Cardiovascular;  Laterality: N/A;  . CATARACT EXTRACTION  06/30/2016 -right eye   left eye in 08/15/16  . COLONOSCOPY    . DILATION AND CURETTAGE OF UTERUS  2009  . ESOPHAGEAL DILATION  05/31/1997  . REPAIR EXTENSOR TENDON Right 05/14/2013   Procedure: LONG FINGER EXTENSOR TO ELEVATE RING AND SMALL FINGERS ;  Surgeon: Cammie Sickle., MD;  Location: [place];  Service: Orthopedics;  Laterality: Right;  . seborrheic keratosis  09/03/12   inflamed, excised from forehead  . TENDON TRANSFER Right 09/03/2013   Procedure: RIGHT HAND FLEXOR CARPI RADIALUS TRANSFER TO LONG/RING/SMALL;  Surgeon: Cammie Sickle., MD;  Location: [place];  Service: Orthopedics;  Laterality: Right;  . TONSILLECTOMY    . TUBAL LIGATION    . ULNAR HEAD EXCISION Right 05/14/2013   Procedure: ARTHOPLASTY DISTAL RADIAL ULNAR JOINT, RIGHT ;  Surgeon: Cammie Sickle., MD;  Location: [place];  Service: Orthopedics;  Laterality: Right;  . UPPER GASTROINTESTINAL ENDOSCOPY  Current Medications: Current Meds  Medication Sig  . acetaminophen (TYLENOL) 500 MG tablet Take 500-1,000 mg by mouth every 8 (eight) hours as needed for moderate pain or headache.   . calcium carbonate (TUMS EX) 750 MG chewable tablet Chew 1,500 mg by mouth 2 (two) times daily.   . Cholecalciferol (DIALYVITE VITAMIN D 5000) 125 MCG (5000 UT) capsule Take 5,000 Units by mouth daily.  . cholestyramine (QUESTRAN) 4 g packet Take 1 packet (4 g total) by mouth 2 (two) times daily.  . diphenoxylate-atropine (LOMOTIL)  2.5-0.025 MG tablet Take 2 tablets by mouth 4 (four) times daily as needed for diarrhea or loose stools. Maximum 8 tablets daily  . doxycycline (VIBRAMYCIN) 50 MG capsule Take 50 mg by mouth every Monday, Wednesday, and Friday.  Marland Kitchen ELIQUIS 2.5 MG TABS tablet TAKE 1 TABLET BY MOUTH TWICE A DAY  . esomeprazole (NEXIUM) 40 MG capsule TAKE 1 CAPSULE(40 MG) BY MOUTH DAILY BEFORE A MEAL  . finasteride (PROSCAR) 5 MG tablet   . hydrocortisone cream 1 % Apply 1 application topically daily as needed for itching (burns).  . InFLIXimab (REMICADE IV) Inject 1 Dose into the vein See admin instructions. Remicade Infusion every 2 months  . Lactase (LACTAID PO) Take by mouth.  . minoxidil (ROGAINE) 2 % external solution Apply 1 application topically 2 (two) times daily.  . Multiple Vitamins-Minerals (PRESERVISION AREDS 2) CAPS Take 1 capsule by mouth 2 (two) times daily.  Marland Kitchen neomycin-bacitracin-polymyxin (NEOSPORIN) ointment Apply 1 application topically as needed for wound care.  . nystatin-triamcinolone ointment (MYCOLOG) Apply 1 application topically 2 (two) times daily.  Vladimir Faster Glycol-Propyl Glycol (SYSTANE) 0.4-0.3 % GEL ophthalmic gel Place 1 application into both eyes at bedtime.  . rosuvastatin (CRESTOR) 5 MG tablet One po 3 times a week  . SOOLANTRA 1 % CREA Apply 1 application topically daily.   . [DISCONTINUED] metoprolol tartrate (LOPRESSOR) 25 MG tablet Take 1 tablet in the AM and 1.5 tablet in the PM     Allergies:   Cox-2 inhibitors, Glucose, Lactose intolerance (gi), and Rosuvastatin   Social History   Socioeconomic History  . Marital status: Married    Spouse name: [name]  . Number of children: 1  . Years of education: 56  . Highest education level: Not on file  Occupational History  . Occupation: Retired- Scientist, research (physical sciences): RETIRED  Tobacco Use  . Smoking status: Never Smoker  . Smokeless tobacco: Never Used  Vaping Use  . Vaping Use: Never used  Substance and Sexual  Activity  . Alcohol use: Yes    Alcohol/week: 21.0 standard drinks    Types: 21 Standard drinks or equivalent per week    Comment: 3 times weekly  . Drug use: No  . Sexual activity: Not on file  Other Topics Concern  . Not on file  Social History Narrative   Health Care POA: Husband, Fred   Emergency Contact: Wilfrid Lund of Life Plan:    Who lives with you: husband   Son, in [place]   Any pets: 3 cats   Diet: Pt has a varied diet of protein, starch, and vegetables.   Exercise: Pt works with trainer 2x a week for 60 minutes   Seatbelts: Pt reports wearing seatbelt when in vehicle.   Sun Exposure/Protection: Pt reports not using sun protection.   Hobbies: gardening, reading, concerts               Social Determinants of Health  Financial Resource Strain: Not on file  Food Insecurity: Not on file  Transportation Needs: Not on file  Physical Activity: Not on file  Stress: Not on file  Social Connections: Not on file     Family History: The patient's family history includes COPD in her father; Colon cancer in her paternal uncle; Heart disease in her mother; Osteoporosis in her maternal aunt, maternal grandmother, and mother. There is no history of Esophageal cancer, Pancreatic cancer, Rectal cancer, or Stomach cancer.  ROS:   Please see the history of present illness.    A whole 8 I get asked the minimum of once a day bowel dialysis I assessment all other systems reviewed and are negative.  EKGs/Labs/Other Studies Reviewed:    The following studies were reviewed today:   EKG:  EKG is  ordered today.  The ekg ordered today demonstrates atrial fibrillation, rate 58  Recent Labs: 03/15/2020: ALT 13; BUN 12; Creat 0.72; Hemoglobin 13.5; Platelets 184; Potassium 4.6; Sodium 143; TSH 4.10  Recent Lipid Panel    Component Value Date/Time   CHOL 179 03/15/2020 1107   TRIG 68 03/15/2020 1107   HDL 106 03/15/2020 1107   CHOLHDL 1.7 03/15/2020 1107   VLDL 19 08/03/2016 1001    LDLCALC 59 03/15/2020 1107   LDLDIRECT 149 (H) 11/11/2007 2043   TTE 04/30/19: 1. Left ventricular ejection fraction, by visual estimation, is 60 to  65%. The left ventricle has normal function. Left ventricular septal wall  thickness was normal. Normal left ventricular posterior wall thickness.  There is no left ventricular  hypertrophy.  2. Left ventricular diastolic parameters are indeterminate.  3. The left ventricle has no regional wall motion abnormalities.  4. Global right ventricle has normal systolic function.The right  ventricular size is normal. No increase in right ventricular wall  thickness.  5. Left atrial size was severely dilated.  6. Right atrial size was mildly dilated.  7. The mitral valve is normal in structure. Mild mitral valve  regurgitation. No evidence of mitral stenosis.  8. The tricuspid valve is normal in structure.  9. The aortic valve is tricuspid. Aortic valve regurgitation is mild. No  evidence of aortic valve sclerosis or stenosis.  10. The pulmonic valve was normal in structure. Pulmonic valve  regurgitation is not visualized.  11. Normal pulmonary artery systolic pressure.  12. The inferior vena cava is dilated in size with >50% respiratory  variability, suggesting right atrial pressure of 8 mmHg.   Physical Exam:    VS:  BP 120/70   Pulse (!) 58   Ht 5' 4.5" (1.638 m)   Wt 117 lb 3.2 oz (53.2 kg)   BMI 19.81 kg/m     Wt Readings from Last 3 Encounters:  05/06/20 117 lb 3.2 oz (53.2 kg)  03/17/20 117 lb (53.1 kg)  02/04/20 115 lb (52.2 kg)     GEN: in no acute distress HEENT: Normal NECK: No JVD CARDIAC: irregular,  no murmurs, rubs, gallops RESPIRATORY:  Clear to auscultation without rales, wheezing or rhonchi  ABDOMEN: Soft, non-tender, non-distended MUSCULOSKELETAL:  No edema; No deformity  SKIN: Warm and dry NEUROLOGIC:  Alert and oriented x 3 PSYCHIATRIC:  Normal affect   ASSESSMENT:    1. Chronic atrial  fibrillation (HCC)    PLAN:     Atrial fibrillation: CHADS-VASc score 3 (agex2, female).  Appears rate controlled on metoprolol.  Successful DCCV on 05/11/2019, however was back in atrial fibrillation at follow-up appointment on 07/03/2019.  TTE  shows normal LV systolic function, severe left atrial dilatation.  She was seen in AF clinic and decided to pursue rate control strategy as rates well controlled and appears asymptomatic.  Zio patch x3 days on 12/02/2019 showed 100s in AF burden with average rate 78 bpm. -Continue Eliquis.  Given age 18 and weight <60kg, meets indication for reduced dose.  Will continue 2.5 mg twice daily -Continue metoprolol, will reduce dose to 25 mg twice daily given slow rates  RTC in 6 months   Medication Adjustments/Labs and Tests Ordered: Current medicines are reviewed at length with the patient today.  Concerns regarding medicines are outlined above.  Orders Placed This Encounter  Procedures  . EKG 12-Lead   Meds ordered this encounter  Medications  . metoprolol tartrate (LOPRESSOR) 25 MG tablet    Sig: Take 1 tablet (25 mg total) by mouth 2 (two) times daily.    Dispense:  180 tablet    Refill:  3    Dose increase will notify when needed    Patient Instructions  Medication Instructions:  DECREASE metoprolol tartrate (Lopressor) to 25 mg two times daily  *If you need a refill on your cardiac medications before your next appointment, please call your pharmacy*  Follow-Up: At [facility], you and your health needs are our priority.  As part of our continuing mission to provide you with exceptional heart care, we have created designated Provider Care Teams.  These Care Teams include your primary Cardiologist (physician) and Advanced Practice Providers (APPs -  Physician Assistants and Nurse Practitioners) who all work together to provide you with the care you need, when you need it.  We recommend signing up for the patient portal called "MyChart".  Sign  up information is provided on this After Visit Summary.  MyChart is used to connect with patients for Virtual Visits (Telemedicine).  Patients are able to view lab/test results, encounter notes, upcoming appointments, etc.  Non-urgent messages can be sent to your provider as well.   To learn more about what you can do with MyChart, go to [email or web address].    Your next appointment:   6 month(s)  The format for your next appointment:   In Person  Provider:   Oswaldo Milian, MD       Signed, Donato Heinz, MD  05/06/2020 1:17 PM    [place]

## 2020-05-06 ENCOUNTER — Encounter: Payer: Self-pay | Admitting: Cardiology

## 2020-05-06 ENCOUNTER — Ambulatory Visit: Payer: Medicare PPO | Admitting: Cardiology

## 2020-05-06 ENCOUNTER — Other Ambulatory Visit: Payer: Self-pay

## 2020-05-06 VITALS — BP 120/70 | HR 58 | Ht 64.5 in | Wt 117.2 lb

## 2020-05-06 DIAGNOSIS — I482 Chronic atrial fibrillation, unspecified: Secondary | ICD-10-CM

## 2020-05-06 MED ORDER — METOPROLOL TARTRATE 25 MG PO TABS: 25.0000 mg | ORAL_TABLET | Freq: Two times a day (BID) | ORAL | 3 refills | Status: DC

## 2020-05-06 NOTE — Patient Instructions (Signed)
Medication Instructions:  DECREASE metoprolol tartrate (Lopressor) to 25 mg two times daily  *If you need a refill on your cardiac medications before your next appointment, please call your pharmacy*  Follow-Up: At [facility], you and your health needs are our priority.  As part of our continuing mission to provide you with exceptional heart care, we have created designated Provider Care Teams.  These Care Teams include your primary Cardiologist (physician) and Advanced Practice Providers (APPs -  Physician Assistants and Nurse Practitioners) who all work together to provide you with the care you need, when you need it.  We recommend signing up for the patient portal called "MyChart".  Sign up information is provided on this After Visit Summary.  MyChart is used to connect with patients for Virtual Visits (Telemedicine).  Patients are able to view lab/test results, encounter notes, upcoming appointments, etc.  Non-urgent messages can be sent to your provider as well.   To learn more about what you can do with MyChart, go to [email or web address].    Your next appointment:   6 month(s)  The format for your next appointment:   In Person  Provider:   Oswaldo Milian, MD

## 2020-05-12 ENCOUNTER — Other Ambulatory Visit: Payer: Self-pay | Admitting: Internal Medicine

## 2020-05-17 ENCOUNTER — Ambulatory Visit: Payer: Medicare PPO | Admitting: Internal Medicine

## 2020-05-17 ENCOUNTER — Encounter: Payer: Self-pay | Admitting: Gastroenterology

## 2020-05-19 DIAGNOSIS — M0609 Rheumatoid arthritis without rheumatoid factor, multiple sites: Secondary | ICD-10-CM | POA: Diagnosis not present

## 2020-05-24 ENCOUNTER — Telehealth: Payer: Self-pay | Admitting: Internal Medicine

## 2020-05-24 NOTE — Telephone Encounter (Signed)
Called and let [name] know what Dr Renold Genta said, she verbalized understanding, very appreciative of quick turnaround.

## 2020-05-24 NOTE — Telephone Encounter (Addendum)
[name] [phone]  [name] called to say she fell on her fanny on the ice a week ago , she had put ice on it and it helped after about 3 days but yesterday she sit down hard on a hard surface and now it is hurting again.  I suspect this is musculoskeletal pain since she can ambulate. Doubt she broke anything. It may take a couple of weeks to get better. Would try heat on the area 20 minutes twice a day. Tylenol is ok. May want to see physical therapist if not improving. We can see her next week if not improving.

## 2020-05-31 ENCOUNTER — Encounter: Payer: Self-pay | Admitting: Internal Medicine

## 2020-05-31 ENCOUNTER — Ambulatory Visit: Payer: Medicare PPO | Admitting: Internal Medicine

## 2020-05-31 ENCOUNTER — Other Ambulatory Visit: Payer: Self-pay

## 2020-05-31 VITALS — BP 100/60 | HR 66 | Temp 98.6°F | Ht 64.5 in | Wt 116.0 lb

## 2020-05-31 DIAGNOSIS — W19XXXA Unspecified fall, initial encounter: Secondary | ICD-10-CM

## 2020-05-31 DIAGNOSIS — M7918 Myalgia, other site: Secondary | ICD-10-CM

## 2020-05-31 NOTE — Telephone Encounter (Signed)
We can see her today or Thursday.

## 2020-05-31 NOTE — Patient Instructions (Signed)
Have LS spine films today with further instructions to follow-up.

## 2020-05-31 NOTE — Telephone Encounter (Signed)
[name] called to say she is not much better, her hips are still hurting and she is putting CBD on her fanny. She is also taking a lot of Tylenol.

## 2020-05-31 NOTE — Telephone Encounter (Signed)
scheduled

## 2020-05-31 NOTE — Progress Notes (Signed)
   Subjective:    Patient ID: [name], female    DOB: [birth year], 80 y.o.   MRN: [id number]  HPI 80 year old Female fell on the ice in a parking lot at a local hotel during snow and icy weather around January 17th. She tried heat on the buttock area but it seemed to make things worse. She was using a hot water bottle instead of a heating pad. Has been taking Tylenol for pain. Ambulate here in the office slowly but OK. Does not need cane to ambulate. No radiculopathy symptoms. Mainly just sore in the buttocks. She did not strike her head.  Has IBS and microscopic colitis.  Review of Systems see above- no radiculopathy     Objective:   Physical Exam BP 100/60 pulse 66 T. 98.6 pulse ox 97% Weight 116 pounds  She does not have LS spine pain- has soreness in buttocks. No LE weakness - muscle strength is normal. Gait is normal.     Assessment & Plan:  Buttock pain secondary to fall on ice  Consider possible compression fracture of the LS spine  Plan: She will have LS spine films later today. Continue with Tylenol. Does not want to try Aleve or Advil with history of colitis and IBS. She may need physical therapy.

## 2020-06-01 ENCOUNTER — Ambulatory Visit
Admission: RE | Admit: 2020-06-01 | Discharge: 2020-06-01 | Disposition: A | Discharge status: Home / Self Care | Payer: Medicare PPO | Source: Ambulatory Visit | Attending: Internal Medicine | Admitting: Internal Medicine

## 2020-06-01 DIAGNOSIS — M545 Low back pain, unspecified: Secondary | ICD-10-CM | POA: Diagnosis not present

## 2020-06-01 DIAGNOSIS — W19XXXA Unspecified fall, initial encounter: Secondary | ICD-10-CM

## 2020-06-16 ENCOUNTER — Other Ambulatory Visit: Payer: Self-pay | Admitting: Internal Medicine

## 2020-06-22 ENCOUNTER — Telehealth: Payer: Self-pay | Admitting: Internal Medicine

## 2020-06-22 NOTE — Telephone Encounter (Signed)
Pt is requesting a call back from a nurse to discuss her medication list.

## 2020-06-22 NOTE — Telephone Encounter (Signed)
[name] said someone called her from here Friday and left a message to call us to discuss her medicine. I don't see a message about this in the system and I was off Friday so I know I didn't reach out to her. She has an appointment up coming 07/05/20 to see Dr Carlean Purl.

## 2020-06-28 DIAGNOSIS — M150 Primary generalized (osteo)arthritis: Secondary | ICD-10-CM | POA: Diagnosis not present

## 2020-06-28 DIAGNOSIS — M7989 Other specified soft tissue disorders: Secondary | ICD-10-CM | POA: Diagnosis not present

## 2020-06-28 DIAGNOSIS — M25572 Pain in left ankle and joints of left foot: Secondary | ICD-10-CM | POA: Diagnosis not present

## 2020-06-28 DIAGNOSIS — M0609 Rheumatoid arthritis without rheumatoid factor, multiple sites: Secondary | ICD-10-CM | POA: Diagnosis not present

## 2020-06-28 DIAGNOSIS — Z6820 Body mass index (BMI) 20.0-20.9, adult: Secondary | ICD-10-CM | POA: Diagnosis not present

## 2020-06-28 DIAGNOSIS — I7300 Raynaud's syndrome without gangrene: Secondary | ICD-10-CM | POA: Diagnosis not present

## 2020-06-29 ENCOUNTER — Telehealth: Payer: Self-pay | Admitting: Internal Medicine

## 2020-06-29 NOTE — Telephone Encounter (Signed)
Patient informed and will keep [place] posted.

## 2020-06-29 NOTE — Telephone Encounter (Signed)
[name] is having diarrhea, she has been 10 times since 2AM. She is doing her lactose and gluten free diet. She is using [place] to sweeten her food. She has been using this for years. She doesn't have a fever. She started back on her Cholestyramine last night and she is trying to use under 8 Lomotil a day. And she takes Lactase pills with any dairy products. I told her to try and stay hydrated. Any other advise Sir?

## 2020-06-29 NOTE — Telephone Encounter (Signed)
It is not the [place]  She should not stop the cholestyramine she can take an extra dose of that today

## 2020-07-05 ENCOUNTER — Encounter: Payer: Self-pay | Admitting: Internal Medicine

## 2020-07-05 ENCOUNTER — Ambulatory Visit: Payer: Medicare PPO | Admitting: Internal Medicine

## 2020-07-05 VITALS — BP 110/60 | HR 68 | Ht 63.5 in | Wt 114.8 lb

## 2020-07-05 DIAGNOSIS — K900 Celiac disease: Secondary | ICD-10-CM | POA: Diagnosis not present

## 2020-07-05 DIAGNOSIS — K52832 Lymphocytic colitis: Secondary | ICD-10-CM | POA: Diagnosis not present

## 2020-07-05 DIAGNOSIS — E739 Lactose intolerance, unspecified: Secondary | ICD-10-CM

## 2020-07-05 DIAGNOSIS — K580 Irritable bowel syndrome with diarrhea: Secondary | ICD-10-CM

## 2020-07-05 MED ORDER — DIPHENOXYLATE-ATROPINE 2.5-0.025 MG PO TABS: 2.0000 | ORAL_TABLET | Freq: Four times a day (QID) | ORAL | 2 refills | Status: DC | PRN

## 2020-07-05 MED ORDER — CHOLESTYRAMINE 4 G PO PACK: 4.0000 g | PACK | Freq: Three times a day (TID) | ORAL | 3 refills | Status: DC

## 2020-07-05 NOTE — Patient Instructions (Addendum)
Diet is very important to control symptoms  Do the following:  1) stop artificial sweetenres  2) eliminate or greatly reduce beans, lentils, etc. Eat the FODMAP safe foods on the lists I gave you.  3) If these things do not work try the 3rd dose of cholestyramine.  4) OK to take cholestyramine with food but take it 1 hour before any meds  See you in May.  I appreciate the opportunity to care for you. Silvano Rusk, MD, [facility]

## 2020-07-05 NOTE — Progress Notes (Signed)
[name] 80 y.o. April 08, 1941 [id number]  Assessment & Plan:   Encounter Diagnoses  Name Primary?   Irritable bowel syndrome with diarrhea Yes   Lymphocytic colitis w/ patchy collagen deposition    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    Lactose intolerance      She is improved but she is eating foods that will trigger diarrhea.  We have been over this before and we reviewed it again.  I have given her a list of FODMAPs type foods to try to avoid and have asked her to stop all artificial sweeteners and see if that helps.  She may need to go to 3 times a day cholestyramine and we developed a plan for that.  Try diet modification and add the cholestyramine and try to use less Lomotil if possible.  Return in about 2 months.  She will also watch lactose ingestion and her gluten.  We talked about specific dietary interventions and the timing of her cholestyramine to be taken 1 hour before other meds or 4 hours after.  She can take that with food which was something she did not realize in fact we typically recommend that but she had it the other way and was taking it on an empty stomach.  I appreciate the opportunity to care for this patient.   CC: [name], MD   Subjective:   Chief Complaint: Diarrhea  HPI [name] has struggled with diarrhea still though she has reached a point where she has a reasonable quality of life as long as she does not eat after 5 PM and she takes cholestyramine 4 g twice a day.  It turns out though she is eating a lot of lentils and beans and high-fiber foods.  A lot of fruit and cereal for breakfast.  If she has food after 5:00 she will be having diarrhea from 10 PM to about 4 AM.  She is satisfied with this quality of life she says.  She continues to use Lomotil as well several times a day.  She uses a lot of [place] which is an artificial sweetener.  Wt Readings from Last 3 Encounters:  07/05/20 114 lb 12.8 oz (52.1 kg)  05/31/20 116 lb (52.6 kg)   05/06/20 117 lb 3.2 oz (53.2 kg)     Allergies  Allergen Reactions   Cox-2 Inhibitors Nausea And Vomiting    Severe abd pain   Glucose Diarrhea    cramps   Lactose Intolerance (Gi) Diarrhea    Stomach cramps   Rosuvastatin Diarrhea   Current Meds  Medication Sig   acetaminophen (TYLENOL) 500 MG tablet Take 500-1,000 mg by mouth every 8 (eight) hours as needed for moderate pain or headache.    calcium carbonate (TUMS EX) 750 MG chewable tablet Chew 1,500 mg by mouth 2 (two) times daily.    Cholecalciferol (DIALYVITE VITAMIN D 5000 PO) Take 1 capsule by mouth daily.   cholestyramine (QUESTRAN) 4 g packet TAKE 1 PACKET (4 G TOTAL) BY MOUTH DAILY. (Patient taking differently: Take 8 g by mouth daily.)   diphenoxylate-atropine (LOMOTIL) 2.5-0.025 MG tablet TAKE 2 TABLETS BY MOUTH 4 (FOUR) TIMES DAILY AS NEEDED FOR DIARRHEA OR LOOSE STOOLS. MAXIMUM 8 TABLETS DAILY   ELIQUIS 2.5 MG TABS tablet TAKE 1 TABLET BY MOUTH TWICE A DAY   esomeprazole (NEXIUM) 40 MG capsule TAKE 1 CAPSULE(40 MG) BY MOUTH DAILY BEFORE A MEAL   finasteride (PROSCAR) 5 MG tablet    hydrocortisone cream 1 % Apply 1  application topically daily as needed for itching (burns).   InFLIXimab (REMICADE IV) Inject 1 Dose into the vein See admin instructions. Remicade Infusion every 2 months   Lactase (LACTAID PO) Take 1 tablet by mouth as needed.   metoprolol tartrate (LOPRESSOR) 25 MG tablet Take 1 tablet (25 mg total) by mouth 2 (two) times daily.   minoxidil (ROGAINE) 2 % external solution Apply 1 application topically 2 (two) times daily.   Multiple Vitamins-Minerals (PRESERVISION AREDS 2) CAPS Take 1 capsule by mouth 2 (two) times daily.   neomycin-bacitracin-polymyxin (NEOSPORIN) ointment Apply 1 application topically as needed for wound care.   nystatin-triamcinolone ointment (MYCOLOG) Apply 1 application topically 2 (two) times daily.   Polyethyl Glycol-Propyl Glycol (SYSTANE) 0.4-0.3 % GEL  ophthalmic gel Place 1 application into both eyes at bedtime.   rosuvastatin (CRESTOR) 5 MG tablet One po 3 times a week   SOOLANTRA 1 % CREA Apply 1 application topically daily.    Past Medical History:  Diagnosis Date   Allergy    seasonal   Anemia    Cataract    bil cateracts removed   Chronic constipation    Diarrhea    chronic diarrheafor over 2 months   GERD (gastroesophageal reflux disease)    Hyperlipidemia    Lymphocytic colitis w/ patchy collagen deposition 09/28/2016   Rheumatoid arthritis (HCC)    Status post dilation of esophageal narrowing 2014   Wears glasses    Past Surgical History:  Procedure Laterality Date   CARDIOVERSION N/A 05/11/2019   Procedure: CARDIOVERSION;  Surgeon: Buford Dresser, MD;  Location: [facility] END[facility]COPY;  Service: Cardiovascular;  Laterality: N/A;   CATARACT EXTRACTION  06/30/2016 -right eye   left eye in 08/15/16   COLONOSCOPY     DILATION AND CURETTAGE OF UTERUS  2009   ESOPHAGEAL DILATION  05/31/1997   REPAIR EXTENSOR TENDON Right 05/14/2013   Procedure: LONG FINGER EXTENSOR TO ELEVATE RING AND SMALL FINGERS ;  Surgeon: Cammie Sickle., MD;  Location: [place];  Service: Orthopedics;  Laterality: Right;   seborrheic keratosis  09/03/12   inflamed, excised from forehead   TENDON TRANSFER Right 09/03/2013   Procedure: RIGHT HAND FLEXOR CARPI RADIALUS TRANSFER TO LONG/RING/SMALL;  Surgeon: Cammie Sickle., MD;  Location: [place];  Service: Orthopedics;  Laterality: Right;   TONSILLECTOMY     TUBAL LIGATION     ULNAR HEAD EXCISION Right 05/14/2013   Procedure: ARTHOPLASTY DISTAL RADIAL ULNAR JOINT, RIGHT ;  Surgeon: Cammie Sickle., MD;  Location: [place];  Service: Orthopedics;  Laterality: Right;   UPPER GASTROINTESTINAL ENDOSCOPY     Social History   Social History Narrative   Health Care POA: Husband, Fred   Emergency Contact: Fred   End of Life  Plan:    Who lives with you: husband   Son, in [place]   Any pets: 3 cats   Diet: Pt has a varied diet of protein, starch, and vegetables.   Exercise: Pt works with trainer 2x a week for 60 minutes   Seatbelts: Pt reports wearing seatbelt when in vehicle.   Sun Exposure/Protection: Pt reports not using sun protection.   Hobbies: gardening, reading, concerts               family history includes COPD in her father; Colon cancer in her paternal uncle; Heart disease in her mother; Osteoporosis in her maternal aunt, maternal grandmother, and mother.   Review of  Systems As above  Objective:   Physical Exam BP 110/60    Pulse 68 Comment: slightly irregular   Ht 5' 3.5" (1.613 m)    Wt 114 lb 12.8 oz (52.1 kg)    BMI 20.02 kg/m    30 minutes time

## 2020-07-11 ENCOUNTER — Other Ambulatory Visit: Payer: Self-pay

## 2020-07-11 ENCOUNTER — Ambulatory Visit: Payer: Medicare PPO | Admitting: Podiatry

## 2020-07-15 ENCOUNTER — Ambulatory Visit: Payer: Medicare PPO | Admitting: Podiatry

## 2020-07-15 ENCOUNTER — Other Ambulatory Visit: Payer: Self-pay

## 2020-07-15 DIAGNOSIS — B351 Tinea unguium: Secondary | ICD-10-CM | POA: Diagnosis not present

## 2020-07-15 DIAGNOSIS — M79674 Pain in right toe(s): Secondary | ICD-10-CM | POA: Diagnosis not present

## 2020-07-15 DIAGNOSIS — M79675 Pain in left toe(s): Secondary | ICD-10-CM | POA: Diagnosis not present

## 2020-07-21 ENCOUNTER — Encounter: Payer: Self-pay | Admitting: Podiatry

## 2020-07-21 DIAGNOSIS — M0609 Rheumatoid arthritis without rheumatoid factor, multiple sites: Secondary | ICD-10-CM | POA: Diagnosis not present

## 2020-07-21 NOTE — Progress Notes (Signed)
Subjective: [name] is a pleasant 80 y.o. female patient seen today painful thick toenails that are difficult to trim. Pain interferes with ambulation. Aggravating factors include wearing enclosed shoe gear. Pain is relieved with periodic professional debridement.  She voices no new pedal concerns on today's visit.   Allergies  Allergen Reactions  . Cox-2 Inhibitors Nausea And Vomiting    Severe abd pain  . Glucose Diarrhea    cramps  . Lactose Intolerance (Gi) Diarrhea    Stomach cramps  . Rosuvastatin Diarrhea    Objective: Physical Exam  General: [name] is a pleasant 79 y.o. Caucasian female, WD, WN in NAD. AAO x 3.   Vascular:  Capillary refill time to digits immediate b/l. Palpable pedal pulses b/l LE. Pedal hair present b/l lower extremities. Lower extremity skin temperature gradient within normal limits. No pain with calf compression b/l. No edema noted b/l lower extremities. She has spider veins noted medial aspect of both ankles.  Dermatological:  Pedal skin with normal turgor, texture and tone bilaterally. No open wounds bilaterally. No interdigital macerations bilaterally. Toenails 1-5 b/l elongated, discolored, dystrophic, thickened, crumbly with subungual debris and tenderness to dorsal palpation. No hyperkeratotic nor porokeratotic lesions present on today's visit.  Musculoskeletal:  Normal muscle strength 5/5 to all lower extremity muscle groups bilaterally. No pain crepitus or joint limitation noted with ROM b/l. No gross bony deformities bilaterally. Patient ambulates independent of any assistive aids.  Neurological:  Protective sensation intact 5/5 intact bilaterally with 10g monofilament b/l. Vibratory sensation intact b/l. Clonus negative b/l.  Assessment and Plan:  1. Pain due to onychomycosis of toenails of both feet     -Examined patient. -No new findings. No new orders. -Toenails 1-5 b/l were debrided in length and girth with sterile  nail nippers and dremel without iatrogenic bleeding.  -Patient to report any pedal injuries to medical professional immediately. -Patient to continue soft, supportive shoe gear daily. -Patient/POA to call should there be question/concern in the interim.  Return in about 3 months (around 10/15/2020).  Marzetta Board, DPM

## 2020-07-22 ENCOUNTER — Telehealth: Payer: Self-pay | Admitting: Cardiology

## 2020-07-22 NOTE — Telephone Encounter (Signed)
Spoke with pt, she reports she gets remicaide infusions once a month and her bp is always low when she goes. She is currently is taking 25 mg of metoprolol in the am and 12.5 mg in the evening. She does not have a way of checking her bp at home. She reports within the last 2 weeks she has also noticed she is very tired, exhausted and is having some dizziness first thing in the morning. She is wanting to lower the dose of metoprolol. Aware will forward to dr Gardiner Rhyme to review and advise.

## 2020-07-22 NOTE — Telephone Encounter (Signed)
Let's switch to toprol XL 25 mg daily

## 2020-07-22 NOTE — Telephone Encounter (Signed)
  Pt c/o BP issue: STAT if pt c/o blurred vision, one-sided weakness or slurred speech  1. What are your last 5 BP readings?   104/78 85/57 91/61  98/66 95/68  These were all from yesterday while she was getting her infusion done.  2. Are you having any other symptoms (ex. Dizziness, headache, blurred vision, passed out)? Some dizziness yesterday, happens when she gets up in the mornings also   3. What is your BP issue?   Wants to know if she can lower her dosage of metoprolol tartrate (LOPRESSOR) 25 MG tablet to see if it would go up some  If doesn't answer home phone, please call cell number.

## 2020-07-22 NOTE — Telephone Encounter (Signed)
Left message for pt to call.

## 2020-07-25 MED ORDER — METOPROLOL SUCCINATE ER 25 MG PO TB24: 25.0000 mg | ORAL_TABLET | Freq: Every day | ORAL | 3 refills | Status: DC

## 2020-07-25 NOTE — Telephone Encounter (Signed)
Spoke to patient, aware of recommendations and verbalized understanding.  rx sent to pharmacy

## 2020-08-03 ENCOUNTER — Telehealth: Payer: Self-pay | Admitting: Internal Medicine

## 2020-08-03 MED ORDER — ESOMEPRAZOLE MAGNESIUM 40 MG PO CPDR: DELAYED_RELEASE_CAPSULE | ORAL | 3 refills | Status: DC

## 2020-08-03 NOTE — Telephone Encounter (Signed)
Generic Nexium refilled as patient requested. She is up to date on her visits.

## 2020-08-03 NOTE — Telephone Encounter (Signed)
Inbound call from patient requesting a refill for esomeprazole be sent to [facility] on [address] please.

## 2020-08-10 DIAGNOSIS — L649 Androgenic alopecia, unspecified: Secondary | ICD-10-CM | POA: Diagnosis not present

## 2020-08-10 DIAGNOSIS — L245 Irritant contact dermatitis due to other chemical products: Secondary | ICD-10-CM | POA: Diagnosis not present

## 2020-08-19 DIAGNOSIS — Z1231 Encounter for screening mammogram for malignant neoplasm of breast: Secondary | ICD-10-CM | POA: Diagnosis not present

## 2020-08-19 LAB — HM MAMMOGRAPHY

## 2020-08-23 ENCOUNTER — Encounter: Payer: Self-pay | Admitting: Internal Medicine

## 2020-09-06 ENCOUNTER — Encounter: Payer: Self-pay | Admitting: Internal Medicine

## 2020-09-06 ENCOUNTER — Ambulatory Visit: Payer: Medicare PPO | Admitting: Internal Medicine

## 2020-09-06 VITALS — BP 100/60 | HR 76 | Ht 63.5 in | Wt 114.2 lb

## 2020-09-06 DIAGNOSIS — K900 Celiac disease: Secondary | ICD-10-CM | POA: Diagnosis not present

## 2020-09-06 DIAGNOSIS — K580 Irritable bowel syndrome with diarrhea: Secondary | ICD-10-CM | POA: Diagnosis not present

## 2020-09-06 DIAGNOSIS — E739 Lactose intolerance, unspecified: Secondary | ICD-10-CM

## 2020-09-06 DIAGNOSIS — K52832 Lymphocytic colitis: Secondary | ICD-10-CM

## 2020-09-06 NOTE — Patient Instructions (Signed)
So glad your doing well on the FODMAP diet.  Please call us in September for a November appointment.   I appreciate the opportunity to care for you. Silvano Rusk, MD, [facility]

## 2020-09-06 NOTE — Progress Notes (Addendum)
[name] 79 y.o. [birth year] [id number]  Assessment & Plan:   Encounter Diagnoses  Name Primary?   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  Subjective:   Chief Complaint:  HPI [name] is an 80 year old white woman with a history of IBS-D, lymphocytic colitis with patchy collagen deposition and suspected celiac disease.  She also has lactose intolerance and is on a diet for rosacea.  Here for follow-up having been seen in March at which point I suggested she try a FODMAPs restricted diet.  She has been working on that and has eliminated the need for cholestyramine and is greatly reduced her diphenoxylate and atropine use.  She did have some Coca-Cola last night and "paid for it" with diarrhea.  Overall she is pleased with her trajectory.  Wt Readings from Last 3 Encounters:  09/06/20 114 lb 4 oz (51.8 kg)  07/05/20 114 lb 12.8 oz (52.1 kg)  05/31/20 116 lb (52.6 kg)   She is contemplating moving to [facility] at some point. Allergies  Allergen Reactions   Cox-2 Inhibitors Nausea And Vomiting    Severe abd pain   Glucose Diarrhea    cramps   Lactose Intolerance (Gi) Diarrhea    Stomach cramps   Rosuvastatin Diarrhea   Current Meds  Medication Sig   acetaminophen (TYLENOL) 500 MG tablet Take 500-1,000 mg by mouth every 8 (eight) hours as needed for moderate pain or headache.    calcium carbonate (TUMS EX) 750 MG chewable tablet Chew 1,500 mg by mouth 2 (two) times daily.    Cholecalciferol (DIALYVITE VITAMIN D 5000 PO) Take 1 capsule by mouth daily.   cholestyramine (QUESTRAN) 4 g packet Take 1 packet (4 g total) by mouth 3 (three) times daily with meals.   diphenoxylate-atropine (LOMOTIL) 2.5-0.025 MG tablet Take 2 tablets by mouth 4 (four) times daily as needed for diarrhea or loose stools. Maximum 8 tablets daily    ELIQUIS 2.5 MG TABS tablet TAKE 1 TABLET BY MOUTH TWICE A DAY   esomeprazole (NEXIUM) 40 MG capsule TAKE 1 CAPSULE(40 MG) BY MOUTH DAILY BEFORE A MEAL   finasteride (PROSCAR) 5 MG tablet    hydrocortisone cream 1 % Apply 1 application topically daily as needed for itching (burns).   InFLIXimab (REMICADE IV) Inject 1 Dose into the vein See admin instructions. Remicade Infusion every 2 months   Lactase (LACTAID PO) Take 1 tablet by mouth as needed.   metoprolol succinate (TOPROL XL) 25 MG 24 hr tablet Take 1 tablet (25 mg total) by mouth daily.   minoxidil (ROGAINE) 2 % external solution Apply 1 application topically 2 (two) times daily.   Multiple Vitamins-Minerals (PRESERVISION AREDS 2) CAPS Take 1 capsule by mouth 2 (two) times daily.   neomycin-bacitracin-polymyxin (NEOSPORIN) ointment Apply 1 application topically as needed for wound care.   nystatin-triamcinolone ointment (MYCOLOG) Apply 1 application topically 2 (two) times daily.   Polyethyl Glycol-Propyl Glycol (SYSTANE) 0.4-0.3 % GEL ophthalmic gel Place 1 application into both eyes at bedtime.   rosuvastatin (CRESTOR) 5 MG tablet One po 3 times a week   SOOLANTRA 1 % CREA Apply 1 application topically daily.    Past Medical History:  Diagnosis Date   Allergy    seasonal   Anemia    Cataract    bil cateracts removed   Chronic  constipation    Diarrhea    chronic diarrheafor over 2 months   GERD (gastroesophageal reflux disease)    Hyperlipidemia    Lymphocytic colitis w/ patchy collagen deposition 09/28/2016   Rheumatoid arthritis (HCC)    Status post dilation of esophageal narrowing 2014   Wears glasses    Past Surgical History:  Procedure Laterality Date   CARDIOVERSION N/A 05/11/2019   Procedure: CARDIOVERSION;  Surgeon: Buford Dresser, MD;  Location: [place];  Service: Cardiovascular;  Laterality: N/A;   CATARACT EXTRACTION  06/30/2016 -right eye   left eye in 08/15/16   COLONOSCOPY     DILATION AND  CURETTAGE OF UTERUS  2009   ESOPHAGEAL DILATION  05/31/1997   REPAIR EXTENSOR TENDON Right 05/14/2013   Procedure: LONG FINGER EXTENSOR TO ELEVATE RING AND SMALL FINGERS ;  Surgeon: Cammie Sickle., MD;  Location: [place];  Service: Orthopedics;  Laterality: Right;   seborrheic keratosis  09/03/12   inflamed, excised from forehead   TENDON TRANSFER Right 09/03/2013   Procedure: RIGHT HAND FLEXOR CARPI RADIALUS TRANSFER TO LONG/RING/SMALL;  Surgeon: Cammie Sickle., MD;  Location: [place];  Service: Orthopedics;  Laterality: Right;   TONSILLECTOMY     TUBAL LIGATION     ULNAR HEAD EXCISION Right 05/14/2013   Procedure: ARTHOPLASTY DISTAL RADIAL ULNAR JOINT, RIGHT ;  Surgeon: Cammie Sickle., MD;  Location: [place];  Service: Orthopedics;  Laterality: Right;   UPPER GASTROINTESTINAL ENDOSCOPY     Social History   Social History Narrative   Health Care POA: Husband, Fred   Emergency Contact: Fred   End of Life Plan:    Who lives with you: husband   Son, in [place]   Any pets: 3 cats   Diet: Pt has a varied diet of protein, starch, and vegetables.   Exercise: Pt works with trainer 2x a week for 60 minutes   Seatbelts: Pt reports wearing seatbelt when in vehicle.   Sun Exposure/Protection: Pt reports not using sun protection.   Hobbies: gardening, reading, concerts               family history includes COPD in her father; Colon cancer in her paternal uncle; Heart disease in her mother; Osteoporosis in her maternal aunt, maternal grandmother, and mother.   Review of Systems As per HPI  Objective:   Physical Exam BP 100/60 (BP Location: Left Arm, Patient Position: Sitting, Cuff Size: Normal)   Pulse 76   Ht 5' 3.5" (1.613 m)   Wt 114 lb 4 oz (51.8 kg)   BMI 19.92 kg/m

## 2020-09-15 DIAGNOSIS — M0609 Rheumatoid arthritis without rheumatoid factor, multiple sites: Secondary | ICD-10-CM | POA: Diagnosis not present

## 2020-09-15 DIAGNOSIS — Z79899 Other long term (current) drug therapy: Secondary | ICD-10-CM | POA: Diagnosis not present

## 2020-09-15 DIAGNOSIS — R5383 Other fatigue: Secondary | ICD-10-CM | POA: Diagnosis not present

## 2020-09-17 ENCOUNTER — Other Ambulatory Visit: Payer: Self-pay | Admitting: Cardiology

## 2020-09-19 NOTE — Telephone Encounter (Signed)
45f 51.8kg, scr 0.72 03/15/20, lovw/schumann 05/06/20

## 2020-09-22 ENCOUNTER — Other Ambulatory Visit: Payer: Self-pay

## 2020-09-22 ENCOUNTER — Ambulatory Visit: Payer: Medicare PPO | Admitting: Sports Medicine

## 2020-09-22 DIAGNOSIS — M79672 Pain in left foot: Secondary | ICD-10-CM

## 2020-09-22 NOTE — Progress Notes (Signed)
  [name] - 80 y.o. female MRN [id number]  Date of birth: [birth year]  SUBJECTIVE:    CC: Left Heel Pain  Tkai presents today complaining of pain in her Left heel that began this morning when she stepped out of bed. It was painful to walk down the stairs. She took two tylenol and the pain has improved. She has been able to walk the rest of the day without pain. The pain is located at the plantar surface of her heel. She reports a history of plantar fascitis several years ago that was treated and resolved. She denies any recent trauma to the foot. 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    Objective:  VS: BP:112/72  HR: bpm  TEMP: ( )  RESP:   HT:5' 4"  (162.6 cm)   WT:110 lb (49.9 kg)  BMI:18.87 PHYSICAL EXAM: No flowsheet data found.   Left foot: Not swollen or erythematous. Tender to palpation with a palpable bony prominence at the plantar aspect of the medial calcaneous - closer to middle than insertion . Decreased padding at the heel.  Ligaments intact.  Foot shape is cavus Chronic ankle swelling with sinus tarsi puffy Medial malloelus shows spurring  Left foot ultrasound: Calcaneous wi bony irregularity over the os calcis.  Small calcifications. Plantar fascia of normal thickness without effusion. 0.43 cms  Impression: Os Calcis contusion with bony irregularity  Ultrasound and interpretation by Wolfgang Phoenix. Fields, MD   ASSESSMENT & PLAN:  Left Os Calcis Contuionssion: Her history of heel pain with physical exam findings of bony prominence and decreased heel pads most likely represents an os calcic contusion. Plantar fascitis is less likely as it demonstrated normal thickness of the plantar fascia. We have added a heel support to her previous orthotics today. She is advised to ice the heel 3-4 times daily and take tylenol as needed for pain relief. She can follow up on an as needed basis if the pain does not improve.   Marcelino Duster,  MS4  I observed and examined the patient with theMS 4t and agree with assessment and plan.  Note reviewed and modified by me.  Ila Mcgill, MD

## 2020-09-22 NOTE — Patient Instructions (Addendum)
It was great to see you today!  -You have an os calcis contusion. -Ice 3-4 times a day for 15 minutes -Try OTC tylenol for pain relief -Use the cushion for your heel  Try this for a few weeks and follow up with [place] if not improving.

## 2020-09-29 ENCOUNTER — Other Ambulatory Visit: Payer: Self-pay | Admitting: Internal Medicine

## 2020-10-28 ENCOUNTER — Encounter: Payer: Self-pay | Admitting: Podiatry

## 2020-10-28 ENCOUNTER — Other Ambulatory Visit: Payer: Self-pay

## 2020-10-28 ENCOUNTER — Ambulatory Visit: Payer: Medicare PPO | Admitting: Podiatry

## 2020-10-28 DIAGNOSIS — B351 Tinea unguium: Secondary | ICD-10-CM

## 2020-10-28 DIAGNOSIS — M79675 Pain in left toe(s): Secondary | ICD-10-CM | POA: Diagnosis not present

## 2020-10-28 DIAGNOSIS — M79674 Pain in right toe(s): Secondary | ICD-10-CM

## 2020-10-28 NOTE — Progress Notes (Signed)
Subjective: [name] is a pleasant 80 y.o. female patient seen today painful thick toenails that are difficult to trim. Pain interferes with ambulation. Aggravating factors include wearing enclosed shoe gear. Pain is relieved with periodic professional debridement.  Patient states she and her husband will be selling their home and moving into [employer].  PCP is Baxley, Cresenciano Lick, MD. Last visit was: 05/31/2020.  Allergies  Allergen Reactions   Cox-2 Inhibitors Nausea And Vomiting    Severe abd pain   Glucose Diarrhea    cramps   Lactose Intolerance (Gi) Diarrhea    Stomach cramps   Rosuvastatin Diarrhea    Objective: Physical Exam  General: [name] is a pleasant 80 y.o. Caucasian female, WD, WN in NAD. AAO x 3.   Vascular:  Capillary refill time to digits immediate b/l. Palpable pedal pulses b/l LE. Pedal hair present. Lower extremity skin temperature gradient within normal limits. No pain with calf compression b/l. No edema noted b/l lower extremities.  Dermatological:  Pedal skin with normal turgor, texture and tone b/l lower extremities No open wounds b/l lower extremities No interdigital macerations b/l lower extremities Toenails 1-5 b/l elongated, discolored, dystrophic, thickened, crumbly with subungual debris and tenderness to dorsal palpation.  Musculoskeletal:  Normal muscle strength 5/5 to all lower extremity muscle groups bilaterally. No pain crepitus or joint limitation noted with ROM b/l. Hammertoe(s) noted to the 2-5 bilaterally.  Neurological:  Protective sensation intact 5/5 intact bilaterally with 10g monofilament b/l. Vibratory sensation intact b/l. Proprioception intact bilaterally.  Assessment and Plan:  1. Pain due to onychomycosis of toenails of both feet    -Patient to continue soft, supportive shoe gear daily. -Toenails 1-5 b/l were debrided in length and girth with sterile nail nippers and dremel without iatrogenic  bleeding.  -Patient to report any pedal injuries to medical professional immediately. -Patient/POA to call should there be question/concern in the interim.  Return in about 3 months (around 01/28/2021).  Marzetta Board, DPM

## 2020-11-10 DIAGNOSIS — M0609 Rheumatoid arthritis without rheumatoid factor, multiple sites: Secondary | ICD-10-CM | POA: Diagnosis not present

## 2020-11-21 ENCOUNTER — Ambulatory Visit: Payer: Medicare PPO | Admitting: Cardiology

## 2020-12-22 NOTE — Progress Notes (Signed)
Cardiology Office Note:    Date:  12/23/2020   ID:  [name], DOB 09-01-1940, MRN [id number]  PCP:  [name], MD  Cardiologist:  None  Electrophysiologist:  None   Referring MD: [name], MD   No chief complaint on file.   History of Present Illness:    [name] is a 80 y.o. female with a hx of  hyperlipidemia, rheumatoid arthritis, lymphocytic colitis, GERD who presents for follow-up.  She was initially seen on 04/03/2019, had been referred by Dr. Renold Genta for evaluation of new onset atrial fibrillation.  At initial clinic visit, she was noted to be in AF with rates 110s, was started on metoprolol 25 mg twice daily for rate control.  She was started on Eliquis 2.5 mg twice daily for anticoagulation.  TTE on 04/30/2019 showed EF 60 to 65%, normal RV function, severe left atrial dilatation, mild right atrial dilatation, mild MR, mild AI.  On 05/11/2019, she underwent a successful DCCV to restore sinus rhythm.  Following cardioversion, she reported that her heart rate would fall to the 40s, metoprolol dose was decreased to 12.5 mg twice daily.  At follow-up clinic on 07/03/2019, she was noted to be back in atrial fibrillation.  She was referred to the A. fib clinic, and rhythm control options were discussed but patient elected to continue with rate control strategy.  Zio patch x3 days on 12/02/2019 showed 100s in AF burden with average rate 78 bpm.  Since her last clinic visit, she reports that she is doing well.  Denies any chest pain, dyspnea, lightheadedness, syncope, or palpitations.  Feels very tired in afternoon.  Often drinks 2 glasses of wine at lunch.  Drinks about 10 glasses of wine per week.  Does have chronic left ankle swelling.  Has been working out 3 times per week with a trainer, workouts last for about an hour.  She is taking Eliquis, denies any bleeding issues.   Wt Readings from Last 3 Encounters:  12/23/20 116 lb 6.4 oz (52.8 kg)  09/22/20 110 lb (49.9 kg)   09/06/20 114 lb 4 oz (51.8 kg)     Past Medical History:  Diagnosis Date   Allergy    seasonal   Anemia    Cataract    bil cateracts removed   Chronic constipation    Diarrhea    chronic diarrheafor over 2 months   GERD (gastroesophageal reflux disease)    Hyperlipidemia    Lymphocytic colitis w/ patchy collagen deposition 09/28/2016   Rheumatoid arthritis ([place])    Status post dilation of esophageal narrowing 2014   Wears glasses     Past Surgical History:  Procedure Laterality Date   CARDIOVERSION N/A 05/11/2019   Procedure: CARDIOVERSION;  Surgeon: Buford Dresser, MD;  Location: [facility] ENDOSCOPY;  Service: Cardiovascular;  Laterality: N/A;   CATARACT EXTRACTION  06/30/2016 -right eye   left eye in 08/15/16   COLONOSCOPY     DILATION AND CURETTAGE OF UTERUS  2009   ESOPHAGEAL DILATION  05/31/1997   REPAIR EXTENSOR TENDON Right 05/14/2013   Procedure: LONG FINGER EXTENSOR TO ELEVATE RING AND SMALL FINGERS ;  Surgeon: Cammie Sickle., MD;  Location: [facility];  Service: Orthopedics;  Laterality: Right;   seborrheic keratosis  09/03/12   inflamed, excised from forehead   TENDON TRANSFER Right 09/03/2013   Procedure: RIGHT HAND FLEXOR CARPI RADIALUS TRANSFER TO LONG/RING/SMALL;  Surgeon: Cammie Sickle., MD;  Location: [facility];  Service: Orthopedics;  Laterality: Right;   TONSILLECTOMY     TUBAL LIGATION     ULNAR HEAD EXCISION Right 05/14/2013   Procedure: ARTHOPLASTY DISTAL RADIAL ULNAR JOINT, RIGHT ;  Surgeon: Cammie Sickle., MD;  Location: [facility];  Service: Orthopedics;  Laterality: Right;   UPPER GASTROINTESTINAL ENDOSCOPY      Current Medications: Current Meds  Medication Sig   acetaminophen (TYLENOL) 500 MG tablet Take 500-1,000 mg by mouth every 8 (eight) hours as needed for moderate pain or headache.    calcium carbonate (TUMS EX) 750 MG chewable tablet Chew 1,500 mg by mouth 2 (two) times daily.     Cholecalciferol (DIALYVITE VITAMIN D 5000 PO) Take 1 capsule by mouth daily.   diphenoxylate-atropine (LOMOTIL) 2.5-0.025 MG tablet Take 2 tablets by mouth 4 (four) times daily as needed for diarrhea or loose stools. Maximum 8 tablets daily   ELIQUIS 2.5 MG TABS tablet TAKE 1 TABLET BY MOUTH TWICE A DAY   esomeprazole (NEXIUM) 40 MG capsule TAKE 1 CAPSULE(40 MG) BY MOUTH DAILY BEFORE A MEAL   finasteride (PROSCAR) 5 MG tablet    gabapentin (NEURONTIN) 300 MG capsule TAKE 1 CAPSULE BY MOUTH AT BEDTIME AS NEEDED FOR LEG CRAMPS   hydrocortisone cream 1 % Apply 1 application topically daily as needed for itching (burns).   InFLIXimab (REMICADE IV) Inject 1 Dose into the vein See admin instructions. Remicade Infusion every 2 months   Lactase (LACTAID PO) Take 1 tablet by mouth as needed.   metoprolol succinate (TOPROL XL) 25 MG 24 hr tablet Take 1 tablet (25 mg total) by mouth daily.   minoxidil (ROGAINE) 2 % external solution Apply 1 application topically 2 (two) times daily.   Multiple Vitamins-Minerals (PRESERVISION AREDS 2) CAPS Take 1 capsule by mouth 2 (two) times daily.   neomycin-bacitracin-polymyxin (NEOSPORIN) ointment Apply 1 application topically as needed for wound care.   Polyethyl Glycol-Propyl Glycol (SYSTANE) 0.4-0.3 % GEL ophthalmic gel Place 1 application into both eyes at bedtime.   SOOLANTRA 1 % CREA Apply 1 application topically daily.      Allergies:   Cox-2 inhibitors, Glucose, Lactose intolerance (gi), and Rosuvastatin   Social History   Socioeconomic History   Marital status: Married    Spouse name: [name]   Number of children: 1   Years of education: 16   Highest education level: Not on file  Occupational History   Occupation: Retired- Scientist, research (physical sciences): RETIRED  Tobacco Use   Smoking status: Never   Smokeless tobacco: Never  Vaping Use   Vaping Use: Never used  Substance and Sexual Activity   Alcohol use: Yes    Alcohol/week: 21.0 standard drinks     Types: 21 Standard drinks or equivalent per week    Comment: 3 times weekly   Drug use: No   Sexual activity: Not on file  Other Topics Concern   Not on file  Social History Narrative   Health Care POA: Husband, Fred   Emergency Contact: Wilfrid Lund of Life Plan:    Who lives with you: husband   Son, in [place]   Any pets: 3 cats   Diet: Pt has a varied diet of protein, starch, and vegetables.   Exercise: Pt works with trainer 2x a week for 60 minutes   Seatbelts: Pt reports wearing seatbelt when in vehicle.   Sun Exposure/Protection: Pt reports not using sun protection.   Hobbies: gardening, reading,  concerts               Social Determinants of Health   Financial Resource Strain: Not on file  Food Insecurity: Not on file  Transportation Needs: Not on file  Physical Activity: Not on file  Stress: Not on file  Social Connections: Not on file     Family History: The patient's family history includes COPD in her father; Colon cancer in her paternal uncle; Heart disease in her mother; Osteoporosis in her maternal aunt, maternal grandmother, and mother. There is no history of Esophageal cancer, Pancreatic cancer, Rectal cancer, or Stomach cancer.  ROS:   Please see the history of present illness.    A whole 8 I get asked the minimum of once a day bowel dialysis I assessment all other systems reviewed and are negative.  EKGs/Labs/Other Studies Reviewed:    The following studies were reviewed today:   EKG:  EKG is  ordered today.  The ekg ordered today demonstrates atrial fibrillation, rate 71  Recent Labs: 03/15/2020: ALT 13; BUN 12; Creat 0.72; Hemoglobin 13.5; Platelets 184; Potassium 4.6; Sodium 143; TSH 4.10  Recent Lipid Panel    Component Value Date/Time   CHOL 179 03/15/2020 1107   TRIG 68 03/15/2020 1107   HDL 106 03/15/2020 1107   CHOLHDL 1.7 03/15/2020 1107   VLDL 19 08/03/2016 1001   LDLCALC 59 03/15/2020 1107   LDLDIRECT 149 (H) 11/11/2007 2043   TTE  04/30/19: 1. Left ventricular ejection fraction, by visual estimation, is 60 to  65%. The left ventricle has normal function. Left ventricular septal wall  thickness was normal. Normal left ventricular posterior wall thickness.  There is no left ventricular  hypertrophy.   2. Left ventricular diastolic parameters are indeterminate.   3. The left ventricle has no regional wall motion abnormalities.   4. Global right ventricle has normal systolic function.The right  ventricular size is normal. No increase in right ventricular wall  thickness.   5. Left atrial size was severely dilated.   6. Right atrial size was mildly dilated.   7. The mitral valve is normal in structure. Mild mitral valve  regurgitation. No evidence of mitral stenosis.   8. The tricuspid valve is normal in structure.   9. The aortic valve is tricuspid. Aortic valve regurgitation is mild. No  evidence of aortic valve sclerosis or stenosis.  10. The pulmonic valve was normal in structure. Pulmonic valve  regurgitation is not visualized.  11. Normal pulmonary artery systolic pressure.  12. The inferior vena cava is dilated in size with >50% respiratory  variability, suggesting right atrial pressure of 8 mmHg.   Physical Exam:    VS:  BP 110/70 (BP Location: Right Arm, Patient Position: Sitting, Cuff Size: Normal)   Pulse 79   Ht 5' 4"  (1.626 m)   Wt 116 lb 6.4 oz (52.8 kg)   SpO2 98%   BMI 19.98 kg/m     Wt Readings from Last 3 Encounters:  12/23/20 116 lb 6.4 oz (52.8 kg)  09/22/20 110 lb (49.9 kg)  09/06/20 114 lb 4 oz (51.8 kg)     GEN: in no acute distress HEENT: Normal NECK: No JVD CARDIAC: irregular,  no murmurs, rubs, gallops RESPIRATORY:  Clear to auscultation without rales, wheezing or rhonchi  ABDOMEN: Soft, non-tender, non-distended MUSCULOSKELETAL:  No edema; No deformity  SKIN: Warm and dry NEUROLOGIC:  Alert and oriented x 3 PSYCHIATRIC:  Normal affect   ASSESSMENT:    1.  Atrial  fibrillation, unspecified type ([place])   2. Hyperlipidemia, unspecified hyperlipidemia type     PLAN:     Atrial fibrillation: CHADS-VASc score 3 (agex2, female).  Appears rate controlled on metoprolol.  Successful DCCV on 05/11/2019, however was back in atrial fibrillation at follow-up appointment on 07/03/2019.  TTE shows normal LV systolic function, severe left atrial dilatation.  She was seen in AF clinic and decided to pursue rate control strategy as rates well controlled and appears asymptomatic.  Zio patch x3 days on 12/02/2019 showed 100% AF burden with average rate 78 bpm. -Continue Eliquis.  Given age>80 and weight <60kg, meets indication for reduced dose.  Will continue 2.5 mg twice daily -Continue metoprolol 25 mg daily   Hyperlipidemia: LDL 118 05/2199, she started on rosuvastatin 5 mg three times weekly and LDL improved to 59 02/2020.  She stopped taking the rosuvastatin.  Recommend calcium score for further restratification.  If calcium score elevated, would encourage restarting statin  RTC in 6 months   Medication Adjustments/Labs and Tests Ordered: Current medicines are reviewed at length with the patient today.  Concerns regarding medicines are outlined above.  No orders of the defined types were placed in this encounter.  No orders of the defined types were placed in this encounter.   Patient Instructions  Medication Instructions:  Your physician recommends that you continue on your current medications as directed. Please refer to the Current Medication list given to you today.  *If you need a refill on your cardiac medications before your next appointment, please call your pharmacy*  Testing/Procedures: CT coronary calcium score. This test is done at [address]. This is $99 out of pocket.   Coronary CalciumScan A coronary calcium scan is an imaging test used to look for deposits of calcium and other fatty materials (plaques) in the inner lining of the  blood vessels of the heart (coronary arteries). These deposits of calcium and plaques can partly clog and narrow the coronary arteries without producing any symptoms or warning signs. This puts a person at risk for a heart attack. This test can detect these deposits before symptoms develop. Tell a health care provider about: Any allergies you have. All medicines you are taking, including vitamins, herbs, eye drops, creams, and over-the-counter medicines. Any problems you or family members have had with anesthetic medicines. Any blood disorders you have. Any surgeries you have had. Any medical conditions you have. Whether you are pregnant or may be pregnant. What are the risks? Generally, this is a safe procedure. However, problems may occur, including: Harm to a pregnant woman and her unborn baby. This test involves the use of radiation. Radiation exposure can be dangerous to a pregnant woman and her unborn baby. If you are pregnant, you generally should not have this procedure done. Slight increase in the risk of cancer. This is because of the radiation involved in the test. What happens before the procedure? No preparation is needed for this procedure. What happens during the procedure? You will undress and remove any jewelry around your neck or chest. You will put on a hospital gown. Sticky electrodes will be placed on your chest. The electrodes will be connected to an electrocardiogram (ECG) machine to record a tracing of the electrical activity of your heart. A CT scanner will take pictures of your heart. During this time, you will be asked to lie still and hold your breath for 2-3 seconds while a picture of your heart is being  taken. The procedure may vary among health care providers and hospitals. What happens after the procedure? You can get dressed. You can return to your normal activities. It is up to you to get the results of your test. Ask your health care provider, or the  department that is doing the test, when your results will be ready. Summary A coronary calcium scan is an imaging test used to look for deposits of calcium and other fatty materials (plaques) in the inner lining of the blood vessels of the heart (coronary arteries). Generally, this is a safe procedure. Tell your health care provider if you are pregnant or may be pregnant. No preparation is needed for this procedure. A CT scanner will take pictures of your heart. You can return to your normal activities after the scan is done. This information is not intended to replace advice given to you by your health care provider. Make sure you discuss any questions you have with your health care provider. Document Released: 10/13/2007 Document Revised: 03/05/2016 Document Reviewed: 03/05/2016 Elsevier Interactive Patient Education  2017 [place]: At [facility], you and your health needs are our priority.  As part of our continuing mission to provide you with exceptional heart care, we have created designated Provider Care Teams.  These Care Teams include your primary Cardiologist (physician) and Advanced Practice Providers (APPs -  Physician Assistants and Nurse Practitioners) who all work together to provide you with the care you need, when you need it.  We recommend signing up for the patient portal called "MyChart".  Sign up information is provided on this After Visit Summary.  MyChart is used to connect with patients for Virtual Visits (Telemedicine).  Patients are able to view lab/test results, encounter notes, upcoming appointments, etc.  Non-urgent messages can be sent to your provider as well.   To learn more about what you can do with MyChart, go to [email or web address].    Your next appointment:   6 month(s)  The format for your next appointment:   In Person  Provider:   Oswaldo Milian, MD     Signed, Donato Heinz, MD  12/23/2020 9:51 AM    [place]

## 2020-12-23 ENCOUNTER — Ambulatory Visit: Payer: Medicare PPO | Admitting: Cardiology

## 2020-12-23 ENCOUNTER — Other Ambulatory Visit: Payer: Self-pay

## 2020-12-23 VITALS — BP 110/70 | HR 79 | Ht 64.0 in | Wt 116.4 lb

## 2020-12-23 DIAGNOSIS — I4891 Unspecified atrial fibrillation: Secondary | ICD-10-CM | POA: Diagnosis not present

## 2020-12-23 DIAGNOSIS — E785 Hyperlipidemia, unspecified: Secondary | ICD-10-CM

## 2020-12-23 NOTE — Patient Instructions (Signed)
Medication Instructions:  Your physician recommends that you continue on your current medications as directed. Please refer to the Current Medication list given to you today.  *If you need a refill on your cardiac medications before your next appointment, please call your pharmacy*  Testing/Procedures: CT coronary calcium score. This test is done at [address]. This is $99 out of pocket.   Coronary CalciumScan A coronary calcium scan is an imaging test used to look for deposits of calcium and other fatty materials (plaques) in the inner lining of the blood vessels of the heart (coronary arteries). These deposits of calcium and plaques can partly clog and narrow the coronary arteries without producing any symptoms or warning signs. This puts a person at risk for a heart attack. This test can detect these deposits before symptoms develop. Tell a health care provider about: Any allergies you have. All medicines you are taking, including vitamins, herbs, eye drops, creams, and over-the-counter medicines. Any problems you or family members have had with anesthetic medicines. Any blood disorders you have. Any surgeries you have had. Any medical conditions you have. Whether you are pregnant or may be pregnant. What are the risks? Generally, this is a safe procedure. However, problems may occur, including: Harm to a pregnant woman and her unborn baby. This test involves the use of radiation. Radiation exposure can be dangerous to a pregnant woman and her unborn baby. If you are pregnant, you generally should not have this procedure done. Slight increase in the risk of cancer. This is because of the radiation involved in the test. What happens before the procedure? No preparation is needed for this procedure. What happens during the procedure? You will undress and remove any jewelry around your neck or chest. You will put on a hospital gown. Sticky electrodes will be placed on  your chest. The electrodes will be connected to an electrocardiogram (ECG) machine to record a tracing of the electrical activity of your heart. A CT scanner will take pictures of your heart. During this time, you will be asked to lie still and hold your breath for 2-3 seconds while a picture of your heart is being taken. The procedure may vary among health care providers and hospitals. What happens after the procedure? You can get dressed. You can return to your normal activities. It is up to you to get the results of your test. Ask your health care provider, or the department that is doing the test, when your results will be ready. Summary A coronary calcium scan is an imaging test used to look for deposits of calcium and other fatty materials (plaques) in the inner lining of the blood vessels of the heart (coronary arteries). Generally, this is a safe procedure. Tell your health care provider if you are pregnant or may be pregnant. No preparation is needed for this procedure. A CT scanner will take pictures of your heart. You can return to your normal activities after the scan is done. This information is not intended to replace advice given to you by your health care provider. Make sure you discuss any questions you have with your health care provider. Document Released: 10/13/2007 Document Revised: 03/05/2016 Document Reviewed: 03/05/2016 Elsevier Interactive Patient Education  2017 [place]: At [facility], you and your health needs are our priority.  As part of our continuing mission to provide you with exceptional heart care, we have created designated Provider Care Teams.  These Care Teams include your  primary Cardiologist (physician) and Advanced Practice Providers (APPs -  Physician Assistants and Nurse Practitioners) who all work together to provide you with the care you need, when you need it.  We recommend signing up for the patient portal called "MyChart".  Sign  up information is provided on this After Visit Summary.  MyChart is used to connect with patients for Virtual Visits (Telemedicine).  Patients are able to view lab/test results, encounter notes, upcoming appointments, etc.  Non-urgent messages can be sent to your provider as well.   To learn more about what you can do with MyChart, go to [email or web address].    Your next appointment:   6 month(s)  The format for your next appointment:   In Person  Provider:   Oswaldo Milian, MD

## 2020-12-23 NOTE — Addendum Note (Signed)
Addended by: Patria Mane A on: 12/23/2020 09:56 AM   Modules accepted: Orders

## 2020-12-28 DIAGNOSIS — H43813 Vitreous degeneration, bilateral: Secondary | ICD-10-CM | POA: Diagnosis not present

## 2020-12-28 DIAGNOSIS — H532 Diplopia: Secondary | ICD-10-CM | POA: Diagnosis not present

## 2020-12-28 DIAGNOSIS — H5212 Myopia, left eye: Secondary | ICD-10-CM | POA: Diagnosis not present

## 2020-12-29 NOTE — Addendum Note (Signed)
Addended by: Jacqulynn Cadet on: 12/29/2020 02:39 PM   Modules accepted: Orders

## 2020-12-30 ENCOUNTER — Other Ambulatory Visit: Payer: Self-pay | Admitting: Internal Medicine

## 2020-12-30 NOTE — Telephone Encounter (Signed)
Please address rx request, thank you Sir.

## 2021-01-03 NOTE — Telephone Encounter (Signed)
Please have her schedule a f/u for Nov or Dec

## 2021-01-04 NOTE — Telephone Encounter (Signed)
Patient informed that Lomotil has been sent in and she will call back and get an appointment. She was headed out the door the the dentist.

## 2021-01-09 ENCOUNTER — Ambulatory Visit (INDEPENDENT_AMBULATORY_CARE_PROVIDER_SITE_OTHER)
Admission: RE | Admit: 2021-01-09 | Discharge: 2021-01-09 | Disposition: A | Discharge status: Home / Self Care | Payer: Self-pay | Source: Ambulatory Visit | Attending: Cardiology | Admitting: Cardiology

## 2021-01-09 ENCOUNTER — Other Ambulatory Visit: Payer: Self-pay

## 2021-01-09 DIAGNOSIS — I4891 Unspecified atrial fibrillation: Secondary | ICD-10-CM

## 2021-01-13 ENCOUNTER — Other Ambulatory Visit: Payer: Self-pay | Admitting: *Deleted

## 2021-01-13 DIAGNOSIS — R911 Solitary pulmonary nodule: Secondary | ICD-10-CM

## 2021-01-17 DIAGNOSIS — M0609 Rheumatoid arthritis without rheumatoid factor, multiple sites: Secondary | ICD-10-CM | POA: Diagnosis not present

## 2021-01-17 DIAGNOSIS — Z111 Encounter for screening for respiratory tuberculosis: Secondary | ICD-10-CM | POA: Diagnosis not present

## 2021-01-17 DIAGNOSIS — R5383 Other fatigue: Secondary | ICD-10-CM | POA: Diagnosis not present

## 2021-01-19 ENCOUNTER — Telehealth: Payer: Self-pay | Admitting: Internal Medicine

## 2021-01-19 MED ORDER — FINASTERIDE 5 MG PO TABS: 5.0000 mg | ORAL_TABLET | Freq: Every day | ORAL | 0 refills | Status: DC

## 2021-01-19 MED ORDER — VITAMIN D3 50 MCG (2000 UT) PO CAPS: 2000.0000 [IU] | ORAL_CAPSULE | Freq: Every day | ORAL | 0 refills | Status: DC

## 2021-01-19 NOTE — Telephone Encounter (Signed)
Called [name] to confirm they are changing pharmacies to [facility], and they are.  finasteride (PROSCAR) 5 MG tablet  Vitamin D3 50 MCG (2,000 Unit) Capsule  [facility] [phone] phone [phone] fax

## 2021-01-19 NOTE — Telephone Encounter (Signed)
Refill has been sent to the pharmacy.

## 2021-01-20 DIAGNOSIS — K900 Celiac disease: Secondary | ICD-10-CM | POA: Diagnosis not present

## 2021-01-20 DIAGNOSIS — M1990 Unspecified osteoarthritis, unspecified site: Secondary | ICD-10-CM | POA: Diagnosis not present

## 2021-01-20 DIAGNOSIS — H04129 Dry eye syndrome of unspecified lacrimal gland: Secondary | ICD-10-CM | POA: Diagnosis not present

## 2021-01-20 DIAGNOSIS — G8929 Other chronic pain: Secondary | ICD-10-CM | POA: Diagnosis not present

## 2021-01-20 DIAGNOSIS — K219 Gastro-esophageal reflux disease without esophagitis: Secondary | ICD-10-CM | POA: Diagnosis not present

## 2021-01-20 DIAGNOSIS — I4891 Unspecified atrial fibrillation: Secondary | ICD-10-CM | POA: Diagnosis not present

## 2021-01-20 DIAGNOSIS — D6869 Other thrombophilia: Secondary | ICD-10-CM | POA: Diagnosis not present

## 2021-01-20 DIAGNOSIS — M069 Rheumatoid arthritis, unspecified: Secondary | ICD-10-CM | POA: Diagnosis not present

## 2021-01-20 DIAGNOSIS — K580 Irritable bowel syndrome with diarrhea: Secondary | ICD-10-CM | POA: Diagnosis not present

## 2021-01-23 ENCOUNTER — Other Ambulatory Visit: Payer: Self-pay

## 2021-01-23 NOTE — Telephone Encounter (Signed)
I received a fax from [facility] today requesting generic nexium and the mycolog. According to her med list she is not taking the mycolog. I have called her and left a message to call me back and let me know if she needs the mycolog.

## 2021-01-24 ENCOUNTER — Other Ambulatory Visit: Payer: Self-pay | Admitting: *Deleted

## 2021-01-24 MED ORDER — METOPROLOL SUCCINATE ER 25 MG PO TB24: 25.0000 mg | ORAL_TABLET | Freq: Every day | ORAL | 1 refills | Status: DC

## 2021-01-24 NOTE — Telephone Encounter (Signed)
[name] called in to say that she doesn't need either medicine (generic nexium or mycolog) sent in currently. This must have been a automatic refill request.

## 2021-02-01 ENCOUNTER — Ambulatory Visit: Payer: Medicare PPO | Admitting: Podiatry

## 2021-02-01 ENCOUNTER — Other Ambulatory Visit: Payer: Self-pay

## 2021-02-01 DIAGNOSIS — B351 Tinea unguium: Secondary | ICD-10-CM | POA: Diagnosis not present

## 2021-02-01 DIAGNOSIS — M79674 Pain in right toe(s): Secondary | ICD-10-CM

## 2021-02-01 DIAGNOSIS — M79675 Pain in left toe(s): Secondary | ICD-10-CM

## 2021-02-01 DIAGNOSIS — I7300 Raynaud's syndrome without gangrene: Secondary | ICD-10-CM | POA: Insufficient documentation

## 2021-02-01 DIAGNOSIS — Z111 Encounter for screening for respiratory tuberculosis: Secondary | ICD-10-CM | POA: Insufficient documentation

## 2021-02-01 DIAGNOSIS — R5383 Other fatigue: Secondary | ICD-10-CM | POA: Insufficient documentation

## 2021-02-06 ENCOUNTER — Encounter: Payer: Self-pay | Admitting: Podiatry

## 2021-02-06 NOTE — Progress Notes (Signed)
  Subjective:  Patient ID: [name], female    DOB: [birth year],  MRN: [id number]  [name] presents to clinic today for thick, elongated toenails b/l feet which are tender when wearing enclosed shoe gear.  She voices no new problems on today's visit. Her husband is present during today's visit.  PCP is Baxley, Cresenciano Lick, MD , and last visit was 05/31/2020.  Allergies  Allergen Reactions   Aspirin     Other reaction(s): GI upset   Cox-2 Inhibitors Nausea And Vomiting    Severe abd pain   Glucose Diarrhea    cramps   Lactose Intolerance (Gi) Diarrhea    Stomach cramps   Leflunomide     Other reaction(s): hairloss   Methotrexate     Other reaction(s): hairloss   Oxycodone-Acetaminophen     Other reaction(s): Unknown   Rosuvastatin Diarrhea    Review of Systems: Negative except as noted in the HPI. Objective:   Constitutional [name] is a pleasant 80 y.o. Caucasian female, thin build in NAD. AAO x 3.   Vascular Capillary refill time to digits immediate b/l. Palpable DP pulse(s) b/l lower extremities Palpable PT pulse(s) b/l lower extremities Pedal hair present. Lower extremity skin temperature gradient within normal limits. No pain with calf compression b/l. No edema noted b/l lower extremities. No cyanosis or clubbing noted. Spider veins noted b/l LE.  Neurologic Normal speech. Oriented to person, place, and time. Protective sensation intact 5/5 intact bilaterally with 10g monofilament b/l. Vibratory sensation intact b/l.  Dermatologic Skin warm and supple b/l lower extremities. No open wounds b/l lower extremities. No interdigital macerations b/l lower extremities. Toenails 1-5 b/l elongated, discolored, dystrophic, thickened, crumbly with subungual debris and tenderness to dorsal palpation.  Orthopedic: Normal muscle strength 5/5 to all lower extremity muscle groups bilaterally. Hammertoe(s) noted to the 2-5 bilaterally.   Radiographs: None Assessment:   1.  Pain due to onychomycosis of toenails of both feet    Plan:  Patient was evaluated and treated and all questions answered. Consent given for treatment as described below: -No new findings. No new orders. -Patient to continue soft, supportive shoe gear daily. -Toenails 1-5 b/l were debrided in length and girth with sterile nail nippers and dremel without iatrogenic bleeding.  -Patient to report any pedal injuries to medical professional immediately. -Patient/POA to call should there be question/concern in the interim.  Return in about 3 months (around 05/04/2021).  Marzetta Board, DPM

## 2021-02-07 ENCOUNTER — Telehealth: Payer: Self-pay

## 2021-02-07 NOTE — Telephone Encounter (Signed)
Results have been relayed to the patient. The patient verbalized understanding. No questions at this time.

## 2021-02-07 NOTE — Telephone Encounter (Signed)
Patient is asking if she and her husband should be getting the new variant covid vaccine they have out. They have 4 as of now. Please advise.

## 2021-02-09 ENCOUNTER — Telehealth: Payer: Self-pay | Admitting: Cardiology

## 2021-02-09 MED ORDER — ATORVASTATIN CALCIUM 20 MG PO TABS: 20.0000 mg | ORAL_TABLET | ORAL | 11 refills | Status: DC

## 2021-02-09 NOTE — Telephone Encounter (Signed)
Follow Up:     Patient said she had talked to a nurse on 01-13-21 and told her she could not take Rosuvastatin. She said she was waiting to hear back about what medicine she could take.

## 2021-02-09 NOTE — Telephone Encounter (Signed)
Returned call to patient who called to discuss results of CT calcium score and Dr. Newman Nickels recommendations regarding cholesterol medication. I reviewed results and his advice with her. She verbalized understanding and agreement with plan. She will call back in a few weeks to let us know if she is tolerating atorvastatin. She thanked me for the call.

## 2021-02-20 ENCOUNTER — Other Ambulatory Visit: Payer: Self-pay

## 2021-02-20 ENCOUNTER — Ambulatory Visit (INDEPENDENT_AMBULATORY_CARE_PROVIDER_SITE_OTHER): Payer: Medicare PPO

## 2021-02-20 DIAGNOSIS — Z23 Encounter for immunization: Secondary | ICD-10-CM

## 2021-03-01 ENCOUNTER — Telehealth: Payer: Self-pay | Admitting: Cardiology

## 2021-03-01 MED ORDER — ATORVASTATIN CALCIUM 20 MG PO TABS: 20.0000 mg | ORAL_TABLET | ORAL | 3 refills | Status: DC

## 2021-03-01 NOTE — Telephone Encounter (Signed)
Pt c/o medication issue:  1. Name of Medication:  atorvastatin (LIPITOR) 20 MG tablet  2. How are you currently taking this medication (dosage and times per day)? 1 tablet by mouth three times a week at night   3. Are you having a reaction (difficulty breathing--STAT)? Yes  4. What is your medication issue? Lack of energy. Wanting to know to know how to take all three meds with her Lipitor. Taking metoprolol with supper daily and Eliquis twice a day.

## 2021-03-01 NOTE — Telephone Encounter (Signed)
Returned call to patient of Dr. Gardiner Rhyme   She reports lack of energy since starting atorvastatin on 10/13, which occurred for a few days.  She changed taking this medication to midnight and it is better tolerated She took rosuvastatin in the past -- GI upset  Reviewed that Eliquis needs to be taken every 12 hours -- no bleeding issues   Reviewed that she should monitor BP and pulse since she is taking metoprolol succinate.  Advised she monitor for any bleeding issues on Eliquis.  Advised she call us back if she has any recurrent issues with lack of energy She reports being able to exercise for about an hour with a few breaks

## 2021-03-08 ENCOUNTER — Ambulatory Visit: Payer: Medicare PPO | Admitting: Internal Medicine

## 2021-03-08 ENCOUNTER — Encounter: Payer: Self-pay | Admitting: Internal Medicine

## 2021-03-08 VITALS — BP 130/76 | HR 72 | Ht 64.0 in | Wt 118.0 lb

## 2021-03-08 DIAGNOSIS — K900 Celiac disease: Secondary | ICD-10-CM | POA: Diagnosis not present

## 2021-03-08 DIAGNOSIS — K52832 Lymphocytic colitis: Secondary | ICD-10-CM | POA: Diagnosis not present

## 2021-03-08 DIAGNOSIS — K580 Irritable bowel syndrome with diarrhea: Secondary | ICD-10-CM | POA: Diagnosis not present

## 2021-03-08 NOTE — Progress Notes (Signed)
[name] 80 y.o. Jan 21, 1941 [id number]  Assessment & Plan:   Encounter Diagnoses  Name Primary?   Irritable bowel syndrome with diarrhea Yes   Lymphocytic colitis w/ patchy collagen deposition    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     She has done a great job with diet changes.  She is only on Lomotil using it about 3 times a month when she decides to go off the FODMAPs diet while she is out at [employer] or a party etc.  She is congratulated on her hard work and efforts and I will see her in a year sooner as needed.    Subjective:   Chief Complaint: IBS follow-up  HPI [name] is an 80 year old woman with IBS-D, lymphocytic colitis with patchy collagen deposition and suspected celiac disease as well as lactose intolerance here for follow-up.  She reports that she is doing very well overall using Lomotil about 3 times a month when she eats out and "pays the price".  She says she is willing to do that however.  She is on a FODMAPs diet and is following that and is doing well.  She has incorporated meat back into her regimen as she is not able to eat beans and other vegetarian protein sources due to the need for FODMAPs diet.  She has continued to be gluten-free because of suspected celiac disease.  She is satisfied with her quality of life at this time. Allergies  Allergen Reactions   Aspirin     Other reaction(s): GI upset   Cox-2 Inhibitors Nausea And Vomiting    Severe abd pain   Glucose Diarrhea    cramps   Lactose Intolerance (Gi) Diarrhea    Stomach cramps   Leflunomide     Other reaction(s): hairloss   Methotrexate     Other reaction(s): hairloss   Oxycodone-Acetaminophen     Other reaction(s): Unknown   Rosuvastatin Diarrhea   Current Meds  Medication Sig   acetaminophen (TYLENOL) 500 MG tablet Take 500-1,000 mg by mouth every 8 (eight) hours as needed for moderate pain or headache.    atorvastatin (LIPITOR) 20 MG tablet Take 1 tablet (20 mg total) by mouth 3  (three) times a week.   calcium carbonate (TUMS EX) 750 MG chewable tablet Chew 1,500 mg by mouth 2 (two) times daily.    Cholecalciferol (VITAMIN D3) 50 MCG (2000 UT) capsule Take 1 capsule (2,000 Units total) by mouth daily.   Clobetasol Propionate 0.05 % shampoo Apply 1 application topically daily as needed.   Dermatological Products, Misc. [facility]) lotion See admin instructions.   diphenoxylate-atropine (LOMOTIL) 2.5-0.025 MG tablet TAKE 2 TABLETS BY MOUTH 4 (FOUR) TIMES DAILY AS NEEDED FOR DIARRHEA OR LOOSE STOOLS. MAXIMUM 8 TABLETS DAILY   Doxycycline Hyclate 50 MG TABS    ELIQUIS 2.5 MG TABS tablet TAKE 1 TABLET BY MOUTH TWICE A DAY   esomeprazole (NEXIUM) 40 MG capsule TAKE 1 CAPSULE(40 MG) BY MOUTH DAILY BEFORE A MEAL   finasteride (PROSCAR) 5 MG tablet Take 1 tablet (5 mg total) by mouth daily.   gabapentin (NEURONTIN) 300 MG capsule TAKE 1 CAPSULE BY MOUTH AT BEDTIME AS NEEDED FOR LEG CRAMPS   hydrocortisone cream 1 % Apply 1 application topically daily as needed for itching (burns).   InFLIXimab (REMICADE IV) Inject 1 Dose into the vein See admin instructions. Remicade Infusion every 2 months   Ivermectin 1 % CREA 1 application to affected area   lactase (LACTAID) 3000 units tablet 1  tablet   loratadine (CLARITIN) 10 MG tablet 1 tablet   metoprolol succinate (TOPROL XL) 25 MG 24 hr tablet Take 1 tablet (25 mg total) by mouth daily.   minoxidil (ROGAINE) 2 % external solution Apply 1 application topically 2 (two) times daily.   Multiple Vitamins-Minerals (PRESERVISION AREDS 2) CAPS Take 1 capsule by mouth 2 (two) times daily.   neomycin-bacitracin-polymyxin (NEOSPORIN) ointment Apply 1 application topically as needed for wound care.   nystatin-triamcinolone ointment (MYCOLOG) Apply 1 application topically 2 (two) times daily.   Polyethyl Glycol-Propyl Glycol (SYSTANE) 0.4-0.3 % GEL ophthalmic gel Place 1 application into both eyes at bedtime.   pseudoephedrine (SUDAFED) 30 MG  tablet 1 tablet as needed   SOOLANTRA 1 % CREA Apply 1 application topically daily.    Past Medical History:  Diagnosis Date   Allergy    seasonal   Anemia    Cataract    bil cateracts removed   Chronic constipation    Diarrhea    chronic diarrheafor over 2 months   GERD (gastroesophageal reflux disease)    Hyperlipidemia    Lymphocytic colitis w/ patchy collagen deposition 09/28/2016   Rheumatoid arthritis (HCC)    Status post dilation of esophageal narrowing 2014   Wears glasses    Past Surgical History:  Procedure Laterality Date   CARDIOVERSION N/A 05/11/2019   Procedure: CARDIOVERSION;  Surgeon: Buford Dresser, MD;  Location: [facility] ENDOSCOPY;  Service: Cardiovascular;  Laterality: N/A;   CATARACT EXTRACTION  06/30/2016 -right eye   left eye in 08/15/16   COLONOSCOPY     DILATION AND CURETTAGE OF UTERUS  2009   ESOPHAGEAL DILATION  05/31/1997   REPAIR EXTENSOR TENDON Right 05/14/2013   Procedure: LONG FINGER EXTENSOR TO ELEVATE RING AND SMALL FINGERS ;  Surgeon: Cammie Sickle., MD;  Location: [place];  Service: Orthopedics;  Laterality: Right;   seborrheic keratosis  09/03/12   inflamed, excised from forehead   TENDON TRANSFER Right 09/03/2013   Procedure: RIGHT HAND FLEXOR CARPI RADIALUS TRANSFER TO LONG/RING/SMALL;  Surgeon: Cammie Sickle., MD;  Location: [place];  Service: Orthopedics;  Laterality: Right;   TONSILLECTOMY     TUBAL LIGATION     ULNAR HEAD EXCISION Right 05/14/2013   Procedure: ARTHOPLASTY DISTAL RADIAL ULNAR JOINT, RIGHT ;  Surgeon: Cammie Sickle., MD;  Location: [place];  Service: Orthopedics;  Laterality: Right;   UPPER GASTROINTESTINAL ENDOSCOPY     Social History   Social History Narrative   Health Care POA: Husband, [name]   Emergency Contact: [name]   End of Life Plan:    Who lives with you: husband   Son, in [place]   Any pets: 3 cats   Diet: Pt has a varied diet of protein,  starch, and vegetables.   Exercise: Pt works with trainer 2x a week for 60 minutes   Seatbelts: Pt reports wearing seatbelt when in vehicle.   Sun Exposure/Protection: Pt reports not using sun protection.   Hobbies: gardening, reading, concerts               family history includes COPD in her father; Colon cancer in her paternal uncle; Heart disease in her mother; Osteoporosis in her maternal aunt, maternal grandmother, and mother.   Review of Systems As per HPI  Objective:   Physical Exam BP 130/76   Pulse 72   Ht 5' 4"  (1.626 m)   Wt 118 lb (53.5 kg)  BMI 20.25 kg/m

## 2021-03-08 NOTE — Patient Instructions (Signed)
Glad the FODMAP is working well for you. Keep up the good work.   Come back and see [place] in a year or sooner if needed.   I appreciate the opportunity to care for you. Silvano Rusk, MD, [facility]

## 2021-03-10 DIAGNOSIS — L718 Other rosacea: Secondary | ICD-10-CM | POA: Diagnosis not present

## 2021-03-10 DIAGNOSIS — L649 Androgenic alopecia, unspecified: Secondary | ICD-10-CM | POA: Diagnosis not present

## 2021-03-10 DIAGNOSIS — L218 Other seborrheic dermatitis: Secondary | ICD-10-CM | POA: Diagnosis not present

## 2021-03-14 ENCOUNTER — Telehealth: Payer: Self-pay | Admitting: Cardiology

## 2021-03-14 MED ORDER — ATORVASTATIN CALCIUM 20 MG PO TABS: 20.0000 mg | ORAL_TABLET | ORAL | 3 refills | Status: DC

## 2021-03-14 NOTE — Telephone Encounter (Signed)
*  STAT* If patient is at the pharmacy, call can be transferred to refill team.   1. Which medications need to be refilled? (please list name of each medication and dose if known) atorvastatin (LIPITOR) 20 MG tablet  2. Which pharmacy/location (including street and city if local pharmacy) is medication to be sent to? [id number]- [place], [place] - [address]  3. Do they need a 30 day or 90 day supply? 90   Patient ask the allergie hold be taking off. Patient is out of medication.

## 2021-03-14 NOTE — Telephone Encounter (Signed)
Veronica see below note

## 2021-03-14 NOTE — Telephone Encounter (Signed)
Spoke with patient who need refill of atorvastatin. She has been trying to get from mail order [facility] with no success. Explained this was refilled on 11/2 to mail order pharmacy.   Advised will send Rx to local pharmacy per request.   Advised will call [facility] to find out what is going on with Rx -- spoke with pharm tech and was notified med was on hold b/c she has an active allergy for rosuvastatin and they needed clarification if OK to fill another statin (atorvastatin). Explained OK to fill, as patient is tolerating.   This was relayed to patient

## 2021-03-15 DIAGNOSIS — Z79899 Other long term (current) drug therapy: Secondary | ICD-10-CM | POA: Diagnosis not present

## 2021-03-15 DIAGNOSIS — M0609 Rheumatoid arthritis without rheumatoid factor, multiple sites: Secondary | ICD-10-CM | POA: Diagnosis not present

## 2021-03-17 ENCOUNTER — Other Ambulatory Visit: Payer: Self-pay

## 2021-03-17 ENCOUNTER — Other Ambulatory Visit: Payer: Medicare PPO | Admitting: Internal Medicine

## 2021-03-17 DIAGNOSIS — Z0000 Encounter for general adult medical examination without abnormal findings: Secondary | ICD-10-CM

## 2021-03-17 DIAGNOSIS — I4819 Other persistent atrial fibrillation: Secondary | ICD-10-CM | POA: Diagnosis not present

## 2021-03-17 DIAGNOSIS — Z1329 Encounter for screening for other suspected endocrine disorder: Secondary | ICD-10-CM

## 2021-03-17 DIAGNOSIS — E7800 Pure hypercholesterolemia, unspecified: Secondary | ICD-10-CM

## 2021-03-18 ENCOUNTER — Other Ambulatory Visit: Payer: Self-pay | Admitting: Cardiology

## 2021-03-18 LAB — CBC WITH DIFFERENTIAL/PLATELET
Absolute Monocytes: 374 cells/uL (ref 200–950)
Basophils Absolute: 29 cells/uL (ref 0–200)
Basophils Relative: 0.6 %
Eosinophils Absolute: 312 cells/uL (ref 15–500)
Eosinophils Relative: 6.5 %
HCT: 39.7 % (ref 35.0–45.0)
Hemoglobin: 13.5 g/dL (ref 11.7–15.5)
Lymphs Abs: 1843 cells/uL (ref 850–3900)
MCH: 33.8 pg — ABNORMAL HIGH (ref 27.0–33.0)
MCHC: 34 g/dL (ref 32.0–36.0)
MCV: 99.5 fL (ref 80.0–100.0)
MPV: 9.6 fL (ref 7.5–12.5)
Monocytes Relative: 7.8 %
Neutro Abs: 2242 cells/uL (ref 1500–7800)
Neutrophils Relative %: 46.7 %
Platelets: 188 10*3/uL (ref 140–400)
RBC: 3.99 10*6/uL (ref 3.80–5.10)
RDW: 12.4 % (ref 11.0–15.0)
Total Lymphocyte: 38.4 %
WBC: 4.8 10*3/uL (ref 3.8–10.8)

## 2021-03-18 LAB — COMPLETE METABOLIC PANEL WITH GFR
AG Ratio: 1.4 (calc) (ref 1.0–2.5)
ALT: 16 U/L (ref 6–29)
AST: 19 U/L (ref 10–35)
Albumin: 4.1 g/dL (ref 3.6–5.1)
Alkaline phosphatase (APISO): 55 U/L (ref 37–153)
BUN: 14 mg/dL (ref 7–25)
CO2: 26 mmol/L (ref 20–32)
Calcium: 9.4 mg/dL (ref 8.6–10.4)
Chloride: 104 mmol/L (ref 98–110)
Creat: 0.69 mg/dL (ref 0.60–0.95)
Globulin: 3 g/dL (calc) (ref 1.9–3.7)
Glucose, Bld: 78 mg/dL (ref 65–99)
Potassium: 4.4 mmol/L (ref 3.5–5.3)
Sodium: 141 mmol/L (ref 135–146)
Total Bilirubin: 0.7 mg/dL (ref 0.2–1.2)
Total Protein: 7.1 g/dL (ref 6.1–8.1)
eGFR: 87 mL/min/{1.73_m2} (ref >=60)

## 2021-03-18 LAB — LIPID PANEL
Cholesterol: 214 mg/dL — ABNORMAL HIGH (ref <200)
HDL: 122 mg/dL (ref >=50)
LDL Cholesterol (Calc): 77 mg/dL (calc)
Non-HDL Cholesterol (Calc): 92 mg/dL (calc) (ref <130)
Total CHOL/HDL Ratio: 1.8 (calc) (ref <5.0)
Triglycerides: 66 mg/dL (ref <150)

## 2021-03-18 LAB — TSH: TSH: 2.18 mIU/L (ref 0.40–4.50)

## 2021-03-20 ENCOUNTER — Ambulatory Visit (INDEPENDENT_AMBULATORY_CARE_PROVIDER_SITE_OTHER): Payer: Medicare PPO | Admitting: Internal Medicine

## 2021-03-20 ENCOUNTER — Other Ambulatory Visit: Payer: Self-pay

## 2021-03-20 ENCOUNTER — Encounter: Payer: Self-pay | Admitting: Internal Medicine

## 2021-03-20 VITALS — BP 102/70 | HR 69 | Temp 98.6°F | Ht 63.25 in | Wt 117.0 lb

## 2021-03-20 DIAGNOSIS — M8580 Other specified disorders of bone density and structure, unspecified site: Secondary | ICD-10-CM

## 2021-03-20 DIAGNOSIS — Z0000 Encounter for general adult medical examination without abnormal findings: Secondary | ICD-10-CM

## 2021-03-20 DIAGNOSIS — I4819 Other persistent atrial fibrillation: Secondary | ICD-10-CM | POA: Diagnosis not present

## 2021-03-20 DIAGNOSIS — K52832 Lymphocytic colitis: Secondary | ICD-10-CM

## 2021-03-20 DIAGNOSIS — H903 Sensorineural hearing loss, bilateral: Secondary | ICD-10-CM | POA: Diagnosis not present

## 2021-03-20 DIAGNOSIS — E7800 Pure hypercholesterolemia, unspecified: Secondary | ICD-10-CM | POA: Diagnosis not present

## 2021-03-20 DIAGNOSIS — Z7901 Long term (current) use of anticoagulants: Secondary | ICD-10-CM | POA: Diagnosis not present

## 2021-03-20 DIAGNOSIS — R7989 Other specified abnormal findings of blood chemistry: Secondary | ICD-10-CM | POA: Diagnosis not present

## 2021-03-20 LAB — POCT URINALYSIS DIPSTICK
Bilirubin, UA: NEGATIVE
Blood, UA: NEGATIVE
Glucose, UA: NEGATIVE
Ketones, UA: NEGATIVE
Leukocytes, UA: NEGATIVE
Nitrite, UA: NEGATIVE
Protein, UA: NEGATIVE
Spec Grav, UA: 1.01 (ref 1.010–1.025)
Urobilinogen, UA: 0.2 E.U./dL
pH, UA: 5 (ref 5.0–8.0)

## 2021-03-20 MED ORDER — METOPROLOL SUCCINATE ER 25 MG PO TB24: 25.0000 mg | ORAL_TABLET | Freq: Every day | ORAL | 1 refills | Status: DC

## 2021-03-20 NOTE — Telephone Encounter (Signed)
Prescription refill request for Eliquis received. Indication:Afib Last office visit:8/22 Scr:0.6 Age: 80 Weight:53.5 kg  Prescription refilled

## 2021-03-20 NOTE — Progress Notes (Signed)
Annual Wellness Visit     Patient: [name], Female    DOB: 10-Oct-1940, 80 y.o.   MRN: [id number] Visit Date: 03/20/2021  Chief Complaint  Patient presents with   Medicare Wellness   Subjective    [name] is a 80 y.o. female who presents today for her Annual Wellness Visit.  HPI She also presents today for health maintenance exam and evaluation of medical issues. History of Rheumatoid Arthritis treated with IV Remicade by [facility] Rheumat[facility].  History of irritable bowel syndrome and lymphocytic colitis seen by Dr. Carlean Purl.  History of GE reflux.  This seems to be under better control recently.  History of hearing loss and tinnitus.  Was diagnosed with new onset atrial fibrillation in November 2020.  Had cardioversion in January 2021 and subsequently went back into atrial.  Rate was controlled.  Echocardiogram in December 2020 indicated she had left atrial size severely dilated and right atrial size mildly dilated.  Chads 2 score was 30.  She is on Eliquis.  She and her husband decided they would pursue rate control.  In January 2015 she had surgery by Dr. 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  Remote history of plantar fasciitis.  History of mild osteopenia based on bone density study.  Had mammogram April 2022 with [facility] which was normal.  Social history: She has a Education officer, community.  1 adult son who lives in [place].  She is married to Shelly Flatten, well-known to Probation officer and  poet.  Social alcohol consumption.  Does not smoke.  Family history: Father died at age 39 with complications of COPD.  Mother died at age 62 of heart failure.  No brothers or sisters.  Last bone density study at [facility] was 2016. Copy is in Epic. Defer to [facility]  Rheumatology.  History of environmental allergies.    Patient Care Team: [name], MD as PCP - General (Internal Medicine) Stefanie Libel, MD (Family Medicine) Teena Irani, MD (Inactive) (Gastroenterology) Shon Hough, MD (Ophthalmology) Everett Graff, MD (Obstetrics and Gynecology) Lindwood Coke, MD (Dermatology) Earley Favor (Dental General Practice)  Review of Systems no new complaints. Medical issues are fairly stable but still has diarrhea often.   Objective    Vitals: BP 102/70   Pulse 69   Temp 98.6 F (37 C) (Tympanic)   Ht 5' 3.25" (1.607 m)   Wt 117 lb (53.1 kg)   SpO2 99%   BMI 20.56 kg/m   Physical Exam Skin: Warm and dry.  TMs clear.  No thyromegaly.  No carotid bruits.  Chest clear to auscultation.  Cardiac exam: Irregular irregular rhythm consistent with atrial fibrillation.  Abdomen is soft nondistended without hepatosplenomegaly masses or tenderness.  No lower extremity pitting edema.  Neuro is intact without focal deficits. Affect ,thought, and judgement are normal.   Most recent functional status assessment: In your present state of health, do you have any difficulty performing the following activities: 03/20/2021  Hearing? N  Vision? N  Difficulty concentrating or making decisions? N  Walking or climbing stairs? N  Dressing or bathing? N  Doing errands, shopping? N  Some recent data might be hidden   Most recent fall risk assessment: Fall Risk  03/20/2021  Falls in the past year? 1  Number falls in  past yr: 0  Injury with Fall? 0  Risk for fall due to : History of fall(s)  Follow up Falls evaluation completed    Most recent depression screenings: PHQ 2/9 Scores 03/20/2021 03/17/2020  PHQ - 2 Score 0 0  Exception Documentation - -   Most recent cognitive screening: Deferred 6CIT Screen 03/20/2021  What Year? 0 points  What month? 0 points  What time? 0 points  Count back from 20 0 points  Months in reverse 0 points  Repeat  phrase 0 points  Total Score 0       Assessment & Plan     Annual wellness visit done today including the all of the following: Reviewed patient's Family Medical History Reviewed and updated list of patient's medical providers Assessment of cognitive impairment was done Assessed patient's functional ability Established a written schedule for health screening [place] Completed and Reviewed  Discussed health benefits of physical activity, and encouraged her to engage in regular exercise appropriate for her age and condition.    Rheumatoid arthritis-seen by [facility] Rheumatology and treated with Remicade  Atrial fibrillation-chronic treated with Eliquis and followed by cardiology.  Is on metoprolol XL 25 mg daily.  Lymphocytic colitis/irritable bowel syndrome treated by Dr. Carlean Purl  HDL cholesterol  GE reflux treated with Nexium with Dr. Carlean Purl  Sensorineural hearing loss  Takes Proscar for hair loss  History of elevated LDL cholesterol.  Tried on Crestor however it made diarrhea worse and was discontinued.  Subsequently started on Lipitor 20 mg 3 times a week by Dr. Nechama Guard in November 20 22-3 times a week.  Plan: Continue current medications and return in 1 year or as needed.Have mammogram and bone density.   Recent CT coronary calcium score 121  which is 50th percentile. Is on Atorvastatin 3 times a week.  Recommend Covid booster this Fall.Has had flu vaccine.     IElby Showers, MD, have reviewed all documentation for this visit. The documentation on 03/21/21 for the exam, diagnosis, procedures, and orders are all accurate and complete.    Angus Seller, CMA

## 2021-03-20 NOTE — Progress Notes (Deleted)
   Subjective:    Patient ID: [name], female    DOB: [birth year], 80 y.o.   MRN: [id number]  HPI    Review of Systems     Objective:   Physical Exam        Assessment & Plan:

## 2021-03-27 ENCOUNTER — Other Ambulatory Visit: Payer: Self-pay

## 2021-03-27 DIAGNOSIS — M8580 Other specified disorders of bone density and structure, unspecified site: Secondary | ICD-10-CM

## 2021-04-04 ENCOUNTER — Other Ambulatory Visit: Payer: Self-pay | Admitting: Internal Medicine

## 2021-04-25 ENCOUNTER — Other Ambulatory Visit: Payer: Self-pay

## 2021-04-25 MED ORDER — ESOMEPRAZOLE MAGNESIUM 40 MG PO CPDR: DELAYED_RELEASE_CAPSULE | ORAL | 3 refills | Status: DC

## 2021-05-10 DIAGNOSIS — M0609 Rheumatoid arthritis without rheumatoid factor, multiple sites: Secondary | ICD-10-CM | POA: Diagnosis not present

## 2021-05-16 DIAGNOSIS — M0609 Rheumatoid arthritis without rheumatoid factor, multiple sites: Secondary | ICD-10-CM | POA: Diagnosis not present

## 2021-05-16 DIAGNOSIS — M25572 Pain in left ankle and joints of left foot: Secondary | ICD-10-CM | POA: Diagnosis not present

## 2021-05-16 DIAGNOSIS — Z6820 Body mass index (BMI) 20.0-20.9, adult: Secondary | ICD-10-CM | POA: Diagnosis not present

## 2021-05-16 DIAGNOSIS — M150 Primary generalized (osteo)arthritis: Secondary | ICD-10-CM | POA: Diagnosis not present

## 2021-05-16 DIAGNOSIS — M7989 Other specified soft tissue disorders: Secondary | ICD-10-CM | POA: Diagnosis not present

## 2021-05-16 DIAGNOSIS — I7300 Raynaud's syndrome without gangrene: Secondary | ICD-10-CM | POA: Diagnosis not present

## 2021-05-19 ENCOUNTER — Other Ambulatory Visit: Payer: Self-pay

## 2021-05-19 ENCOUNTER — Ambulatory Visit: Payer: Medicare PPO | Admitting: Podiatry

## 2021-05-19 DIAGNOSIS — M79674 Pain in right toe(s): Secondary | ICD-10-CM | POA: Diagnosis not present

## 2021-05-19 DIAGNOSIS — B351 Tinea unguium: Secondary | ICD-10-CM

## 2021-05-19 DIAGNOSIS — M79675 Pain in left toe(s): Secondary | ICD-10-CM | POA: Diagnosis not present

## 2021-05-26 ENCOUNTER — Encounter: Payer: Self-pay | Admitting: Podiatry

## 2021-05-26 NOTE — Progress Notes (Signed)
°  Subjective:  Patient ID: AMIAYAH GIEBEL, female    DOB: [birth year],  MRN: [id number]  81 y.o. female presents painful thick toenails that are difficult to trim. Pain interferes with ambulation. Aggravating factors include wearing enclosed shoe gear. Pain is relieved with periodic professional debridement.  New problem(s): None   PCP is Baxley, Cresenciano Lick, MD , and last visit was 03/20/2021.  Allergies  Allergen Reactions   Aspirin     Other reaction(s): GI upset   Cox-2 Inhibitors Nausea And Vomiting    Severe abd pain   Glucose Diarrhea    cramps   Lactose Intolerance (Gi) Diarrhea    Stomach cramps   Leflunomide     Other reaction(s): hairloss   Methotrexate     Other reaction(s): hairloss   Oxycodone-Acetaminophen     Other reaction(s): Unknown   Rosuvastatin Diarrhea    Review of Systems: Negative except as noted in the HPI.   Objective:  Vascular Examination: Vascular status intact b/l with palpable pedal pulses. CFT immediate b/l. No edema. No pain with calf compression b/l. Skin temperature gradient WNL b/l.   Neurological Examination: Sensation grossly intact b/l with 10 gram monofilament. Vibratory sensation intact b/l.   Dermatological Examination: Pedal skin with normal turgor, texture and tone b/l. Toenails 1-5 b/l thick, discolored, elongated with subungual debris and pain on dorsal palpation. No hyperkeratotic lesions noted b/l.   Musculoskeletal Examination: Muscle strength 5/5 to b/l LE. Hammertoe deformity noted 2-5 b/l.  Radiographs: None  Last A1c:  No flowsheet data found.   Assessment:   1. Pain due to onychomycosis of toenails of both feet    Plan:  -Examined patient. -Mycotic toenails 1-5 bilaterally were debrided in length and girth with sterile nail nippers and dremel without incident. -Patient/POA to call should there be question/concern in the interim.  Return in about 3 months (around 08/17/2021).  Marzetta Board, DPM

## 2021-06-01 ENCOUNTER — Encounter: Payer: Self-pay | Admitting: Internal Medicine

## 2021-06-01 DIAGNOSIS — L309 Dermatitis, unspecified: Secondary | ICD-10-CM | POA: Diagnosis not present

## 2021-06-01 DIAGNOSIS — H04129 Dry eye syndrome of unspecified lacrimal gland: Secondary | ICD-10-CM | POA: Diagnosis not present

## 2021-06-01 DIAGNOSIS — D6869 Other thrombophilia: Secondary | ICD-10-CM | POA: Diagnosis not present

## 2021-06-01 DIAGNOSIS — I10 Essential (primary) hypertension: Secondary | ICD-10-CM | POA: Diagnosis not present

## 2021-06-01 DIAGNOSIS — E785 Hyperlipidemia, unspecified: Secondary | ICD-10-CM | POA: Diagnosis not present

## 2021-06-01 DIAGNOSIS — K219 Gastro-esophageal reflux disease without esophagitis: Secondary | ICD-10-CM | POA: Diagnosis not present

## 2021-06-01 DIAGNOSIS — L659 Nonscarring hair loss, unspecified: Secondary | ICD-10-CM | POA: Diagnosis not present

## 2021-06-01 DIAGNOSIS — I4891 Unspecified atrial fibrillation: Secondary | ICD-10-CM | POA: Diagnosis not present

## 2021-06-01 DIAGNOSIS — M069 Rheumatoid arthritis, unspecified: Secondary | ICD-10-CM | POA: Diagnosis not present

## 2021-06-02 DIAGNOSIS — H6123 Impacted cerumen, bilateral: Secondary | ICD-10-CM | POA: Insufficient documentation

## 2021-06-02 DIAGNOSIS — H903 Sensorineural hearing loss, bilateral: Secondary | ICD-10-CM | POA: Diagnosis not present

## 2021-06-02 DIAGNOSIS — H9113 Presbycusis, bilateral: Secondary | ICD-10-CM | POA: Insufficient documentation

## 2021-06-02 DIAGNOSIS — H938X3 Other specified disorders of ear, bilateral: Secondary | ICD-10-CM | POA: Diagnosis not present

## 2021-06-22 ENCOUNTER — Other Ambulatory Visit: Payer: Self-pay

## 2021-06-22 MED ORDER — APIXABAN 2.5 MG PO TABS: 2.5000 mg | ORAL_TABLET | Freq: Two times a day (BID) | ORAL | 1 refills | Status: DC

## 2021-07-06 ENCOUNTER — Ambulatory Visit: Payer: Medicare PPO | Admitting: Cardiology

## 2021-07-06 DIAGNOSIS — M0609 Rheumatoid arthritis without rheumatoid factor, multiple sites: Secondary | ICD-10-CM | POA: Diagnosis not present

## 2021-07-14 ENCOUNTER — Other Ambulatory Visit: Payer: Self-pay | Admitting: Internal Medicine

## 2021-07-18 NOTE — Progress Notes (Deleted)
?Cardiology Office Note:   ? ?Date:  07/18/2021  ? ?ID:  ELSI James, DOB 30-Sep-1940, MRN [id number] ? ?PCP:  [name], MD  ?Cardiologist:  None  ?Electrophysiologist:  None  ? ?Referring MD: [name], MD  ? ?No chief complaint on file. ? ? ? ?History of Present Illness:   ? ?[name] is a 81 y.o. female with a hx of  hyperlipidemia, rheumatoid arthritis, lymphocytic colitis, GERD who presents for follow-up.  She was initially seen on 04/03/2019, had been referred by Dr. Renold Genta for evaluation of new onset atrial fibrillation.  At initial clinic visit, she was noted to be in AF with rates 110s, was started on metoprolol 25 mg twice daily for rate control.  She was started on Eliquis 2.5 mg twice daily for anticoagulation.  TTE on 04/30/2019 showed EF 60 to 65%, normal RV function, severe left atrial dilatation, mild right atrial dilatation, mild MR, mild AI.  On 05/11/2019, she underwent a successful DCCV to restore sinus rhythm.  Following cardioversion, she reported that her heart rate would fall to the 40s, metoprolol dose was decreased to 12.5 mg twice daily.  At follow-up clinic on 07/03/2019, she was noted to be back in atrial fibrillation.  She was referred to the A. fib clinic, and rhythm control options were discussed but patient elected to continue with rate control strategy.  Zio patch x3 days on 12/02/2019 showed 100s in AF burden with average rate 78 bpm.  Calcium score on 01/09/2021 was 121 (50th percentile). ? ?Since her last clinic visit,  ?she reports that she is doing well.  Denies any chest pain, dyspnea, lightheadedness, syncope, or palpitations.  Feels very tired in afternoon.  Often drinks 2 glasses of wine at lunch.  Drinks about 10 glasses of wine per week.  Does have chronic left ankle swelling.  Has been working out 3 times per week with a trainer, workouts last for about an hour.  She is taking Eliquis, denies any bleeding issues. ? ? ?Wt Readings from Last 3 Encounters:   ?03/20/21 117 lb (53.1 kg)  ?03/08/21 118 lb (53.5 kg)  ?12/23/20 116 lb 6.4 oz (52.8 kg)  ? ? ? ?Past Medical History:  ?Diagnosis Date  ? A-fib ([place])   ? Allergy   ? seasonal  ? Anemia   ? Cataract   ? bil cateracts removed  ? Chronic constipation   ? Diarrhea   ? chronic diarrheafor over 2 months  ? GERD (gastroesophageal reflux disease)   ? Hyperlipidemia   ? Lymphocytic colitis w/ patchy collagen deposition 09/28/2016  ? Rheumatoid arthritis ([place])   ? Status post dilation of esophageal narrowing 2014  ? Wears glasses   ? ? ?Past Surgical History:  ?Procedure Laterality Date  ? CARDIOVERSION N/A 05/11/2019  ? Procedure: CARDIOVERSION;  Surgeon: Buford Dresser, MD;  Location: [facility];  Service: Cardiovascular;  Laterality: N/A;  ? CATARACT EXTRACTION  06/30/2016 -right eye  ? left eye in 08/15/16  ? COLONOSCOPY    ? DILATION AND CURETTAGE OF UTERUS  2009  ? ESOPHAGEAL DILATION  05/31/1997  ? REPAIR EXTENSOR TENDON Right 05/14/2013  ? Procedure: LONG FINGER EXTENSOR TO ELEVATE RING AND SMALL FINGERS ;  Surgeon: Cammie Sickle., MD;  Location: [place];  Service: Orthopedics;  Laterality: Right;  ? seborrheic keratosis  09/03/12  ? inflamed, excised from forehead  ? TENDON TRANSFER Right 09/03/2013  ? Procedure: RIGHT HAND FLEXOR CARPI RADIALUS  TRANSFER TO LONG/RING/SMALL;  Surgeon: Cammie Sickle., MD;  Location: [place];  Service: Orthopedics;  Laterality: Right;  ? TONSILLECTOMY    ? TUBAL LIGATION    ? ULNAR HEAD EXCISION Right 05/14/2013  ? Procedure: ARTHOPLASTY DISTAL RADIAL ULNAR JOINT, RIGHT ;  Surgeon: Cammie Sickle., MD;  Location: [place];  Service: Orthopedics;  Laterality: Right;  ? UPPER GASTROINTESTINAL ENDOSCOPY    ? ? ?Current Medications: ?No outpatient medications have been marked as taking for the 07/19/21 encounter (Appointment) with Donato Heinz, MD.  ?  ? ?Allergies:   Aspirin, Cox-2 inhibitors, Glucose, Lactose  intolerance (gi), Leflunomide, Methotrexate, Oxycodone-acetaminophen, and Rosuvastatin  ? ?Social History  ? ?Socioeconomic History  ? Marital status: Married  ?  Spouse name: [name]  ? Number of children: 1  ? Years of education: 49  ? Highest education level: Not on file  ?Occupational History  ? Occupation: RetiredBiomedical scientist  ?  Employer: RETIRED  ?Tobacco Use  ? Smoking status: Never  ? Smokeless tobacco: Never  ?Vaping Use  ? Vaping Use: Never used  ?Substance and Sexual Activity  ? Alcohol use: Yes  ?  Alcohol/week: 21.0 standard drinks  ?  Types: 21 Standard drinks or equivalent per week  ?  Comment: 3 times weekly  ? Drug use: No  ? Sexual activity: Not on file  ?Other Topics Concern  ? Not on file  ?Social History Narrative  ? Health Care POA: Husband, [name]  ? Emergency Contact: [name]  ? End of Life Plan:   ? Who lives with you: husband  ? Son, in [place]  ? Any pets: 3 cats  ? Diet: Pt has a varied diet of protein, starch, and vegetables.  ? Exercise: Pt works with trainer 2x a week for 60 minutes  ? Seatbelts: Pt reports wearing seatbelt when in vehicle.  ? Sun Exposure/Protection: Pt reports not using sun protection.  ? Hobbies: gardening, reading, concerts  ?   ?   ?   ?   ? ?Social Determinants of Health  ? ?Financial Resource Strain: Not on file  ?Food Insecurity: Not on file  ?Transportation Needs: Not on file  ?Physical Activity: Not on file  ?Stress: Not on file  ?Social Connections: Not on file  ?  ? ?Family History: ?The patient's family history includes COPD in her father; Colon cancer in her paternal uncle; Heart disease in her mother; Osteoporosis in her maternal aunt, maternal grandmother, and mother. There is no history of Esophageal cancer, Pancreatic cancer, Rectal cancer, or Stomach cancer. ? ?ROS:   ?Please see the history of present illness.    ?A whole 8 I get asked the minimum of once a day bowel dialysis I assessment all other systems reviewed and are negative. ? ?EKGs/Labs/Other  Studies Reviewed:   ? ?The following studies were reviewed today: ? ? ?EKG:  EKG is  ordered today.  The ekg ordered today demonstrates atrial fibrillation, rate 71 ? ?Recent Labs: ?03/17/2021: ALT 16; BUN 14; Creat 0.69; Hemoglobin 13.5; Platelets 188; Potassium 4.4; Sodium 141; TSH 2.18  ?Recent Lipid Panel ?   ?Component Value Date/Time  ? CHOL 214 (H) 03/17/2021 0912  ? TRIG 66 03/17/2021 0912  ? HDL 122 03/17/2021 0912  ? CHOLHDL 1.8 03/17/2021 0912  ? VLDL 19 08/03/2016 1001  ? [place] 77 03/17/2021 0912  ? LDLDIRECT 149 (H) 11/11/2007 2043  ? ?TTE 04/30/19: ?1. Left ventricular ejection fraction, by  visual estimation, is 60 to  ?65%. The left ventricle has normal function. Left ventricular septal wall  ?thickness was normal. Normal left ventricular posterior wall thickness.  ?There is no left ventricular  ?hypertrophy.  ? 2. Left ventricular diastolic parameters are indeterminate.  ? 3. The left ventricle has no regional wall motion abnormalities.  ? 4. Global right ventricle has normal systolic function.The right  ?ventricular size is normal. No increase in right ventricular wall  ?thickness.  ? 5. Left atrial size was severely dilated.  ? 6. Right atrial size was mildly dilated.  ? 7. The mitral valve is normal in structure. Mild mitral valve  ?regurgitation. No evidence of mitral stenosis.  ? 8. The tricuspid valve is normal in structure.  ? 9. The aortic valve is tricuspid. Aortic valve regurgitation is mild. No  ?evidence of aortic valve sclerosis or stenosis.  ?10. The pulmonic valve was normal in structure. Pulmonic valve  ?regurgitation is not visualized.  ?11. Normal pulmonary artery systolic pressure.  ?12. The inferior vena cava is dilated in size with >50% respiratory  ?variability, suggesting right atrial pressure of 8 mmHg.  ? ?Physical Exam:   ? ?VS:  There were no vitals taken for this visit.   ? ?Wt Readings from Last 3 Encounters:  ?03/20/21 117 lb (53.1 kg)  ?03/08/21 118 lb (53.5 kg)   ?12/23/20 116 lb 6.4 oz (52.8 kg)  ?  ? ?GEN: in no acute distress ?HEENT: Normal ?NECK: No JVD ?CARDIAC: irregular,  no murmurs, rubs, gallops ?RESPIRATORY:  Clear to auscultation without rales, wheezing or rho

## 2021-07-19 ENCOUNTER — Ambulatory Visit: Payer: Medicare PPO | Admitting: Cardiology

## 2021-08-21 ENCOUNTER — Other Ambulatory Visit: Payer: Self-pay

## 2021-08-21 DIAGNOSIS — M810 Age-related osteoporosis without current pathological fracture: Secondary | ICD-10-CM

## 2021-08-23 ENCOUNTER — Ambulatory Visit: Payer: Medicare PPO | Admitting: Podiatry

## 2021-08-23 DIAGNOSIS — M79675 Pain in left toe(s): Secondary | ICD-10-CM

## 2021-08-23 DIAGNOSIS — B351 Tinea unguium: Secondary | ICD-10-CM

## 2021-08-23 DIAGNOSIS — M79674 Pain in right toe(s): Secondary | ICD-10-CM | POA: Diagnosis not present

## 2021-08-24 ENCOUNTER — Other Ambulatory Visit: Payer: Self-pay

## 2021-08-24 DIAGNOSIS — Z1231 Encounter for screening mammogram for malignant neoplasm of breast: Secondary | ICD-10-CM

## 2021-08-25 ENCOUNTER — Ambulatory Visit: Payer: Medicare PPO | Admitting: Podiatry

## 2021-08-30 IMAGING — DX DG ABDOMEN 2V
2 series · 2 of 2 positions shown · non-contrast
Comparison: None.

CLINICAL DATA: Diarrhea

EXAM:
ABDOMEN - 2 VIEW

[abdomen erect]
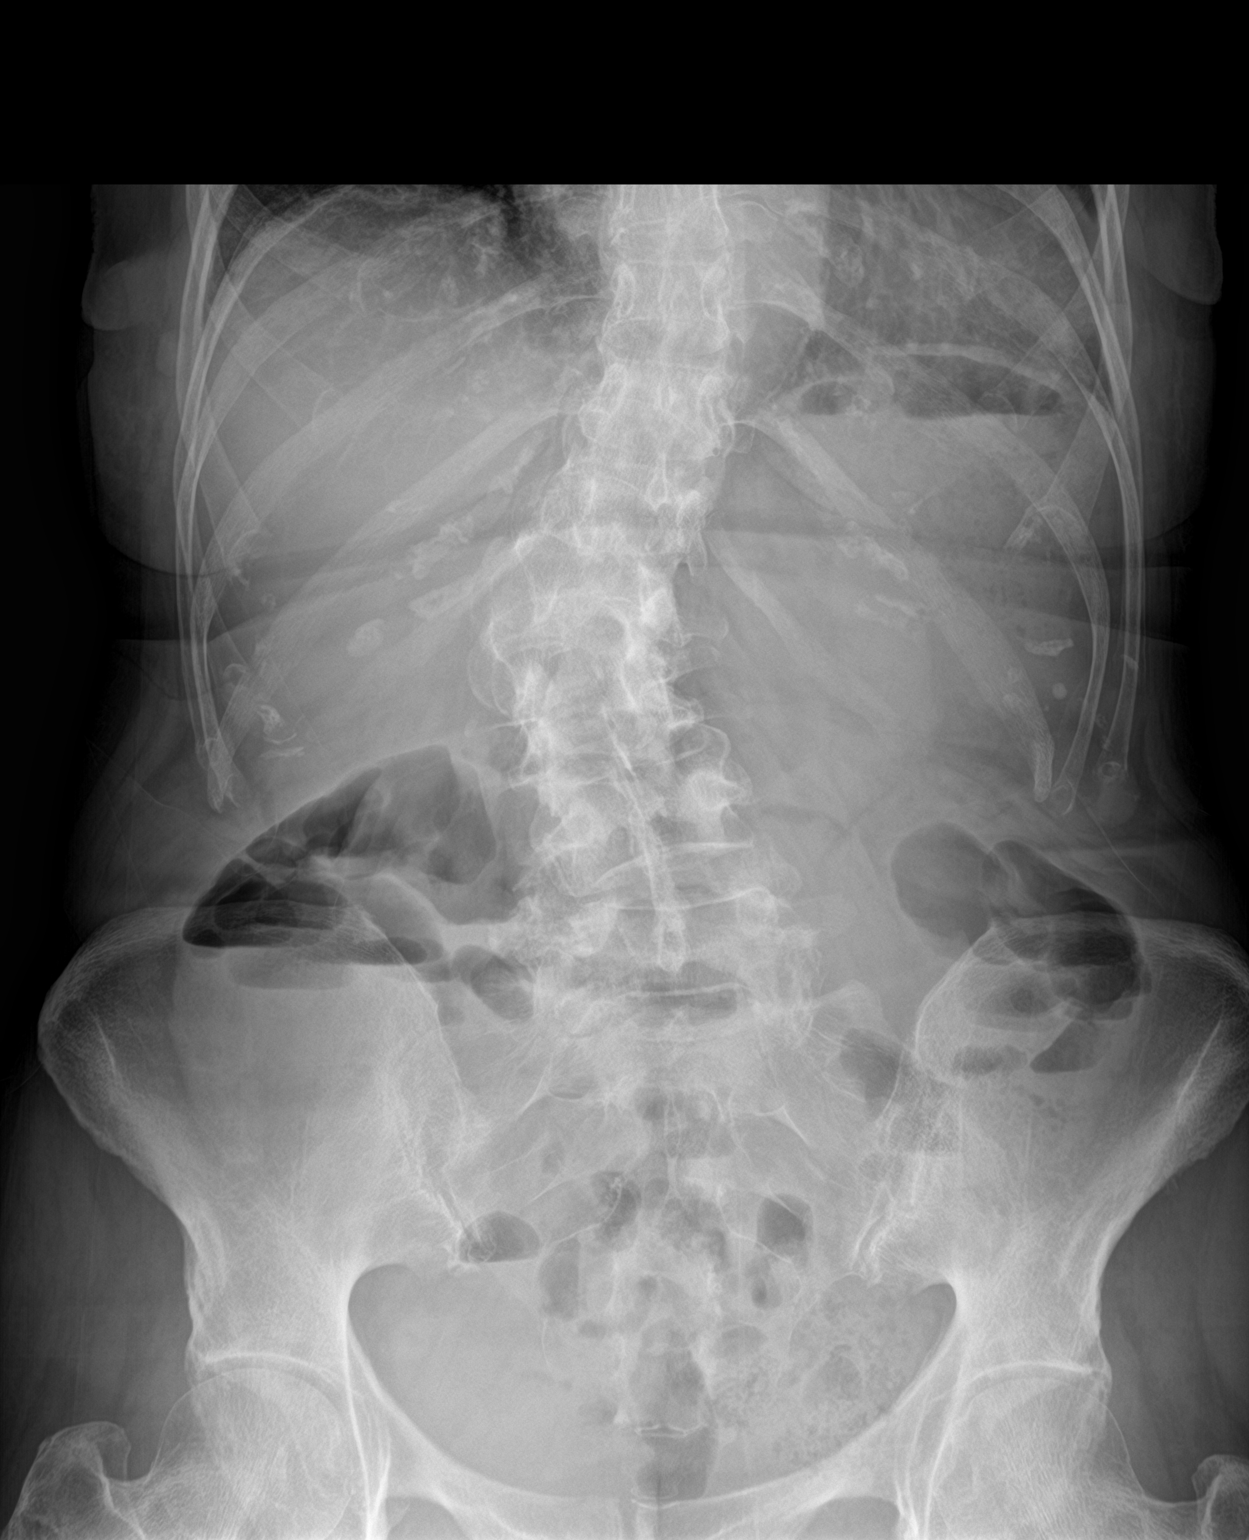

[abdomen supine]
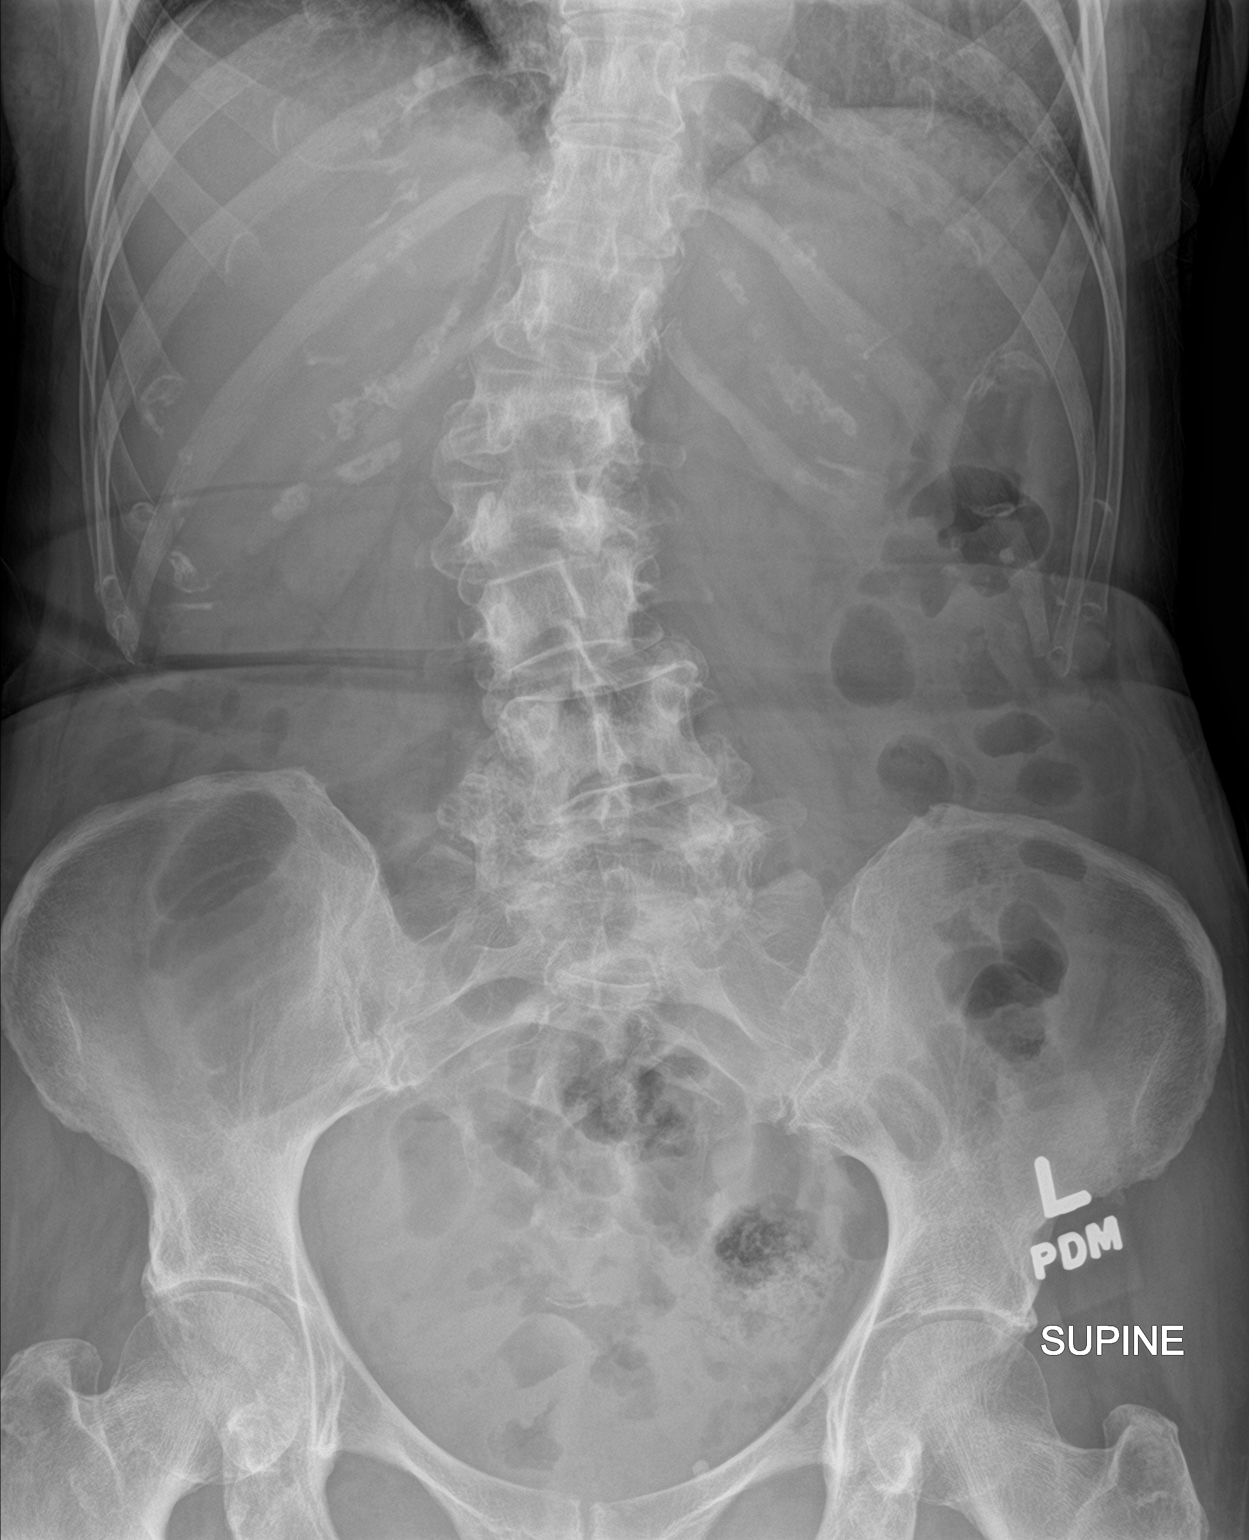

[2 of 2 positions shown; findings below may reference images not displayed]

FINDINGS: Normal abdominal gas pattern. No free intraperitoneal gas. No
organomegaly. Moderate lumbar dextroscoliosis.
IMPRESSION: Normal abdominal gas pattern.

## 2021-08-31 ENCOUNTER — Encounter: Payer: Self-pay | Admitting: Podiatry

## 2021-08-31 DIAGNOSIS — M0609 Rheumatoid arthritis without rheumatoid factor, multiple sites: Secondary | ICD-10-CM | POA: Diagnosis not present

## 2021-08-31 DIAGNOSIS — Z79899 Other long term (current) drug therapy: Secondary | ICD-10-CM | POA: Diagnosis not present

## 2021-08-31 NOTE — Progress Notes (Signed)
?  Subjective:  ?Patient ID: [name], female    DOB: Dec 31, 1940,  MRN: [id number] ? ?[name] presents to clinic today for painful thick toenails that are difficult to trim. Pain interferes with ambulation. Aggravating factors include wearing enclosed shoe gear. Pain is relieved with periodic professional debridement. ? ?New problem(s): None.  ? ?PCP is [name], MD , and last visit was March 20, 2021. ? ?Allergies  ?Allergen Reactions  ? Aspirin   ?  Other reaction(s): GI upset  ? Cox-2 Inhibitors Nausea And Vomiting  ?  Severe abd pain  ? Glucose Diarrhea  ?  cramps  ? Lactose Intolerance (Gi) Diarrhea  ?  Stomach cramps  ? Leflunomide   ?  Other reaction(s): hairloss  ? Methotrexate   ?  Other reaction(s): hairloss  ? Oxycodone-Acetaminophen   ?  Other reaction(s): Unknown  ? Rosuvastatin Diarrhea  ? ? ?Review of Systems: Negative except as noted in the HPI. ? ?Objective: No changes noted in today's physical examination. ? ?[name] is a pleasant 81 y.o. female, thin build, in NAD. AAO x 3. ? ?Vascular Examination: ?Vascular status intact b/l with palpable pedal pulses. CFT immediate b/l. No edema. No pain with calf compression b/l. Skin temperature gradient WNL b/l.  ? ?Neurological Examination: ?Sensation grossly intact b/l with 10 gram monofilament. Vibratory sensation intact b/l.  ? ?Dermatological Examination: ?Pedal skin with normal turgor, texture and tone b/l. Toenails 1-5 b/l thick, discolored, elongated with subungual debris and pain on dorsal palpation. No hyperkeratotic lesions noted b/l.  ? ?Musculoskeletal Examination: ?Muscle strength 5/5 to b/l LE. Hammertoe deformity noted 2-5 b/l. Patient ambulates independently without any assistive aids. ? ?Radiographs: None ? ?Assessment/Plan: ?1. Pain due to onychomycosis of toenails of both feet   ?  ?-Patient was evaluated and treated. All patient's and/or POA's questions/concerns answered on today's visit. ?-Toenails 1-5  bilaterally were debrided in length and girth with sterile nail nippers and dremel. Pinpoint bleeding of L 2nd toe addressed with Lumicain Hemostatic Solution, cleansed with alcohol. triple antibiotic ointment applied. Patient instructed to apply triple antibiotic ointment once daily for 7 days. ?-Patient/POA to call should there be question/concern in the interim.  ? ?Return in about 3 months (around 11/22/2021). ? ?Marzetta Board, DPM  ?

## 2021-09-04 DIAGNOSIS — R899 Unspecified abnormal finding in specimens from other organs, systems and tissues: Secondary | ICD-10-CM | POA: Diagnosis not present

## 2021-09-04 DIAGNOSIS — M0609 Rheumatoid arthritis without rheumatoid factor, multiple sites: Secondary | ICD-10-CM | POA: Diagnosis not present

## 2021-09-06 ENCOUNTER — Ambulatory Visit: Payer: Medicare PPO

## 2021-09-07 ENCOUNTER — Other Ambulatory Visit: Payer: Self-pay | Admitting: Internal Medicine

## 2021-09-08 DIAGNOSIS — H0011 Chalazion right upper eyelid: Secondary | ICD-10-CM | POA: Diagnosis not present

## 2021-09-12 ENCOUNTER — Telehealth: Payer: Self-pay | Admitting: Internal Medicine

## 2021-09-12 MED ORDER — GABAPENTIN 300 MG PO CAPS: 300.0000 mg | ORAL_CAPSULE | Freq: Every day | ORAL | 0 refills | Status: DC

## 2021-09-12 NOTE — Telephone Encounter (Signed)
[name] ?[phone] ? ?Quetzal called to say she is about out of her below medication, she hasn't gotten it in a long time, so she thought she might need to get it. She likes to keep it on hand. I let her know since she hasn't gotten it in several years she may need to talk with you about it before refilling it and she said she had an appointment in a couple of weeks she would talk with you them. She only takes it once in awhile for pain. Last prescription was 01/2019 ? ?gabapentin (NEURONTIN) 300 MG capsule ?

## 2021-09-15 ENCOUNTER — Other Ambulatory Visit: Payer: Medicare PPO

## 2021-09-15 DIAGNOSIS — E7800 Pure hypercholesterolemia, unspecified: Secondary | ICD-10-CM

## 2021-09-15 DIAGNOSIS — R5383 Other fatigue: Secondary | ICD-10-CM

## 2021-09-15 NOTE — Addendum Note (Signed)
Addended by: Angus Seller on: 09/15/2021 10:25 AM   Modules accepted: Orders

## 2021-09-16 LAB — HEPATIC FUNCTION PANEL
AG Ratio: 1.4 (calc) (ref 1.0–2.5)
ALT: 14 U/L (ref 6–29)
AST: 20 U/L (ref 10–35)
Albumin: 4.4 g/dL (ref 3.6–5.1)
Alkaline phosphatase (APISO): 55 U/L (ref 37–153)
Bilirubin, Direct: 0.1 mg/dL (ref 0.0–0.2)
Globulin: 3.1 g/dL (calc) (ref 1.9–3.7)
Indirect Bilirubin: 0.5 mg/dL (calc) (ref 0.2–1.2)
Total Bilirubin: 0.6 mg/dL (ref 0.2–1.2)
Total Protein: 7.5 g/dL (ref 6.1–8.1)

## 2021-09-16 LAB — LIPID PANEL
Cholesterol: 233 mg/dL — ABNORMAL HIGH (ref <200)
HDL: 126 mg/dL (ref >=50)
LDL Cholesterol (Calc): 91 mg/dL (calc)
Non-HDL Cholesterol (Calc): 107 mg/dL (calc) (ref <130)
Total CHOL/HDL Ratio: 1.8 (calc) (ref <5.0)
Triglycerides: 74 mg/dL (ref <150)

## 2021-09-16 LAB — T4, FREE: Free T4: 1.2 ng/dL (ref 0.8–1.8)

## 2021-09-16 LAB — TSH: TSH: 2.32 mIU/L (ref 0.40–4.50)

## 2021-09-18 ENCOUNTER — Ambulatory Visit: Payer: Medicare PPO | Admitting: Internal Medicine

## 2021-09-19 ENCOUNTER — Ambulatory Visit: Payer: Medicare PPO | Admitting: Internal Medicine

## 2021-09-19 ENCOUNTER — Encounter: Payer: Self-pay | Admitting: Internal Medicine

## 2021-09-19 VITALS — BP 98/62 | HR 74 | Temp 98.8°F | Ht 63.25 in | Wt 120.2 lb

## 2021-09-19 DIAGNOSIS — H903 Sensorineural hearing loss, bilateral: Secondary | ICD-10-CM | POA: Diagnosis not present

## 2021-09-19 DIAGNOSIS — Z7901 Long term (current) use of anticoagulants: Secondary | ICD-10-CM

## 2021-09-19 DIAGNOSIS — I4819 Other persistent atrial fibrillation: Secondary | ICD-10-CM | POA: Diagnosis not present

## 2021-09-19 DIAGNOSIS — K52832 Lymphocytic colitis: Secondary | ICD-10-CM | POA: Diagnosis not present

## 2021-09-19 DIAGNOSIS — M8580 Other specified disorders of bone density and structure, unspecified site: Secondary | ICD-10-CM | POA: Diagnosis not present

## 2021-10-05 DIAGNOSIS — Z1231 Encounter for screening mammogram for malignant neoplasm of breast: Secondary | ICD-10-CM | POA: Diagnosis not present

## 2021-10-05 LAB — HM MAMMOGRAPHY

## 2021-10-06 ENCOUNTER — Encounter: Payer: Self-pay | Admitting: Internal Medicine

## 2021-10-13 ENCOUNTER — Other Ambulatory Visit: Payer: Self-pay | Admitting: Internal Medicine

## 2021-10-13 NOTE — Telephone Encounter (Signed)
Patient seen in November.

## 2021-10-22 ENCOUNTER — Encounter: Payer: Self-pay | Admitting: Internal Medicine

## 2021-10-23 NOTE — Progress Notes (Signed)
Cardiology Office Note:    Date:  10/24/2021   ID:  [name], DOB [birth year], MRN [id number]  PCP:  Margaree Mackintosh, MD  Cardiologist:  None  Electrophysiologist:  None   Referring MD: Margaree Mackintosh, MD   Chief Complaint  Patient presents with   Atrial Fibrillation     History of Present Illness:    [name] is a 81 y.o. female with a hx of  hyperlipidemia, rheumatoid arthritis, lymphocytic colitis, GERD who presents for follow-up.  She was initially seen on 04/03/2019, had been referred by Dr. Lenord Fellers for evaluation of new onset atrial fibrillation.  At initial clinic visit, she was noted to be in AF with rates 110s, was started on metoprolol 25 mg twice daily for rate control.  She was started on Eliquis 2.5 mg twice daily for anticoagulation.  TTE on 04/30/2019 showed EF 60 to 65%, normal RV function, severe left atrial dilatation, mild right atrial dilatation, mild MR, mild AI.  On 05/11/2019, she underwent a successful DCCV to restore sinus rhythm.  Following cardioversion, she reported that her heart rate would fall to the 40s, metoprolol dose was decreased to 12.5 mg twice daily.  At follow-up clinic on 07/03/2019, she was noted to be back in atrial fibrillation.  She was referred to the A. fib clinic, and rhythm control options were discussed but patient elected to continue with rate control strategy.  Zio patch x3 days on 12/02/2019 showed 100s in AF burden with average rate 78 bpm.  Calcium score 121 (50th percentile) on 01/09/2021.  Since her last clinic visit, she reports that she has been doing well.  Denies any chest pain, dyspnea,  lower extremity edema, or palpitations.  Exercises at least twice per week for 1 hour and also will go for walks.  She denies any exertional symptoms when she exercises but does report some dyspnea with walking up stairs.  She reports was having some lightheadedness but resolved with increasing her water intake.  She denies any syncope.  Does  report some swelling in her left ankle.  Denies any bleeding issues on Eliquis.   Wt Readings from Last 3 Encounters:  10/24/21 119 lb 14.4 oz (54.4 kg)  09/19/21 120 lb 4 oz (54.5 kg)  03/20/21 117 lb (53.1 kg)     Past Medical History:  Diagnosis Date   A-fib (HCC)    Allergy    seasonal   Anemia    Cataract    bil cateracts removed   Chronic constipation    Diarrhea    chronic diarrheafor over 2 months   GERD (gastroesophageal reflux disease)    Hyperlipidemia    Lymphocytic colitis w/ patchy collagen deposition 09/28/2016   Rheumatoid arthritis (HCC)    Status post dilation of esophageal narrowing 2014   Wears glasses     Past Surgical History:  Procedure Laterality Date   CARDIOVERSION N/A 05/11/2019   Procedure: CARDIOVERSION;  Surgeon: Jodelle Red, MD;  Location: [facility] ENDOSCOPY;  Service: Cardiovascular;  Laterality: N/A;   CATARACT EXTRACTION  06/30/2016 -right eye   left eye in 08/15/16   COLONOSCOPY     DILATION AND CURETTAGE OF UTERUS  2009   ESOPHAGEAL DILATION  05/31/1997   REPAIR EXTENSOR TENDON Right 05/14/2013   Procedure: LONG FINGER EXTENSOR TO ELEVATE RING AND SMALL FINGERS ;  Surgeon: Wyn Forster., MD;  Location: [facility];  Service: Orthopedics;  Laterality: Right;   seborrheic keratosis  09/03/12  inflamed, excised from forehead   TENDON TRANSFER Right 09/03/2013   Procedure: RIGHT HAND FLEXOR CARPI RADIALUS TRANSFER TO LONG/RING/SMALL;  Surgeon: Wyn Forster., MD;  Location: [facility];  Service: Orthopedics;  Laterality: Right;   TONSILLECTOMY     TUBAL LIGATION     ULNAR HEAD EXCISION Right 05/14/2013   Procedure: ARTHOPLASTY DISTAL RADIAL ULNAR JOINT, RIGHT ;  Surgeon: Wyn Forster., MD;  Location:  SURGERY CENTER;  Service: Orthopedics;  Laterality: Right;   UPPER GASTROINTESTINAL ENDOSCOPY      Current Medications: Current Meds  Medication Sig   acetaminophen (TYLENOL) 500 MG  tablet Take 500-1,000 mg by mouth every 8 (eight) hours as needed for moderate pain or headache.    apixaban (ELIQUIS) 2.5 MG TABS tablet Take 1 tablet (2.5 mg total) by mouth 2 (two) times daily.   atorvastatin (LIPITOR) 20 MG tablet Take 1 tablet (20 mg total) by mouth 3 (three) times a week.   calcium carbonate (TUMS EX) 750 MG chewable tablet Chew 1,500 mg by mouth 2 (two) times daily.    Cholecalciferol (VITAMIN D3) 50 MCG (2000 UT) capsule TAKE 1 CAPSULE EVERY DAY   diphenoxylate-atropine (LOMOTIL) 2.5-0.025 MG tablet TAKE 2 TABLETS BY MOUTH 4 (FOUR) TIMES DAILY AS NEEDED FOR DIARRHEA OR LOOSE STOOLS. MAXIMUM 8 TABLETS DAILY   esomeprazole (NEXIUM) 40 MG capsule TAKE 1 CAPSULE(40 MG) BY MOUTH DAILY BEFORE A MEAL   finasteride (PROSCAR) 5 MG tablet TAKE 1 TABLET EVERY DAY   gabapentin (NEURONTIN) 300 MG capsule Take 1 capsule (300 mg total) by mouth at bedtime.   hydrocortisone cream 1 % Apply 1 application topically daily as needed for itching (burns).   InFLIXimab (REMICADE IV) Inject 1 Dose into the vein See admin instructions. Remicade Infusion every 2 months   Ivermectin 1 % CREA 1 application to affected area   lactase (LACTAID) 3000 units tablet 1 tablet   loratadine (CLARITIN) 10 MG tablet 1 tablet   metoprolol succinate (TOPROL-XL) 25 MG 24 hr tablet TAKE 1 TABLET EVERY DAY   minoxidil (ROGAINE) 2 % external solution Apply 1 application topically 2 (two) times daily.   Multiple Vitamins-Minerals (PRESERVISION AREDS 2) CAPS Take 1 capsule by mouth 2 (two) times daily.   neomycin-bacitracin-polymyxin (NEOSPORIN) ointment Apply 1 application topically as needed for wound care.   omeprazole (PRILOSEC) 20 MG capsule Take by mouth.     Allergies:   Aspirin, Cox-2 inhibitors, Glucose, Lactose intolerance (gi), Leflunomide, Methotrexate, Oxycodone-acetaminophen, Rosuvastatin, and Sulfa antibiotics   Social History   Socioeconomic History   Marital status: Married    Spouse name: Merlyn Albert    Number of children: 1   Years of education: 16   Highest education level: Not on file  Occupational History   Occupation: Retired- Dentist: RETIRED  Tobacco Use   Smoking status: Never   Smokeless tobacco: Never  Vaping Use   Vaping Use: Never used  Substance and Sexual Activity   Alcohol use: Yes    Alcohol/week: 21.0 standard drinks of alcohol    Types: 21 Standard drinks or equivalent per week    Comment: 3 times weekly   Drug use: No   Sexual activity: Not on file  Other Topics Concern   Not on file  Social History Narrative   Health Care POA: Husband, Fred   Emergency Contact: Chrystine Oiler of Life Plan:    Who lives with you: husband   Son,  in [place]   Any pets: 3 cats   Diet: Pt has a varied diet of protein, starch, and vegetables.   Exercise: Pt works with trainer 2x a week for 60 minutes   Seatbelts: Pt reports wearing seatbelt when in vehicle.   Sun Exposure/Protection: Pt reports not using sun protection.   Hobbies: gardening, reading, concerts               Social Determinants of Health   Financial Resource Strain: Not on file  Food Insecurity: Not on file  Transportation Needs: Not on file  Physical Activity: Not on file  Stress: Not on file  Social Connections: Not on file     Family History: The patient's family history includes COPD in her father; Colon cancer in her paternal uncle; Heart disease in her mother; Osteoporosis in her maternal aunt, maternal grandmother, and mother. There is no history of Esophageal cancer, Pancreatic cancer, Rectal cancer, or Stomach cancer.  ROS:   Please see the history of present illness.    A whole 8 I get asked the minimum of once a day bowel dialysis I assessment all other systems reviewed and are negative.  EKGs/Labs/Other Studies Reviewed:    The following studies were reviewed today:   EKG:   10/24/2021: Atrial fibrillation, rate 83  Recent Labs: 03/17/2021: BUN 14; Creat 0.69;  Hemoglobin 13.5; Platelets 188; Potassium 4.4; Sodium 141 09/15/2021: ALT 14; TSH 2.32  Recent Lipid Panel    Component Value Date/Time   CHOL 233 (H) 09/15/2021 1106   TRIG 74 09/15/2021 1106   HDL 126 09/15/2021 1106   CHOLHDL 1.8 09/15/2021 1106   VLDL 19 08/03/2016 1001   LDLCALC 91 09/15/2021 1106   LDLDIRECT 149 (H) 11/11/2007 2043   TTE 04/30/19: 1. Left ventricular ejection fraction, by visual estimation, is 60 to  65%. The left ventricle has normal function. Left ventricular septal wall  thickness was normal. Normal left ventricular posterior wall thickness.  There is no left ventricular  hypertrophy.   2. Left ventricular diastolic parameters are indeterminate.   3. The left ventricle has no regional wall motion abnormalities.   4. Global right ventricle has normal systolic function.The right  ventricular size is normal. No increase in right ventricular wall  thickness.   5. Left atrial size was severely dilated.   6. Right atrial size was mildly dilated.   7. The mitral valve is normal in structure. Mild mitral valve  regurgitation. No evidence of mitral stenosis.   8. The tricuspid valve is normal in structure.   9. The aortic valve is tricuspid. Aortic valve regurgitation is mild. No  evidence of aortic valve sclerosis or stenosis.  10. The pulmonic valve was normal in structure. Pulmonic valve  regurgitation is not visualized.  11. Normal pulmonary artery systolic pressure.  12. The inferior vena cava is dilated in size with >50% respiratory  variability, suggesting right atrial pressure of 8 mmHg.   Physical Exam:    VS:  BP 114/76 (BP Location: Right Arm, Patient Position: Sitting, Cuff Size: Normal)   Pulse 83   Ht 5\' 3"  (1.6 m)   Wt 119 lb 14.4 oz (54.4 kg)   BMI 21.24 kg/m     Wt Readings from Last 3 Encounters:  10/24/21 119 lb 14.4 oz (54.4 kg)  09/19/21 120 lb 4 oz (54.5 kg)  03/20/21 117 lb (53.1 kg)     GEN: in no acute distress HEENT:  Normal NECK: No JVD CARDIAC: irregular,  no murmurs, rubs, gallops RESPIRATORY:  Clear to auscultation without rales, wheezing or rhonchi  ABDOMEN: Soft, non-tender, non-distended MUSCULOSKELETAL:  No edema; No deformity  SKIN: Warm and dry NEUROLOGIC:  Alert and oriented x 3 PSYCHIATRIC:  Normal affect   ASSESSMENT:    1. Permanent atrial fibrillation (HCC)   2. Hyperlipidemia, unspecified hyperlipidemia type   3. Lung nodule     PLAN:     Permanent atrial fibrillation: CHADS-VASc score 3 (agex2, female).  Appears rate controlled on metoprolol.  Successful DCCV on 05/11/2019, however was back in atrial fibrillation at follow-up appointment on 07/03/2019.  TTE shows normal LV systolic function, severe left atrial dilatation.  She was seen in AF clinic and decided to pursue rate control strategy as rates well controlled and appears asymptomatic.  Zio patch x3 days on 12/02/2019 showed 100% AF burden with average rate 78 bpm. -Continue Eliquis.  Given age>80 and weight <60kg, meets indication for reduced dose.  Will continue 2.5 mg twice daily -Continue metoprolol 25 mg daily   Hyperlipidemia: LDL 118 05/2199, she started on rosuvastatin 5 mg three times weekly and LDL improved to 59 02/2020.  She stopped taking the rosuvastatin.  Calcium score 121 (50th percentile) on 01/09/2021.  Restarted atorvastatin 20 mg 3 times weekly.  LDL 91 on 09/15/2021.  Also with excellent HDL (126)  Pulmonary nodules: Bilateral small pulmonary nodules noted on calcium score 12/2020.  CT chest in 12 months ordered for follow-up  RTC in 6 months   Medication Adjustments/Labs and Tests Ordered: Current medicines are reviewed at length with the patient today.  Concerns regarding medicines are outlined above.  Orders Placed This Encounter  Procedures   CT CHEST WO CONTRAST   EKG 12-Lead    No orders of the defined types were placed in this encounter.    Patient Instructions  Medication Instructions:  Your  physician recommends that you continue on your current medications as directed. Please refer to the Current Medication list given to you today.  *If you need a refill on your cardiac medications before your next appointment, please call your pharmacy*  Testing/Procedures: CT chest in September-we will call to schedule this.  Follow-Up: At [facility], you and your health needs are our priority.  As part of our continuing mission to provide you with exceptional heart care, we have created designated Provider Care Teams.  These Care Teams include your primary Cardiologist (physician) and Advanced Practice Providers (APPs -  Physician Assistants and Nurse Practitioners) who all work together to provide you with the care you need, when you need it.  We recommend signing up for the patient portal called "MyChart".  Sign up information is provided on this After Visit Summary.  MyChart is used to connect with patients for Virtual Visits (Telemedicine).  Patients are able to view lab/test results, encounter notes, upcoming appointments, etc.  Non-urgent messages can be sent to your provider as well.   To learn more about what you can do with MyChart, go to [email or web address].    Your next appointment:   6 month(s)  The format for your next appointment:   In Person  Provider:   Dr. Bjorn Pippin  Important Information About Sugar         Signed, Little Ishikawa, MD  10/24/2021 5:11 PM    [facility]

## 2021-10-24 ENCOUNTER — Other Ambulatory Visit: Payer: Self-pay | Admitting: Internal Medicine

## 2021-10-24 ENCOUNTER — Ambulatory Visit (HOSPITAL_BASED_OUTPATIENT_CLINIC_OR_DEPARTMENT_OTHER): Payer: Medicare PPO | Admitting: Cardiology

## 2021-10-24 VITALS — BP 114/76 | HR 83 | Ht 63.0 in | Wt 119.9 lb

## 2021-10-24 DIAGNOSIS — R911 Solitary pulmonary nodule: Secondary | ICD-10-CM | POA: Diagnosis not present

## 2021-10-24 DIAGNOSIS — I4821 Permanent atrial fibrillation: Secondary | ICD-10-CM

## 2021-10-24 DIAGNOSIS — E785 Hyperlipidemia, unspecified: Secondary | ICD-10-CM

## 2021-11-09 DIAGNOSIS — M0609 Rheumatoid arthritis without rheumatoid factor, multiple sites: Secondary | ICD-10-CM | POA: Diagnosis not present

## 2021-11-23 ENCOUNTER — Other Ambulatory Visit: Payer: Self-pay | Admitting: Cardiology

## 2021-11-30 ENCOUNTER — Ambulatory Visit: Payer: Medicare PPO | Admitting: Cardiology

## 2021-12-05 ENCOUNTER — Ambulatory Visit: Payer: Medicare PPO | Admitting: Podiatry

## 2021-12-12 ENCOUNTER — Other Ambulatory Visit: Payer: Self-pay | Admitting: Cardiology

## 2021-12-12 NOTE — Telephone Encounter (Signed)
Prescription refill request for Eliquis received. Indication:Afib Last office visit:6/23 Scr:0.6 Age: 81 Weight:54.4 kg  Prescription refilled

## 2021-12-22 ENCOUNTER — Telehealth: Payer: Self-pay | Admitting: Internal Medicine

## 2021-12-22 DIAGNOSIS — Z0289 Encounter for other administrative examinations: Secondary | ICD-10-CM

## 2021-12-22 NOTE — Telephone Encounter (Signed)
Completed and faxed back forms to [facility] [phone], phone [phone]  Confidential Medical Profile Release of Information form Confidential Personal Health History filled out by patient Current medication list Immunization List H&P Last Office Visit

## 2021-12-22 NOTE — Telephone Encounter (Signed)
Received Forms from [facility] to be completed for Independent Living on 12/21/2021

## 2022-01-02 ENCOUNTER — Other Ambulatory Visit: Payer: Self-pay

## 2022-01-02 ENCOUNTER — Telehealth: Payer: Self-pay | Admitting: Internal Medicine

## 2022-01-02 DIAGNOSIS — H5212 Myopia, left eye: Secondary | ICD-10-CM | POA: Diagnosis not present

## 2022-01-02 DIAGNOSIS — H532 Diplopia: Secondary | ICD-10-CM | POA: Diagnosis not present

## 2022-01-02 NOTE — Telephone Encounter (Signed)
Received Fax RX request from  Pharmacy -  [id number] [place] [place]  [phone] Fax:  [phone]     Medication - Cholecalciferol (VITAMIN D3) 50 MCG (2000 UT) capsule  Last Refill - Last ordered 04/04/21 with 3 refills  Last OV - 09/19/21  Last CPE - 03/20/21  Next Appointment - 03/30/22

## 2022-01-03 MED ORDER — VITAMIN D3 50 MCG (2000 UT) PO CAPS
2000.0000 [IU] | ORAL_CAPSULE | Freq: Every day | ORAL | 3 refills | Status: DC
  Filled ????-??-??: fill #0

## 2022-01-03 NOTE — Telephone Encounter (Signed)
Rx sent to the pharmacy.

## 2022-01-04 DIAGNOSIS — M0609 Rheumatoid arthritis without rheumatoid factor, multiple sites: Secondary | ICD-10-CM | POA: Diagnosis not present

## 2022-01-11 ENCOUNTER — Ambulatory Visit
Admission: RE | Admit: 2022-01-11 | Discharge: 2022-01-11 | Disposition: A | Discharge status: Home / Self Care | Payer: Medicare PPO | Source: Ambulatory Visit | Attending: Cardiology | Admitting: Cardiology

## 2022-01-11 DIAGNOSIS — R918 Other nonspecific abnormal finding of lung field: Secondary | ICD-10-CM | POA: Diagnosis not present

## 2022-01-11 DIAGNOSIS — R911 Solitary pulmonary nodule: Secondary | ICD-10-CM

## 2022-01-11 DIAGNOSIS — J449 Chronic obstructive pulmonary disease, unspecified: Secondary | ICD-10-CM | POA: Diagnosis not present

## 2022-01-15 ENCOUNTER — Encounter: Payer: Self-pay | Admitting: *Deleted

## 2022-01-22 ENCOUNTER — Encounter: Payer: Self-pay | Admitting: Internal Medicine

## 2022-01-22 ENCOUNTER — Telehealth: Payer: Self-pay | Admitting: Internal Medicine

## 2022-01-22 MED ORDER — METOPROLOL SUCCINATE ER 25 MG PO TB24: 25.0000 mg | ORAL_TABLET | Freq: Every day | ORAL | 1 refills | Status: DC

## 2022-01-22 NOTE — Telephone Encounter (Signed)
[name] [phone]  [name] called to say she would like a refill of below medicine sent to below pharmacy she does not want to use [facility] in any more.  metoprolol succinate (TOPROL-XL) 25 MG 24 hr tablet   [id number] [place]: [phone] Fax: [phone]

## 2022-01-22 NOTE — Telephone Encounter (Signed)
Patient desires to transfer Metoprolol Rx to local pharamacy. This was done today. MJB, MD

## 2022-01-24 DIAGNOSIS — H532 Diplopia: Secondary | ICD-10-CM | POA: Diagnosis not present

## 2022-01-30 DIAGNOSIS — M25572 Pain in left ankle and joints of left foot: Secondary | ICD-10-CM | POA: Diagnosis not present

## 2022-01-30 DIAGNOSIS — Z6820 Body mass index (BMI) 20.0-20.9, adult: Secondary | ICD-10-CM | POA: Diagnosis not present

## 2022-01-30 DIAGNOSIS — I7300 Raynaud's syndrome without gangrene: Secondary | ICD-10-CM | POA: Diagnosis not present

## 2022-01-30 DIAGNOSIS — M1991 Primary osteoarthritis, unspecified site: Secondary | ICD-10-CM | POA: Diagnosis not present

## 2022-01-30 DIAGNOSIS — M0609 Rheumatoid arthritis without rheumatoid factor, multiple sites: Secondary | ICD-10-CM | POA: Diagnosis not present

## 2022-02-05 DIAGNOSIS — D1722 Benign lipomatous neoplasm of skin and subcutaneous tissue of left arm: Secondary | ICD-10-CM | POA: Diagnosis not present

## 2022-02-05 DIAGNOSIS — L649 Androgenic alopecia, unspecified: Secondary | ICD-10-CM | POA: Diagnosis not present

## 2022-02-05 DIAGNOSIS — L718 Other rosacea: Secondary | ICD-10-CM | POA: Diagnosis not present

## 2022-02-05 DIAGNOSIS — D1801 Hemangioma of skin and subcutaneous tissue: Secondary | ICD-10-CM | POA: Diagnosis not present

## 2022-02-05 DIAGNOSIS — L821 Other seborrheic keratosis: Secondary | ICD-10-CM | POA: Diagnosis not present

## 2022-02-15 DIAGNOSIS — M85832 Other specified disorders of bone density and structure, left forearm: Secondary | ICD-10-CM | POA: Diagnosis not present

## 2022-02-15 DIAGNOSIS — Z780 Asymptomatic menopausal state: Secondary | ICD-10-CM | POA: Diagnosis not present

## 2022-02-15 LAB — HM DEXA SCAN

## 2022-02-16 ENCOUNTER — Encounter: Payer: Self-pay | Admitting: Internal Medicine

## 2022-03-05 ENCOUNTER — Ambulatory Visit (INDEPENDENT_AMBULATORY_CARE_PROVIDER_SITE_OTHER): Payer: Medicare PPO

## 2022-03-05 VITALS — BP 124/80 | Temp 97.8°F

## 2022-03-05 DIAGNOSIS — Z7185 Encounter for immunization safety counseling: Secondary | ICD-10-CM | POA: Diagnosis not present

## 2022-03-05 DIAGNOSIS — Z23 Encounter for immunization: Secondary | ICD-10-CM

## 2022-03-12 DIAGNOSIS — Z79899 Other long term (current) drug therapy: Secondary | ICD-10-CM | POA: Diagnosis not present

## 2022-03-12 DIAGNOSIS — R5383 Other fatigue: Secondary | ICD-10-CM | POA: Diagnosis not present

## 2022-03-12 DIAGNOSIS — M0609 Rheumatoid arthritis without rheumatoid factor, multiple sites: Secondary | ICD-10-CM | POA: Diagnosis not present

## 2022-03-19 ENCOUNTER — Encounter: Payer: Self-pay | Admitting: Podiatry

## 2022-03-19 ENCOUNTER — Ambulatory Visit: Payer: Medicare PPO | Admitting: Podiatry

## 2022-03-19 DIAGNOSIS — B351 Tinea unguium: Secondary | ICD-10-CM | POA: Diagnosis not present

## 2022-03-19 DIAGNOSIS — M79675 Pain in left toe(s): Secondary | ICD-10-CM

## 2022-03-19 DIAGNOSIS — M79674 Pain in right toe(s): Secondary | ICD-10-CM | POA: Diagnosis not present

## 2022-03-20 ENCOUNTER — Ambulatory Visit: Payer: Medicare PPO | Admitting: Sports Medicine

## 2022-03-20 VITALS — BP 110/64 | Ht 64.0 in | Wt 114.0 lb

## 2022-03-20 DIAGNOSIS — M25572 Pain in left ankle and joints of left foot: Secondary | ICD-10-CM | POA: Diagnosis not present

## 2022-03-20 DIAGNOSIS — G8929 Other chronic pain: Secondary | ICD-10-CM

## 2022-03-20 DIAGNOSIS — M19072 Primary osteoarthritis, left ankle and foot: Secondary | ICD-10-CM

## 2022-03-20 NOTE — Progress Notes (Signed)
  SUBJECTIVE:   CHIEF COMPLAINT / HPI:   Mr. Bloodsaw is an 81 year old who is presenting with 2 weeks of left ankle pain present to lateral part of ankle and wraps to the front with weightbearing activities.  She has tried Tylenol and naproxen with little improvement.  She is also using compression ankle sleeves throughout the day. She is having difficulty walking due to pain. She has tried us9ing multiple pads in her shoes.  Heel lifts help some but not working well now.  Pain is more lateral along the ankle and malleolus  MRI left ankle in 2017 showed osteoarthritic changes with possible loose body and ultrasound showed chronic degenerative changes. Split peroneus brevis tendon; torn ATFL  PERTINENT  PMH / PSH: Rheumatoid arthritis  Past Medical History:  Diagnosis Date   A-fib ([place])    Allergy    seasonal   Anemia    Cataract    bil cateracts removed   Chronic constipation    Diarrhea    chronic diarrheafor over 2 months   GERD (gastroesophageal reflux disease)    Hyperlipidemia    Lymphocytic colitis w/ patchy collagen deposition 09/28/2016   Rheumatoid arthritis ([place])    Status post dilation of esophageal narrowing 2014   Wears glasses     OBJECTIVE:  BP 110/64   Ht 5' 4"  (1.626 m)   Wt 114 lb (51.7 kg)   BMI 19.57 kg/m  Constitutional: well-appearing  MSK:  Left ankle- No swelling or ecchymosis present, Squaring of lateral malleolus with spurring of medial malleolus, no tenderness to palpation present along tarsal dome or lateral or medial malleolus.  Anterior drawer negative Talar tilt negative  U/S left ankle: Splitting of peroneal brevis with calcification and spurring, arthritic changes to talar dome, avulsed spur at medial malleolus   ASSESSMENT/PLAN:   Left ankle pain- 2/2 to chronic osteoarthritis. Ultrasound showed pathology at medial and lateral malleolus, but she reports pain only laterally. Conservative treatment with Hapad heel wedge placed to left  heel to offload lateral pain. Continue compression sleeve with tylenol and naproxen as needed. -F/u if pain continues.   Chadwin Fury M. Amra Shukla, D.O.  Internal Medicine Resident, PGY-2 [facility]

## 2022-03-20 NOTE — Assessment & Plan Note (Addendum)
Ultrasound showed pathology at medial and lateral malleolus, but she reports pain only laterally. Conservative treatment with Hapad placed to heel to offload lateral pain. Continue compression sleeve with tylenol and naproxen as needed. -F/u if pain continues.  After placement of lateral heel wedge she seemed much improved with sxs  Try this for a month

## 2022-03-24 NOTE — Progress Notes (Signed)
  Subjective:  Patient ID: [name], female    DOB: October 31, 1940,  MRN: [id number]  [name] presents to clinic today for:  Chief Complaint  Patient presents with   Nail Problem    Routine foot care PCP-Baxley PCP VST-1 year ago  .  PCP is Baxley, Cresenciano Lick, MD.  Allergies  Allergen Reactions   Aspirin     Other reaction(s): GI upset   Cox-2 Inhibitors Nausea And Vomiting    Severe abd pain   Glucose Diarrhea    cramps   Lactose Intolerance (Gi) Diarrhea    Stomach cramps   Leflunomide     Other reaction(s): hairloss   Methotrexate     Other reaction(s): hairloss   Oxycodone-Acetaminophen     Other reaction(s): Unknown   Rosuvastatin Diarrhea   Sulfa Antibiotics     Unknown as child    Review of Systems: Negative except as noted in the HPI.  Objective: No changes noted in today's physical examination. [name] is a pleasant 81 y.o. female in NAD. AAO x 3.  Vascular Examination: Capillary refill time <3 seconds b/l LE. Palpable pedal pulses b/l LE. Digital hair sparse b/l. No pedal edema b/l. Skin temperature gradient WNL b/l. Marland Kitchen  Dermatological Examination: Pedal skin with normal turgor, texture and tone b/l. No open wounds. No interdigital macerations b/l. Toenails 1-5 b/l thickened, discolored, dystrophic with subungual debris. There is pain on palpation to dorsal aspect of nailplates. No hyperkeratotic nor porokeratotic lesions present on today's visit.Marland Kitchen  Neurological Examination: Protective sensation intact with 10 gram monofilament b/l LE. Vibratory sensation intact b/l LE.   Musculoskeletal Examination: Muscle strength 5/5 to all LE muscle groups b/l. Hammertoe deformity noted 2-5 b/l. Patient ambulates independent of any assistive aids.  Assessment/Plan: 1. Pain due to onychomycosis of toenails of both feet     No orders of the defined types were placed in this encounter.   -Consent given for treatment as described below: -Examined  patient. -Continue supportive shoe gear daily. -Mycotic toenails 1-5 bilaterally were debrided in length and girth with sterile nail nippers and dremel without incident. -Patient/POA to call should there be question/concern in the interim.   Return in about 3 months (around 06/19/2022).  Marzetta Board, DPM

## 2022-03-26 ENCOUNTER — Other Ambulatory Visit: Payer: Medicare PPO

## 2022-03-26 DIAGNOSIS — Z0000 Encounter for general adult medical examination without abnormal findings: Secondary | ICD-10-CM

## 2022-03-26 DIAGNOSIS — R7989 Other specified abnormal findings of blood chemistry: Secondary | ICD-10-CM | POA: Diagnosis not present

## 2022-03-26 DIAGNOSIS — R5383 Other fatigue: Secondary | ICD-10-CM

## 2022-03-26 DIAGNOSIS — Z136 Encounter for screening for cardiovascular disorders: Secondary | ICD-10-CM | POA: Diagnosis not present

## 2022-03-27 LAB — COMPLETE METABOLIC PANEL WITH GFR
AG Ratio: 1.4 (calc) (ref 1.0–2.5)
ALT: 19 U/L (ref 6–29)
AST: 21 U/L (ref 10–35)
Albumin: 4.6 g/dL (ref 3.6–5.1)
Alkaline phosphatase (APISO): 48 U/L (ref 37–153)
BUN: 17 mg/dL (ref 7–25)
CO2: 29 mmol/L (ref 20–32)
Calcium: 10 mg/dL (ref 8.6–10.4)
Chloride: 103 mmol/L (ref 98–110)
Creat: 0.87 mg/dL (ref 0.60–0.95)
Globulin: 3.2 g/dL (calc) (ref 1.9–3.7)
Glucose, Bld: 88 mg/dL (ref 65–99)
Potassium: 5 mmol/L (ref 3.5–5.3)
Sodium: 140 mmol/L (ref 135–146)
Total Bilirubin: 0.6 mg/dL (ref 0.2–1.2)
Total Protein: 7.8 g/dL (ref 6.1–8.1)
eGFR: 66 mL/min/{1.73_m2} (ref >=60)

## 2022-03-27 LAB — CBC WITH DIFFERENTIAL/PLATELET
Absolute Monocytes: 515 cells/uL (ref 200–950)
Basophils Absolute: 40 cells/uL (ref 0–200)
Basophils Relative: 0.6 %
Eosinophils Absolute: 475 cells/uL (ref 15–500)
Eosinophils Relative: 7.2 %
HCT: 41.6 % (ref 35.0–45.0)
Hemoglobin: 14.2 g/dL (ref 11.7–15.5)
Lymphs Abs: 2858 cells/uL (ref 850–3900)
MCH: 34.1 pg — ABNORMAL HIGH (ref 27.0–33.0)
MCHC: 34.1 g/dL (ref 32.0–36.0)
MCV: 99.8 fL (ref 80.0–100.0)
MPV: 9.6 fL (ref 7.5–12.5)
Monocytes Relative: 7.8 %
Neutro Abs: 2713 cells/uL (ref 1500–7800)
Neutrophils Relative %: 41.1 %
Platelets: 215 10*3/uL (ref 140–400)
RBC: 4.17 10*6/uL (ref 3.80–5.10)
RDW: 12.4 % (ref 11.0–15.0)
Total Lymphocyte: 43.3 %
WBC: 6.6 10*3/uL (ref 3.8–10.8)

## 2022-03-27 LAB — LIPID PANEL
Cholesterol: 235 mg/dL — ABNORMAL HIGH (ref <200)
HDL: 121 mg/dL (ref >=50)
LDL Cholesterol (Calc): 96 mg/dL (calc)
Non-HDL Cholesterol (Calc): 114 mg/dL (calc) (ref <130)
Total CHOL/HDL Ratio: 1.9 (calc) (ref <5.0)
Triglycerides: 87 mg/dL (ref <150)

## 2022-03-27 LAB — TSH: TSH: 3.3 mIU/L (ref 0.40–4.50)

## 2022-03-28 ENCOUNTER — Other Ambulatory Visit: Payer: Self-pay | Admitting: Cardiology

## 2022-03-30 ENCOUNTER — Ambulatory Visit (INDEPENDENT_AMBULATORY_CARE_PROVIDER_SITE_OTHER): Payer: Medicare PPO | Admitting: Internal Medicine

## 2022-03-30 ENCOUNTER — Encounter: Payer: Self-pay | Admitting: Internal Medicine

## 2022-03-30 VITALS — BP 104/68 | HR 80 | Temp 98.2°F | Ht 64.0 in | Wt 119.4 lb

## 2022-03-30 DIAGNOSIS — M8580 Other specified disorders of bone density and structure, unspecified site: Secondary | ICD-10-CM

## 2022-03-30 DIAGNOSIS — Z8739 Personal history of other diseases of the musculoskeletal system and connective tissue: Secondary | ICD-10-CM

## 2022-03-30 DIAGNOSIS — H903 Sensorineural hearing loss, bilateral: Secondary | ICD-10-CM | POA: Diagnosis not present

## 2022-03-30 DIAGNOSIS — K52832 Lymphocytic colitis: Secondary | ICD-10-CM

## 2022-03-30 DIAGNOSIS — R7989 Other specified abnormal findings of blood chemistry: Secondary | ICD-10-CM | POA: Diagnosis not present

## 2022-03-30 DIAGNOSIS — Z0000 Encounter for general adult medical examination without abnormal findings: Secondary | ICD-10-CM | POA: Diagnosis not present

## 2022-03-30 DIAGNOSIS — Z7901 Long term (current) use of anticoagulants: Secondary | ICD-10-CM

## 2022-03-30 DIAGNOSIS — I4821 Permanent atrial fibrillation: Secondary | ICD-10-CM

## 2022-03-30 NOTE — Progress Notes (Signed)
Annual Wellness Visit     Patient: [name], Female    DOB: December 16, 1940, 81 y.o.   MRN: [id number] Visit Date: 03/30/2022   Subjective    [name] is a 81 y.o. female who presents today for her Annual Wellness Visit.  HPI She also presents for health maintenance exam and evaluation of medical issues.  She has a history of rheumatoid arthritis treated by [facility] rheumatology.  History of irritable bowel syndrome and lymphocytic colitis evaluated in the past by Dr. Carlean Purl.  History of GE reflux.  History of hearing loss and tinnitus.  Had diagnosis of new onset atrial fibrillation in November 2020.  Had cardioversion in January 2021 and subsequently went back into atrial fibrillation.  Rate was controlled.  History of left atrial size severely dilated and right atrial size mildly dilated based on 2D echocardiogram December 2020.  CHADS2 score was 30.  She and her husband decided they would pursue rate control after consultation with cardiologist.  In January 2015 she had surgery by Dr. 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  Remote history of plantar fasciitis.  History of environmental allergies.  Social history: She has a Education officer, community.  1 adult son who lives in [place].  Social alcohol consumption.  Does not smoke.  Family history: Father died at age 57 with complications of COPD.  Mother died at age 9 of heart failure.  No brothers or sisters.     Social History   Social History Narrative   Health Care POA: Husband, Fred   Emergency Contact: Fred   End of Life Plan:    Who lives with you: husband   Son, in [place]   Any pets: 3 cats   Diet: Pt has a varied diet of protein, starch, and vegetables.   Exercise: Pt works with trainer 2x a week for 60 minutes    Seatbelts: Pt reports wearing seatbelt when in vehicle.   Sun Exposure/Protection: Pt reports not using sun protection.   Hobbies: gardening, reading, concerts                Patient Care Team: Dalal Livengood, Cresenciano Lick, MD as PCP - General (Internal Medicine) Stefanie Libel, MD (Family Medicine) Teena Irani, MD (Inactive) (Gastroenterology) Shon Hough, MD (Ophthalmology) Everett Graff, MD (Obstetrics and Gynecology) Lindwood Coke, MD (Dermatology) Earley Favor (Dental General Practice)  Review of Systems no new complaints   Objective    Vitals: Blood pressure 104/68, pulse 80, temperature 98.2 degrees pulse oximetry 98% weight 119 pounds 6.4 ounces height 5 feet 4 inches  Physical Exam  Skin: Warm and dry.  No cervical adenopathy.  No carotid bruits.  Chest clear to auscultation without rales or wheezing.  Cardiac exam: Regular rate and rhythm without ectopy.  Abdomen is soft nondistended without hepatosplenomegaly masses or tenderness.  Pelvic exam deferred.  No lower extremity pitting edema.  Brief neurological exam is intact without gross focal deficits.   Most recent functional status assessment:    03/30/2022   11:11 AM  In your present state of health, do you have any difficulty performing the following activities:  Hearing? 1  Vision? 1  Difficulty concentrating or making decisions? 1  Walking or climbing stairs? 0  Dressing or bathing? 0  Doing errands, shopping? 0  Preparing Food and eating ? N  Using the Toilet? N  In the past six months, have you accidently leaked urine? Y  Do you have problems with loss of bowel control? N  Managing your Medications? N  Managing your Finances? N  Housekeeping or managing your Housekeeping? N   Most recent fall risk assessment:    03/30/2022   11:09 AM  Fall Risk   Falls in the past year? 1  Number falls in past yr: 0  Injury with Fall? 0  Risk for fall due to : No Fall Risks  Follow up Falls prevention discussed     Most recent depression screenings:    03/30/2022   11:09 AM 03/20/2021   11:27 AM  PHQ 2/9 Scores  PHQ - 2 Score 0 0   Most recent cognitive screening:    03/30/2022   11:13 AM  6CIT Screen  What Year? 0 points  What month? 0 points  What time? 0 points  Count back from 20 0 points  Months in reverse 0 points  Repeat phrase 0 points  Total Score 0 points       Assessment & Plan   History of rheumatoid arthritis followed by [facility] Rheumatology  Hyperlipidemia-stable on Lipitor  Atrial fibrillation treated with Eliquis and metoprolol  Lymphocytic colitis/irritable bowel syndrome treated by Dr. Carlean Purl  High HDL cholesterol of 121  History of hearing loss and tinnitus  Mild osteopenia  Plan: Labs reviewed and are stable.  No change in medications.  Return in 1 year or as needed.  Immunizations discussed.  She and her husband are planning to move to [place] in the near future.      Annual wellness visit done today including the all of the following: Reviewed patient's Family Medical History Reviewed and updated list of patient's medical providers Assessment of cognitive impairment was done Assessed patient's functional ability Established a written schedule for health screening [place] Completed and Reviewed  Discussed health benefits of physical activity, and encouraged her to engage in regular exercise appropriate for her age and condition.         {I, [name], MD, have reviewed all documentation for this visit. The documentation on 03/30/22 for the exam, diagnosis, procedures, and orders are all accurate and complete.   LaVon Barron Alvine, CMA

## 2022-04-03 LAB — POCT URINALYSIS DIPSTICK
Bilirubin, UA: NEGATIVE
Blood, UA: NEGATIVE
Glucose, UA: NEGATIVE
Ketones, UA: NEGATIVE
Leukocytes, UA: NEGATIVE
Nitrite, UA: NEGATIVE
Protein, UA: NEGATIVE
Spec Grav, UA: 1.01 (ref 1.010–1.025)
Urobilinogen, UA: 0.2 E.U./dL
pH, UA: 7 (ref 5.0–8.0)

## 2022-04-04 ENCOUNTER — Telehealth: Payer: Self-pay | Admitting: Internal Medicine

## 2022-04-04 MED ORDER — HYDROCORTISONE 1 % EX CREA
1.0000 | TOPICAL_CREAM | Freq: Every day | CUTANEOUS | 1 refills | Status: AC | PRN
  Filled 2022-06-07: qty 28.35, 18d supply, fill #0

## 2022-04-04 NOTE — Telephone Encounter (Signed)
Shannah Conteh [phone]  Lynasia called to ask about a cortisone salve being called in for itch she is having on her face that you guys talked about at her previous office visit. She did not know the name of it.   [id number] [phone] Fax: [phone]

## 2022-04-05 NOTE — Telephone Encounter (Signed)
Rx was sent to the pharmacy on 04/04/22 spoke with pharmacy staff and was told Rx is being processed and patient will be notified when ready for pick up

## 2022-04-11 ENCOUNTER — Encounter: Payer: Self-pay | Admitting: Cardiology

## 2022-04-11 ENCOUNTER — Ambulatory Visit: Payer: Medicare PPO | Attending: Cardiology | Admitting: Cardiology

## 2022-04-11 VITALS — BP 110/70 | HR 89 | Ht 64.0 in | Wt 120.6 lb

## 2022-04-11 DIAGNOSIS — E785 Hyperlipidemia, unspecified: Secondary | ICD-10-CM | POA: Diagnosis not present

## 2022-04-11 DIAGNOSIS — R911 Solitary pulmonary nodule: Secondary | ICD-10-CM

## 2022-04-11 DIAGNOSIS — I4821 Permanent atrial fibrillation: Secondary | ICD-10-CM | POA: Diagnosis not present

## 2022-04-11 NOTE — Progress Notes (Signed)
Cardiology Office Note:    Date:  04/11/2022   ID:  [name], DOB 1940/07/27, MRN [id number]  PCP:  [name], MD  Cardiologist:  None  Electrophysiologist:  None   Referring MD: [name], MD   Chief Complaint  Patient presents with   Atrial Fibrillation    History of Present Illness:    [name] is a 81 y.o. female with a hx of  hyperlipidemia, rheumatoid arthritis, lymphocytic colitis, GERD who presents for follow-up.  She was initially seen on 04/03/2019, had been referred by Dr. Renold Genta for evaluation of new onset atrial fibrillation.  At initial clinic visit, she was noted to be in AF with rates 110s, was started on metoprolol 25 mg twice daily for rate control.  She was started on Eliquis 2.5 mg twice daily for anticoagulation.  TTE on 04/30/2019 showed EF 60 to 65%, normal RV function, severe left atrial dilatation, mild right atrial dilatation, mild MR, mild AI.  On 05/11/2019, she underwent a successful DCCV to restore sinus rhythm.  Following cardioversion, she reported that her heart rate would fall to the 40s, metoprolol dose was decreased to 12.5 mg twice daily.  At follow-up clinic on 07/03/2019, she was noted to be back in atrial fibrillation.  She was referred to the A. fib clinic, and rhythm control options were discussed but patient elected to continue with rate control strategy.  Zio patch x3 days on 12/02/2019 showed 100s in AF burden with average rate 78 bpm.  Calcium score 121 (50th percentile) on 01/09/2021.  Since her last clinic visit, she reports she is doing okay.  Has been under stress recently, as she is moving to a new home.  Has been having some dizziness when she stands but denies any syncope.  Reports improved with drinking more water.  She denies any chest pain, dyspnea, or palpitations.  Does report occasional lower extremity edema.  She is working out twice a week with Physiological scientist.  Denies any bleeding issues on Eliquis.   Wt  Readings from Last 3 Encounters:  04/11/22 120 lb 9.6 oz (54.7 kg)  03/30/22 119 lb 6.4 oz (54.2 kg)  03/20/22 114 lb (51.7 kg)     Past Medical History:  Diagnosis Date   A-fib ([place])    Allergy    seasonal   Anemia    Cataract    bil cateracts removed   Chronic constipation    Diarrhea    chronic diarrheafor over 2 months   GERD (gastroesophageal reflux disease)    Hyperlipidemia    Lymphocytic colitis w/ patchy collagen deposition 09/28/2016   Rheumatoid arthritis ([place])    Status post dilation of esophageal narrowing 2014   Wears glasses     Past Surgical History:  Procedure Laterality Date   CARDIOVERSION N/A 05/11/2019   Procedure: CARDIOVERSION;  Surgeon: Buford Dresser, MD;  Location: [place];  Service: Cardiovascular;  Laterality: N/A;   CATARACT EXTRACTION  06/30/2016 -right eye   left eye in 08/15/16   COLONOSCOPY     DILATION AND CURETTAGE OF UTERUS  2009   ESOPHAGEAL DILATION  05/31/1997   REPAIR EXTENSOR TENDON Right 05/14/2013   Procedure: LONG FINGER EXTENSOR TO ELEVATE RING AND SMALL FINGERS ;  Surgeon: Cammie Sickle., MD;  Location: [place];  Service: Orthopedics;  Laterality: Right;   seborrheic keratosis  09/03/12   inflamed, excised from forehead   TENDON TRANSFER Right 09/03/2013   Procedure: RIGHT  HAND FLEXOR CARPI RADIALUS TRANSFER TO LONG/RING/SMALL;  Surgeon: Cammie Sickle., MD;  Location: [place];  Service: Orthopedics;  Laterality: Right;   TONSILLECTOMY     TUBAL LIGATION     ULNAR HEAD EXCISION Right 05/14/2013   Procedure: ARTHOPLASTY DISTAL RADIAL ULNAR JOINT, RIGHT ;  Surgeon: Cammie Sickle., MD;  Location: [place];  Service: Orthopedics;  Laterality: Right;   UPPER GASTROINTESTINAL ENDOSCOPY      Current Medications: Current Meds  Medication Sig   acetaminophen (TYLENOL) 500 MG tablet Take 500-1,000 mg by mouth every 8 (eight) hours as needed for moderate pain or  headache.    atorvastatin (LIPITOR) 20 MG tablet TAKE 1 TABLET BY MOUTH 3 TIMES A WEEK.   calcium carbonate (TUMS EX) 750 MG chewable tablet Chew 1,500 mg by mouth 2 (two) times daily.    diphenoxylate-atropine (LOMOTIL) 2.5-0.025 MG tablet TAKE 2 TABLETS BY MOUTH 4 (FOUR) TIMES DAILY AS NEEDED FOR DIARRHEA OR LOOSE STOOLS. MAXIMUM 8 TABLETS DAILY   ELIQUIS 2.5 MG TABS tablet TAKE 1 TABLET BY MOUTH TWICE A DAY   esomeprazole (NEXIUM) 40 MG capsule TAKE 1 CAPSULE(40 MG) BY MOUTH DAILY BEFORE A MEAL   finasteride (PROSCAR) 5 MG tablet TAKE 1 TABLET EVERY DAY (Patient taking differently: Half)   hydrocortisone cream 1 % Apply 1 Application topically daily as needed for itching (burns).   InFLIXimab (REMICADE IV) Inject 1 Dose into the vein See admin instructions. Remicade Infusion every 2 months   Ivermectin 1 % CREA 1 application to affected area   lactase (LACTAID) 3000 units tablet 1 tablet   loratadine (CLARITIN) 10 MG tablet 1 tablet   metoprolol succinate (TOPROL-XL) 25 MG 24 hr tablet Take 1 tablet (25 mg total) by mouth daily.   minoxidil (ROGAINE) 2 % external solution Apply 1 application topically 2 (two) times daily.   Multiple Vitamin (MULTIVITAMIN) tablet Take 1 tablet by mouth daily.   neomycin-bacitracin-polymyxin (NEOSPORIN) ointment Apply 1 application topically as needed for wound care.   omeprazole (PRILOSEC) 20 MG capsule Take by mouth.     Allergies:   Aspirin, Cox-2 inhibitors, Glucose, Lactose intolerance (gi), Leflunomide, Methotrexate, Oxycodone-acetaminophen, Rosuvastatin, and Sulfa antibiotics   Social History   Socioeconomic History   Marital status: Married    Spouse name: [name]   Number of children: 1   Years of education: 16   Highest education level: Not on file  Occupational History   Occupation: Retired- Scientist, research (physical sciences): RETIRED  Tobacco Use   Smoking status: Never   Smokeless tobacco: Never  Vaping Use   Vaping Use: Never used  Substance and  Sexual Activity   Alcohol use: Yes    Alcohol/week: 21.0 standard drinks of alcohol    Types: 21 Standard drinks or equivalent per week    Comment: 3 times weekly   Drug use: No   Sexual activity: Not on file  Other Topics Concern   Not on file  Social History Narrative   Health Care POA: Husband, Fred   Emergency Contact: Wilfrid Lund of Life Plan:    Who lives with you: husband   Son, in [place]   Any pets: 3 cats   Diet: Pt has a varied diet of protein, starch, and vegetables.   Exercise: Pt works with trainer 2x a week for 60 minutes   Seatbelts: Pt reports wearing seatbelt when in vehicle.   Nancy Fetter Exposure/Protection: Pt reports not using  sun protection.   Hobbies: gardening, reading, concerts               Social Determinants of Health   Financial Resource Strain: Not on file  Food Insecurity: Not on file  Transportation Needs: Not on file  Physical Activity: Not on file  Stress: Not on file  Social Connections: Not on file     Family History: The patient's family history includes COPD in her father; Colon cancer in her paternal uncle; Heart disease in her mother; Osteoporosis in her maternal aunt, maternal grandmother, and mother. There is no history of Esophageal cancer, Pancreatic cancer, Rectal cancer, or Stomach cancer.  ROS:   Please see the history of present illness.    A whole 8 I get asked the minimum of once a day bowel dialysis I assessment all other systems reviewed and are negative.  EKGs/Labs/Other Studies Reviewed:    The following studies were reviewed today:   EKG:   10/24/2021: Atrial fibrillation, rate 83 04/11/22: Afib, rate 89  Recent Labs: 03/26/2022: ALT 19; BUN 17; Creat 0.87; Hemoglobin 14.2; Platelets 215; Potassium 5.0; Sodium 140; TSH 3.30  Recent Lipid Panel    Component Value Date/Time   CHOL 235 (H) 03/26/2022 0950   TRIG 87 03/26/2022 0950   HDL 121 03/26/2022 0950   CHOLHDL 1.9 03/26/2022 0950   VLDL 19 08/03/2016 1001    LDLCALC 96 03/26/2022 0950   LDLDIRECT 149 (H) 11/11/2007 2043   TTE 04/30/19: 1. Left ventricular ejection fraction, by visual estimation, is 60 to  65%. The left ventricle has normal function. Left ventricular septal wall  thickness was normal. Normal left ventricular posterior wall thickness.  There is no left ventricular  hypertrophy.   2. Left ventricular diastolic parameters are indeterminate.   3. The left ventricle has no regional wall motion abnormalities.   4. Global right ventricle has normal systolic function.The right  ventricular size is normal. No increase in right ventricular wall  thickness.   5. Left atrial size was severely dilated.   6. Right atrial size was mildly dilated.   7. The mitral valve is normal in structure. Mild mitral valve  regurgitation. No evidence of mitral stenosis.   8. The tricuspid valve is normal in structure.   9. The aortic valve is tricuspid. Aortic valve regurgitation is mild. No  evidence of aortic valve sclerosis or stenosis.  10. The pulmonic valve was normal in structure. Pulmonic valve  regurgitation is not visualized.  11. Normal pulmonary artery systolic pressure.  12. The inferior vena cava is dilated in size with >50% respiratory  variability, suggesting right atrial pressure of 8 mmHg.   Physical Exam:    VS:  BP 110/70 (BP Location: Left Arm, Patient Position: Sitting, Cuff Size: Normal)   Pulse 89   Ht 5' 4"  (1.626 m)   Wt 120 lb 9.6 oz (54.7 kg)   SpO2 97%   BMI 20.70 kg/m     Wt Readings from Last 3 Encounters:  04/11/22 120 lb 9.6 oz (54.7 kg)  03/30/22 119 lb 6.4 oz (54.2 kg)  03/20/22 114 lb (51.7 kg)     GEN: in no acute distress HEENT: Normal NECK: No JVD CARDIAC: irregular,  no murmurs, rubs, gallops RESPIRATORY:  Clear to auscultation without rales, wheezing or rhonchi  ABDOMEN: Soft, non-tender, non-distended MUSCULOSKELETAL:  No edema; No deformity  SKIN: Warm and dry NEUROLOGIC:  Alert and  oriented x 3 PSYCHIATRIC:  Normal affect   ASSESSMENT:  1. Permanent atrial fibrillation ([place])   2. Hyperlipidemia, unspecified hyperlipidemia type   3. Lung nodule     PLAN:     Permanent atrial fibrillation: CHADS-VASc score 3 (agex2, female).  Appears rate controlled on metoprolol.  Successful DCCV on 05/11/2019, however was back in atrial fibrillation at follow-up appointment on 07/03/2019.  TTE shows normal LV systolic function, severe left atrial dilatation.  She was seen in AF clinic and decided to pursue rate control strategy as rates well controlled and appears asymptomatic.  Zio patch x3 days on 12/02/2019 showed 100% AF burden with average rate 78 bpm. -Continue Eliquis.  Given age>80 and weight <60kg, meets indication for reduced dose.  Will continue 2.5 mg twice daily -Continue Toprol-XL 25 mg daily  -Update echocardiogram  Hyperlipidemia: LDL 118 05/2199, she started on rosuvastatin 5 mg three times weekly and LDL improved to 59 02/2020.  She stopped taking the rosuvastatin.  Calcium score 121 (50th percentile) on 01/09/2021.  Restarted atorvastatin 20 mg 3 times weekly.  LDL 96 on 03/26/2022.  Also with excellent HDL (121).  Pulmonary nodules: Bilateral small pulmonary nodules noted on calcium score 12/2020.  Stable on CT chest 12/2021\  RTC in 6 months   Medication Adjustments/Labs and Tests Ordered: Current medicines are reviewed at length with the patient today.  Concerns regarding medicines are outlined above.  Orders Placed This Encounter  Procedures   EKG 12-Lead   ECHOCARDIOGRAM COMPLETE    No orders of the defined types were placed in this encounter.    Patient Instructions  Medication Instructions:  Your physician recommends that you continue on your current medications as directed. Please refer to the Current Medication list given to you today.  *If you need a refill on your cardiac medications before your next appointment, please call your  pharmacy*  Testing/Procedures: Your physician has requested that you have an echocardiogram in 6 MONTHS. Echocardiography is a painless test that uses sound waves to create images of your heart. It provides your doctor with information about the size and shape of your heart and how well your heart's chambers and valves are working. This procedure takes approximately one hour. There are no restrictions for this procedure. Please do NOT wear cologne, perfume, aftershave, or lotions (deodorant is allowed). Please arrive 15 minutes prior to your appointment time.   Follow-Up: At [facility], you and your health needs are our priority.  As part of our continuing mission to provide you with exceptional heart care, we have created designated Provider Care Teams.  These Care Teams include your primary Cardiologist (physician) and Advanced Practice Providers (APPs -  Physician Assistants and Nurse Practitioners) who all work together to provide you with the care you need, when you need it.  We recommend signing up for the patient portal called "MyChart".  Sign up information is provided on this After Visit Summary.  MyChart is used to connect with patients for Virtual Visits (Telemedicine).  Patients are able to view lab/test results, encounter notes, upcoming appointments, etc.  Non-urgent messages can be sent to your provider as well.   To learn more about what you can do with MyChart, go to [email or web address].    Your next appointment:   6 month(s)  The format for your next appointment:   In Person  Provider:   Dr. Gardiner Rhyme         Signed, Donato Heinz, MD  04/11/2022 5:19 PM    [place]

## 2022-04-11 NOTE — Patient Instructions (Signed)
Medication Instructions:  Your physician recommends that you continue on your current medications as directed. Please refer to the Current Medication list given to you today.  *If you need a refill on your cardiac medications before your next appointment, please call your pharmacy*  Testing/Procedures: Your physician has requested that you have an echocardiogram in 6 MONTHS. Echocardiography is a painless test that uses sound waves to create images of your heart. It provides your doctor with information about the size and shape of your heart and how well your heart's chambers and valves are working. This procedure takes approximately one hour. There are no restrictions for this procedure. Please do NOT wear cologne, perfume, aftershave, or lotions (deodorant is allowed). Please arrive 15 minutes prior to your appointment time.   Follow-Up: At [facility], you and your health needs are our priority.  As part of our continuing mission to provide you with exceptional heart care, we have created designated Provider Care Teams.  These Care Teams include your primary Cardiologist (physician) and Advanced Practice Providers (APPs -  Physician Assistants and Nurse Practitioners) who all work together to provide you with the care you need, when you need it.  We recommend signing up for the patient portal called "MyChart".  Sign up information is provided on this After Visit Summary.  MyChart is used to connect with patients for Virtual Visits (Telemedicine).  Patients are able to view lab/test results, encounter notes, upcoming appointments, etc.  Non-urgent messages can be sent to your provider as well.   To learn more about what you can do with MyChart, go to [email or web address].    Your next appointment:   6 month(s)  The format for your next appointment:   In Person  Provider:   Dr. Gardiner Rhyme

## 2022-04-24 ENCOUNTER — Encounter: Payer: Self-pay | Admitting: Internal Medicine

## 2022-04-24 NOTE — Patient Instructions (Addendum)
It was a pleasure to see you today. RTC in one year or as needed.  No change in medications.

## 2022-05-04 ENCOUNTER — Telehealth: Payer: Self-pay

## 2022-05-04 NOTE — Telephone Encounter (Signed)
Patient called asking for some assists with getting Fred's body released to [place] she said it is ok to call her to get more information

## 2022-05-07 DIAGNOSIS — M0609 Rheumatoid arthritis without rheumatoid factor, multiple sites: Secondary | ICD-10-CM | POA: Diagnosis not present

## 2022-05-08 ENCOUNTER — Encounter: Payer: Self-pay | Admitting: Internal Medicine

## 2022-05-14 ENCOUNTER — Telehealth: Payer: Self-pay

## 2022-05-14 DIAGNOSIS — R931 Abnormal findings on diagnostic imaging of heart and coronary circulation: Secondary | ICD-10-CM

## 2022-05-14 NOTE — Telephone Encounter (Signed)
Pharmacy please advise on holding Eliquis prior to dental extraction with placing new implant scheduled for TBD. Thank you.    Ambrose Pancoast, NP

## 2022-05-14 NOTE — Telephone Encounter (Signed)
   Pre-operative Risk Assessment    Patient Name: [name]  DOB: 05/10/1940 MRN: [id number]     Request for Surgical Clearance    Procedure:  Dental Extraction - Amount of Teeth to be Pulled:  Tooth extraction   Date of Surgery:  Clearance TBD                                 Surgeon:  Dr Lyndel Safe Mahorn D.D.S Surgeon's Group or Practice Name:  [facility] Phone number:  [phone] Fax number:  [phone]   Type of Clearance Requested:   - Pharmacy:  Hold Apixaban (Eliquis) and takes NSAIDS POST OP   Type of Anesthesia:  Local    Additional requests/questions:  Please advise surgeon/provider what medications should be held. Please fax a copy of cardiac clearance to the surgeon's office.  Signed, Jeanmarie Plant Cassandr Cederberg  CCMA 05/14/2022, 3:00 PM

## 2022-05-14 NOTE — Telephone Encounter (Signed)
Patient informed of message and will call back to schedule lab appt.

## 2022-05-14 NOTE — Telephone Encounter (Signed)
Spoke with Dr Jeremy Johann office and the receptionist stated the dentist will be extracting 1 tooth and placing an implant.

## 2022-05-14 NOTE — Telephone Encounter (Addendum)
Patient called asking what her Vitamin D levels are because she had some dental work done and was told her levels should be 40 or above.  Last Vit D lab was 50 in 08/03/2016.

## 2022-05-14 NOTE — Telephone Encounter (Signed)
Preop call back team please contact requesting provider office for more information regarding how many teeth are being extracted.  Please let me know if you have any additional questions.  Ambrose Pancoast, NP

## 2022-05-15 ENCOUNTER — Other Ambulatory Visit: Payer: Medicare PPO

## 2022-05-15 DIAGNOSIS — R931 Abnormal findings on diagnostic imaging of heart and coronary circulation: Secondary | ICD-10-CM | POA: Insufficient documentation

## 2022-05-15 DIAGNOSIS — Z1321 Encounter for screening for nutritional disorder: Secondary | ICD-10-CM

## 2022-05-15 DIAGNOSIS — M8580 Other specified disorders of bone density and structure, unspecified site: Secondary | ICD-10-CM

## 2022-05-15 NOTE — Telephone Encounter (Signed)
   Patient Name: [name]  DOB: March 17, 1941 MRN: [id number]  Primary Cardiologist: None  Clinical pharmacists have reviewed the patient's past medical history, labs, and current medications as part of preoperative protocol coverage. The following recommendations have been made:   CHA2DS2-VASc Score = 4  This indicates a 4.8% annual risk of stroke. The patient's score is based upon: CHF History: 0 HTN History: 0 Diabetes History: 0 Stroke History: 0 Vascular Disease History: 1 Age Score: 2 Gender Score: 1   CrCl 9mL/min Platelet count 215K   Patient does not require pre-op antibiotics for dental procedure.   Per office protocol, patient can hold Eliquis for 1 day prior to procedure.     I will route this recommendation to the requesting party via Epic fax function and remove from pre-op pool.  Please call with questions.  Mable Fill, Marissa Nestle, NP 05/15/2022, 8:39 AM

## 2022-05-15 NOTE — Telephone Encounter (Signed)
Patient with diagnosis of afib on Eliquis for anticoagulation.    Procedure: 1 dental extraction and 1 implant Date of procedure: TBD  CHA2DS2-VASc Score = 4  This indicates a 4.8% annual risk of stroke. The patient's score is based upon: CHF History: 0 HTN History: 0 Diabetes History: 0 Stroke History: 0 Vascular Disease History: 1 Age Score: 2 Gender Score: 1   CrCl 58mL/min Platelet count 215K  Patient does not require pre-op antibiotics for dental procedure.  Per office protocol, patient can hold Eliquis for 1 day prior to procedure.    **This guidance is not considered finalized until pre-operative APP has relayed final recommendations.**

## 2022-05-15 NOTE — Telephone Encounter (Signed)
[name] called back and scheduled to come in today for her Vit D level labs, once they are back we need to fax them to   [facility] Phone [phone] Fax [phone]

## 2022-05-16 LAB — VITAMIN D 25 HYDROXY (VIT D DEFICIENCY, FRACTURES): Vit D, 25-Hydroxy: 47 ng/mL (ref 30–100)

## 2022-05-16 NOTE — Telephone Encounter (Signed)
Lab results faxed to [facility]

## 2022-05-19 ENCOUNTER — Other Ambulatory Visit: Payer: Self-pay | Admitting: Internal Medicine

## 2022-05-31 HISTORY — PX: TOOTH EXTRACTION: SUR596

## 2022-06-06 ENCOUNTER — Other Ambulatory Visit (HOSPITAL_BASED_OUTPATIENT_CLINIC_OR_DEPARTMENT_OTHER): Payer: Self-pay

## 2022-06-07 ENCOUNTER — Other Ambulatory Visit (HOSPITAL_BASED_OUTPATIENT_CLINIC_OR_DEPARTMENT_OTHER): Payer: Self-pay

## 2022-06-07 MED ORDER — FINASTERIDE 5 MG PO TABS
5.0000 mg | ORAL_TABLET | Freq: Every day | ORAL | 2 refills | Status: DC
  Filled ????-??-??: fill #0

## 2022-06-07 MED ORDER — CLOBETASOL PROPIONATE 0.05 % EX SHAM: 1.0000 | MEDICATED_SHAMPOO | Freq: Every day | CUTANEOUS | 0 refills | Status: DC | PRN

## 2022-06-07 MED ORDER — NAPROXEN 500 MG PO TABS
500.0000 mg | ORAL_TABLET | Freq: Two times a day (BID) | ORAL | 0 refills | Status: DC
  Filled 2022-06-07: qty 10, 5d supply, fill #0

## 2022-06-07 MED FILL — Esomeprazole Magnesium Cap Delayed Release 40 MG (Base Eq): ORAL | 90 days supply | Qty: 90 | Fill #0 | Status: CN

## 2022-06-07 MED FILL — Apixaban Tab 2.5 MG: ORAL | 90 days supply | Qty: 180 | Fill #0 | Status: CN

## 2022-06-11 ENCOUNTER — Other Ambulatory Visit: Payer: Self-pay

## 2022-06-11 ENCOUNTER — Other Ambulatory Visit (HOSPITAL_BASED_OUTPATIENT_CLINIC_OR_DEPARTMENT_OTHER): Payer: Self-pay

## 2022-06-11 MED FILL — Apixaban Tab 2.5 MG: ORAL | 90 days supply | Qty: 180 | Fill #0 | Status: AC

## 2022-06-23 ENCOUNTER — Other Ambulatory Visit: Payer: Self-pay | Admitting: Cardiology

## 2022-07-02 ENCOUNTER — Ambulatory Visit: Payer: Medicare PPO | Admitting: Podiatry

## 2022-07-02 ENCOUNTER — Encounter: Payer: Self-pay | Admitting: Podiatry

## 2022-07-02 VITALS — BP 121/69

## 2022-07-02 DIAGNOSIS — B351 Tinea unguium: Secondary | ICD-10-CM

## 2022-07-02 DIAGNOSIS — M79674 Pain in right toe(s): Secondary | ICD-10-CM | POA: Diagnosis not present

## 2022-07-02 DIAGNOSIS — M79675 Pain in left toe(s): Secondary | ICD-10-CM

## 2022-07-02 NOTE — Progress Notes (Unsigned)
  Subjective:  Patient ID: [name], female    DOB: April 11, 1941,  MRN: [id number]  [name] presents to clinic today for {jgcomplaint:23593}  Chief Complaint  Patient presents with   Nail Problem    [facility] PCP-[facility] PCP VST- Fall 2023   New problem(s): None. {jgcomplaint:23593}  PCP is Baxley, Cresenciano Lick, MD.  Allergies  Allergen Reactions   Aspirin     Other reaction(s): GI upset   Cox-2 Inhibitors Nausea And Vomiting    Severe abd pain   Glucose Diarrhea    cramps   Lactose Intolerance (Gi) Diarrhea    Stomach cramps   Leflunomide     Other reaction(s): hairloss   Methotrexate     Other reaction(s): hairloss   Oxycodone-Acetaminophen     Other reaction(s): Unknown   Rosuvastatin Diarrhea   Sulfa Antibiotics     Unknown as child    Review of Systems: Negative except as noted in the HPI.  Objective: No changes noted in today's physical examination. There were no vitals filed for this visit.  [name] is a pleasant 82 y.o. female thin build in NAD. AAO x 3.  Vascular Examination: Capillary refill time <3 seconds b/l LE. Palpable pedal pulses b/l LE. Digital hair sparse b/l. No pedal edema b/l. Skin temperature gradient WNL b/l. Marland Kitchen  Dermatological Examination: Pedal skin with normal turgor, texture and tone b/l. No open wounds. No interdigital macerations b/l. Toenails 1-5 b/l thickened, discolored, dystrophic with subungual debris. There is pain on palpation to dorsal aspect of nailplates. No hyperkeratotic nor porokeratotic lesions present on today's visit.  Neurological Examination: Protective sensation intact with 10 gram monofilament b/l LE. Vibratory sensation intact b/l LE.   Musculoskeletal Examination: Muscle strength 5/5 to all LE muscle groups b/l. Hammertoe deformity noted 2-5 b/l. Patient ambulates independent of any assistive aids.  Assessment/Plan: 1. Pain due to onychomycosis of toenails of both feet     No orders of the defined  types were placed in this encounter.   None {Jgplan:23602::"-Patient/POA to call should there be question/concern in the interim."}   Return in about 3 months (around 10/02/2022).  Marzetta Board, DPM

## 2022-07-10 ENCOUNTER — Other Ambulatory Visit: Payer: Self-pay | Admitting: Internal Medicine

## 2022-07-10 NOTE — Telephone Encounter (Signed)
Please advise Sir, thank you. 

## 2022-07-11 DIAGNOSIS — M0609 Rheumatoid arthritis without rheumatoid factor, multiple sites: Secondary | ICD-10-CM | POA: Diagnosis not present

## 2022-07-16 ENCOUNTER — Telehealth: Payer: Self-pay | Admitting: Internal Medicine

## 2022-07-16 ENCOUNTER — Encounter: Payer: Self-pay | Admitting: Internal Medicine

## 2022-07-16 ENCOUNTER — Ambulatory Visit: Payer: Medicare PPO | Admitting: Internal Medicine

## 2022-07-16 VITALS — BP 98/52 | HR 67 | Ht 64.0 in | Wt 114.0 lb

## 2022-07-16 DIAGNOSIS — K52832 Lymphocytic colitis: Secondary | ICD-10-CM

## 2022-07-16 DIAGNOSIS — E739 Lactose intolerance, unspecified: Secondary | ICD-10-CM

## 2022-07-16 DIAGNOSIS — K900 Celiac disease: Secondary | ICD-10-CM | POA: Diagnosis not present

## 2022-07-16 DIAGNOSIS — K580 Irritable bowel syndrome with diarrhea: Secondary | ICD-10-CM

## 2022-07-16 HISTORY — DX: Irritable bowel syndrome with diarrhea: K58.0

## 2022-07-16 MED ORDER — ESOMEPRAZOLE MAGNESIUM 40 MG PO CPDR: 40.0000 mg | DELAYED_RELEASE_CAPSULE | Freq: Every day | ORAL | 3 refills | Status: DC

## 2022-07-16 NOTE — Progress Notes (Signed)
[name] 82 y.o. August 10, 1940 [id number]  Assessment & Plan:   Encounter Diagnoses  Name Primary?   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  She is managing at this time we will continue the current treatment with Lomotil 3 every other day and follow-up in 6 to 8 weeks.  I cautioned her to let me know if this gets worse in the interim and I would consider budesonide therapy.  She has requested and we have provided another FODMAPs diet sheet she will work with the dietary at [facility] to try to meet her needs with respect to dietary restriction.  Depending upon clinical course would also consider testing for C. difficile since she had some oral surgery in January but I do not get the sense that that is what is going on in particular given that 3 Lomotil stops all stools for 24 to 36 hours      Subjective:   Gastroenterology summary:  Initial visit [facility] Dr. Carlean Purl 08/09/2016 previously followed by Dr. 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  She also has a history of A-fib and she  is on Eliquis, and is on infliximab for rheumatoid arthritis.  The changes occurred after a very difficult time at the beginning of this year where her husband died, she had to have oral surgery and she sold her house and moved into [facility].  Since moving to [facility] in early February she has had to take 3 Lomotil every other day to control her symptoms of diarrhea.  She has given up eating breakfast and has just coffee.  No abdominal cramps.  Appetite off somewhat.  3 Lomotil every other day allows for her to function and leave her house, and get things done.  No diarrhea in that setting but she cannot go more than 1 day without taking the Lomotil.  When seen last in November 2022 she was using about 3 Lomotil a month and was using a FODMAPs diet with success.  It has been sometime, I think 4 years since she has needed budesonide.  She continues to try to be gluten and lactose-free though she has not yet been able to meet with the dietary supervisor at [place].  Wt Readings from Last 3 Encounters:  07/16/22 114 lb (51.7 kg)  04/11/22 120 lb 9.6 oz (54.7 kg)  03/30/22 119 lb 6.4 oz (54.2 kg)   Colonoscopy 2018  Allergies  Allergen Reactions   Aspirin     Other reaction(s): GI upset   Cox-2 Inhibitors Nausea And Vomiting    Severe abd pain   Glucose Diarrhea    cramps   Lactose Intolerance (Gi) Diarrhea    Stomach cramps  Leflunomide     Other reaction(s): hairloss   Methotrexate     Other reaction(s): hairloss   Oxycodone-Acetaminophen     Other reaction(s): Unknown   Rosuvastatin Diarrhea   Sulfa Antibiotics     Unknown as child   Current Meds  Medication Sig   acetaminophen (TYLENOL) 500 MG tablet Take 500-1,000 mg by mouth every 8 (eight) hours as needed for moderate pain or headache.    apixaban (ELIQUIS) 2.5 MG TABS tablet Take 1 tablet (2.5 mg total) by mouth 2 (two) times daily.   atorvastatin (LIPITOR) 20 MG tablet TAKE 1 TABLET BY MOUTH THREE TIMES A WEEK   calcium  carbonate (TUMS EX) 750 MG chewable tablet Chew 1,500 mg by mouth 2 (two) times daily.    Clobetasol Propionate 0.05 % shampoo Shampoo as directed with 1 application topically once daily as needed.   diphenoxylate-atropine (LOMOTIL) 2.5-0.025 MG tablet TAKE 2 TABLETS BY MOUTH 4 (FOUR) TIMES DAILY AS NEEDED FOR DIARRHEA OR LOOSE STOOLS. MAXIMUM 8 TABLETS DAILY   finasteride (PROSCAR) 5 MG tablet Take 1 tablet (5 mg total) by mouth daily.   hydrocortisone cream 1 % Apply 1 Application topically daily as needed for itching (burns).   InFLIXimab (REMICADE IV) Inject 1 Dose into the vein See admin instructions. Remicade Infusion every 2 months   Ivermectin 1 % CREA 1 application to affected area   lactase (LACTAID) 3000 units tablet 1 tablet   loratadine (CLARITIN) 10 MG tablet 1 tablet   metoprolol succinate (TOPROL-XL) 25 MG 24 hr tablet TAKE 1 TABLET (25 MG TOTAL) BY MOUTH DAILY.   minoxidil (ROGAINE) 2 % external solution Apply 1 application topically 2 (two) times daily.   Multiple Vitamin (MULTIVITAMIN) tablet Take 1 tablet by mouth daily.   naproxen (NAPROSYN) 500 MG tablet Take 1 tablet (500 mg total) by mouth 2 (two) times daily as needed for pain.   neomycin-bacitracin-polymyxin (NEOSPORIN) ointment Apply 1 application topically as needed for wound care.   omeprazole (PRILOSEC) 20 MG capsule Take by mouth.   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 10/16/2017   GERD (gastroesophageal reflux disease)    Hyperlipidemia    Irritable bowel syndrome with diarrhea 07/16/2022   Lactose intolerance 10/16/2017   Lymphocytic colitis w/ patchy collagen deposition 09/28/2016   Rheumatoid arthritis ([place])    Status post dilation of esophageal narrowing 2014   Wears glasses    Past Surgical History:  Procedure Laterality Date   CARDIOVERSION N/A 05/11/2019   Procedure: CARDIOVERSION;   Surgeon: Buford Dresser, MD;  Location: [place];  Service: Cardiovascular;  Laterality: N/A;   CATARACT EXTRACTION  06/30/2016 -right eye   left eye in 08/15/16   COLONOSCOPY     DILATION AND CURETTAGE OF UTERUS  2009   ESOPHAGEAL DILATION  05/31/1997   REPAIR EXTENSOR TENDON Right 05/14/2013   Procedure: LONG FINGER EXTENSOR TO ELEVATE RING AND SMALL FINGERS ;  Surgeon: Cammie Sickle., MD;  Location: [place];  Service: Orthopedics;  Laterality: Right;   seborrheic keratosis  09/03/2012   inflamed, excised from forehead   TENDON TRANSFER Right 09/03/2013   Procedure: RIGHT HAND FLEXOR CARPI RADIALUS TRANSFER TO LONG/RING/SMALL;  Surgeon: Cammie Sickle., MD;  Location: [place];  Service: Orthopedics;  Laterality: Right;   TONSILLECTOMY  TOOTH EXTRACTION  05/2022   TUBAL LIGATION     ULNAR HEAD EXCISION Right 05/14/2013   Procedure: ARTHOPLASTY DISTAL RADIAL ULNAR JOINT, RIGHT ;  Surgeon: Cammie Sickle., MD;  Location: [place];  Service: Orthopedics;  Laterality: Right;   UPPER GASTROINTESTINAL ENDOSCOPY     Social History   Social History Narrative   Health Care POA: Husband, FredEmergency Contact: FredEnd of Life Plan: Who lives with you: husbandSon, in [place] pets: 3 catsDiet: Pt has a varied diet of protein, starch, and vegetables.Exercise: Pt works with trainer 2x a week for 60 minutesSeatbelts: Pt reports wearing seatbelt when in vehicle.Sun Exposure/Protection: Pt reports not using sun protection.Hobbies: gardening, reading, concerts      Widowed [id number] to [place] 05/2022   family history includes COPD in her father; Colon cancer in her paternal uncle; Heart disease in her mother; Osteoporosis in her maternal aunt, maternal grandmother, and mother.   Review of Systems As per HPI  Objective:   Physical Exam @BP  (!) 98/52   Pulse 67   Ht 5\' 4"  (1.626 m)   Wt 114 lb (51.7 kg)    SpO2 98%   BMI 19.57 kg/m @  General:  NAD Eyes:   anicteric Lungs:  clear Abdomen:  soft and nontender, BS+ Ext:   no edema, cyanosis or clubbing    Data Reviewed:  See HPI and I also reviewed cardiology note from June 2023

## 2022-07-16 NOTE — Patient Instructions (Signed)
Continue your Lomotil.  We have provided you with a FODMAP today.   I appreciate the opportunity to care for you. Silvano Rusk, MD, [facility]

## 2022-07-16 NOTE — Telephone Encounter (Signed)
I spoke with [name] and it was the generic Nexium she is on and requested to be refilled not the omeprazole. She was seen today and refill sent in.

## 2022-08-10 ENCOUNTER — Telehealth: Payer: Self-pay | Admitting: Internal Medicine

## 2022-08-10 NOTE — Telephone Encounter (Signed)
[name] called today to see if she could get a disability parking placard for herself. I have filled it out.

## 2022-08-10 NOTE — Telephone Encounter (Signed)
Parking placard has been completed, patient called and she came and picked up.

## 2022-08-13 DIAGNOSIS — Z681 Body mass index (BMI) 19 or less, adult: Secondary | ICD-10-CM | POA: Diagnosis not present

## 2022-08-13 DIAGNOSIS — M0609 Rheumatoid arthritis without rheumatoid factor, multiple sites: Secondary | ICD-10-CM | POA: Diagnosis not present

## 2022-08-13 DIAGNOSIS — M1991 Primary osteoarthritis, unspecified site: Secondary | ICD-10-CM | POA: Diagnosis not present

## 2022-08-13 DIAGNOSIS — M25572 Pain in left ankle and joints of left foot: Secondary | ICD-10-CM | POA: Diagnosis not present

## 2022-08-13 DIAGNOSIS — I7300 Raynaud's syndrome without gangrene: Secondary | ICD-10-CM | POA: Diagnosis not present

## 2022-08-15 DIAGNOSIS — L239 Allergic contact dermatitis, unspecified cause: Secondary | ICD-10-CM | POA: Diagnosis not present

## 2022-08-15 DIAGNOSIS — L649 Androgenic alopecia, unspecified: Secondary | ICD-10-CM | POA: Diagnosis not present

## 2022-08-20 ENCOUNTER — Other Ambulatory Visit: Payer: Self-pay

## 2022-08-20 MED ORDER — METOPROLOL SUCCINATE ER 25 MG PO TB24: 25.0000 mg | ORAL_TABLET | Freq: Every day | ORAL | 1 refills | Status: DC

## 2022-08-22 ENCOUNTER — Telehealth: Payer: Self-pay | Admitting: Internal Medicine

## 2022-08-22 MED ORDER — NYSTATIN-TRIAMCINOLONE 100000-0.1 UNIT/GM-% EX OINT: 1.0000 | TOPICAL_OINTMENT | Freq: Two times a day (BID) | CUTANEOUS | 2 refills | Status: DC

## 2022-08-22 NOTE — Telephone Encounter (Signed)
Rx refilled.

## 2022-08-22 NOTE — Telephone Encounter (Signed)
Patient is calling regarding refill for her medications Nystatin and Triamcinolone. [facility] [address]. Please advise. Thank you.

## 2022-08-22 NOTE — Telephone Encounter (Signed)
Okay to refill Sir? 

## 2022-09-05 DIAGNOSIS — M0609 Rheumatoid arthritis without rheumatoid factor, multiple sites: Secondary | ICD-10-CM | POA: Diagnosis not present

## 2022-09-05 DIAGNOSIS — R5383 Other fatigue: Secondary | ICD-10-CM | POA: Diagnosis not present

## 2022-09-05 DIAGNOSIS — Z79899 Other long term (current) drug therapy: Secondary | ICD-10-CM | POA: Diagnosis not present

## 2022-09-10 ENCOUNTER — Other Ambulatory Visit: Payer: Self-pay | Admitting: *Deleted

## 2022-09-10 DIAGNOSIS — I4819 Other persistent atrial fibrillation: Secondary | ICD-10-CM

## 2022-09-10 MED ORDER — APIXABAN 2.5 MG PO TABS
2.5000 mg | ORAL_TABLET | Freq: Two times a day (BID) | ORAL | 1 refills | Status: DC
Start: 2022-09-10 — End: 2022-12-12

## 2022-09-10 NOTE — Telephone Encounter (Signed)
Eliquis 2.5mg  refill request received. Patient is 82 years old, weight-51.7kg, Crea- 0.87 on 03/26/22, Diagnosis-Afib, and last seen by Dr. Bjorn Pippin on 04/11/22. Dose is appropriate based on dosing criteria. Will send in refill to requested pharmacy.

## 2022-09-12 ENCOUNTER — Ambulatory Visit: Payer: Medicare PPO | Admitting: Internal Medicine

## 2022-09-12 ENCOUNTER — Encounter: Payer: Self-pay | Admitting: Internal Medicine

## 2022-09-12 VITALS — BP 104/70 | HR 75 | Wt 116.0 lb

## 2022-09-12 DIAGNOSIS — E739 Lactose intolerance, unspecified: Secondary | ICD-10-CM

## 2022-09-12 DIAGNOSIS — K900 Celiac disease: Secondary | ICD-10-CM

## 2022-09-12 DIAGNOSIS — K580 Irritable bowel syndrome with diarrhea: Secondary | ICD-10-CM | POA: Diagnosis not present

## 2022-09-12 DIAGNOSIS — K52832 Lymphocytic colitis: Secondary | ICD-10-CM

## 2022-09-12 NOTE — Patient Instructions (Signed)
Glad to hear things are improved and I hope they stay that way!  Plan on routine visit in 1 year. Sooner if needed.  I appreciate the opportunity to care for you. Iva Boop, MD, [facility]

## 2022-09-12 NOTE — Progress Notes (Unsigned)
[name] 82 y.o. [birth year] [id number]  Assessment & Plan:   Encounter Diagnoses  Name Primary?   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  I suspect the stress of her husband's death and the moved to [facility] had a lot to do with that as does she.  She will follow-up in a year or sooner as needed.  Subjective:   Chief Complaint: Diarrhea and IBS follow-up  HPI [name] is here for follow-up after having been seen 07/16/2022.  She was having a flare of IBS diarrhea then.  Since that time things have settled down and she feels well and she is managing things well. Allergies  Allergen Reactions   Aspirin     Other reaction(s): GI upset   Cox-2 Inhibitors Nausea And Vomiting    Severe abd pain   Glucose Diarrhea    cramps   Lactose Intolerance (Gi) Diarrhea    Stomach cramps   Leflunomide     Other reaction(s): hairloss   Methotrexate     Other reaction(s): hairloss   Oxycodone-Acetaminophen     Other reaction(s): Unknown   Rosuvastatin Diarrhea   Sulfa Antibiotics     Unknown as child   Current Meds  Medication Sig   acetaminophen (TYLENOL) 500 MG tablet Take 500-1,000 mg by mouth every 8 (eight) hours as needed for moderate pain or headache.    apixaban (ELIQUIS) 2.5 MG TABS tablet Take 1 tablet (2.5 mg total) by mouth 2 (two) times daily.   atorvastatin (LIPITOR) 20 MG tablet TAKE 1 TABLET BY MOUTH THREE TIMES A WEEK   calcium carbonate (TUMS EX) 750 MG chewable tablet Chew 1,500 mg by mouth 2 (two) times daily.    diphenoxylate-atropine (LOMOTIL) 2.5-0.025 MG tablet TAKE 2 TABLETS BY MOUTH 4 (FOUR) TIMES DAILY AS NEEDED FOR DIARRHEA OR LOOSE STOOLS. MAXIMUM 8 TABLETS DAILY   esomeprazole (NEXIUM) 40 MG capsule Take 1 capsule (40 mg total) by mouth daily before breakfast.   hydrocortisone cream 1 % Apply 1 Application topically  daily as needed for itching (burns).   InFLIXimab (REMICADE IV) Inject 1 Dose into the vein See admin instructions. Remicade Infusion every 2 months   Ivermectin 1 % CREA 1 application to affected area   lactase (LACTAID) 3000 units tablet 1 tablet   loratadine (CLARITIN) 10 MG tablet 1 tablet   metoprolol succinate (TOPROL-XL) 25 MG 24 hr tablet Take 1 tablet (25 mg total) by mouth daily.   minoxidil (ROGAINE) 2 % external solution Apply 1 application topically 2 (two) times daily.   Multiple Vitamin (MULTIVITAMIN) tablet Take 1 tablet by mouth daily.   neomycin-bacitracin-polymyxin (NEOSPORIN) ointment Apply 1 application topically as needed for wound care.   nystatin-triamcinolone ointment (MYCOLOG) Apply 1 Application topically 2 (two) times daily.   Past Medical History:  Diagnosis Date   A-fib 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 10/16/2017   GERD (gastroesophageal reflux disease)    Hyperlipidemia    Irritable bowel syndrome with diarrhea 07/16/2022   Lactose intolerance 10/16/2017   Lymphocytic colitis w/ patchy collagen deposition 09/28/2016   Rheumatoid arthritis ([facility])    Status post dilation of esophageal narrowing 2014   Wears glasses    Past Surgical History:  Procedure  Laterality Date   CARDIOVERSION N/A 05/11/2019   Procedure: CARDIOVERSION;  Surgeon: Jodelle Red, MD;  Location: [facility];  Service: Cardiovascular;  Laterality: N/A;   CATARACT EXTRACTION  06/30/2016 -right eye   left eye in 08/15/16   COLONOSCOPY     DILATION AND CURETTAGE OF UTERUS  2009   ESOPHAGEAL DILATION  05/31/1997   REPAIR EXTENSOR TENDON Right 05/14/2013   Procedure: LONG FINGER EXTENSOR TO ELEVATE RING AND SMALL FINGERS ;  Surgeon: Wyn Forster., MD;  Location: [facility];  Service: Orthopedics;  Laterality: Right;   seborrheic keratosis  09/03/2012   inflamed, excised from forehead    TENDON TRANSFER Right 09/03/2013   Procedure: RIGHT HAND FLEXOR CARPI RADIALUS TRANSFER TO LONG/RING/SMALL;  Surgeon: Wyn Forster., MD;  Location: [facility];  Service: Orthopedics;  Laterality: Right;   TONSILLECTOMY     TOOTH EXTRACTION  05/2022   TUBAL LIGATION     ULNAR HEAD EXCISION Right 05/14/2013   Procedure: ARTHOPLASTY DISTAL RADIAL ULNAR JOINT, RIGHT ;  Surgeon: Wyn Forster., MD;  Location:  SURGERY CENTER;  Service: Orthopedics;  Laterality: Right;   UPPER GASTROINTESTINAL ENDOSCOPY     Social History   Social History Narrative   Health Care POA: Husband, FredEmergency Contact: FredEnd of Life Plan: Who lives with you: husbandSon, in [place] pets: 3 catsDiet: Pt has a varied diet of protein, starch, and vegetables.Exercise: Pt works with trainer 2x a week for 60 minutesSeatbelts: Pt reports wearing seatbelt when in vehicle.Sun Exposure/Protection: Pt reports not using sun protection.Hobbies: gardening, reading, concerts      Widowed 05/19/2022-moved to [facility] 05/2022   family history includes COPD in her father; Colon cancer in her paternal uncle; Heart disease in her mother; Osteoporosis in her maternal aunt, maternal grandmother, and mother.   Review of Systems   Objective:   Physical Exam BP 104/70   Pulse 75   Wt 116 lb (52.6 kg)   BMI 19.91 kg/m

## 2022-09-20 ENCOUNTER — Ambulatory Visit (INDEPENDENT_AMBULATORY_CARE_PROVIDER_SITE_OTHER): Payer: Medicare PPO | Admitting: Sports Medicine

## 2022-09-20 VITALS — BP 104/68 | Ht 64.0 in | Wt 109.0 lb

## 2022-09-20 DIAGNOSIS — M19072 Primary osteoarthritis, left ankle and foot: Secondary | ICD-10-CM

## 2022-09-20 NOTE — Progress Notes (Signed)
  [name] - 82 y.o. female MRN [id number]  Date of birth: Dec 28, 1940    CHIEF COMPLAINT:   Left ankle pain    SUBJECTIVE:   HPI:  Pleasant 82 year old female comes to clinic to be followed up for left ankle pain.  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  She has pain over the anterior lateral aspect of the left ankle.  She uses topical Salonpas and takes Tylenol when it aches.  No pain in the right ankle.  She has been wearing orthotics and heel lifts and compression sleeves for years.  She is here to get some more heel wedges.  ROS:     See HPI  PERTINENT  PMH / PSH FH / / SH:  Past Medical, Surgical, Social, and Family History Reviewed & Updated in the EMR.  Pertinent findings include:  none  OBJECTIVE: BP 104/68   Ht 5\' 4"  (1.626 m)   Wt 109 lb (49.4 kg)   BMI 18.71 kg/m   Physical Exam:  Vital signs are reviewed.  GEN: Alert and oriented, NAD Pulm: Breathing unlabored PSY: normal mood, congruent affect  MSK: L ankle - there is a large bone spur at the medial malleolus.  No overlying skin changes.  She is nontender to palpation of the posterior tip of the medial malleolus.  She is mildly tender to palpation over the anterior tip of the lateral malleolus.  Ankle mortise is widened and lateral malleolus shows squaring in shape. Nontender to palpation at the navicular or base of fifth metatarsal.  Dorsiflexion is limited.  Full range of motion in plantarflexion.  Negative anterior drawer.  Very little subtalar motion on talar tilt.  Neurovascular intact distally  Feet - good maintenance of longitudinal arch bilaterally.  Splaying of the toes consistent with collapse of the transverse arch bilaterally.  Significant hindfoot valgus on the left.  Neutral alignment on the right.  ASSESSMENT & PLAN:  1.  Left ankle osteoarthritis  -Will give her more medial wedge heel lifts to try to correct some of her hindfoot valgus.  Encouraged her to stay  walking and mobile is much as possible to prevent stiffness from setting in.  Will also give her 2 more compression sleeves at her request as well.  She can continue to use Salonpas and take Tylenol as needed.  She can follow-up as needed.   Arvella Nigh, MD PGY-4, Sports Medicine Fellow [facility]  I observed and examined the patient with the  Sm resident and agree with assessment and plan.  Note reviewed and modified by me. Sterling Big, MD

## 2022-10-08 ENCOUNTER — Telehealth: Payer: Self-pay | Admitting: Internal Medicine

## 2022-10-08 DIAGNOSIS — Z1231 Encounter for screening mammogram for malignant neoplasm of breast: Secondary | ICD-10-CM | POA: Diagnosis not present

## 2022-10-08 LAB — HM MAMMOGRAPHY

## 2022-10-08 MED ORDER — ESOMEPRAZOLE MAGNESIUM 40 MG PO CPDR: 40.0000 mg | DELAYED_RELEASE_CAPSULE | Freq: Every day | ORAL | 3 refills | Status: DC

## 2022-10-08 NOTE — Telephone Encounter (Signed)
Patient called to request a refill on [facility] [address]

## 2022-10-08 NOTE — Telephone Encounter (Signed)
Generic Nexium refilled. She is up to date on her office visits.

## 2022-10-09 ENCOUNTER — Encounter: Payer: Self-pay | Admitting: Internal Medicine

## 2022-10-10 ENCOUNTER — Ambulatory Visit (HOSPITAL_COMMUNITY): Payer: Medicare PPO | Attending: Cardiology

## 2022-10-10 DIAGNOSIS — I4821 Permanent atrial fibrillation: Secondary | ICD-10-CM | POA: Insufficient documentation

## 2022-10-10 LAB — ECHOCARDIOGRAM COMPLETE
MV M vel: 4.41 m/s
MV Peak grad: 77.9 mmHg
P 1/2 time: 690 msec
S' Lateral: 3.2 cm

## 2022-10-11 ENCOUNTER — Other Ambulatory Visit: Payer: Self-pay

## 2022-10-11 DIAGNOSIS — I4819 Other persistent atrial fibrillation: Secondary | ICD-10-CM

## 2022-10-21 IMAGING — CT CT CARDIAC CORONARY ARTERY CALCIUM SCORE
3 series · 14 of 20 positions shown, 16 images · non-contrast
Comparison: None.
COMPARISON: None.

Addendum:
EXAM:
OVER-READ INTERPRETATION  CT CHEST

The following report is an over-read performed by radiologist Dr.
Keisuke Eimori [[email or web address]] on 01/09/2021. This over-read
does not include interpretation of cardiac or coronary anatomy or
pathology. The calcium score interpretation by the cardiologist is
attached.
TECHNIQUE: A gated, non-contrast computed tomography scan of the heart was
performed using 3mm slice thickness. Axial images were analyzed on a
dedicated workstation. Calcium scoring of the coronary arteries was
performed using the Agatston method.

[Series 2: cascseq 2.0 sa36 (id) (id) · axial · 0.39mm/px · z∈[-239,-149]mm · 4 of 76 slices shown]
[im 16/76  vessel]
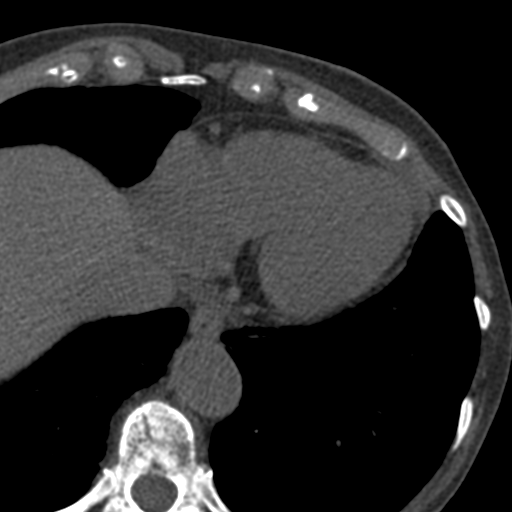
[im 31/76  vessel]
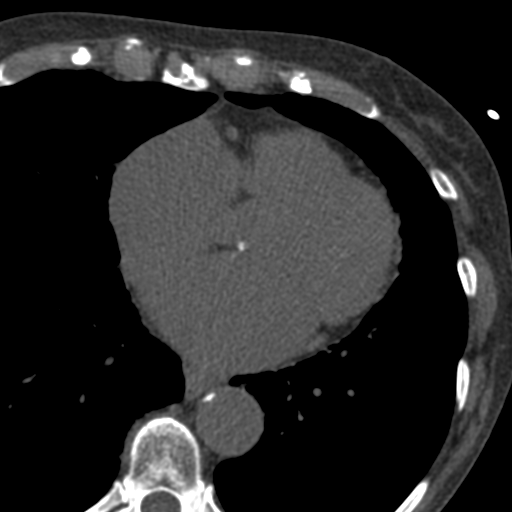
[im 46/76  vessel]
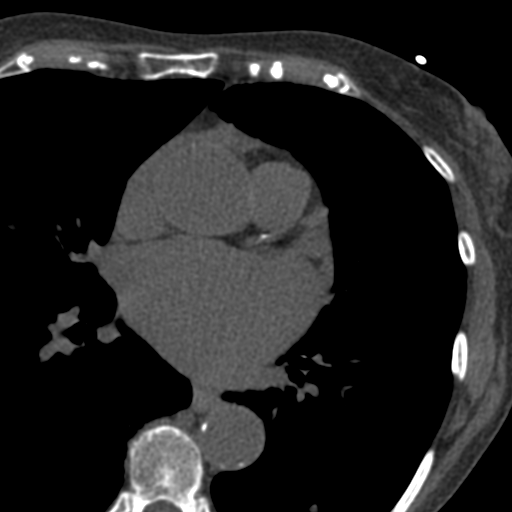
[im 61/76  vessel]
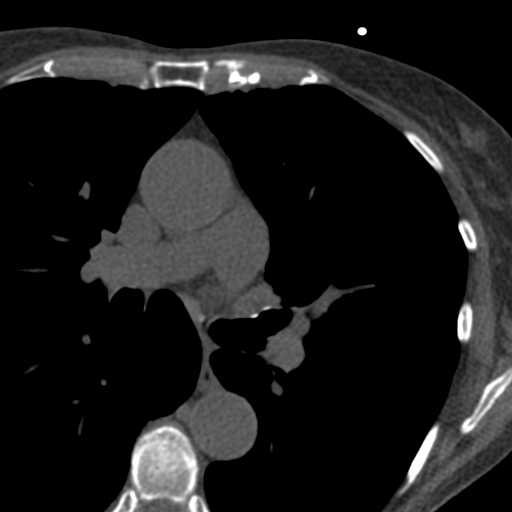

[Series 3: cascseq 2.0 bf37 st · axial · 0.63mm/px · z∈[-245,-145]mm · 5 of 76 slices shown, 7 images]
[im 13/76  vessel]
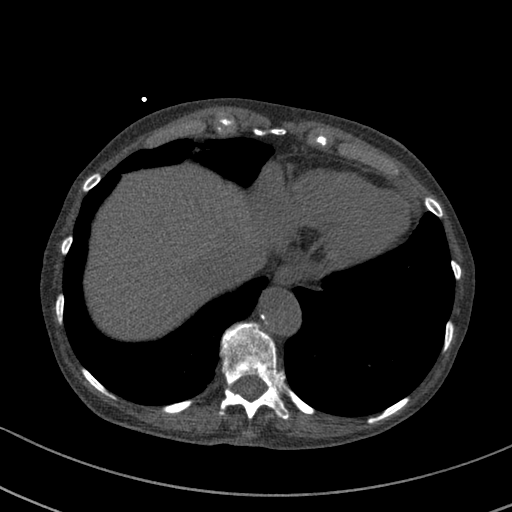
[im 13/76  lung]
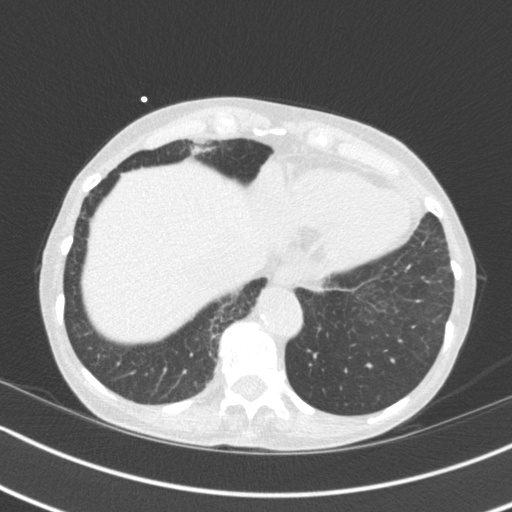
[im 26/76  vessel]
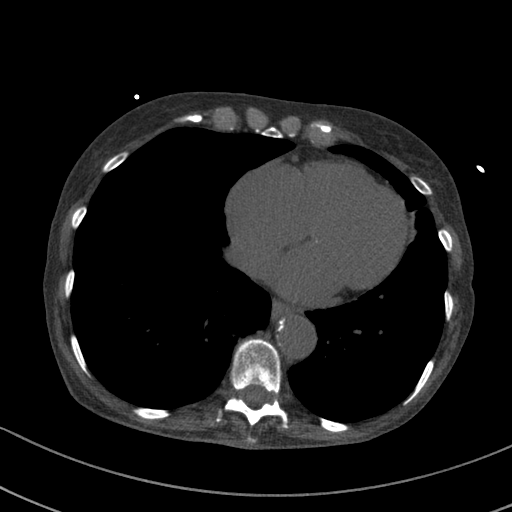
[im 38/76  vessel]
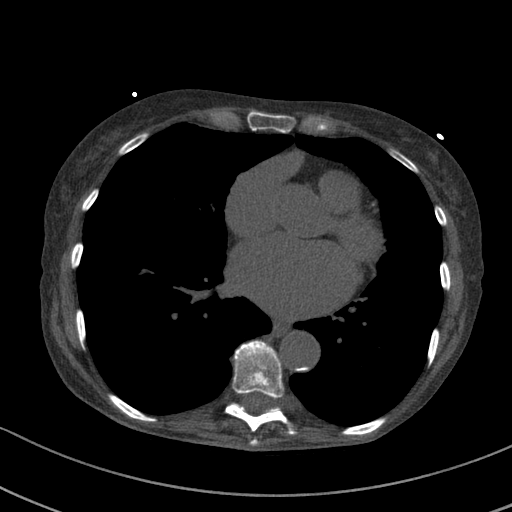
[im 51/76  vessel]
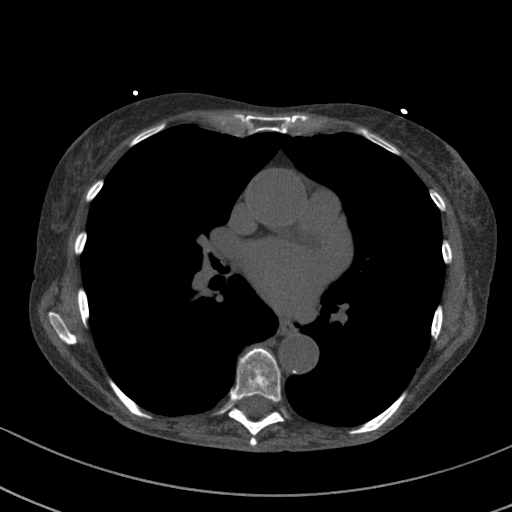
[im 63/76  vessel]
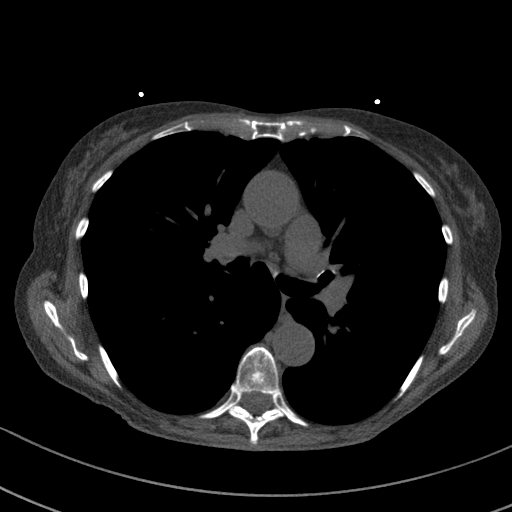
[im 63/76  lung]
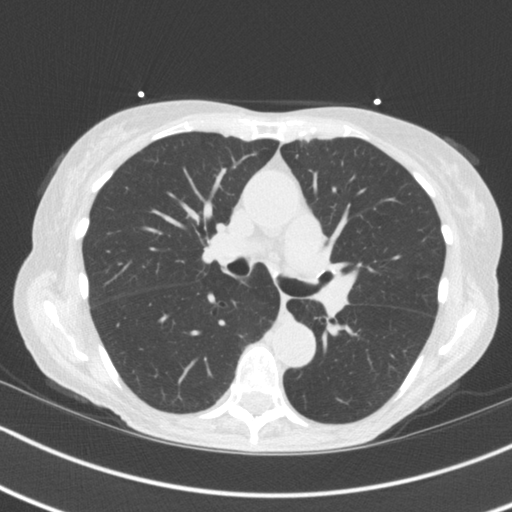

[Series 4: cascseq 2.0 br59 lung · axial · 0.63mm/px · z∈[-245,-145]mm · 5 of 76 slices shown]
[im 13/76  lung]
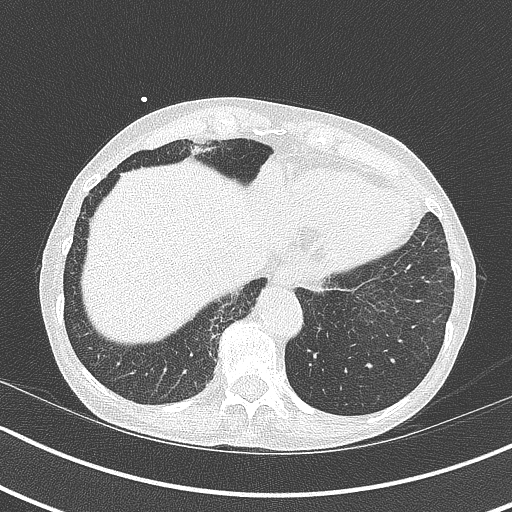
[im 26/76  lung]
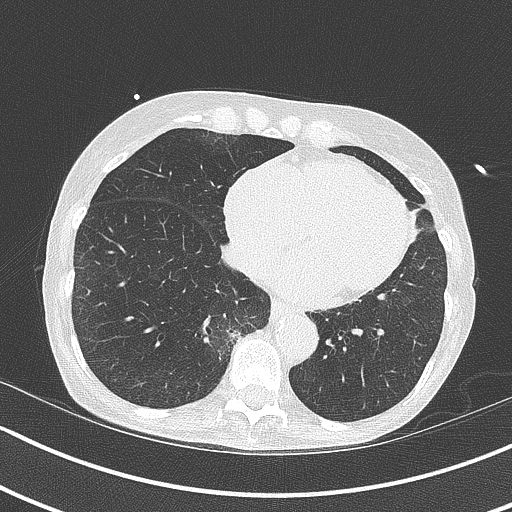
[im 38/76  lung]
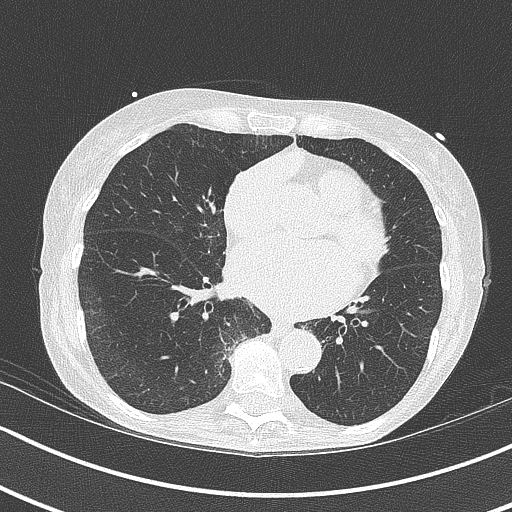
[im 51/76  lung]
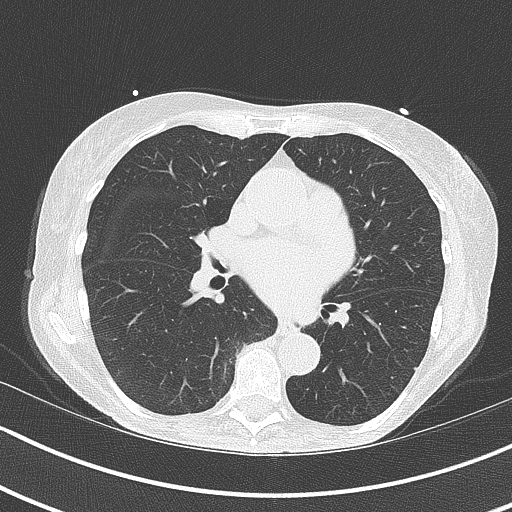
[im 63/76  lung]
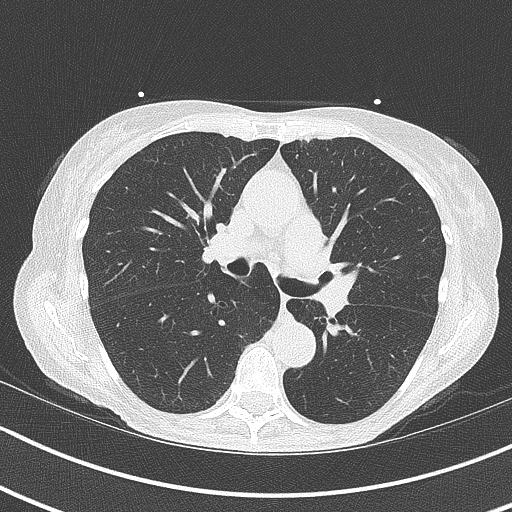

[14 of 20 positions shown; findings below may reference images not displayed]

FINDINGS: Vascular: Aortic atherosclerosis.  Tortuous thoracic aorta.

Mediastinum/Nodes: No mediastinal or definite hilar adenopathy,
given limitations of unenhanced CT.

Lungs/Pleura: No pleural fluid. 3 mm probable subpleural lymph node
along the right minor fissure on 36/4.

Right lower lobe probable subpleural lymph nodes including at up to
5 mm on 59/4.

A left lower lobe pulmonary nodule of 4 mm on [DATE].

Upper Abdomen: Normal imaged portions of the liver, spleen, stomach.

Musculoskeletal: No acute osseous abnormality.
IMPRESSION: 1.  No acute findings in the imaged extracardiac chest.
2.  Aortic Atherosclerosis (LNNTT-VBV.V).
3. Bilateral pulmonary nodules, primarily felt to represent
subpleural lymph nodes. Maximally 5 mm. No follow-up needed if
patient is low-risk. Non-contrast chest CT can be considered in 12
months if patient is high-risk. This recommendation follows the
consensus statement: Guidelines for Management of Incidental
Pulmonary Nodules Detected on CT Images: From the [REDACTED]AL DATA:  66F for cardiovascular disease risk stratification

EXAM:
Coronary Calcium Score
FINDINGS: Coronary arteries: Normal origins.

Coronary Calcium Score:

Left main: 13

Left anterior descending artery:

Left circumflex artery:

Right coronary artery: 0

Total: 121

Percentile: 50th

Pericardium: Normal.

Ascending Aorta: Normal caliber. Calcification of the aortic arch
and descending aorta.

Non-cardiac: See separate report from [REDACTED].

Mild calcification of the aortic valve.
IMPRESSION: Coronary calcium score of 121. This was 50th percentile for age-,
race-, and sex-matched controls.



If CAC=0, it is reasonable to withhold statin therapy and reassess
in 5 to 10 years, as long as higher risk conditions are absent
(diabetes mellitus, family history of premature CHD in first degree
relatives (males <55 years; females <65 years), cigarette smoking,
or LDL >=190 mg/dL).

If CAC is 1 to 99, it is reasonable to initiate statin therapy for
patients >=55 years of age.

If CAC is >=100 or >=75th percentile, it is reasonable to initiate
statin therapy at any age.

Cardiology referral should be considered for patients with CAC
scores >=400 or >=75th percentile.

*8901 AHA/ACC/AACVPR/AAPA/ABC/SHULING/EMMAJOHN/MATSUNAGA/Jean Marc/QUIRIJN/GEORTIN/ONEMA
Guideline on the Management of Blood Cholesterol: A Report of the
American College of Cardiology/American Heart Association Task Force
on Clinical Practice Guidelines. J Am Coll Cardiol.
8204;73(24):9510-9257.

*** End of Addendum ***
EXAM:
OVER-READ INTERPRETATION  CT CHEST

The following report is an over-read performed by radiologist Dr.
Keisuke Eimori [REDACTED] on 01/09/2021. This over-read
does not include interpretation of cardiac or coronary anatomy or
pathology. The calcium score interpretation by the cardiologist is
attached.
FINDINGS: Vascular: Aortic atherosclerosis.  Tortuous thoracic aorta.

Mediastinum/Nodes: No mediastinal or definite hilar adenopathy,
given limitations of unenhanced CT.

Lungs/Pleura: No pleural fluid. 3 mm probable subpleural lymph node
along the right minor fissure on 36/4.

Right lower lobe probable subpleural lymph nodes including at up to
5 mm on 59/4.

A left lower lobe pulmonary nodule of 4 mm on [DATE].

Upper Abdomen: Normal imaged portions of the liver, spleen, stomach.

Musculoskeletal: No acute osseous abnormality.
IMPRESSION: 1.  No acute findings in the imaged extracardiac chest.
2.  Aortic Atherosclerosis (LNNTT-VBV.V).
3. Bilateral pulmonary nodules, primarily felt to represent
subpleural lymph nodes. Maximally 5 mm. No follow-up needed if
patient is low-risk. Non-contrast chest CT can be considered in 12
months if patient is high-risk. This recommendation follows the
consensus statement: Guidelines for Management of Incidental
Pulmonary Nodules Detected on CT Images: From the [HOSPITAL]

## 2022-10-26 ENCOUNTER — Ambulatory Visit: Payer: Medicare PPO | Admitting: Cardiology

## 2022-10-29 ENCOUNTER — Telehealth: Payer: Self-pay | Admitting: Internal Medicine

## 2022-10-29 NOTE — Telephone Encounter (Signed)
[name] is requesting a refill on her lomotil to go to [id number] please.   She said there is medicine for lactose intolerance so surely there must be something for gluten allergy people. Please advise Sir, thank you.

## 2022-10-29 NOTE — Telephone Encounter (Signed)
Inbound call from patient requesting for PJ to give her a call back. Patient would like to discuss medication for a gluten free diet. Would like to know if there is a medication or supplement that will allow her to intake gluten. Please advise, thank you.

## 2022-10-30 MED ORDER — DIPHENOXYLATE-ATROPINE 2.5-0.025 MG PO TABS: 2.0000 | ORAL_TABLET | Freq: Four times a day (QID) | ORAL | 1 refills | Status: DC | PRN

## 2022-10-30 NOTE — Telephone Encounter (Signed)
Refilled  No pill for gluten intolerance yet  She could try FODZYME to see if that helps  Mail her a sheet

## 2022-10-30 NOTE — Telephone Encounter (Signed)
I spoke with Rossetta and told her what Dr Leone Payor said. She confirmed her address and I will mail her the [place] information tomorrow.

## 2022-10-31 ENCOUNTER — Encounter: Payer: Self-pay | Admitting: Podiatry

## 2022-10-31 ENCOUNTER — Ambulatory Visit: Payer: Medicare PPO | Admitting: Podiatry

## 2022-10-31 DIAGNOSIS — M79674 Pain in right toe(s): Secondary | ICD-10-CM | POA: Diagnosis not present

## 2022-10-31 DIAGNOSIS — B351 Tinea unguium: Secondary | ICD-10-CM

## 2022-10-31 DIAGNOSIS — M79675 Pain in left toe(s): Secondary | ICD-10-CM | POA: Diagnosis not present

## 2022-10-31 NOTE — Progress Notes (Signed)
  Subjective:  Patient ID: [name], female    DOB: 1941-03-11,  MRN: [id number]  [name] presents to clinic today for: painful elongated mycotic toenails 1-5 bilaterally which are tender when wearing enclosed shoe gear. Pain is relieved with periodic professional debridement.  Chief Complaint  Patient presents with   NAIL CARE    RFC    PCP is Baxley, Luanna Cole, MD.  Allergies  Allergen Reactions   Aspirin     Other reaction(s): GI upset   Cox-2 Inhibitors Nausea And Vomiting    Severe abd pain   Glucose Diarrhea    cramps   Lactose Intolerance (Gi) Diarrhea    Stomach cramps   Leflunomide     Other reaction(s): hairloss   Methotrexate     Other reaction(s): hairloss   Oxycodone-Acetaminophen     Other reaction(s): Unknown   Rosuvastatin Diarrhea   Sulfa Antibiotics     Unknown as child    Review of Systems: Negative except as noted in the HPI.  Objective: No changes noted in today's physical examination. There were no vitals filed for this visit.  [name] is a pleasant 82 y.o. female in NAD. AAO x 3.  Vascular Examination: Capillary refill time <3 seconds b/l LE. Palpable pedal pulses b/l LE. Digital hair present b/l. No pedal edema b/l. Skin temperature gradient WNL b/l. No varicosities b/l. Marland Kitchen  Dermatological Examination: Pedal skin with normal turgor, texture and tone b/l. No open wounds. No interdigital macerations b/l. Toenails 1-5 b/l thickened, discolored, dystrophic with subungual debris. There is pain on palpation to dorsal aspect of nailplates. .  Neurological Examination: Protective sensation intact with 10 gram monofilament b/l LE. Vibratory sensation intact b/l LE.   Musculoskeletal Examination: Normal muscle strength 5/5 to all lower extremity muscle groups bilaterally. Hammertoe(s) noted to the 2-5 left foot and 2-5 right foot.Marland Kitchen No pain, crepitus or joint limitation noted with ROM b/l LE.  Patient ambulates independently without  assistive aids.  Assessment/Plan: 1. Pain due to onychomycosis of toenails of both feet    -Consent given for treatment as described below: -Examined patient. -Patient to continue soft, supportive shoe gear daily. -Mycotic toenails 1-5 bilaterally were debrided in length and girth with sterile nail nippers and dremel without incident. -Patient/POA to call should there be question/concern in the interim.   Return in about 3 months (around 01/31/2023).  Freddie Breech, DPM

## 2022-11-05 DIAGNOSIS — M0609 Rheumatoid arthritis without rheumatoid factor, multiple sites: Secondary | ICD-10-CM | POA: Diagnosis not present

## 2022-11-05 DIAGNOSIS — Z79899 Other long term (current) drug therapy: Secondary | ICD-10-CM | POA: Diagnosis not present

## 2022-11-05 DIAGNOSIS — R5383 Other fatigue: Secondary | ICD-10-CM | POA: Diagnosis not present

## 2022-11-06 ENCOUNTER — Telehealth: Payer: Self-pay | Admitting: Cardiology

## 2022-11-06 NOTE — Telephone Encounter (Signed)
Pt c/o medication issue:  1. Name of Medication: apixaban (ELIQUIS) 2.5 MG TABS tablet   2. How are you currently taking this medication (dosage and times per day)?   Take 1 tablet (2.5 mg total) by mouth 2 (two) times daily.    3. Are you having a reaction (difficulty breathing--STAT)? No  4. What is your medication issue? Patient is calling because she has been feeling unsteady in the morning when she wakes up. Patient stated that she does not feel dizzy, just unsteady before she get out of bed in the morning. Patient stated that this has been happening for about 6 months now. Patient stated that she is unsure if her Eliquis dosage is too low. Please advise.

## 2022-11-06 NOTE — Telephone Encounter (Signed)
Spoke to patient advised Dr.Schumann is out of office this week.Stated she has been having dizziness off and on for the past 6 months.Stated seems to be getting worse.No chest pain.No fast heart beat.She does not check her B/P.Appointment scheduled with Joni Reining DNP 7/15 at 2:45 pm.Advised to bring a list of all medications.

## 2022-11-06 NOTE — Telephone Encounter (Signed)
Spoke to the pt, for the past 6 months pt has experienced lightheadedness when she wakes up in the morning. Pt stated this maybe a side effect from the eliquis, pt is also taking metoprolol 25 mg XL at night. Will forward to MD and nurse for advise.

## 2022-11-07 NOTE — Telephone Encounter (Signed)
Agree with plan 

## 2022-11-11 NOTE — Progress Notes (Signed)
Cardiology Clinic Note   Patient Name: [name] Date of Encounter: 11/12/2022  Primary Care Provider:  Margaree Mackintosh, MD Primary Cardiologist:  Little Ishikawa, MD  Patient Profile    82 year old female we are following with history of hyperlipidemia, permanent atrial fibrillation on Eliquis 2.5 mg twice daily due to her age and weight,, status post successful DCCV in 2021, however returned to atrial fibrillation 3 months later on follow-up.  Being followed by the A-fib clinic.  She remains on metoprolol  succinate 25 mg daily.  Last seen by Dr. Bjorn Pippin on 04/11/2022.   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   Lactose intolerance 10/16/2017   Lymphocytic colitis w/ patchy collagen deposition 09/28/2016   Rheumatoid arthritis (HCC)    Status post dilation of esophageal narrowing 2014   Wears glasses    Past Surgical History:  Procedure Laterality Date   CARDIOVERSION N/A 05/11/2019   Procedure: CARDIOVERSION;  Surgeon: Jodelle Red, MD;  Location: [facility] ENDOSCOPY;  Service: Cardiovascular;  Laterality: N/A;   CATARACT EXTRACTION  06/30/2016 -right eye   left eye in 08/15/16   COLONOSCOPY     DILATION AND CURETTAGE OF UTERUS  2009   ESOPHAGEAL DILATION  05/31/1997   REPAIR EXTENSOR TENDON Right 05/14/2013   Procedure: LONG FINGER EXTENSOR TO ELEVATE RING AND SMALL FINGERS ;  Surgeon: Wyn Forster., MD;  Location: [facility];  Service: Orthopedics;  Laterality: Right;   seborrheic keratosis  09/03/2012   inflamed, excised from forehead   TENDON TRANSFER Right 09/03/2013   Procedure: RIGHT HAND FLEXOR CARPI RADIALUS TRANSFER TO LONG/RING/SMALL;  Surgeon: Wyn Forster., MD;   Location: [facility];  Service: Orthopedics;  Laterality: Right;   TONSILLECTOMY     TOOTH EXTRACTION  05/2022   TUBAL LIGATION     ULNAR HEAD EXCISION Right 05/14/2013   Procedure: ARTHOPLASTY DISTAL RADIAL ULNAR JOINT, RIGHT ;  Surgeon: Wyn Forster., MD;  Location: [facility];  Service: Orthopedics;  Laterality: Right;   UPPER GASTROINTESTINAL ENDOSCOPY      Allergies  Allergies  Allergen Reactions   Aspirin     Other reaction(s): GI upset   Cox-2 Inhibitors Nausea And Vomiting    Severe abd pain   Glucose Diarrhea    cramps   Lactose Intolerance (Gi) Diarrhea    Stomach cramps   Leflunomide     Other reaction(s): hairloss   Methotrexate     Other reaction(s): hairloss   Oxycodone-Acetaminophen     Other reaction(s): Unknown   Rosuvastatin Diarrhea   Sulfa Antibiotics     Unknown as child    History of Present Illness    [name] is a very pleasant lady who returns to the office today for ongoing assessment and management of permanent atrial fibrillation, and hyperlipidemia.  She called our office with complaints of worsening dizziness occurring on and off for the last 6 months and appears to be getting worse.    She states that she feels very woozy in the morning and has recently changed her metoprolol dose from taking at night to taking during the day and  has only been doing so for the last couple of days.  She has not noticed a change at this time concerning her symptoms.  She does not have any syncopal episodes or presyncope.  She just feels a little lightheaded.  She remedies this by drinking water, taking her time standing up, and once standing weights a few seconds before beginning to walk.  It last less than a minute and she does not experience it any further throughout the day.  This is occurring only in the morning.  She denies any chest pain, palpitations, or shortness of breath associated with these symptoms.  She has  rheumatoid arthritis and has been getting Remicade infusions through the rheumatologist every 2 months.  Frequent labs are drawn by rheumatology.  She is also being followed by her primary care provider who draws labs every 6 months. Home Medications    Current Outpatient Medications  Medication Sig Dispense Refill   acetaminophen (TYLENOL) 500 MG tablet Take 500-1,000 mg by mouth every 8 (eight) hours as needed for moderate pain or headache.      apixaban (ELIQUIS) 2.5 MG TABS tablet Take 1 tablet (2.5 mg total) by mouth 2 (two) times daily. 180 tablet 1   aspirin (BAYER PLUS) 500 MG buffered tablet Take 500 mg by mouth as needed.     atorvastatin (LIPITOR) 20 MG tablet TAKE 1 TABLET BY MOUTH THREE TIMES A WEEK 36 tablet 0   calcium carbonate (TUMS EX) 750 MG chewable tablet Chew 1,500 mg by mouth as needed.     Clobetasol Propionate 0.05 % shampoo Apply topically 3 (three) times a week.     diphenhydrAMINE (BENADRYL) 25 MG tablet Take 25 mg by mouth as needed.     diphenoxylate-atropine (LOMOTIL) 2.5-0.025 MG tablet Take 2 tablets by mouth 4 (four) times daily as needed for diarrhea or loose stools. Maximum 8 tablets daily 90 tablet 1   esomeprazole (NEXIUM) 40 MG capsule Take 1 capsule (40 mg total) by mouth daily before breakfast. 90 capsule 3   hydrocortisone cream 1 % Apply 1 Application topically daily as needed for itching (burns). 28.35 g 1   InFLIXimab (REMICADE IV) Inject 1 Dose into the vein See admin instructions. Remicade Infusion every 2 months     Ivermectin 1 % CREA 1 application to affected area     lactase (LACTAID) 3000 units tablet 1 tablet     metoprolol succinate (TOPROL-XL) 25 MG 24 hr tablet Take 1 tablet (25 mg total) by mouth daily. 90 tablet 1   minoxidil (ROGAINE) 2 % external solution Apply 1 application topically 2 (two) times daily.     Multiple Vitamin (MULTIVITAMIN) tablet Take 1 tablet by mouth daily.     neomycin-bacitracin-polymyxin (NEOSPORIN) ointment Apply  1 application topically as needed for wound care.     nystatin-triamcinolone ointment (MYCOLOG) Apply 1 Application topically 2 (two) times daily. 30 g 2   loratadine (CLARITIN) 10 MG tablet 1 tablet (Patient not taking: Reported on 11/12/2022)     No current facility-administered medications for this visit.     Family History    Family History  Problem Relation Age of Onset   Heart disease Mother    Osteoporosis Mother    COPD Father    Osteoporosis Maternal Grandmother    Colon cancer Paternal Uncle    Osteoporosis Maternal Aunt    Esophageal cancer Neg Hx    Pancreatic cancer Neg Hx    Rectal cancer Neg Hx  Stomach cancer Neg Hx    She indicated that her mother is deceased. She indicated that her father is deceased. She indicated that the status of her maternal grandmother is unknown. She indicated that the status of her maternal aunt is unknown. She indicated that her paternal uncle is deceased. She indicated that the status of her neg hx is unknown.  Social History    Social History   Socioeconomic History   Marital status: Widowed    Spouse name: Merlyn Albert   Number of children: 1   Years of education: 16   Highest education level: Not on file  Occupational History   Occupation: Retired- Dentist: RETIRED  Tobacco Use   Smoking status: Never   Smokeless tobacco: Never  Vaping Use   Vaping status: Never Used  Substance and Sexual Activity   Alcohol use: Yes    Alcohol/week: 21.0 standard drinks of alcohol    Types: 21 Standard drinks or equivalent per week    Comment: 3 times weekly   Drug use: No   Sexual activity: Not on file  Other Topics Concern   Not on file  Social History Narrative   Health Care POA: Husband, FredEmergency Contact: FredEnd of Life Plan: Who lives with you: husbandSon, in [place] pets: 3 catsDiet: Pt has a varied diet of protein, starch, and vegetables.Exercise: Pt works with trainer 2x a week for 60 minutesSeatbelts: Pt  reports wearing seatbelt when in vehicle.Sun Exposure/Protection: Pt reports not using sun protection.Hobbies: gardening, reading, concerts      Widowed 05/19/2022-moved to [place] 05/2022   Social Determinants of Health   Financial Resource Strain: Not on file  Food Insecurity: Not on file  Transportation Needs: Not on file  Physical Activity: Not on file  Stress: Not on file  Social Connections: Not on file  Intimate Partner Violence: Not on file     Review of Systems    General:  No chills, fever, night sweats or weight changes.  Positive for lightheadedness first thing in the morning, usually lasting approximately 1 minute. Cardiovascular:  No chest pain, dyspnea on exertion, edema, orthopnea, palpitations, paroxysmal nocturnal dyspnea. Dermatological: No rash, lesions/masses Respiratory: No cough, dyspnea Urologic: No hematuria, dysuria Abdominal:   No nausea, vomiting, diarrhea, bright red blood per rectum, melena, or hematemesis Neurologic:  No visual changes, wkns, changes in mental status. All other systems reviewed and are otherwise negative except as noted above.       Physical Exam    VS:  BP 100/60 (BP Location: Left Arm, Patient Position: Sitting, Cuff Size: Normal)   Pulse 77   Ht 5\' 4"  (1.626 m)   Wt 115 lb 12.8 oz (52.5 kg)   SpO2 100%   BMI 19.88 kg/m  , BMI Body mass index is 19.88 kg/m.     GEN: Well nourished, well developed, in no acute distress. HEENT: normal. Neck: Supple, no JVD, carotid bruits, or masses. Cardiac: IRRR, no murmurs, rubs, or gallops. No clubbing, cyanosis, edema.  Radials/DP/PT 2+ and equal bilaterally.  Respiratory:  Respirations regular and unlabored, clear to auscultation bilaterally. GI: Soft, nontender, nondistended, BS + x 4. MS: no deformity or atrophy. Skin: warm and dry, no rash. Neuro:  Strength and sensation are intact. Psych: Normal affect.      Lab Results  Component Value Date   WBC 6.6 03/26/2022   HGB  14.2 03/26/2022   HCT 41.6 03/26/2022   MCV 99.8 03/26/2022   PLT 215 03/26/2022  Lab Results  Component Value Date   CREATININE 0.87 03/26/2022   BUN 17 03/26/2022   NA 140 03/26/2022   K 5.0 03/26/2022   CL 103 03/26/2022   CO2 29 03/26/2022   Lab Results  Component Value Date   ALT 19 03/26/2022   AST 21 03/26/2022   ALKPHOS 45 08/03/2016   BILITOT 0.6 03/26/2022   Lab Results  Component Value Date   CHOL 235 (H) 03/26/2022   HDL 121 03/26/2022   LDLCALC 96 03/26/2022   LDLDIRECT 149 (H) 11/11/2007   TRIG 87 03/26/2022   CHOLHDL 1.9 03/26/2022    No results found for: "HGBA1C"   Review of Prior Studies Echocardiogram 612/2024     1. Left ventricular ejection fraction, by estimation, is 55%. The left  ventricle has low normal function due to significant beat to beat  variability from atrial fibrillation. The left ventricle has no regional  wall motion abnormalities. Left ventricular   diastolic function could not be evaluated.   2. Right ventricular systolic function is normal. The right ventricular  size is normal.   3. Left atrial size was severely dilated.   4. Right atrial size was severely dilated.   5. The mitral valve is normal in structure. No evidence of mitral valve  regurgitation. No evidence of mitral stenosis.   6. Tricuspid valve regurgitation is moderate.   7. The aortic valve is normal in structure. Aortic valve regurgitation is  moderate. No aortic stenosis is present.   8. The inferior vena cava is normal in size with greater than 50%  respiratory variability, suggesting right atrial pressure of 3 mmHg.    Assessment & Plan   1.  Permanent atrial fibrillation: Heart rate is well-controlled on metoprolol succinate.  She has changed the timing of her dosing from nighttime to daytime to see if she can avoid the lightheadedness and "wooziness" first thing in the morning.  She is only changes for the last couple days and has not felt any changes.   This is a little frustrating for her.    I am not hearing or seeing anything that concerns me which may be causing this issue.  I reviewed her medications and do not see any diuretics, or dehydrating medications with exception of Benadryl.  She only takes this as needed so doubt this is playing a part in her symptoms.  Continue to watch this.  May need to put a cardiac monitor on her to evaluate if she is having issues with rapid heart rate initially when awakening, versus bradycardia initially when she is awakening.  She is due to see his primary cardiologist in October 2024 at which time the decision can be made to add a monitor at his discretion.  2.  Hypercholesterolemia: She remains on atorvastatin 20 mg daily 3 times a week.  Labs are drawn by primary care provider Dr. Lenord Fellers.  3.  Rheumatoid arthritis: Being followed by rheumatology and getting Remicade infusions every 2 months.  Labs are drawn during her infusions.       Signed, Bettey Mare. Liborio Nixon, ANP, AACC   11/12/2022 3:55 PM      Office [phone] Fax [phone]  Notice: This dictation was prepared with Dragon dictation along with smaller phrase technology. Any transcriptional errors that result from this process are unintentional and may not be corrected upon review.

## 2022-11-12 ENCOUNTER — Ambulatory Visit: Payer: Medicare PPO | Attending: Adult Health | Admitting: Adult Health

## 2022-11-12 ENCOUNTER — Encounter: Payer: Self-pay | Admitting: Adult Health

## 2022-11-12 VITALS — BP 100/60 | HR 77 | Ht 64.0 in | Wt 115.8 lb

## 2022-11-12 DIAGNOSIS — I4819 Other persistent atrial fibrillation: Secondary | ICD-10-CM

## 2022-11-12 DIAGNOSIS — M05731 Rheumatoid arthritis with rheumatoid factor of right wrist without organ or systems involvement: Secondary | ICD-10-CM

## 2022-11-12 DIAGNOSIS — R42 Dizziness and giddiness: Secondary | ICD-10-CM | POA: Diagnosis not present

## 2022-11-12 DIAGNOSIS — E7800 Pure hypercholesterolemia, unspecified: Secondary | ICD-10-CM | POA: Diagnosis not present

## 2022-11-12 NOTE — Patient Instructions (Signed)
Medication Instructions:  No Changes *If you need a refill on your cardiac medications before your next appointment, please call your pharmacy*   Lab Work: No Labs If you have labs (blood work) drawn today and your tests are completely normal, you will receive your results only by: MyChart Message (if you have MyChart) OR A paper copy in the mail If you have any lab test that is abnormal or we need to change your treatment, we will call you to review the results.   Testing/Procedures: No Testing   Follow-Up: At [facility], you and your health needs are our priority.  As part of our continuing mission to provide you with exceptional heart care, we have created designated Provider Care Teams.  These Care Teams include your primary Cardiologist (physician) and Advanced Practice Providers (APPs -  Physician Assistants and Nurse Practitioners) who all work together to provide you with the care you need, when you need it.  We recommend signing up for the patient portal called "MyChart".  Sign up information is provided on this After Visit Summary.  MyChart is used to connect with patients for Virtual Visits (Telemedicine).  Patients are able to view lab/test results, encounter notes, upcoming appointments, etc.  Non-urgent messages can be sent to your provider as well.   To learn more about what you can do with MyChart, go to [email or web address].    Your next appointment:   Keep Scheduled Appointment  Provider:   Little Ishikawa, MD

## 2022-12-12 ENCOUNTER — Telehealth: Payer: Self-pay | Admitting: Cardiology

## 2022-12-12 DIAGNOSIS — I4819 Other persistent atrial fibrillation: Secondary | ICD-10-CM

## 2022-12-12 MED ORDER — APIXABAN 2.5 MG PO TABS
2.5000 mg | ORAL_TABLET | Freq: Two times a day (BID) | ORAL | 1 refills | Status: DC
Start: 2022-12-12 — End: 2023-05-31

## 2022-12-12 NOTE — Telephone Encounter (Signed)
*  STAT* If patient is at the pharmacy, call can be transferred to refill team.   1. Which medications need to be refilled? (please list name of each medication and dose if known)   apixaban (ELIQUIS) 2.5 MG TABS tablet    2. Which pharmacy/location (including street and city if local pharmacy) is medication to be sent to?  [id number] - [place], [place] - [address]    3. Do they need a 30 day or 90 day supply? 90

## 2022-12-12 NOTE — Telephone Encounter (Signed)
Prescription refill request for Eliquis received. Indication: Afib  Last office visit: 11/12/22 Bjorn Pippin)  Scr: 0.87 (03/26/22)  Age: 82 Weight: 52.5kg  Appropriate dose. Refill sent.

## 2023-01-03 ENCOUNTER — Telehealth: Payer: Self-pay | Admitting: Internal Medicine

## 2023-01-03 NOTE — Telephone Encounter (Signed)
Do you know of any medicine for this Sir.

## 2023-01-03 NOTE — Telephone Encounter (Signed)
I told her unfortunately there are no meds for gluten sensitivity. She is having some issues with her bowels and said she is trying her best to do a gluten free diet. She has taken #9 lomotil since early AM. I told her Dr Leone Payor said only 2 tablets four times a day prn. She will try her best to do that. She will call us back if problems continue. No fever.

## 2023-01-03 NOTE — Telephone Encounter (Signed)
Patient called would like to discuss if there is a medication for gluten free sensitivity.

## 2023-01-03 NOTE — Telephone Encounter (Signed)
There are no medications for gluten sensitivity the treatment is to avoid gluten in the diet

## 2023-01-07 DIAGNOSIS — M0609 Rheumatoid arthritis without rheumatoid factor, multiple sites: Secondary | ICD-10-CM | POA: Diagnosis not present

## 2023-01-29 ENCOUNTER — Telehealth: Payer: Self-pay | Admitting: Internal Medicine

## 2023-01-29 NOTE — Telephone Encounter (Signed)
Called patient back to let her know DR Lenord Fellers does recommend her getting RSV Vaccine, she verbalized understanding

## 2023-01-29 NOTE — Telephone Encounter (Signed)
[name] [phone]  [name] just called to see if she needs RSV vaccine, she is going to get the Flu vaccine and was wandering if she needed the other one.

## 2023-01-30 DIAGNOSIS — H26493 Other secondary cataract, bilateral: Secondary | ICD-10-CM | POA: Diagnosis not present

## 2023-01-30 DIAGNOSIS — H5213 Myopia, bilateral: Secondary | ICD-10-CM | POA: Diagnosis not present

## 2023-01-30 DIAGNOSIS — H532 Diplopia: Secondary | ICD-10-CM | POA: Diagnosis not present

## 2023-02-06 DIAGNOSIS — H532 Diplopia: Secondary | ICD-10-CM | POA: Diagnosis not present

## 2023-02-06 DIAGNOSIS — H26493 Other secondary cataract, bilateral: Secondary | ICD-10-CM | POA: Diagnosis not present

## 2023-02-07 DIAGNOSIS — L821 Other seborrheic keratosis: Secondary | ICD-10-CM | POA: Diagnosis not present

## 2023-02-07 DIAGNOSIS — D1801 Hemangioma of skin and subcutaneous tissue: Secondary | ICD-10-CM | POA: Diagnosis not present

## 2023-02-07 DIAGNOSIS — L649 Androgenic alopecia, unspecified: Secondary | ICD-10-CM | POA: Diagnosis not present

## 2023-02-11 DIAGNOSIS — I7300 Raynaud's syndrome without gangrene: Secondary | ICD-10-CM | POA: Diagnosis not present

## 2023-02-11 DIAGNOSIS — M1991 Primary osteoarthritis, unspecified site: Secondary | ICD-10-CM | POA: Diagnosis not present

## 2023-02-11 DIAGNOSIS — M25572 Pain in left ankle and joints of left foot: Secondary | ICD-10-CM | POA: Diagnosis not present

## 2023-02-11 DIAGNOSIS — Z681 Body mass index (BMI) 19 or less, adult: Secondary | ICD-10-CM | POA: Diagnosis not present

## 2023-02-11 DIAGNOSIS — M0609 Rheumatoid arthritis without rheumatoid factor, multiple sites: Secondary | ICD-10-CM | POA: Diagnosis not present

## 2023-02-13 ENCOUNTER — Other Ambulatory Visit: Payer: Self-pay | Admitting: Internal Medicine

## 2023-02-13 ENCOUNTER — Ambulatory Visit: Payer: Medicare PPO | Admitting: Podiatry

## 2023-02-18 ENCOUNTER — Other Ambulatory Visit: Payer: Self-pay

## 2023-02-18 MED ORDER — METOPROLOL SUCCINATE ER 25 MG PO TB24: 25.0000 mg | ORAL_TABLET | Freq: Every day | ORAL | 1 refills | Status: DC

## 2023-02-26 NOTE — Progress Notes (Unsigned)
Cardiology Office Note:    Date:  02/27/2023   ID:  [name], DOB [birth year], MRN [id number]  PCP:  Margaree Mackintosh, MD  Cardiologist:  Little Ishikawa, MD  Electrophysiologist:  None   Referring MD: Margaree Mackintosh, MD   Chief Complaint  Patient presents with   Atrial Fibrillation    History of Present Illness:    [name] is a 82 y.o. female with a hx of  hyperlipidemia, rheumatoid arthritis, lymphocytic colitis, GERD who presents for follow-up.  She was initially seen on 04/03/2019, had been referred by Dr. Lenord Fellers for evaluation of new onset atrial fibrillation.  At initial clinic visit, she was noted to be in AF with rates 110s, was started on metoprolol 25 mg twice daily for rate control.  She was started on Eliquis 2.5 mg twice daily for anticoagulation.  TTE on 04/30/2019 showed EF 60 to 65%, normal RV function, severe left atrial dilatation, mild right atrial dilatation, mild MR, mild AI.  On 05/11/2019, she underwent a successful DCCV to restore sinus rhythm.  Following cardioversion, she reported that her heart rate would fall to the 40s, metoprolol dose was decreased to 12.5 mg twice daily.  At follow-up clinic on 07/03/2019, she was noted to be back in atrial fibrillation.  She was referred to the A. fib clinic, and rhythm control options were discussed but patient elected to continue with rate control strategy.  Zio patch x3 days on 12/02/2019 showed 100s in AF burden with average rate 78 bpm.  Calcium score 121 (50th percentile) on 01/09/2021.  Echocardiogram 09/2022 showed EF 55%, normal RV function, severe biatrial enlargement, moderate TR, moderate AI.  Since her last clinic visit, she reports she is doing well.  Denies any chest pain, dyspnea, or palpitations.  Reports lightheadedness has resolved.  Some swelling in left ankle.  She is taking Eliquis, denies any bleeding issues.  She is working out 3 times per week with a trainer and walks a few times per week for  about 20 minutes.   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   Lactose intolerance 10/16/2017   Lymphocytic colitis w/ patchy collagen deposition 09/28/2016   Rheumatoid arthritis (HCC)    Status post dilation of esophageal narrowing 2014   Wears glasses     Past Surgical History:  Procedure Laterality Date   CARDIOVERSION N/A 05/11/2019   Procedure: CARDIOVERSION;  Surgeon: Jodelle Red, MD;  Location: [facility] ENDOSCOPY;  Service: Cardiovascular;  Laterality: N/A;   CATARACT EXTRACTION  06/30/2016 -right eye   left eye in 08/15/16   COLONOSCOPY     DILATION AND CURETTAGE OF UTERUS  2009   ESOPHAGEAL DILATION  05/31/1997   REPAIR EXTENSOR TENDON Right 05/14/2013   Procedure: LONG FINGER EXTENSOR TO ELEVATE RING AND SMALL FINGERS ;  Surgeon: Wyn Forster., MD;  Location: [facility];  Service: Orthopedics;  Laterality: Right;   seborrheic keratosis  09/03/2012   inflamed, excised from forehead   TENDON TRANSFER Right 09/03/2013  Procedure: RIGHT HAND FLEXOR CARPI RADIALUS TRANSFER TO LONG/RING/SMALL;  Surgeon: Wyn Forster., MD;  Location: [facility];  Service: Orthopedics;  Laterality: Right;   TONSILLECTOMY     TOOTH EXTRACTION  05/2022   TUBAL LIGATION     ULNAR HEAD EXCISION Right 05/14/2013   Procedure: ARTHOPLASTY DISTAL RADIAL ULNAR JOINT, RIGHT ;  Surgeon: Wyn Forster., MD;  Location: [facility];  Service: Orthopedics;  Laterality: Right;   UPPER GASTROINTESTINAL ENDOSCOPY      Current Medications: Current Meds  Medication Sig    acetaminophen (TYLENOL) 500 MG tablet Take 500-1,000 mg by mouth every 8 (eight) hours as needed for moderate pain or headache.    apixaban (ELIQUIS) 2.5 MG TABS tablet Take 1 tablet (2.5 mg total) by mouth 2 (two) times daily.   atorvastatin (LIPITOR) 20 MG tablet TAKE 1 TABLET BY MOUTH THREE TIMES A WEEK   calcium carbonate (TUMS EX) 750 MG chewable tablet Chew 1,500 mg by mouth as needed.   Clobetasol Propionate 0.05 % shampoo Apply topically 3 (three) times a week.   diphenhydrAMINE (BENADRYL) 25 MG tablet Take 25 mg by mouth as needed.   diphenoxylate-atropine (LOMOTIL) 2.5-0.025 MG tablet Take 2 tablets by mouth 4 (four) times daily as needed for diarrhea or loose stools. Maximum 8 tablets daily   esomeprazole (NEXIUM) 40 MG capsule Take 1 capsule (40 mg total) by mouth daily before breakfast.   hydrocortisone cream 1 % Apply 1 Application topically daily as needed for itching (burns).   InFLIXimab (REMICADE IV) Inject 1 Dose into the vein See admin instructions. Remicade Infusion every 2 months   Ivermectin 1 % CREA 1 application to affected area   lactase (LACTAID) 3000 units tablet 1 tablet   loratadine (CLARITIN) 10 MG tablet    metoprolol succinate (TOPROL-XL) 25 MG 24 hr tablet Take 1 tablet (25 mg total) by mouth daily.   minoxidil (ROGAINE) 2 % external solution Apply 1 application topically 2 (two) times daily.   Multiple Vitamin (MULTIVITAMIN) tablet Take 1 tablet by mouth daily.   neomycin-bacitracin-polymyxin (NEOSPORIN) ointment Apply 1 application topically as needed for wound care.   nystatin-triamcinolone ointment (MYCOLOG) Apply 1 Application topically 2 (two) times daily.     Allergies:   Aspirin, Cox-2 inhibitors (sulfonamide), Glucose, Lactose intolerance (gi), Leflunomide, Methotrexate, Oxycodone-acetaminophen, Rosuvastatin, and Sulfa antibiotics   Social History   Socioeconomic History   Marital status: Widowed    Spouse name: Merlyn Albert   Number of children: 1    Years of education: 16   Highest education level: Not on file  Occupational History   Occupation: Retired- Dentist: RETIRED  Tobacco Use   Smoking status: Never   Smokeless tobacco: Never  Vaping Use   Vaping status: Never Used  Substance and Sexual Activity   Alcohol use: Yes    Alcohol/week: 21.0 standard drinks of alcohol    Types: 21 Standard drinks or equivalent per week    Comment: 3 times weekly   Drug use: No   Sexual activity: Not on file  Other Topics Concern   Not on file  Social History Narrative   Health Care POA: Husband, FredEmergency Contact: FredEnd of Life Plan: Who lives with you: husbandSon, [place] pets: 3 catsDiet: Pt has a varied diet of protein, starch, and vegetables.Exercise: Pt works with trainer 2x a week for 60 minutesSeatbelts: Pt reports wearing seatbelt when in vehicle.Wynelle Link Exposure/Protection: Pt reports not using sun  protection.Hobbies: gardening, reading, concerts      Widowed 05/19/2022-moved to [place] 05/2022   Social Determinants of Health   Financial Resource Strain: Not on file  Food Insecurity: Not on file  Transportation Needs: Not on file  Physical Activity: Not on file  Stress: Not on file  Social Connections: Not on file     Family History: The patient's family history includes COPD in her father; Colon cancer in her paternal uncle; Heart disease in her mother; Osteoporosis in her maternal aunt, maternal grandmother, and mother. There is no history of Esophageal cancer, Pancreatic cancer, Rectal cancer, or Stomach cancer.  ROS:   Please see the history of present illness.    A whole 8 I get asked the minimum of once a day bowel dialysis I assessment all other systems reviewed and are negative.  EKGs/Labs/Other Studies Reviewed:    The following studies were reviewed today:   EKG:   10/24/2021: Atrial fibrillation, rate 83 04/11/22: Afib, rate 89 02/27/2023: Atrial fibrillation, rate 67  Recent  Labs: 03/26/2022: ALT 19; BUN 17; Creat 0.87; Hemoglobin 14.2; Platelets 215; Potassium 5.0; Sodium 140; TSH 3.30  Recent Lipid Panel    Component Value Date/Time   CHOL 235 (H) 03/26/2022 0950   TRIG 87 03/26/2022 0950   HDL 121 03/26/2022 0950   CHOLHDL 1.9 03/26/2022 0950   VLDL 19 08/03/2016 1001   LDLCALC 96 03/26/2022 0950   LDLDIRECT 149 (H) 11/11/2007 2043   TTE 04/30/19: 1. Left ventricular ejection fraction, by visual estimation, is 60 to  65%. The left ventricle has normal function. Left ventricular septal wall  thickness was normal. Normal left ventricular posterior wall thickness.  There is no left ventricular  hypertrophy.   2. Left ventricular diastolic parameters are indeterminate.   3. The left ventricle has no regional wall motion abnormalities.   4. Global right ventricle has normal systolic function.The right  ventricular size is normal. No increase in right ventricular wall  thickness.   5. Left atrial size was severely dilated.   6. Right atrial size was mildly dilated.   7. The mitral valve is normal in structure. Mild mitral valve  regurgitation. No evidence of mitral stenosis.   8. The tricuspid valve is normal in structure.   9. The aortic valve is tricuspid. Aortic valve regurgitation is mild. No  evidence of aortic valve sclerosis or stenosis.  10. The pulmonic valve was normal in structure. Pulmonic valve  regurgitation is not visualized.  11. Normal pulmonary artery systolic pressure.  12. The inferior vena cava is dilated in size with >50% respiratory  variability, suggesting right atrial pressure of 8 mmHg.   Physical Exam:    VS:  BP 104/60 (BP Location: Left Arm, Patient Position: Sitting, Cuff Size: Normal)   Pulse 67   Ht 5\' 5"  (1.651 m)   Wt 115 lb (52.2 kg)   SpO2 91%   BMI 19.14 kg/m     Wt Readings from Last 3 Encounters:  02/27/23 115 lb (52.2 kg)  11/12/22 115 lb 12.8 oz (52.5 kg)  09/20/22 109 lb (49.4 kg)     GEN: in no  acute distress HEENT: Normal NECK: No JVD CARDIAC: irregular,  no murmurs, rubs, gallops RESPIRATORY:  Clear to auscultation without rales, wheezing or rhonchi  ABDOMEN: Soft, non-tender, non-distended MUSCULOSKELETAL:  No edema; No deformity  SKIN: Warm and dry NEUROLOGIC:  Alert and oriented x 3 PSYCHIATRIC:  Normal affect   ASSESSMENT:    1. Permanent  atrial fibrillation (HCC)   2. Aortic valve insufficiency, etiology of cardiac valve disease unspecified   3. Dizziness   4. Hyperlipidemia, unspecified hyperlipidemia type   5. Elevated coronary artery calcium score      PLAN:     Permanent atrial fibrillation: CHADS-VASc score 3 (agex2, female).  Appears rate controlled on metoprolol.  Successful DCCV on 05/11/2019, however was back in atrial fibrillation at follow-up appointment on 07/03/2019.  TTE shows normal LV systolic function, severe left atrial dilatation.  She was seen in AF clinic and decided to pursue rate control strategy as rates well controlled and appears asymptomatic.  Zio patch x3 days on 12/02/2019 showed 100% AF burden with average rate 78 bpm. -Continue Eliquis.  Given age>80 and weight <60kg, meets indication for reduced dose.  Will continue 2.5 mg twice daily -Continue Toprol-XL 25 mg daily   Aortic regurgitation: Moderate on echo 09/2022.  Will monitor, plan repeat echocardiogram 09/2023  Hyperlipidemia: LDL 118 05/2199, she started on rosuvastatin 5 mg three times weekly and LDL improved to 59 02/2020.  She stopped taking the rosuvastatin.  Calcium score 121 (50th percentile) on 01/09/2021.  Restarted atorvastatin 20 mg 3 times weekly.  LDL 96 on 03/26/2022.  Also with excellent HDL (121).  Pulmonary nodules: Bilateral small pulmonary nodules noted on calcium score 12/2020.  Stable on CT chest 12/2021  RTC in 6 months   Medication Adjustments/Labs and Tests Ordered: Current medicines are reviewed at length with the patient today.  Concerns regarding medicines are  outlined above.  Orders Placed This Encounter  Procedures   EKG 12-Lead    No orders of the defined types were placed in this encounter.    Patient Instructions  Medication Instructions:  Your physician recommends that you continue on your current medications as directed. Please refer to the Current Medication list given to you today.  *If you need a refill on your cardiac medications before your next appointment, please call your pharmacy*   Follow-Up: At [facility], you and your health needs are our priority.  As part of our continuing mission to provide you with exceptional heart care, we have created designated Provider Care Teams.  These Care Teams include your primary Cardiologist (physician) and Advanced Practice Providers (APPs -  Physician Assistants and Nurse Practitioners) who all work together to provide you with the care you need, when you need it.  We recommend signing up for the patient portal called "MyChart".  Sign up information is provided on this After Visit Summary.  MyChart is used to connect with patients for Virtual Visits (Telemedicine).  Patients are able to view lab/test results, encounter notes, upcoming appointments, etc.  Non-urgent messages can be sent to your provider as well.   To learn more about what you can do with MyChart, go to [email or web address].    Your next appointment:   6 month(s)  Provider:   Little Ishikawa, MD      Signed, Little Ishikawa, MD  02/27/2023 5:28 PM    [facility]

## 2023-02-27 ENCOUNTER — Ambulatory Visit: Payer: Medicare PPO | Attending: Cardiology | Admitting: Cardiology

## 2023-02-27 ENCOUNTER — Encounter: Payer: Self-pay | Admitting: Cardiology

## 2023-02-27 VITALS — BP 104/60 | HR 67 | Ht 65.0 in | Wt 115.0 lb

## 2023-02-27 DIAGNOSIS — I351 Nonrheumatic aortic (valve) insufficiency: Secondary | ICD-10-CM | POA: Diagnosis not present

## 2023-02-27 DIAGNOSIS — R931 Abnormal findings on diagnostic imaging of heart and coronary circulation: Secondary | ICD-10-CM

## 2023-02-27 DIAGNOSIS — E785 Hyperlipidemia, unspecified: Secondary | ICD-10-CM | POA: Diagnosis not present

## 2023-02-27 DIAGNOSIS — R42 Dizziness and giddiness: Secondary | ICD-10-CM | POA: Diagnosis not present

## 2023-02-27 DIAGNOSIS — I4821 Permanent atrial fibrillation: Secondary | ICD-10-CM | POA: Diagnosis not present

## 2023-02-27 NOTE — Patient Instructions (Signed)
Medication Instructions:  Your physician recommends that you continue on your current medications as directed. Please refer to the Current Medication list given to you today.  *If you need a refill on your cardiac medications before your next appointment, please call your pharmacy*  Follow-Up: At [facility], you and your health needs are our priority.  As part of our continuing mission to provide you with exceptional heart care, we have created designated Provider Care Teams.  These Care Teams include your primary Cardiologist (physician) and Advanced Practice Providers (APPs -  Physician Assistants and Nurse Practitioners) who all work together to provide you with the care you need, when you need it.  We recommend signing up for the patient portal called "MyChart".  Sign up information is provided on this After Visit Summary.  MyChart is used to connect with patients for Virtual Visits (Telemedicine).  Patients are able to view lab/test results, encounter notes, upcoming appointments, etc.  Non-urgent messages can be sent to your provider as well.   To learn more about what you can do with MyChart, go to [email or web address].    Your next appointment:   6 month(s)  Provider:   Christopher L Schumann, MD     

## 2023-03-04 DIAGNOSIS — Z79899 Other long term (current) drug therapy: Secondary | ICD-10-CM | POA: Diagnosis not present

## 2023-03-04 DIAGNOSIS — M0609 Rheumatoid arthritis without rheumatoid factor, multiple sites: Secondary | ICD-10-CM | POA: Diagnosis not present

## 2023-03-04 DIAGNOSIS — R5383 Other fatigue: Secondary | ICD-10-CM | POA: Diagnosis not present

## 2023-03-04 DIAGNOSIS — Z111 Encounter for screening for respiratory tuberculosis: Secondary | ICD-10-CM | POA: Diagnosis not present

## 2023-03-11 ENCOUNTER — Ambulatory Visit (INDEPENDENT_AMBULATORY_CARE_PROVIDER_SITE_OTHER): Payer: Medicare PPO | Admitting: Family Medicine

## 2023-03-11 DIAGNOSIS — M19072 Primary osteoarthritis, left ankle and foot: Secondary | ICD-10-CM

## 2023-03-11 NOTE — Progress Notes (Signed)
Pt here today to get 2 pair of the hapad medial/lateral heel lifts (product code: LW2) for her shoes. She has had good success with them and wanted to have them for several pairs of her dress shoes and sneakers.

## 2023-03-26 NOTE — Progress Notes (Signed)
Annual Wellness Visit    Patient Care Team: Chin Wachter, Luanna Cole, MD as PCP - General (Internal Medicine) Little Ishikawa, MD as PCP - Cardiology (Cardiology) Enid Baas, MD (Family Medicine) Dorena Cookey, MD (Inactive) (Gastroenterology) Mckinley Jewel, MD (Ophthalmology) Osborn Coho, MD (Obstetrics and Gynecology) Leta Speller, MD (Dermatology) Cheral [facility] (Dental General Practice)  Visit Date: 04/02/23   Chief Complaint  Patient presents with   Medicare Wellness    Subjective:   Patient: [name], Female    DOB: [birth year], 82 y.o.   MRN: [id number]  [name] is a 82 y.o. Female who presents today for her Annual Wellness Visit. 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  History of hypertension treated with metoprolol succinate 25 mg daily. Blood pressure normal in-office today at 100/60.  History of hyperlipidemia treated with atorvastatin 20 mg three times weekly. HDL at 132.  History of GERD treated with esomeprazole 40 mg daily.  History of permanent atrial fibrillation and is on Eliquis. 04/30/2019 TTE shows normal LV systolic function, severe left atrial dilatation. Zio patch x3 days on 12/02/2019 showed 100% AF burden with average rate 78 bpm.   History of IBS-D, lymphocytic colitis with patchy collagen deposition, suspected Celiac, lactose intolerance. Seen by Dr. Leone Payor and is on Lomotil, Nexium. Last budesonide treatment 2020.  History of Rheumatoid Arthritis treated with IV Remicade by [facility] Rhe[facility].  History of hearing loss and tinnitus.  Was diagnosed with new onset atrial fibrillation in November 2020.  Had cardioversion in January 2021 and subsequently went back into atrial.  Rate was controlled.  Echocardiogram in December  2020 indicated she had left atrial size severely dilated and right atrial size mildly dilated.  Chads 2 score was 30.  She is on Eliquis.   In January 2015 she had surgery by Dr. 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  Remote history of plantar fasciitis.  History of mild osteopenia based on bone density study.   History of environmental allergies.   04/01/23 labs reviewed today. Glucose normal. Kidney, liver functions normal. Electrolytes normal. Blood proteins normal. MCV elevated at 100.5. MCH elevated at 33.3. TSH at 2.18.  Mammogram normal on 10/08/22.  Last colonoscopy in 2018.  Last bone density study at [facility] was 2016. Copy is in Epic. Defer to [facility] Rheumatology.   Social history: She has a Education officer, community.  1 adult son who lives in [place].  She is married to Alm Bustard, well-known to Clinical research associate and  poet.  Social alcohol consumption.  Does not smoke.  Family history: Father died at age 32 with complications of COPD.  Mother died at age 43 of heart failure.  No brothers or sisters.   Past Medical History:  Diagnosis Date   A-fib [facility])    Allergy    seasonal   Anemia    Cataract    bil cateracts removed   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 10/16/2017   GERD (gastroesophageal reflux disease)    Hyperlipidemia    Irritable bowel syndrome with diarrhea 07/16/2022   Lactose intolerance 10/16/2017   Lymphocytic colitis w/ patchy collagen deposition 09/28/2016   Rheumatoid arthritis (HCC)    Status post dilation of  esophageal narrowing 2014   Wears glasses      Family History  Problem Relation Age of Onset   Heart disease Mother    Osteoporosis Mother    COPD Father    Osteoporosis Maternal Grandmother    Colon cancer Paternal Uncle    Osteoporosis Maternal Aunt    Esophageal  cancer Neg Hx    Pancreatic cancer Neg Hx    Rectal cancer Neg Hx    Stomach cancer Neg Hx      Social History   Social History Narrative   Health Care POA: Husband, FredEmergency Contact: FredEnd of Life Plan: Who lives with you: husbandSon, in [place] pets: 3 catsDiet: Pt has a varied diet of protein, starch, and vegetables.Exercise: Pt works with trainer 2x a week for 60 minutesSeatbelts: Pt reports wearing seatbelt when in vehicle.Sun Exposure/Protection: Pt reports not using sun protection.Hobbies: gardening, reading, concerts      Widowed 05/19/2022-moved to [facility] 05/2022     Review of Systems  Constitutional:  Negative for chills, fever, malaise/fatigue and weight loss.  HENT:  Negative for hearing loss, sinus pain and sore throat.   Respiratory:  Negative for cough, hemoptysis and shortness of breath.   Cardiovascular:  Negative for chest pain, palpitations, leg swelling and PND.  Gastrointestinal:  Negative for abdominal pain, constipation, diarrhea, heartburn, nausea and vomiting.  Genitourinary:  Negative for dysuria, frequency and urgency.  Musculoskeletal:  Negative for back pain, myalgias and neck pain.  Skin:  Negative for itching and rash.  Neurological:  Negative for dizziness, tingling, seizures and headaches.  Endo/Heme/Allergies:  Negative for polydipsia.  Psychiatric/Behavioral:  Negative for depression. The patient is not nervous/anxious.       Objective:   Vitals: BP 100/60   Pulse 79   Ht 5\' 4"  (1.626 m)   Wt 117 lb (53.1 kg)   SpO2 97%   BMI 20.08 kg/m   Physical Exam Vitals and nursing note reviewed.  Constitutional:      General: She is not in acute distress.    Appearance: Normal appearance. She is not ill-appearing or toxic-appearing.  HENT:     Head: Normocephalic and atraumatic.     Right Ear: Hearing, tympanic membrane, ear canal and external ear normal.     Left Ear: Hearing, tympanic membrane, ear canal and external ear normal.      Mouth/Throat:     Pharynx: Oropharynx is clear.  Eyes:     Extraocular Movements: Extraocular movements intact.     Pupils: Pupils are equal, round, and reactive to light.  Neck:     Thyroid: No thyroid mass, thyromegaly or thyroid tenderness.     Vascular: No carotid bruit.  Cardiovascular:     Rate and Rhythm: Normal rate and regular rhythm. No extrasystoles are present.    Pulses:          Dorsalis pedis pulses are 1+ on the right side and 1+ on the left side.     Heart sounds: Normal heart sounds. No murmur heard.    No friction rub. No gallop.  Pulmonary:     Effort: Pulmonary effort is normal.     Breath sounds: Normal breath sounds. No decreased breath sounds, wheezing, rhonchi or rales.  Chest:     Chest wall: No mass.  Breasts:    Right: No mass.     Left: No mass.  Abdominal:     Palpations: Abdomen is soft. There is no hepatomegaly, splenomegaly or mass.  Tenderness: There is no abdominal tenderness.     Hernia: No hernia is present.  Musculoskeletal:     Cervical back: Normal range of motion.     Right lower leg: No edema.     Left lower leg: No edema.  Lymphadenopathy:     Cervical: No cervical adenopathy.     Upper Body:     Right upper body: No supraclavicular adenopathy.     Left upper body: No supraclavicular adenopathy.  Skin:    General: Skin is warm and dry.  Neurological:     General: No focal deficit present.     Mental Status: She is alert and oriented to person, place, and time. Mental status is at baseline.     Sensory: Sensation is intact.     Motor: Motor function is intact. No weakness.     Deep Tendon Reflexes: Reflexes are normal and symmetric.  Psychiatric:        Attention and Perception: Attention normal.        Mood and Affect: Mood normal.        Speech: Speech normal.        Behavior: Behavior normal.        Thought Content: Thought content normal.        Cognition and Memory: Cognition normal.        Judgment: Judgment  normal.      Most recent functional status assessment:    04/02/2023    2:10 PM  In your present state of health, do you have any difficulty performing the following activities:  Hearing? 0  Vision? 0  Difficulty concentrating or making decisions? 0  Walking or climbing stairs? 0  Dressing or bathing? 0  Doing errands, shopping? 0  Preparing Food and eating ? N  Using the Toilet? N  In the past six months, have you accidently leaked urine? N  Do you have problems with loss of bowel control? N  Managing your Medications? N  Managing your Finances? N  Housekeeping or managing your Housekeeping? N   Most recent fall risk assessment:    04/02/2023    2:11 PM  Fall Risk   Falls in the past year? 0  Number falls in past yr: 0  Injury with Fall? 0  Risk for fall due to : No Fall Risks  Follow up Falls evaluation completed;Education provided;Falls prevention discussed    Most recent depression screenings:    04/02/2023    2:11 PM 03/30/2022   11:09 AM  PHQ 2/9 Scores  PHQ - 2 Score 2 0  PHQ- 9 Score 2    Most recent cognitive screening:    04/02/2023    2:12 PM  6CIT Screen  What Year? 0 points  What month? 0 points  What time? 0 points  Count back from 20 0 points  Months in reverse 0 points  Repeat phrase 0 points  Total Score 0 points     Results:   Studies obtained and personally reviewed by me:  Mammogram normal on 10/08/22.  Last colonoscopy in 2018.  Last bone density study at [facility] was 2016. Copy is in Epic. Defer to [facility] Rheumatology.   Labs:       Component Value Date/Time   NA 141 04/01/2023 0935   NA 141 05/08/2019 0849   K 4.0 04/01/2023 0935   CL 104 04/01/2023 0935   CO2 27 04/01/2023 0935   GLUCOSE 85 04/01/2023 0935   BUN 13 04/01/2023 0935  BUN 15 05/08/2019 0849   CREATININE 0.66 04/01/2023 0935   CALCIUM 9.2 04/01/2023 0935   PROT 7.4 04/01/2023 0935   ALBUMIN 3.9 08/03/2016 1001   AST 23 04/01/2023 0935   ALT 18  04/01/2023 0935   ALKPHOS 45 08/03/2016 1001   BILITOT 0.7 04/01/2023 0935   GFRNONAA 79 03/15/2020 1107   GFRAA 91 03/15/2020 1107     Lab Results  Component Value Date   WBC 5.1 04/01/2023   HGB 14.4 04/01/2023   HCT 43.4 04/01/2023   MCV 100.5 (H) 04/01/2023   PLT 187 04/01/2023    Lab Results  Component Value Date   CHOL 234 (H) 04/01/2023   HDL 132 04/01/2023   LDLCALC 86 04/01/2023   LDLDIRECT 149 (H) 11/11/2007   TRIG 73 04/01/2023   CHOLHDL 1.8 04/01/2023    No results found for: "HGBA1C"   Lab Results  Component Value Date   TSH 2.18 04/01/2023    Assessment & Plan:   A-fib treated with metoprolol succinate 25 mg daily for rate control and chronic anticoagulation Blood pressure normal in-office today at 100/60.  Hyperlipidemia: treated with atorvastatin 20 mg three times weekly. HDL at 132.  Lymphocytic colitis- seen by Dr. Leone Payor  Lactose intolerance  GERD treated with PPI s/p esopnageal dilatation 2014  Hx of irritable bowel syndrome with diarrhea  Rheumatoid arthritis: treated with IV Remicade by [facility] Rheumat[facility].  Chronic anticoagulation on A-fib  History of hearing loss and tinnitus.  Pelvic exam deferred.   Mammogram normal on 10/08/22.  Last colonoscopy in 2018.  Last bone density study at [facility] was 2016. Copy is in Epic. Defer to [facility] Rheumatology.   Vaccine counseling: UTD on high-dose flu, pneumococcal 20 vaccines.  Return in 1 year or as needed.     Annual wellness visit done today including the all of the following: Reviewed patient's Family Medical History Reviewed and updated list of patient's medical providers Assessment of cognitive impairment was done Assessed patient's functional ability Established a written schedule for health screening services Health Risk Assessent Completed and Reviewed  Discussed health benefits of physical activity, and encouraged her to engage in regular exercise appropriate for  her age and condition.        I,Alexander Ruley,acting as a Neurosurgeon for Margaree Mackintosh, MD.,have documented all relevant documentation on the behalf of Margaree Mackintosh, MD,as directed by  Margaree Mackintosh, MD while in the presence of Margaree Mackintosh, MD.   I, Margaree Mackintosh, MD, have reviewed all documentation for this visit. The documentation on 04/25/23 for the exam, diagnosis, procedures, and orders are all accurate and complete.

## 2023-04-01 ENCOUNTER — Other Ambulatory Visit: Payer: Medicare PPO

## 2023-04-01 DIAGNOSIS — Z0000 Encounter for general adult medical examination without abnormal findings: Secondary | ICD-10-CM

## 2023-04-01 DIAGNOSIS — M810 Age-related osteoporosis without current pathological fracture: Secondary | ICD-10-CM | POA: Diagnosis not present

## 2023-04-01 DIAGNOSIS — M8580 Other specified disorders of bone density and structure, unspecified site: Secondary | ICD-10-CM | POA: Diagnosis not present

## 2023-04-01 DIAGNOSIS — R718 Other abnormality of red blood cells: Secondary | ICD-10-CM

## 2023-04-02 ENCOUNTER — Encounter: Payer: Self-pay | Admitting: Internal Medicine

## 2023-04-02 ENCOUNTER — Ambulatory Visit: Payer: Medicare PPO | Admitting: Internal Medicine

## 2023-04-02 VITALS — BP 100/60 | HR 79 | Ht 64.0 in | Wt 117.0 lb

## 2023-04-02 DIAGNOSIS — Z0000 Encounter for general adult medical examination without abnormal findings: Secondary | ICD-10-CM | POA: Diagnosis not present

## 2023-04-02 DIAGNOSIS — E739 Lactose intolerance, unspecified: Secondary | ICD-10-CM

## 2023-04-02 DIAGNOSIS — M8580 Other specified disorders of bone density and structure, unspecified site: Secondary | ICD-10-CM | POA: Diagnosis not present

## 2023-04-02 DIAGNOSIS — K52832 Lymphocytic colitis: Secondary | ICD-10-CM

## 2023-04-02 DIAGNOSIS — E7800 Pure hypercholesterolemia, unspecified: Secondary | ICD-10-CM

## 2023-04-02 DIAGNOSIS — H903 Sensorineural hearing loss, bilateral: Secondary | ICD-10-CM

## 2023-04-02 DIAGNOSIS — I4821 Permanent atrial fibrillation: Secondary | ICD-10-CM

## 2023-04-02 DIAGNOSIS — Z7901 Long term (current) use of anticoagulants: Secondary | ICD-10-CM

## 2023-04-02 DIAGNOSIS — Z8739 Personal history of other diseases of the musculoskeletal system and connective tissue: Secondary | ICD-10-CM | POA: Diagnosis not present

## 2023-04-02 DIAGNOSIS — Z634 Disappearance and death of family member: Secondary | ICD-10-CM

## 2023-04-02 DIAGNOSIS — R7989 Other specified abnormal findings of blood chemistry: Secondary | ICD-10-CM

## 2023-04-02 DIAGNOSIS — K219 Gastro-esophageal reflux disease without esophagitis: Secondary | ICD-10-CM

## 2023-04-02 NOTE — Addendum Note (Signed)
Addended by: Gregery Na on: 04/02/2023 04:18 PM   Modules accepted: Orders

## 2023-04-03 ENCOUNTER — Telehealth: Payer: Self-pay | Admitting: Cardiology

## 2023-04-03 MED ORDER — ATORVASTATIN CALCIUM 20 MG PO TABS: 20.0000 mg | ORAL_TABLET | ORAL | 3 refills | Status: DC

## 2023-04-03 NOTE — Telephone Encounter (Signed)
*  STAT* If patient is at the pharmacy, call can be transferred to refill team.   1. Which medications need to be refilled? (please list name of each medication and dose if known) atorvastatin (LIPITOR) 20 MG tablet   2. Which pharmacy/location (including street and city if local pharmacy) is medication to be sent to? [id number] [place], [place] - [address] Phone: [phone]  Fax: [phone]      3. Do they need a 30 day or 90 day supply? 90

## 2023-04-03 NOTE — Telephone Encounter (Signed)
 Rx sent to requested Pharmacy.

## 2023-04-06 LAB — CBC WITH DIFFERENTIAL/PLATELET
Absolute Lymphocytes: 2402 {cells}/uL (ref 850–3900)
Absolute Monocytes: 428 {cells}/uL (ref 200–950)
Basophils Absolute: 31 {cells}/uL (ref 0–200)
Basophils Relative: 0.6 %
Eosinophils Absolute: 377 {cells}/uL (ref 15–500)
Eosinophils Relative: 7.4 %
HCT: 43.4 % (ref 35.0–45.0)
Hemoglobin: 14.4 g/dL (ref 11.7–15.5)
MCH: 33.3 pg — ABNORMAL HIGH (ref 27.0–33.0)
MCHC: 33.2 g/dL (ref 32.0–36.0)
MCV: 100.5 fL — ABNORMAL HIGH (ref 80.0–100.0)
MPV: 10 fL (ref 7.5–12.5)
Monocytes Relative: 8.4 %
Neutro Abs: 1862 {cells}/uL (ref 1500–7800)
Neutrophils Relative %: 36.5 %
Platelets: 187 10*3/uL (ref 140–400)
RBC: 4.32 10*6/uL (ref 3.80–5.10)
RDW: 12.6 % (ref 11.0–15.0)
Total Lymphocyte: 47.1 %
WBC: 5.1 10*3/uL (ref 3.8–10.8)

## 2023-04-06 LAB — COMPLETE METABOLIC PANEL WITH GFR
AG Ratio: 1.5 (calc) (ref 1.0–2.5)
ALT: 18 U/L (ref 6–29)
AST: 23 U/L (ref 10–35)
Albumin: 4.4 g/dL (ref 3.6–5.1)
Alkaline phosphatase (APISO): 47 U/L (ref 37–153)
BUN: 13 mg/dL (ref 7–25)
CO2: 27 mmol/L (ref 20–32)
Calcium: 9.2 mg/dL (ref 8.6–10.4)
Chloride: 104 mmol/L (ref 98–110)
Creat: 0.66 mg/dL (ref 0.60–0.95)
Globulin: 3 g/dL (ref 1.9–3.7)
Glucose, Bld: 85 mg/dL (ref 65–99)
Potassium: 4 mmol/L (ref 3.5–5.3)
Sodium: 141 mmol/L (ref 135–146)
Total Bilirubin: 0.7 mg/dL (ref 0.2–1.2)
Total Protein: 7.4 g/dL (ref 6.1–8.1)
eGFR: 86 mL/min/{1.73_m2} (ref >=60)

## 2023-04-06 LAB — LIPID PANEL
Cholesterol: 234 mg/dL — ABNORMAL HIGH (ref <200)
HDL: 132 mg/dL (ref >=50)
LDL Cholesterol (Calc): 86 mg/dL
Non-HDL Cholesterol (Calc): 102 mg/dL (ref <130)
Total CHOL/HDL Ratio: 1.8 (calc) (ref <5.0)
Triglycerides: 73 mg/dL (ref <150)

## 2023-04-06 LAB — FOLATE: Folate: 17.3 ng/mL

## 2023-04-06 LAB — TSH: TSH: 2.18 m[IU]/L (ref 0.40–4.50)

## 2023-04-06 LAB — VITAMIN B12: Vitamin B-12: 1013 pg/mL (ref 200–1100)

## 2023-04-08 ENCOUNTER — Other Ambulatory Visit: Payer: Self-pay | Admitting: Internal Medicine

## 2023-04-25 ENCOUNTER — Encounter: Payer: Self-pay | Admitting: Internal Medicine

## 2023-04-25 NOTE — Patient Instructions (Signed)
It was a pleasure to see you today. Labs are stable. I know it has been a difficult year for you. Please call at any time. Return for annual visit in one year or as needed.

## 2023-04-29 ENCOUNTER — Other Ambulatory Visit: Payer: Self-pay | Admitting: Internal Medicine

## 2023-04-29 NOTE — Telephone Encounter (Signed)
Please advise [name], last seen 08/2022.

## 2023-05-09 ENCOUNTER — Telehealth: Payer: Self-pay | Admitting: Internal Medicine

## 2023-05-09 DIAGNOSIS — M0609 Rheumatoid arthritis without rheumatoid factor, multiple sites: Secondary | ICD-10-CM | POA: Diagnosis not present

## 2023-05-09 DIAGNOSIS — Z79899 Other long term (current) drug therapy: Secondary | ICD-10-CM | POA: Diagnosis not present

## 2023-05-09 DIAGNOSIS — Z111 Encounter for screening for respiratory tuberculosis: Secondary | ICD-10-CM | POA: Diagnosis not present

## 2023-05-09 NOTE — Telephone Encounter (Signed)
 Appointment set up for 05/22/2023, patient aware of date/time.

## 2023-05-09 NOTE — Telephone Encounter (Signed)
 Inbound call from patient stating she needed to make an appointment with Dr. Leone [name]. Patient was scheduled for 3/25 at 3:30. Patient also requested to speak with PJ, patient is having severe diarrhea. Please advise.

## 2023-05-09 NOTE — Telephone Encounter (Signed)
 I spoke with [name] and she is having multiple spells of diarrhea a day since before Christmas. She is taking more Lomotil  than is is supposed to she said. Taking 4-5 doses a day of multiple tablets- 3pills, then 2 pills, then 3 pills. On average she is having 4 spells of the diarrhea. No fever.  Please advise. I offered to come in today but she has an infusion and can't. She has a March appointment.

## 2023-05-09 NOTE — Telephone Encounter (Signed)
 Worker in into a hold spot for nurse visit or convert a banding spot

## 2023-05-22 ENCOUNTER — Ambulatory Visit: Payer: Medicare PPO | Admitting: Internal Medicine

## 2023-05-22 ENCOUNTER — Ambulatory Visit: Payer: Medicare PPO | Admitting: Podiatry

## 2023-05-22 ENCOUNTER — Encounter: Payer: Self-pay | Admitting: Internal Medicine

## 2023-05-22 VITALS — BP 100/58 | HR 83 | Ht 64.0 in | Wt 112.0 lb

## 2023-05-22 DIAGNOSIS — K580 Irritable bowel syndrome with diarrhea: Secondary | ICD-10-CM | POA: Diagnosis not present

## 2023-05-22 DIAGNOSIS — K52832 Lymphocytic colitis: Secondary | ICD-10-CM

## 2023-05-22 DIAGNOSIS — K900 Celiac disease: Secondary | ICD-10-CM

## 2023-05-22 DIAGNOSIS — E739 Lactose intolerance, unspecified: Secondary | ICD-10-CM

## 2023-05-22 DIAGNOSIS — K219 Gastro-esophageal reflux disease without esophagitis: Secondary | ICD-10-CM

## 2023-05-22 NOTE — Progress Notes (Signed)
[name] 82 y.o. [birth year] [id number]  Assessment & Plan:   Encounter Diagnoses  Name Primary?   Irritable bowel syndrome with diarrhea Yes   Lymphocytic colitis w/ patchy collagen deposition    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    Lactose intolerance    Gastroesophageal reflux disease, unspecified whether esophagitis present    She will continue her current regimen.  It is a bit atypical but if she takes 3 generic Lomotil every other day her quality of life and bowel habits are good.  She will continue to avoid gluten as celiac disease is suspected.  Avoid dairy as well with the lactose intolerance.  Should she have another sustained episode would need to consider retrial of budesonide  It is plausible that situational stress associated with first holiday season without her husband contributed to her IBS flare.  Continue PPI for GERD.  She may return here in 1 year routinely or sooner as needed.   Subjective:   Chief Complaint: Diarrhea and IBS follow-up.  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  She had trouble getting it under control.  She was stressed because it was her first holiday season without her husband whom she had known since the age of 28 and thinks that had some role in this.  She had a depressed mood during that time as well.  She has settled in on taking 3 generic Lomotil every other day and she has "no problems with that regimen".  She is tolerating life with a decent quality of life at this point.  She has not had any flares of her rheumatoid arthritis.  She continues on infliximab for that.  There was some crampy abdominal pain and gas symptoms as well those are much better also.  She does  not complain of active GERD symptoms today.  Last colonoscopy 09/20/2016-diverticulosis, normal mucosa but biopsies revealed lymphocytic colitis with patchy collagen deposition she was treated successfully with budesonide after this though she has had persistent IBS problems since major improvement with budesonide.  Wt Readings from Last 3 Encounters:  05/22/23 112 lb (50.8 kg)  04/02/23 117 lb (53.1 kg)  02/27/23 115 lb (52.2 kg)   Lab Results  Component Value Date   WBC 5.1 04/01/2023   HGB 14.4 04/01/2023   HCT 43.4 04/01/2023   MCV 100.5 (H) 04/01/2023   PLT 187 04/01/2023     Chemistry      Component Value Date/Time   NA 141 04/01/2023 0935   NA 141 05/08/2019 0849   K 4.0 04/01/2023 0935   CL 104 04/01/2023 0935   CO2 27 04/01/2023 0935   BUN 13 04/01/2023 0935   BUN 15 05/08/2019 0849   CREATININE 0.66 04/01/2023 0935      Component Value Date/Time   CALCIUM 9.2 04/01/2023 0935   ALKPHOS 45 08/03/2016 1001   AST 23 04/01/2023 0935   ALT 18 04/01/2023 0935   BILITOT 0.7 04/01/2023 0935     Lab Results  Component Value Date   TSH 2.18 04/01/2023     Allergies  Allergen Reactions   Aspirin     Other reaction(s): GI upset   Cox-2 Inhibitors (Sulfonamide) Nausea And Vomiting    Severe abd pain   Glucose Diarrhea  cramps   Lactose Intolerance (Gi) Diarrhea    Stomach cramps   Leflunomide     Other reaction(s): hairloss   Methotrexate     Other reaction(s): hairloss   Oxycodone-Acetaminophen     Other reaction(s): Unknown   Rosuvastatin Diarrhea   Sulfa Antibiotics     Unknown as child   Current Meds  Medication Sig   acetaminophen (TYLENOL) 500 MG tablet Take 500-1,000 mg by mouth every 8 (eight) hours as needed for moderate pain or headache.    apixaban (ELIQUIS) 2.5 MG TABS tablet Take 1 tablet (2.5 mg total) by mouth 2 (two) times daily.   atorvastatin (LIPITOR) 20 MG tablet Take 1 tablet (20 mg total) by mouth 3 (three) times a week.   calcium  carbonate (TUMS EX) 750 MG chewable tablet Chew 1,500 mg by mouth as needed.   Clobetasol Propionate 0.05 % shampoo Apply topically 3 (three) times a week.   diphenhydrAMINE (BENADRYL) 25 MG tablet Take 25 mg by mouth as needed.   diphenoxylate-atropine (LOMOTIL) 2.5-0.025 MG tablet TAKE 2 TABLETS BY MOUTH 4 TIMES DAILY AS NEEDED FOR DIARRHEA OR LOOSE STOOLS MAXIMUM 8 TABLETS DAILY (Patient taking differently: Take 3 tablets by mouth daily.)   esomeprazole (NEXIUM) 40 MG capsule TAKE 1 CAPSULE BY MOUTH EVERY DAY BEFORE BREAKFAST   hydrocortisone cream 1 % Apply 1 Application topically daily as needed for itching (burns).   InFLIXimab (REMICADE IV) Inject 1 Dose into the vein See admin instructions. Remicade Infusion every 2 months   Ivermectin 1 % CREA 1 application to affected area   lactase (LACTAID) 3000 units tablet 1 tablet   loratadine (CLARITIN) 10 MG tablet    metoprolol succinate (TOPROL-XL) 25 MG 24 hr tablet Take 1 tablet (25 mg total) by mouth daily.   minoxidil (ROGAINE) 2 % external solution Apply 1 application topically 2 (two) times daily.   Multiple Vitamin (MULTIVITAMIN) tablet Take 1 tablet by mouth daily.   neomycin-bacitracin-polymyxin (NEOSPORIN) ointment Apply 1 application topically as needed for wound care.   Past Medical History:  Diagnosis Date   A-fib [facility])    Allergy    seasonal   Anemia    Cataract    bil cateracts removed   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 10/16/2017   GERD (gastroesophageal reflux disease)    Hyperlipidemia    Irritable bowel syndrome with diarrhea 07/16/2022   Lactose intolerance 10/16/2017   Lymphocytic colitis w/ patchy collagen deposition 09/28/2016   Rheumatoid arthritis (HCC)    Status post dilation of esophageal narrowing 2014   Wears glasses    Past Surgical History:  Procedure Laterality Date   CARDIOVERSION N/A 05/11/2019   Procedure: CARDIOVERSION;  Surgeon: Jodelle Red, MD;  Location: [facility] ENDOSCOPY;  Service:  Cardiovascular;  Laterality: N/A;   CATARACT EXTRACTION  06/30/2016 -right eye   left eye in 08/15/16   COLONOSCOPY     DILATION AND CURETTAGE OF UTERUS  2009   ESOPHAGEAL DILATION  05/31/1997   REPAIR EXTENSOR TENDON Right 05/14/2013   Procedure: LONG FINGER EXTENSOR TO ELEVATE RING AND SMALL FINGERS ;  Surgeon: Wyn Forster., MD;  Location: [facility];  Service: Orthopedics;  Laterality: Right;   seborrheic keratosis  09/03/2012   inflamed, excised from forehead   TENDON TRANSFER Right 09/03/2013   Procedure: RIGHT HAND FLEXOR CARPI RADIALUS TRANSFER TO LONG/RING/SMALL;  Surgeon: Wyn Forster., MD;  Location: [facility];  Service: Orthopedics;  Laterality: Right;   TONSILLECTOMY  TOOTH EXTRACTION  05/2022   TUBAL LIGATION     ULNAR HEAD EXCISION Right 05/14/2013   Procedure: ARTHOPLASTY DISTAL RADIAL ULNAR JOINT, RIGHT ;  Surgeon: Wyn Forster., MD;  Location: [facility];  Service: Orthopedics;  Laterality: Right;   UPPER GASTROINTESTINAL ENDOSCOPY     Social History   Social History Narrative   Health Care POA: Husband, FredEmergency Contact: FredEnd of Life Plan: Who lives with you: husbandSon, [place] pets: 3 catsDiet: Pt has a varied diet of protein, starch, and vegetables.Exercise: Pt works with trainer 2x a week for 60 minutesSeatbelts: Pt reports wearing seatbelt when in vehicle.Sun Exposure/Protection: Pt reports not using sun protection.Hobbies: gardening, reading, concerts      Widowed 05/19/2022-moved to [facility] 05/2022   family history includes COPD in her father; Colon cancer in her paternal uncle; Heart disease in her mother; Osteoporosis in her maternal aunt, maternal grandmother, and mother.   Review of Systems As per HPI  Objective:   Physical Exam BP (!) 100/58   Pulse 83   Ht 5\' 4"  (1.626 m)   Wt 112 lb (50.8 kg)   SpO2 98%   BMI 19.22 kg/m  Thin Abd soft NT  31 minutes total time  before during and after patient interaction

## 2023-05-22 NOTE — Patient Instructions (Addendum)
Glad you are doing better. Per Dr Leone Payor continue your diphenoxylate-atropine as you are currently taking it- 3 every other day.  _______________________________________________________  If your blood pressure at your visit was 140/90 or greater, please contact your primary care physician to follow up on this.  _______________________________________________________  If you are age 85 or older, your body mass index should be between 23-30. Your Body mass index is 19.22 kg/m. If this is out of the aforementioned range listed, please consider follow up with your Primary Care Provider.  If you are age 88 or younger, your body mass index should be between 19-25. Your Body mass index is 19.22 kg/m. If this is out of the aformentioned range listed, please consider follow up with your Primary Care Provider.   ________________________________________________________  The [place] [facility] providers would like to encourage you to use [facility] to communicate with providers for non-urgent requests or questions.  Due to long hold times on the telephone, sending your provider a message by [facility] may be a faster and more efficient way to get a response.  Please allow 48 business hours for a response.  Please remember that this is for non-urgent requests.  _______________________________________________________  I appreciate the opportunity to care for you. Stan Head, MD, [facility]

## 2023-05-29 ENCOUNTER — Ambulatory Visit: Payer: Medicare PPO | Admitting: Podiatry

## 2023-05-29 ENCOUNTER — Encounter: Payer: Self-pay | Admitting: Podiatry

## 2023-05-29 VITALS — Ht 64.0 in | Wt 108.0 lb

## 2023-05-29 DIAGNOSIS — M79674 Pain in right toe(s): Secondary | ICD-10-CM | POA: Diagnosis not present

## 2023-05-29 DIAGNOSIS — B351 Tinea unguium: Secondary | ICD-10-CM

## 2023-05-29 DIAGNOSIS — M79675 Pain in left toe(s): Secondary | ICD-10-CM | POA: Diagnosis not present

## 2023-05-29 NOTE — Progress Notes (Signed)
  Subjective:  Patient ID: [name], female    DOB: [birth year],  MRN: [id number]  83 y.o. female presents to clinic with  painful mycotic toenails x 10 which interfere with daily activities. Pain is relieved with periodic professional debridement.  Chief Complaint  Patient presents with   RFC    She is here for a nail, " I have a couple of nails that may be hang nails, some nail fungus to check, and PCP is Dr. Lenord Fellers the end of the year" she has been using Tea tree therapy for her toe nails" she is on Eliquis.    New problem(s): None   PCP is Baxley, Luanna Cole, MD.  Allergies  Allergen Reactions   Cox-2 Inhibitors (Sulfonamide) Nausea And Vomiting    Severe abd pain   Leflunomide Other (See Comments)    Other reaction(s): hairloss   Methotrexate     Other reaction(s): hairloss   Rosuvastatin Diarrhea   Aspirin Nausea Only    Other reaction(s): GI upset   Glucose Diarrhea    cramps   Lactose Intolerance (Gi) Diarrhea    Stomach cramps   Oxycodone-Acetaminophen     Other reaction(s): Unknown   Sulfa Antibiotics Other (See Comments)    Unknown as child    Review of Systems: Negative except as noted in the HPI.   Objective:  [name] is a pleasant 83 y.o. female thin build in NAD. AAO x 3.  Vascular Examination: Vascular status intact b/l with palpable pedal pulses. CFT immediate b/l. No edema. No pain with calf compression b/l. Skin temperature gradient WNL b/l. Varicosities present b/l.  Neurological Examination: Sensation grossly intact b/l with 10 gram monofilament. Vibratory sensation intact b/l.   Dermatological Examination: Pedal skin with normal turgor, texture and tone b/l. Toenails 1-5 b/l thick, discolored, elongated with subungual debris and pain on dorsal palpation. No hyperkeratotic lesions noted b/l.   Musculoskeletal Examination: Muscle strength 5/5 to b/l LE. Hammertoe deformity noted 2-5 b/l. Patient ambulates independent of any assistive  aids.  Radiographs: None  Last A1c:       No data to display           Assessment:  No diagnosis found.  Plan:  Patient was evaluated and treated. All patient's and/or POA's questions/concerns addressed on today's visit. Toenails 1-5 debrided in length and girth without incident. Continue soft, supportive shoe gear daily. Report any pedal injuries to medical professional. Call office if there are any questions/concerns. -Patient/POA to call should there be question/concern in the interim.  Return in about 3 months (around 08/27/2023).  Freddie Breech, DPM      [place] LOCATION: [address] [place] [address]                   Office [phone]   [facility] LOCATION: [address] [place], [place] [address] Office [phone]

## 2023-05-31 ENCOUNTER — Other Ambulatory Visit: Payer: Self-pay | Admitting: Cardiology

## 2023-05-31 DIAGNOSIS — I4819 Other persistent atrial fibrillation: Secondary | ICD-10-CM

## 2023-05-31 NOTE — Telephone Encounter (Signed)
Eliquis 2.5mg  refill request received. Patient is 83 years old, weight-49kg, Crea-0.66 on 04/01/23, Diagnosis-Afib, and last seen by Dr. Bjorn Pippin on 02/27/23. Dose is appropriate based on dosing criteria. Will send in refill to requested pharmacy.

## 2023-06-17 ENCOUNTER — Other Ambulatory Visit (HOSPITAL_BASED_OUTPATIENT_CLINIC_OR_DEPARTMENT_OTHER): Payer: Self-pay

## 2023-06-18 ENCOUNTER — Encounter: Payer: Self-pay | Admitting: Internal Medicine

## 2023-07-09 ENCOUNTER — Telehealth: Payer: Self-pay | Admitting: Internal Medicine

## 2023-07-09 NOTE — Telephone Encounter (Signed)
 [name] has an appointment tomorrow with [place] at 1:30pm. No fever, extreme diarrhea off/on since Christmas. She got #90 Lomotil on March 2nd. She is took #14 yesterday, #11 on Sunday,  #13 Saturday. She has enough for today and is requesting at least #10 or #12 to cover her until her appointment tomorrow. She called the pharmacy and they told her she couldn't get a refill until Friday March 14th. As of almost 2pm today she has already taken #6 . She is trying to stay on her diet the best she can. Please advise she wants a call back.

## 2023-07-09 NOTE — Telephone Encounter (Signed)
 Inbound call from patient stating she has been having loose stools. Stated she is almost out of Lomotil medication. States she is taking more than she should but it has not been helping. States she had it refilled it on 3/2. Requesting for another refill and a call back. Please advise, thank you.

## 2023-07-09 NOTE — Telephone Encounter (Signed)
 No refill she is taking way too much which is a health risk will see her tomorrow

## 2023-07-09 NOTE — Telephone Encounter (Signed)
 [name] informed and said she understands Dr Marvell Fuller position. They will talk at the appointment tomorrow.

## 2023-07-10 ENCOUNTER — Other Ambulatory Visit (INDEPENDENT_AMBULATORY_CARE_PROVIDER_SITE_OTHER)

## 2023-07-10 ENCOUNTER — Encounter: Payer: Self-pay | Admitting: Internal Medicine

## 2023-07-10 ENCOUNTER — Ambulatory Visit: Admitting: Internal Medicine

## 2023-07-10 VITALS — BP 102/60 | HR 60 | Ht 64.0 in | Wt 110.0 lb

## 2023-07-10 DIAGNOSIS — K52832 Lymphocytic colitis: Secondary | ICD-10-CM

## 2023-07-10 DIAGNOSIS — K580 Irritable bowel syndrome with diarrhea: Secondary | ICD-10-CM

## 2023-07-10 DIAGNOSIS — K900 Celiac disease: Secondary | ICD-10-CM

## 2023-07-10 DIAGNOSIS — R197 Diarrhea, unspecified: Secondary | ICD-10-CM

## 2023-07-10 LAB — COMPREHENSIVE METABOLIC PANEL
ALT: 16 U/L (ref 0–35)
AST: 20 U/L (ref 0–37)
Albumin: 4 g/dL (ref 3.5–5.2)
Alkaline Phosphatase: 48 U/L (ref 39–117)
BUN: 11 mg/dL (ref 6–23)
CO2: 27 meq/L (ref 19–32)
Calcium: 9.4 mg/dL (ref 8.4–10.5)
Chloride: 101 meq/L (ref 96–112)
Creatinine, Ser: 0.77 mg/dL (ref 0.40–1.20)
GFR: 70.8 mL/min (ref >60.00)
Glucose, Bld: 97 mg/dL (ref 70–99)
Potassium: 4.1 meq/L (ref 3.5–5.1)
Sodium: 137 meq/L (ref 135–145)
Total Bilirubin: 0.5 mg/dL (ref 0.2–1.2)
Total Protein: 7.2 g/dL (ref 6.0–8.3)

## 2023-07-10 LAB — C-REACTIVE PROTEIN: CRP: <1 mg/dL (ref 0.5–20.0)

## 2023-07-10 LAB — CBC
HCT: 40 % (ref 36.0–46.0)
Hemoglobin: 13.7 g/dL (ref 12.0–15.0)
MCHC: 34.1 g/dL (ref 30.0–36.0)
MCV: 102.4 fl — ABNORMAL HIGH (ref 78.0–100.0)
Platelets: 200 10*3/uL (ref 150.0–400.0)
RBC: 3.91 Mil/uL (ref 3.87–5.11)
RDW: 13.3 % (ref 11.5–15.5)
WBC: 4.6 10*3/uL (ref 4.0–10.5)

## 2023-07-10 LAB — SEDIMENTATION RATE: Sed Rate: 29 mm/h (ref 0–30)

## 2023-07-10 MED ORDER — BUDESONIDE 3 MG PO CPEP: 9.0000 mg | ORAL_CAPSULE | ORAL | 1 refills | Status: DC

## 2023-07-10 MED ORDER — DIPHENOXYLATE-ATROPINE 2.5-0.025 MG PO TABS: 2.0000 | ORAL_TABLET | Freq: Four times a day (QID) | ORAL | 1 refills | Status: DC | PRN

## 2023-07-10 NOTE — Progress Notes (Signed)
 [name] 83 y.o. 05-Jul-1940 [id number]  Assessment & Plan:   Encounter Diagnoses  Name Primary?   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 Await workup as below.  Labs have returned all normal.  Await stool studies.  We discussed the appropriate use of Lomotil and the maximum amount is 8/day.  Depending upon how she does may need to consider alosetron.  Cholestyramine could be another option.  Orders Placed This Encounter  Procedures   Stool Culture   Stool Giardia/Cryptosporidium   Clostridium Difficile by PCR([facility] only )   Calprotectin, Fecal   Comp Met (CMET)   CBC   C-reactive protein   Sed Rate (ESR)   Meds ordered this encounter  Medications   budesonide (ENTOCORT EC) 3 MG 24 hr capsule    Sig: Take 3 capsules (9 mg total) by mouth every morning.    Dispense:  90 capsule    Refill:  1   diphenoxylate-atropine (LOMOTIL) 2.5-0.025 MG tablet    Sig: Take 2 tablets by mouth 4 (four) times daily as needed for diarrhea or loose stools. TAKE 2 TABLETS BY MOUTH 4 TIMES DAILY AS NEEDED FOR DIARRHEA OR LOOSE STOOLS MAXIMUM 8 TABLETS DAILY    Dispense:  90 tablet    Refill:  1    This request is for a new prescription for a controlled substance as required by Federal/State law..      Subjective:   Chief Complaint: diarrhea  HPI [name] is an 83 year old white woman who has a history of IBS-D, prior lymphocytic colitis with patchy collagen deposition treated with budesonide, suspected celiac disease and lactose intolerance.  She also has a history of rheumatoid arthritis and takes Remicade.  She is having worsening diarrhea problems is taking more diphenoxylate/atropine than prescribed and ran out early.  She called the office yesterday  asking for a refill.  She is taking more than 10 a day.  She is reporting frequent defecation up to 10 times a day, and typically right after she eats multiple times within 15 minutes.  If she limits her intake of food it is less of a problem.  She has been taking more than 10 Lomotil a day and it does not sound like it is made a whole lot of difference.  She is also had nocturnal diarrhea that has surprised her and caused incontinence.  No recent antibiotics.  She denies any abdominal pain rectal bleeding or fever.  She had been having more problems late last year and then when she was seen in January she was improving.  Wt Readings from Last 3 Encounters:  07/10/23 110 lb (49.9 kg)  05/29/23 108 lb (49 kg)  05/22/23 112 lb (50.8 kg)  December 2024 117 pounds October 2024 115 pounds   Last colonoscopy 09/20/2016-diverticulosis, normal mucosa but biopsies revealed lymphocytic colitis with patchy collagen deposition she was treated successfully with budesonide after this though she has had persistent IBS problems since major improvement with budesonide.    Allergies  Allergen Reactions   Cox-2 Inhibitors (Sulfonamide) Nausea And Vomiting    Severe abd pain   Leflunomide Other (See Comments)    Other reaction(s): hairloss   Methotrexate     Other reaction(s): hairloss   Rosuvastatin Diarrhea  Aspirin Nausea Only    Other reaction(s): GI upset   Glucose Diarrhea    cramps   Lactose Intolerance (Gi) Diarrhea    Stomach cramps   Oxycodone-Acetaminophen     Other reaction(s): Unknown   Sulfa Antibiotics Other (See Comments)    Unknown as child   Current Meds  Medication Sig   acetaminophen (TYLENOL) 500 MG tablet Take 500-1,000 mg by mouth every 8 (eight) hours as needed for moderate pain or headache.    atorvastatin (LIPITOR) 20 MG tablet Take 1 tablet (20 mg total) by mouth 3 (three) times a week.       calcium carbonate (TUMS EX) 750 MG chewable tablet Chew 1,500 mg by mouth as  needed.   Clobetasol Propionate 0.05 % shampoo Apply topically 3 (three) times a week.   diphenhydrAMINE (BENADRYL) 25 MG tablet Take 25 mg by mouth as needed.   ELIQUIS 2.5 MG TABS tablet TAKE 1 TABLET BY MOUTH TWICE A DAY   esomeprazole (NEXIUM) 40 MG capsule TAKE 1 CAPSULE BY MOUTH EVERY DAY BEFORE BREAKFAST   hydrocortisone cream 1 % Apply 1 Application topically daily as needed for itching (burns).   InFLIXimab (REMICADE IV) Inject 1 Dose into the vein See admin instructions. Remicade Infusion every 2 months   Ivermectin 1 % CREA 1 application to affected area   lactase (LACTAID) 3000 units tablet 1 tablet   loratadine (CLARITIN) 10 MG tablet    metoprolol succinate (TOPROL-XL) 25 MG 24 hr tablet Take 1 tablet (25 mg total) by mouth daily.   minoxidil (ROGAINE) 2 % external solution Apply 1 application topically 2 (two) times daily.   Multiple Vitamin (MULTIVITAMIN) tablet Take 1 tablet by mouth daily.   neomycin-bacitracin-polymyxin (NEOSPORIN) ointment Apply 1 application topically as needed for wound care.   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 10/16/2017   GERD (gastroesophageal reflux disease)    Hyperlipidemia    Irritable bowel syndrome with diarrhea 07/16/2022   Lactose intolerance 10/16/2017   Lymphocytic colitis w/ patchy collagen deposition 09/28/2016   Rheumatoid arthritis (HCC)    Status post dilation of esophageal narrowing 2014   Wears glasses    Past Surgical History:  Procedure Laterality Date   CARDIOVERSION N/A 05/11/2019   Procedure: CARDIOVERSION;  Surgeon: Jodelle Red, MD;  Location: [facility] ENDOSCOPY;  Service:  Cardiovascular;  Laterality: N/A;   CATARACT EXTRACTION  06/30/2016 -right eye   left eye in 08/15/16   COLONOSCOPY     DILATION AND CURETTAGE OF UTERUS  2009   ESOPHAGEAL DILATION  05/31/1997   REPAIR EXTENSOR TENDON Right 05/14/2013   Procedure: LONG FINGER EXTENSOR TO ELEVATE RING AND SMALL FINGERS ;  Surgeon: Wyn Forster., MD;  Location: [facility];  Service: Orthopedics;  Laterality: Right;   seborrheic keratosis  09/03/2012   inflamed, excised from forehead   TENDON TRANSFER Right 09/03/2013   Procedure: RIGHT HAND FLEXOR CARPI RADIALUS TRANSFER TO LONG/RING/SMALL;  Surgeon: Wyn Forster., MD;  Location: [facility];  Service: Orthopedics;  Laterality: Right;   TONSILLECTOMY     TOOTH EXTRACTION  05/2022   TUBAL LIGATION     ULNAR HEAD EXCISION Right 05/14/2013   Procedure: ARTHOPLASTY DISTAL RADIAL ULNAR JOINT, RIGHT ;  Surgeon: Wyn Forster., MD;  Location: [facility];  Service: Orthopedics;  Laterality: Right;   UPPER GASTROINTESTINAL ENDOSCOPY     Social History   Social History Narrative   Health Care POA: Husband, FredEmergency Contact: FredEnd of Life Plan: Who lives with you: husbandSon, in [place] pets: 3 catsDiet: Pt has a varied diet of protein, starch, and vegetables.Exercise: Pt works with trainer 2x a week for 60 minutesSeatbelts: Pt reports wearing seatbelt when in vehicle.Sun Exposure/Protection: Pt reports not using sun protection.Hobbies: gardening, reading, concerts      Widowed 05/19/2022-moved to [facility] 05/2022   family history includes COPD in her father; Colon cancer in her paternal uncle; Heart disease in her mother; Osteoporosis in her maternal aunt, maternal grandmother, and mother.   Review of Systems   Objective:   Physical Exam @BP  102/60   Pulse 60   Ht 5\' 4"  (1.626 m)   Wt 110 lb (49.9 kg)   BMI 18.88 kg/m @  General:  NAD Eyes:   anicteric Lungs:  clear Heart::  S1S2  no rubs, murmurs or gallops Abdomen:  soft and nontender, BS+ Ext:   no edema, cyanosis or clubbing  Patti [place], CMA present. Rectal  soft brown stool heme negative no impaction   Data Reviewed:

## 2023-07-10 NOTE — Patient Instructions (Addendum)
 Your provider has requested that you go to the basement level for lab work before leaving today. Press "B" on the elevator. The lab is located at the first door on the left as you exit the elevator.  Due to recent changes in healthcare laws, you may see the results of your imaging and laboratory studies on MyChart before your provider has had a chance to review them.  We understand that in some cases there may be results that are confusing or concerning to you. Not all laboratory results come back in the same time frame and the provider may be waiting for multiple results in order to interpret others.  Please give [place] 48 hours in order for your provider to thoroughly review all the results before contacting the office for clarification of your results.   We have sent the  medications to your pharmacy for you to pick up at your convenience.  FORCE FLUIDS AND FOLLOW A BRAT (Bananas, Rice, Applesauce, Toast) DIET THEN PROGRESS TO A LOW FIBER DIET. Handout provided.   I appreciate the opportunity to care for you. Stan Head, MD, [facility]

## 2023-07-11 ENCOUNTER — Other Ambulatory Visit

## 2023-07-11 DIAGNOSIS — R197 Diarrhea, unspecified: Secondary | ICD-10-CM | POA: Diagnosis not present

## 2023-07-11 DIAGNOSIS — M0609 Rheumatoid arthritis without rheumatoid factor, multiple sites: Secondary | ICD-10-CM | POA: Diagnosis not present

## 2023-07-12 ENCOUNTER — Telehealth: Payer: Self-pay | Admitting: Internal Medicine

## 2023-07-12 LAB — GIARDIA AND CRYPTOSPORIDIUM ANTIGEN PANEL
MICRO NUMBER:: 16197333
RESULT:: NOT DETECTED
SPECIMEN QUALITY:: ADEQUATE
Specimen Quality:: ADEQUATE
micro Number:: 16197332

## 2023-07-12 NOTE — Telephone Encounter (Signed)
 [name] is feeling much better she said. She is forcing fluids and I told her to she can test out the low fiber diet and if that doesn't work out for her to go back on the [employer]. I told her we will be in touch with her once the stool studies come back.

## 2023-07-12 NOTE — Telephone Encounter (Signed)
 Inbound call from patient, would like to know when she can get off the BRAT diet and start the low fiber diet that was discussed during Dr. Marvell Fuller last appointment.

## 2023-07-13 LAB — CLOSTRIDIUM DIFFICILE BY PCR: Toxigenic C. Difficile by PCR: NEGATIVE

## 2023-07-14 LAB — CALPROTECTIN, FECAL: Calprotectin, Fecal: 873 ug/g — ABNORMAL HIGH (ref 0–120)

## 2023-07-16 LAB — SALMONELLA/SHIGELLA CULT, CAMPY EIA AND SHIGA TOXIN RFL ECOLI
MICRO NUMBER: 16197323
MICRO NUMBER:: 16197324
MICRO NUMBER:: 16197325
Result:: NOT DETECTED
SHIGA RESULT:: NOT DETECTED
SPECIMEN QUALITY: ADEQUATE
SPECIMEN QUALITY:: ADEQUATE
SPECIMEN QUALITY:: ADEQUATE

## 2023-07-16 LAB — TIQ-NTM

## 2023-07-17 NOTE — Telephone Encounter (Signed)
 Dr Leone Payor  I wanted to update you on [name]. I looked and her stool culture hasn't resulted yet. I called the lab, [facility] and [facility]. No one can find the test results. The lab has where it was sent to [facility] but they do not have it. If you need this it will have to be reordered and recollected. Please advise, thank you.

## 2023-07-17 NOTE — Telephone Encounter (Signed)
 [name] is down to 2 of the Lomotil daily and still forcing fluids, on the BRAT diet but I told her as she feels better to advance that to low fiber. She said she has lost some weight at home she weighs 105lb. I told her to try and gain some back.

## 2023-07-17 NOTE — Telephone Encounter (Signed)
 What I see is a Salmonella and Shigella test which is negative.  All of the tests we did did not reveal an infection.  The traditional stool culture was not done for some reason and I think they substituted to Salmonella and Shigella perhaps.  If she is improving I do not think we need to do anything further with the tests.  Lets give her a follow-up in April  using one of the nurse visits that are open

## 2023-07-17 NOTE — Telephone Encounter (Signed)
 Inbound call from patient, states she has a question about the [employer], patient did not wish to disclose any further information.

## 2023-07-18 ENCOUNTER — Ambulatory Visit: Payer: Medicare PPO | Admitting: Internal Medicine

## 2023-07-18 NOTE — Telephone Encounter (Signed)
 I spoke with [name] and put her on the books to see [place] 08/22/2023 at 1:30pm.

## 2023-07-23 ENCOUNTER — Ambulatory Visit: Payer: Medicare PPO | Admitting: Internal Medicine

## 2023-08-01 ENCOUNTER — Other Ambulatory Visit: Payer: Self-pay | Admitting: Internal Medicine

## 2023-08-01 DIAGNOSIS — K52832 Lymphocytic colitis: Secondary | ICD-10-CM

## 2023-08-02 ENCOUNTER — Encounter: Payer: Self-pay | Admitting: Internal Medicine

## 2023-08-02 ENCOUNTER — Ambulatory Visit: Admitting: Internal Medicine

## 2023-08-02 VITALS — BP 116/78 | HR 57 | Temp 97.8°F | Resp 12 | Ht 64.0 in | Wt 105.0 lb

## 2023-08-02 DIAGNOSIS — J069 Acute upper respiratory infection, unspecified: Secondary | ICD-10-CM

## 2023-08-02 MED ORDER — AZITHROMYCIN 250 MG PO TABS
ORAL_TABLET | ORAL | 0 refills | Status: AC
Start: 2023-08-02 — End: 2023-08-07

## 2023-08-02 NOTE — Patient Instructions (Signed)
 It was a pleasure to see you today.  I have prescribed Zithromax Z-PAK to take 2 tabs day 1 and 1 tab days 2 through 5 for an acute upper respiratory infection/sinusitis.  Please rest and stay well-hydrated.  Call if not better in 7 days or sooner if worse.

## 2023-08-02 NOTE — Progress Notes (Signed)
 Patient Care Team: Sylvan Evener, MD as PCP - General (Internal Medicine) Wendie Hamburg, MD as PCP - Cardiology (Cardiology) Glorine Laroche, MD (Family Medicine) Delilah Fend, MD (Inactive) (Gastroenterology) Oris Birmingham, MD (Ophthalmology) Renea Carrion, MD (Obstetrics and Gynecology) Daryl Enter, MD (Dermatology) Manson Seitz (Dental General Practice)  Visit Date: 08/02/23  Subjective:   Chief Complaint  Patient presents with   Cough    Thick green mucous   Patient ZO:XWRUE N Sandell,Female DOB:12-Dec-1940,83 y.o. AVW:[id number]   83 y.o. Female presents today for acute sick visit with Cough w/ Expectoration; Sinus Congestion of Green Sputum. Patient has a past medical history of Seasonal Allergies; Suspected Celiac Disease; GERD; IBS-D; Lymphocytic Colitis. Denies fever. She says that her symptoms started last Tuesday with a sore throat and hoarseness. Her son had come down last week, so she has been out more than she usually would according to her. Says that she has been using OTC Diphenhydramine and has some OTC cough medicine that she hasn't needed to use.  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 10/16/2017   GERD (gastroesophageal reflux disease)    Hyperlipidemia    Irritable bowel syndrome with diarrhea 07/16/2022   Lactose intolerance 10/16/2017   Lymphocytic colitis w/ patchy collagen deposition 09/28/2016   Rheumatoid arthritis (HCC)    Status post dilation of esophageal narrowing 2014   Wears glasses     Allergies  Allergen Reactions   Cox-2 Inhibitors (Sulfonamide) Nausea And Vomiting    Severe abd pain   Leflunomide Other (See Comments)    Other reaction(s): hairloss   Methotrexate     Other reaction(s): hairloss   Rosuvastatin Diarrhea   Aspirin Nausea Only    Other reaction(s): GI upset   Glucose Diarrhea    cramps    Lactose Intolerance (Gi) Diarrhea    Stomach cramps   Oxycodone-Acetaminophen     Other reaction(s): Unknown   Sulfa Antibiotics Other (See Comments)    Unknown as child    Family History  Problem Relation Age of Onset   Heart disease Mother    Osteoporosis Mother    COPD Father    Osteoporosis Maternal Grandmother    Colon cancer Paternal Uncle    Osteoporosis Maternal Aunt    Esophageal cancer Neg Hx    Pancreatic cancer Neg Hx    Rectal cancer Neg Hx    Stomach cancer Neg Hx    Social History   Social History Narrative   Health Care POA: Husband, FredEmergency Contact: FredEnd of Life Plan: Who lives with you: husbandSon, in [place] pets: 3 catsDiet: Pt has a varied diet of protein, starch, and vegetables.Exercise: Pt works with trainer 2x a week for 60 minutesSeatbelts: Pt reports wearing seatbelt when in vehicle.Sun Exposure/Protection: Pt reports not using sun protection.Hobbies: gardening, reading, concerts      Widowed 05/19/2022-moved to [facility] 05/2022   Review of Systems  Constitutional:  Negative for fever.  HENT:  Positive for congestion (nasal/sinus).   Respiratory:  Positive for cough and sputum production (green).      Objective:  Vitals: BP 116/78 (BP Location: Right Arm, Patient Position: Sitting, Cuff Size: Normal)   Pulse (!) 57   Temp 97.8 F (36.6 C) (Tympanic)   Resp 12   Ht 5\' 4"  (1.626 m)   Wt 105 lb (47.6  kg)   SpO2 98%   BMI 18.02 kg/m   Physical Exam Vitals and nursing note reviewed.  Constitutional:      General: She is not in acute distress.    Appearance: Normal appearance. She is not ill-appearing.  HENT:     Head: Normocephalic and atraumatic.     Right Ear: Hearing, tympanic membrane, ear canal and external ear normal.     Left Ear: Hearing, tympanic membrane, ear canal and external ear normal.     Mouth/Throat:     Mouth: Mucous membranes are moist.     Pharynx: Oropharynx is clear. No oropharyngeal exudate or posterior  oropharyngeal erythema.  Pulmonary:     Effort: Pulmonary effort is normal.     Breath sounds: Normal breath sounds. No wheezing, rhonchi or rales.  Lymphadenopathy:     Cervical: No cervical adenopathy.  Skin:    General: Skin is warm and dry.  Neurological:     Mental Status: She is alert and oriented to person, place, and time. Mental status is at baseline.  Psychiatric:        Mood and Affect: Mood normal.        Behavior: Behavior normal.        Thought Content: Thought content normal.        Judgment: Judgment normal.     Results:  Studies Obtained And Personally Reviewed By Me: Labs:     Component Value Date/Time   NA 137 07/10/2023 1422   NA 141 05/08/2019 0849   K 4.1 07/10/2023 1422   CL 101 07/10/2023 1422   CO2 27 07/10/2023 1422   GLUCOSE 97 07/10/2023 1422   BUN 11 07/10/2023 1422   BUN 15 05/08/2019 0849   CREATININE 0.77 07/10/2023 1422   CREATININE 0.66 04/01/2023 0935   CALCIUM 9.4 07/10/2023 1422   PROT 7.2 07/10/2023 1422   ALBUMIN 4.0 07/10/2023 1422   AST 20 07/10/2023 1422   ALT 16 07/10/2023 1422   ALKPHOS 48 07/10/2023 1422   BILITOT 0.5 07/10/2023 1422   GFRNONAA 79 03/15/2020 1107   GFRAA 91 03/15/2020 1107    Lab Results  Component Value Date   WBC 4.6 07/10/2023   HGB 13.7 07/10/2023   HCT 40.0 07/10/2023   MCV 102.4 (H) 07/10/2023   PLT 200.0 07/10/2023   Lab Results  Component Value Date   CHOL 234 (H) 04/01/2023   HDL 132 04/01/2023   LDLCALC 86 04/01/2023   LDLDIRECT 149 (H) 11/11/2007   TRIG 73 04/01/2023   CHOLHDL 1.8 04/01/2023   Lab Results  Component Value Date   TSH 2.18 04/01/2023   Assessment & Plan:  Acute Upper Respiratory Infection: Sending in 250 mg Azithromycin - take 2 tablets on Day 1 and 1 tablet on Days 2-5. She has some OTC cough suppressant. Contact us  after 7 days if symptoms worsen/persist despite treatment.      I,Emily Lagle,acting as a Neurosurgeon for Sylvan Evener, MD.,have documented all  relevant documentation on the behalf of Sylvan Evener, MD,as directed by  Sylvan Evener, MD while in the presence of Sylvan Evener, MD.   I, Sylvan Evener, MD, have reviewed all documentation for this visit. The documentation on 08/12/23 for the exam, diagnosis, procedures, and orders are all accurate and complete.

## 2023-08-05 ENCOUNTER — Telehealth: Payer: Self-pay | Admitting: Internal Medicine

## 2023-08-05 ENCOUNTER — Telehealth: Payer: Self-pay

## 2023-08-05 NOTE — Telephone Encounter (Signed)
 He put a refill on it at her last visit so she will refill it to cover her until her next appointment 08/22/2023.

## 2023-08-05 NOTE — Telephone Encounter (Signed)
 Patient notified of note below.  She stated that she was feeling better since call was made.  But if she gets sick again she will call back and come in.

## 2023-08-05 NOTE — Telephone Encounter (Signed)
  Reason for CRM: Patient will finish zpack tomorrow.. does she need refill?  And is it okay for her to leave the house to pick up a few things?  [phone] pls call her

## 2023-08-05 NOTE — Telephone Encounter (Signed)
 Patient called would like to know if Dr. Leone Payor would like for her to continue with Budesonide, if so she needs a refill or  should she just wait till her upcoming appt. Please advise.

## 2023-08-12 ENCOUNTER — Encounter: Payer: Self-pay | Admitting: Internal Medicine

## 2023-08-19 ENCOUNTER — Telehealth: Payer: Self-pay | Admitting: Internal Medicine

## 2023-08-19 ENCOUNTER — Ambulatory Visit: Payer: Self-pay

## 2023-08-19 DIAGNOSIS — I7300 Raynaud's syndrome without gangrene: Secondary | ICD-10-CM | POA: Diagnosis not present

## 2023-08-19 DIAGNOSIS — M0609 Rheumatoid arthritis without rheumatoid factor, multiple sites: Secondary | ICD-10-CM | POA: Diagnosis not present

## 2023-08-19 DIAGNOSIS — M1991 Primary osteoarthritis, unspecified site: Secondary | ICD-10-CM | POA: Diagnosis not present

## 2023-08-19 DIAGNOSIS — Z681 Body mass index (BMI) 19 or less, adult: Secondary | ICD-10-CM | POA: Diagnosis not present

## 2023-08-19 DIAGNOSIS — K52832 Lymphocytic colitis: Secondary | ICD-10-CM | POA: Diagnosis not present

## 2023-08-19 DIAGNOSIS — M25572 Pain in left ankle and joints of left foot: Secondary | ICD-10-CM | POA: Diagnosis not present

## 2023-08-19 NOTE — Telephone Encounter (Signed)
 Copied from CRM [id number]. Topic: Clinical - Medication Refill >> Aug 19, 2023  9:32 AM Danae Duncans wrote: Most Recent Primary Care Visit:  Provider: Sylvan Evener  Department: [facility]  Visit Type: ACUTE  Date: 08/02/2023  Medication: azithromycin  (ZITHROMAX ) 250 MG tablet  Has the patient contacted their pharmacy? Yes (Agent: If no, request that the patient contact the pharmacy for the refill. If patient does not wish to contact the pharmacy document the reason why and proceed with request.) (Agent: If yes, when and what did the pharmacy advise?) there is nomore refill  Is this the correct pharmacy for this prescription? Yes If no, delete pharmacy and type the correct one.  This is the patient's preferred pharmacy:  [id number] [place], [place] - [address] [place] [address] Phone: [phone] Fax: [phone]   Has the prescription been filled recently? Yes  Is the patient out of the medication? Yes  Has the patient been seen for an appointment in the last year OR does the patient have an upcoming appointment? Yes  Can we respond through MyChart? Yes  Agent: Please be advised that Rx refills may take up to 3 business days. We ask that you follow-up with your pharmacy.

## 2023-08-19 NOTE — Telephone Encounter (Signed)
  Chief Complaint: post nasal drip Symptoms: clear to white mucus post nasal drip Frequency: x 2 week Pertinent Negatives: Patient denies fever, SOB, chest pain, cough, sore throat, voice hoarseness, sneezing, runny nose. Disposition: [] ED /[] Urgent Care (no appt availability in office) / [] Appointment(In office/virtual)/ []  [facility]/ [] Home Care/ [] Refused Recommended Disposition /[] [facility]/ [x]  Follow-up with PCP Additional Notes: Patient given azithromycin  on 08/02/23. Today patient put in medication refill request. Called and triaged patient as this was a one time prescription. She states the cough has improved but she still has a clear to white mucus that she is able to cough up (she states she is not coughing but is able to cough or hack out the mucus). She states it is a constant drip in the back of her throat. She states she noticed that Benadryl has helped. She was wondering what she should take to dry it up. She states the Z pack helped tremendously.  Reason for Disposition  [1] Caller has NON-URGENT medicine question about med that PCP prescribed AND [2] triager unable to answer question  Answer Assessment - Initial Assessment Questions 1. NAME of MEDICINE: "What medicine(s) are you calling about?"     Azithromycin   2. QUESTION: "What is your question?" (e.g., double dose of medicine, side effect)     Patient was asking if there is something she should be taking beside Bendaryl to dry up her secretions. She states she was placed on Azithromycin  and that helped tremendously.  3. PRESCRIBER: "Who prescribed the medicine?" Reason: if prescribed by specialist, call should be referred to that group.     Dr Liane Redman.  4. SYMPTOMS: "Do you have any symptoms?" If Yes, ask: "What symptoms are you having?"  "How bad are the symptoms (e.g., mild, moderate, severe)     Post nasal drip with clear mucus.  5. PREGNANCY:  "Is there any chance that you are pregnant?" "When  was your last menstrual period?"     N/A.  Protocols used: Medication Question Call-A-AH

## 2023-08-19 NOTE — Telephone Encounter (Signed)
 Closing encounter. Please see nurse triage dated today.

## 2023-08-20 ENCOUNTER — Other Ambulatory Visit: Payer: Self-pay | Admitting: Family

## 2023-08-20 MED ORDER — METHYLPREDNISOLONE 4 MG PO TBPK: ORAL_TABLET | ORAL | 0 refills | Status: DC

## 2023-08-22 ENCOUNTER — Ambulatory Visit: Admitting: Internal Medicine

## 2023-08-22 ENCOUNTER — Encounter: Payer: Self-pay | Admitting: Internal Medicine

## 2023-08-22 VITALS — BP 118/62 | HR 68 | Ht 64.0 in | Wt 113.0 lb

## 2023-08-22 DIAGNOSIS — K52832 Lymphocytic colitis: Secondary | ICD-10-CM

## 2023-08-22 DIAGNOSIS — K900 Celiac disease: Secondary | ICD-10-CM

## 2023-08-22 DIAGNOSIS — K580 Irritable bowel syndrome with diarrhea: Secondary | ICD-10-CM | POA: Diagnosis not present

## 2023-08-22 NOTE — Progress Notes (Signed)
 [name] 82 y.o. February 25, 1941 [id number]  Assessment & Plan:   Encounter Diagnoses  Name Primary?   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 - Monitor for diarrhea recurrence, contact provider if symptoms return.  If this recurs will need to go on budesonide  again and we will work out a chronic therapy plan. - Reintroduce salads, monitor bowel habits. - Schedule follow-up in July.  CC: Veronica Evener, MD Dr. Kirtland James Subjective:   Chief Complaint: Lymphocytic colitis, IBS, possible celiac follow-up  HPI [name] is an 83 year old white woman who has a history of IBS-D, prior lymphocytic colitis with patchy collagen deposition (diagnosed at 2018 colonoscopy) treated with budesonide , suspected celiac disease and lactose intolerance.  She also has a history of rheumatoid arthritis and takes Remicade .  She was last seen July 10, 2023 and at that point was having terrible diarrhea problems and overusing Lomotil .  Workup was negative for Giardia cryptosporidium and C. difficile and bacterial infection.  A fecal calprotectin was 873.  Budesonide  9 mg daily was initiated and she has had a nice response having formed stools, and has been able to moved to a low fiber diet.  She still forcing a lot of liquids and indicates she is having some nocturia because of that.  Remains gluten-free as she always has.  She is not taking any generic Lomotil  at this time.  She feels much better.  She is eating a lot of ice cream trying to regain some weight.  Wt Readings from Last 3 Encounters:  08/22/23 113 lb (51.3 kg)  08/02/23 105 lb (47.6 kg)  07/10/23 110 lb (49.9 kg)    Allergies  Allergen Reactions   Cox-2 Inhibitors (Sulfonamide) Nausea And Vomiting    Severe abd pain   Leflunomide Other (See Comments)    Other reaction(s): hairloss    Methotrexate     Other reaction(s): hairloss   Rosuvastatin  Diarrhea   Aspirin Nausea Only    Other reaction(s): GI upset   Glucose Diarrhea    cramps   Lactose Intolerance (Gi) Diarrhea    Stomach cramps   Oxycodone -Acetaminophen      Other reaction(s): Unknown   Sulfa Antibiotics Other (See Comments)    Unknown as child   Current Meds  Medication Sig   acetaminophen  (TYLENOL ) 500 MG tablet Take 500-1,000 mg by mouth every 8 (eight) hours as needed for moderate pain or headache.    atorvastatin  (LIPITOR) 20 MG tablet Take 1 tablet (20 mg total) by mouth 3 (three) times a week.   budesonide  (ENTOCORT EC ) 3 MG 24 hr capsule Take 3 capsules (9 mg total) by mouth every morning.   calcium  carbonate (TUMS EX) 750 MG chewable tablet Chew 1,500 mg by mouth as needed.   Clobetasol  Propionate 0.05 % shampoo Apply topically 3 (three) times a week.   diphenhydrAMINE (BENADRYL) 25 MG tablet Take 25 mg by mouth as needed.   diphenoxylate -atropine  (LOMOTIL ) 2.5-0.025 MG tablet Take 2 tablets by mouth 4 (four) times daily as needed for diarrhea or loose stools. TAKE 2 TABLETS BY MOUTH 4 TIMES DAILY AS NEEDED FOR DIARRHEA OR LOOSE STOOLS MAXIMUM 8 TABLETS DAILY   ELIQUIS  2.5 MG TABS tablet TAKE 1 TABLET BY MOUTH TWICE A DAY   esomeprazole  (NEXIUM ) 40 MG capsule TAKE 1 CAPSULE BY MOUTH EVERY DAY BEFORE BREAKFAST   hydrocortisone   cream 1 % Apply 1 Application topically daily as needed for itching (burns).   inFLIXimab  (REMICADE ) 100 MG injection Inject 10 mg/kg into the vein every 8 (eight) weeks.   Ivermectin 1 % CREA 1 application to affected area   lactase (LACTAID) 3000 units tablet 1 tablet   loratadine (CLARITIN) 10 MG tablet    metoprolol  succinate (TOPROL -XL) 25 MG 24 hr tablet Take 1 tablet (25 mg total) by mouth daily.   minoxidil (ROGAINE) 2 % external solution Apply 1 application topically 2 (two) times daily.   Multiple Vitamin (MULTIVITAMIN) tablet Take 1 tablet by mouth daily.    neomycin-bacitracin-polymyxin (NEOSPORIN) ointment Apply 1 application topically as needed for wound care.   [DISCONTINUED] InFLIXimab  (REMICADE  IV) Inject 1 Dose into the vein See admin instructions. Remicade  Infusion every 2 months   Past Medical History:  Diagnosis Date   A-fib (HCC)    Allergy    seasonal   Anemia    Cataract    bil cateracts removed   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 10/16/2017   GERD (gastroesophageal reflux disease)    Hyperlipidemia    Irritable bowel syndrome with diarrhea 07/16/2022   Lactose intolerance 10/16/2017   Lymphocytic colitis w/ patchy collagen deposition 09/28/2016   Rheumatoid arthritis (HCC)    Status post dilation of esophageal narrowing 2014   Wears glasses    Past Surgical History:  Procedure Laterality Date   CARDIOVERSION N/A 05/11/2019   Procedure: CARDIOVERSION;  Surgeon: Veronica Donning, MD;  Location: [facility] ENDOSCOPY;  Service: Cardiovascular;  Laterality: N/A;   CATARACT EXTRACTION  06/30/2016 -right eye   left eye in 08/15/16   COLONOSCOPY     DILATION AND CURETTAGE OF UTERUS  2009   ESOPHAGEAL DILATION  05/31/1997   REPAIR EXTENSOR TENDON Right 05/14/2013   Procedure: LONG FINGER EXTENSOR TO ELEVATE RING AND SMALL FINGERS ;  Surgeon: [name]., MD;  Location: [facility];  Service: Orthopedics;  Laterality: Right;   seborrheic keratosis  09/03/2012   inflamed, excised from forehead   TENDON TRANSFER Right 09/03/2013   Procedure: RIGHT HAND FLEXOR CARPI RADIALUS TRANSFER TO LONG/RING/SMALL;  Surgeon: [name]., MD;  Location: [facility];  Service: Orthopedics;  Laterality: Right;   TONSILLECTOMY     TOOTH EXTRACTION  05/2022   TUBAL LIGATION     ULNAR HEAD EXCISION Right 05/14/2013   Procedure: ARTHOPLASTY DISTAL RADIAL ULNAR JOINT, RIGHT ;  Surgeon: [name]., MD;  Location: [facility];  Service: Orthopedics;  Laterality: Right;   UPPER GASTROINTESTINAL  ENDOSCOPY     Social History   Social History Narrative   Son in [place]   Widowed 05/19/2022-moved to [facility] 05/2022   family history includes COPD in her father; Colon cancer in her paternal uncle; Heart disease in her mother; Osteoporosis in her maternal aunt, maternal grandmother, and mother.   Review of Systems As per HPI  Objective:   Physical Exam BP 118/62   Pulse 68   Ht 5\' 4"  (1.626 m)   Wt 113 lb (51.3 kg)   BMI 19.40 kg/m

## 2023-08-22 NOTE — Patient Instructions (Signed)
 Finish up the Budesonide  and call us  if you get diarrhea again.   You may advance your diet to salads.  Cut back on your water intake at night if it's causing you to get up and pee in the night.    I appreciate the opportunity to care for you. Loy Ruff, MD, [facility]

## 2023-08-25 NOTE — Progress Notes (Signed)
 Cardiology Office Note:    Date:  08/29/2023   ID:  [name], DOB 07-Nov-1940, MRN [id number]  PCP:  Sylvan Evener, MD  Cardiologist:  Wendie Hamburg, MD  Electrophysiologist:  None   Referring MD: Sylvan Evener, MD   Chief Complaint  Patient presents with   Atrial Fibrillation    History of Present Illness:    [name] is a 83 y.o. female with a hx of  hyperlipidemia, rheumatoid arthritis, lymphocytic colitis, GERD who presents for follow-up.  She was initially seen on 04/03/2019, had been referred by Dr. Liane Redman for evaluation of new onset atrial fibrillation.  At initial clinic visit, she was noted to be in AF with rates 110s, was started on metoprolol  25 mg twice daily for rate control.  She was started on Eliquis  2.5 mg twice daily for anticoagulation.  TTE on 04/30/2019 showed EF 60 to 65%, normal RV function, severe left atrial dilatation, mild right atrial dilatation, mild MR, mild AI.  On 05/11/2019, she underwent a successful DCCV to restore sinus rhythm.  Following cardioversion, she reported that her heart rate would fall to the 40s, metoprolol  dose was decreased to 12.5 mg twice daily.  At follow-up clinic on 07/03/2019, she was noted to be back in atrial fibrillation.  She was referred to the A. fib clinic, and rhythm control options were discussed but patient elected to continue with rate control strategy.  Zio patch x3 days on 12/02/2019 showed 100s in AF burden with average rate 78 bpm.  Calcium  score 121 (50th percentile) on 01/09/2021.  Echocardiogram 09/2022 showed EF 55%, normal RV function, severe biatrial enlargement, moderate TR, moderate AI.  Since her last clinic visit, she reports she is doing well.  Denies any chest pain, dyspnea, lightheadedness, syncope, lower extremity edema, or palpitations.  Taking eliquis , no bleeding issues. Continues to exercise regularly.    Wt Readings from Last 3 Encounters:  08/29/23 112 lb (50.8 kg)  08/22/23 113 lb  (51.3 kg)  08/02/23 105 lb (47.6 kg)     Past Medical History:  Diagnosis Date   A-fib (HCC)    Allergy    seasonal   Anemia    Cataract    bil cateracts removed   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 10/16/2017   GERD (gastroesophageal reflux disease)    Hyperlipidemia    Irritable bowel syndrome with diarrhea 07/16/2022   Lactose intolerance 10/16/2017   Lymphocytic colitis w/ patchy collagen deposition 09/28/2016   Rheumatoid arthritis (HCC)    Status post dilation of esophageal narrowing 2014   Wears glasses     Past Surgical History:  Procedure Laterality Date   CARDIOVERSION N/A 05/11/2019   Procedure: CARDIOVERSION;  Surgeon: Sheryle Donning, MD;  Location[facility] ENDOSCOPY;  Service: Cardiovascular;  Laterality: N/A;   CATARACT EXTRACTION  06/30/2016 -right eye   left eye in 08/15/16   COLONOSCOPY     DILATION AND CURETTAGE OF UTERUS  2009   ESOPHAGEAL DILATION  05/31/1997   REPAIR EXTENSOR TENDON Right 05/14/2013   Procedure: LONG FINGER EXTENSOR TO ELEVATE RING AND SMALL FINGERS ;  Surgeon: Amelie Baize., MD;  Location: [facility];  Service: Orthopedics;  Laterality: Right;   seborrheic keratosis  09/03/2012   inflamed, excised from forehead   TENDON TRANSFER Right 09/03/2013   Procedure: RIGHT HAND FLEXOR CARPI RADIALUS TRANSFER TO LONG/RING/SMALL;  Surgeon: Amelie Baize., MD;  Location: [facility];  Service: Orthopedics;  Laterality: Right;  TONSILLECTOMY     TOOTH EXTRACTION  05/2022   TUBAL LIGATION     ULNAR HEAD EXCISION Right 05/14/2013   Procedure: ARTHOPLASTY DISTAL RADIAL ULNAR JOINT, RIGHT ;  Surgeon: Amelie Baize., MD;  Location: [facility];  Service: Orthopedics;  Laterality: Right;   UPPER GASTROINTESTINAL ENDOSCOPY      Current Medications: Current Meds  Medication Sig   acetaminophen  (TYLENOL ) 500 MG tablet Take 500-1,000 mg by mouth every 8 (eight) hours as needed for moderate pain or  headache.    atorvastatin  (LIPITOR) 20 MG tablet Take 1 tablet (20 mg total) by mouth 3 (three) times a week.   budesonide  (ENTOCORT EC ) 3 MG 24 hr capsule Take 3 capsules (9 mg total) by mouth every morning.   calcium  carbonate (TUMS EX) 750 MG chewable tablet Chew 1,500 mg by mouth as needed.   Clobetasol  Propionate 0.05 % shampoo Apply topically 3 (three) times a week.   diphenhydrAMINE (BENADRYL) 25 MG tablet Take 25 mg by mouth as needed.   diphenoxylate -atropine  (LOMOTIL ) 2.5-0.025 MG tablet Take 2 tablets by mouth 4 (four) times daily as needed for diarrhea or loose stools. TAKE 2 TABLETS BY MOUTH 4 TIMES DAILY AS NEEDED FOR DIARRHEA OR LOOSE STOOLS MAXIMUM 8 TABLETS DAILY   ELIQUIS  2.5 MG TABS tablet TAKE 1 TABLET BY MOUTH TWICE A DAY   esomeprazole  (NEXIUM ) 40 MG capsule TAKE 1 CAPSULE BY MOUTH EVERY DAY BEFORE BREAKFAST   hydrocortisone  cream 1 % Apply 1 Application topically daily as needed for itching (burns).   inFLIXimab  (REMICADE ) 100 MG injection Inject 10 mg/kg into the vein every 8 (eight) weeks.   Ivermectin 1 % CREA 1 application to affected area   lactase (LACTAID) 3000 units tablet 1 tablet   loratadine (CLARITIN) 10 MG tablet    methylPREDNISolone  (MEDROL  DOSEPAK) 4 MG TBPK tablet As directed   metoprolol  succinate (TOPROL -XL) 25 MG 24 hr tablet Take 1 tablet (25 mg total) by mouth daily.   minoxidil (ROGAINE) 2 % external solution Apply 1 application topically 2 (two) times daily.   Multiple Vitamin (MULTIVITAMIN) tablet Take 1 tablet by mouth daily.   neomycin-bacitracin-polymyxin (NEOSPORIN) ointment Apply 1 application topically as needed for wound care.     Allergies:   Cox-2 inhibitors (sulfonamide), Leflunomide, Methotrexate, Rosuvastatin , Aspirin, Glucose, Lactose intolerance (gi), Oxycodone -acetaminophen , and Sulfa antibiotics   Social History   Socioeconomic History   Marital status: Widowed    Spouse name: Aron Lard   Number of children: 1   Years of  education: 16   Highest education level: Not on file  Occupational History   Occupation: Retired- Dentist: RETIRED  Tobacco Use   Smoking status: Never   Smokeless tobacco: Never  Vaping Use   Vaping status: Never Used  Substance and Sexual Activity   Alcohol use: Yes    Alcohol/week: 21.0 standard drinks of alcohol    Types: 21 Standard drinks or equivalent per week    Comment: 3 times weekly   Drug use: No   Sexual activity: Not on file  Other Topics Concern   Not on file  Social History Narrative   Son in [place]   Widowed 05/19/2022-moved to [facility] 05/2022   Social Drivers of Health   Financial Resource Strain: Low Risk  (04/02/2023)   Overall Financial Resource Strain (CARDIA)    Difficulty of Paying Living Expenses: Not hard at all  Food Insecurity: No Food Insecurity (04/02/2023)   Hunger Vital  Sign    Worried About Programme researcher, broadcasting/film/video in the Last Year: Never true    Ran Out of Food in the Last Year: Never true  Transportation Needs: No Transportation Needs (04/02/2023)   PRAPARE - Administrator, Civil Service (Medical): No    Lack of Transportation (Non-Medical): No  Physical Activity: Sufficiently Active (04/02/2023)   Exercise Vital Sign    Days of Exercise per Week: 4 days    Minutes of Exercise per Session: 50 min  Stress: No Stress Concern Present (04/02/2023)   [employer] of Occupational Health - Occupational Stress Questionnaire    Feeling of Stress : Only a little  Social Connections: Socially Isolated (04/02/2023)   Social Connection and Isolation Panel [NHANES]    Frequency of Communication with Friends and Family: Three times a week    Frequency of Social Gatherings with Friends and Family: More than three times a week    Attends Religious Services: Never    Database administrator or Organizations: No    Attends Banker Meetings: Never    Marital Status: Widowed     Family History: The patient's family  history includes COPD in her father; Colon cancer in her paternal uncle; Heart disease in her mother; Osteoporosis in her maternal aunt, maternal grandmother, and mother. There is no history of Esophageal cancer, Pancreatic cancer, Rectal cancer, or Stomach cancer.  ROS:   Please see the history of present illness.    A whole 8 I get asked the minimum of once a day bowel dialysis I assessment all other systems reviewed and are negative.  EKGs/Labs/Other Studies Reviewed:    The following studies were reviewed today:   EKG:   10/24/2021: Atrial fibrillation, rate 83 04/11/22: Afib, rate 89 02/27/2023: Atrial fibrillation, rate 67 08/29/23: Afib, rate 76  Recent Labs: 04/01/2023: TSH 2.18 07/10/2023: ALT 16; BUN 11; Creatinine, Ser 0.77; Hemoglobin 13.7; Platelets 200.0; Potassium 4.1; Sodium 137  Recent Lipid Panel    Component Value Date/Time   CHOL 234 (H) 04/01/2023 0935   TRIG 73 04/01/2023 0935   HDL 132 04/01/2023 0935   CHOLHDL 1.8 04/01/2023 0935   VLDL 19 08/03/2016 1001   LDLCALC 86 04/01/2023 0935   LDLDIRECT 149 (H) 11/11/2007 2043   TTE 04/30/19: 1. Left ventricular ejection fraction, by visual estimation, is 60 to  65%. The left ventricle has normal function. Left ventricular septal wall  thickness was normal. Normal left ventricular posterior wall thickness.  There is no left ventricular  hypertrophy.   2. Left ventricular diastolic parameters are indeterminate.   3. The left ventricle has no regional wall motion abnormalities.   4. Global right ventricle has normal systolic function.The right  ventricular size is normal. No increase in right ventricular wall  thickness.   5. Left atrial size was severely dilated.   6. Right atrial size was mildly dilated.   7. The mitral valve is normal in structure. Mild mitral valve  regurgitation. No evidence of mitral stenosis.   8. The tricuspid valve is normal in structure.   9. The aortic valve is tricuspid. Aortic  valve regurgitation is mild. No  evidence of aortic valve sclerosis or stenosis.  10. The pulmonic valve was normal in structure. Pulmonic valve  regurgitation is not visualized.  11. Normal pulmonary artery systolic pressure.  12. The inferior vena cava is dilated in size with >50% respiratory  variability, suggesting right atrial pressure of 8 mmHg.  Physical Exam:    VS:  BP 128/70 (BP Location: Left Arm, Patient Position: Sitting, Cuff Size: Normal)   Pulse 76   Ht 5\' 2"  (1.575 m)   Wt 112 lb (50.8 kg)   SpO2 97%   BMI 20.49 kg/m     Wt Readings from Last 3 Encounters:  08/29/23 112 lb (50.8 kg)  08/22/23 113 lb (51.3 kg)  08/02/23 105 lb (47.6 kg)     GEN: in no acute distress HEENT: Normal NECK: No JVD CARDIAC: irregular,  no murmurs, rubs, gallops RESPIRATORY:  Clear to auscultation without rales, wheezing or rhonchi  ABDOMEN: Soft, non-tender, non-distended MUSCULOSKELETAL:  No edema; No deformity  SKIN: Warm and dry NEUROLOGIC:  Alert and oriented x 3 PSYCHIATRIC:  Normal affect   ASSESSMENT:    1. Permanent atrial fibrillation (HCC)   2. Aortic valve insufficiency, etiology of cardiac valve disease unspecified   3. Hyperlipidemia, unspecified hyperlipidemia type     PLAN:     Permanent atrial fibrillation: CHADS-VASc score 3 (agex2, female).  Appears rate controlled on metoprolol .  Successful DCCV on 05/11/2019, however was back in atrial fibrillation at follow-up appointment on 07/03/2019.  TTE shows normal LV systolic function, severe left atrial dilatation.  She was seen in AF clinic and decided to pursue rate control strategy as rates well controlled and appears asymptomatic.  Zio patch x3 days on 12/02/2019 showed 100% AF burden with average rate 78 bpm. -Continue Eliquis .  Given age>80 and weight <60kg, meets indication for reduced dose.  Will continue 2.5 mg twice daily -Continue Toprol -XL 25 mg daily   Aortic regurgitation: Moderate on echo 09/2022.  Will  monitor, plan repeat echocardiogram 09/2023  Hyperlipidemia: LDL 118 05/2199, she started on rosuvastatin  5 mg three times weekly and LDL improved to 59 02/2020.  She stopped taking the rosuvastatin .  Calcium  score 121 (50th percentile) on 01/09/2021.  Restarted atorvastatin  20 mg 3 times weekly.  LDL 96 on 03/26/2022.  Also with excellent HDL (121).  Pulmonary nodules: Bilateral small pulmonary nodules noted on calcium  score 12/2020.  Stable on CT chest 12/2021  RTC in 1 year   Medication Adjustments/Labs and Tests Ordered: Current medicines are reviewed at length with the patient today.  Concerns regarding medicines are outlined above.  Orders Placed This Encounter  Procedures   EKG 12-Lead   ECHOCARDIOGRAM COMPLETE    No orders of the defined types were placed in this encounter.    Patient Instructions  Medication Instructions:  Continue current medications *If you need a refill on your cardiac medications before your next appointment, please call your pharmacy*  Lab Work: none If you have labs (blood work) drawn today and your tests are completely normal, you will receive your results only by: MyChart Message (if you have MyChart) OR A paper copy in the mail If you have any lab test that is abnormal or we need to change your treatment, we will call you to review the results.  Testing/Procedures:  Need to Echo schedule for June 2025  Follow-Up: At [facility], you and your health needs are our priority.  As part of our continuing mission to provide you with exceptional heart care, our providers are all part of one team.  This team includes your primary Cardiologist (physician) and Advanced Practice Providers or APPs (Physician Assistants and Nurse Practitioners) who all work together to provide you with the care you need, when you need it.  Your next appointment:   1 year(s)  Provider:   Wendie Hamburg, MD    We recommend signing up for the patient portal  called "MyChart".  Sign up information is provided on this After Visit Summary.  MyChart is used to connect with patients for Virtual Visits (Telemedicine).  Patients are able to view lab/test results, encounter notes, upcoming appointments, etc.  Non-urgent messages can be sent to your provider as well.   To learn more about what you can do with MyChart, go to [email or web address].   Other Instructions none       Signed, Wendie Hamburg, MD  08/29/2023 5:52 PM    [facility]

## 2023-08-29 ENCOUNTER — Ambulatory Visit: Payer: Medicare PPO | Attending: Cardiology | Admitting: Cardiology

## 2023-08-29 ENCOUNTER — Encounter: Payer: Self-pay | Admitting: Cardiology

## 2023-08-29 VITALS — BP 128/70 | HR 76 | Ht 62.0 in | Wt 112.0 lb

## 2023-08-29 DIAGNOSIS — I4821 Permanent atrial fibrillation: Secondary | ICD-10-CM

## 2023-08-29 DIAGNOSIS — E785 Hyperlipidemia, unspecified: Secondary | ICD-10-CM

## 2023-08-29 DIAGNOSIS — I351 Nonrheumatic aortic (valve) insufficiency: Secondary | ICD-10-CM

## 2023-08-29 NOTE — Patient Instructions (Addendum)
 Medication Instructions:  Continue current medications *If you need a refill on your cardiac medications before your next appointment, please call your pharmacy*  Lab Work: none If you have labs (blood work) drawn today and your tests are completely normal, you will receive your results only by: MyChart Message (if you have MyChart) OR A paper copy in the mail If you have any lab test that is abnormal or we need to change your treatment, we will call you to review the results.  Testing/Procedures:  Need to Echo schedule for June 2025  Follow-Up: At [facility], you and your health needs are our priority.  As part of our continuing mission to provide you with exceptional heart care, our providers are all part of one team.  This team includes your primary Cardiologist (physician) and Advanced Practice Providers or APPs (Physician Assistants and Nurse Practitioners) who all work together to provide you with the care you need, when you need it.  Your next appointment:   1 year(s)  Provider:   Wendie Hamburg, MD    We recommend signing up for the patient portal called "MyChart".  Sign up information is provided on this After Visit Summary.  MyChart is used to connect with patients for Virtual Visits (Telemedicine).  Patients are able to view lab/test results, encounter notes, upcoming appointments, etc.  Non-urgent messages can be sent to your provider as well.   To learn more about what you can do with MyChart, go to [email or web address].   Other Instructions none

## 2023-09-02 ENCOUNTER — Telehealth: Payer: Self-pay | Admitting: Internal Medicine

## 2023-09-02 DIAGNOSIS — K52832 Lymphocytic colitis: Secondary | ICD-10-CM

## 2023-09-02 MED ORDER — BUDESONIDE 3 MG PO CPEP
9.0000 mg | ORAL_CAPSULE | ORAL | 1 refills | Status: DC
Start: 2023-09-02 — End: 2024-01-16

## 2023-09-02 NOTE — Telephone Encounter (Signed)
 [name] is going on vacation next week and wants to have a rx for Budesonide  3mg  tablets to take with her in case she can't find the foods she usually eats. She is doing well she said to tell Dr Willy Harvest. She has 3 days left in her current rx of the Budesonide . She is taking 3mg  tablets, 3 every AM. If you approve the refill she wants to know if the sig would be the same. Please advise. She is aware it may be mid week before this is addressed. She wants me to call her back. I confirmed her [facility].

## 2023-09-02 NOTE — Telephone Encounter (Signed)
 I let Anushka know the plan and she said Thank you very much. She verbalized understanding how to take it.

## 2023-09-02 NOTE — Telephone Encounter (Signed)
 PT is calling to have a refill for budesonide  sent in. She is going on a trip and wants to have a partial refill. Please advise.

## 2023-09-02 NOTE — Telephone Encounter (Signed)
 I refilled it and suggest she stay on it through her vacation 9 mg daily

## 2023-09-05 DIAGNOSIS — M0609 Rheumatoid arthritis without rheumatoid factor, multiple sites: Secondary | ICD-10-CM | POA: Diagnosis not present

## 2023-10-02 ENCOUNTER — Ambulatory Visit: Payer: Medicare PPO | Admitting: Podiatry

## 2023-10-02 ENCOUNTER — Encounter: Payer: Self-pay | Admitting: Podiatry

## 2023-10-02 DIAGNOSIS — M79675 Pain in left toe(s): Secondary | ICD-10-CM | POA: Diagnosis not present

## 2023-10-02 DIAGNOSIS — B351 Tinea unguium: Secondary | ICD-10-CM

## 2023-10-02 DIAGNOSIS — M79674 Pain in right toe(s): Secondary | ICD-10-CM | POA: Diagnosis not present

## 2023-10-09 NOTE — Progress Notes (Signed)
  Subjective:  Patient ID: [name], female    DOB: 1940/10/23,  MRN: [id number]  [name] presents to clinic today for painful elongated mycotic toenails 1-5 bilaterally which are tender when wearing enclosed shoe gear. Pain is relieved with periodic professional debridement.  Chief Complaint  Patient presents with   routine foot care    Rm16/ RFC not diabetic/   New problem(s): None.   PCP is Baxley, Jaynie Meyers, MD.  Allergies  Allergen Reactions   Cox-2 Inhibitors (Sulfonamide) Nausea And Vomiting    Severe abd pain   Leflunomide Other (See Comments)    Other reaction(s): hairloss   Methotrexate     Other reaction(s): hairloss   Rosuvastatin  Diarrhea   Aspirin Nausea Only    Other reaction(s): GI upset   Glucose Diarrhea    cramps   Lactose Intolerance (Gi) Diarrhea    Stomach cramps   Oxycodone -Acetaminophen      Other reaction(s): Unknown   Sulfa Antibiotics Other (See Comments)    Unknown as child    Review of Systems: Negative except as noted in the HPI.  Objective: No changes noted in today's physical examination. There were no vitals filed for this visit. [name] is a pleasant 83 y.o. female in NAD. AAO x 3.  Vascular Examination: Capillary refill time immediate b/l. Vascular status intact b/l with palpable pedal pulses. Pedal hair present b/l. No pain with calf compression b/l. Skin temperature gradient WNL b/l. No cyanosis or clubbing b/l. No ischemia or gangrene noted b/l. Varicosities present b/l.  Neurological Examination: Sensation grossly intact b/l with 10 gram monofilament.   Dermatological Examination: Pedal skin with normal turgor, texture and tone b/l.  No open wounds. No interdigital macerations.   Toenails 1-5 b/l thick, discolored, elongated with subungual debris and pain on dorsal palpation.   No corns, calluses nor porokeratotic lesions noted.  Musculoskeletal Examination: Muscle strength 5/5 to all lower extremity  muscle groups bilaterally. Hammertoe deformity noted 2-5 b/l.  Radiographs: None  Assessment/Plan: 1. Pain due to onychomycosis of toenails of both feet   Consent given for treatment. Patient examined. All patient's and/or POA's questions/concerns addressed on today's visit. Mycotic toenails 1-5 debrided in length and girth without incident. Continue soft, supportive shoe gear daily. Report any pedal injuries to medical professional. Call office if there are any quesitons/concerns. -Patient/POA to call should there be question/concern in the interim.   Return in about 3 months (around 01/02/2024).  [name], DPM      [facility] LOCATION: [address] [place] [address]                   Office [phone]   [facility] LOCATION: [address] [place], [place] [address] Office [phone]

## 2023-10-16 DIAGNOSIS — Z1231 Encounter for screening mammogram for malignant neoplasm of breast: Secondary | ICD-10-CM | POA: Diagnosis not present

## 2023-10-22 DIAGNOSIS — N6489 Other specified disorders of breast: Secondary | ICD-10-CM | POA: Diagnosis not present

## 2023-10-22 DIAGNOSIS — R92333 Mammographic heterogeneous density, bilateral breasts: Secondary | ICD-10-CM | POA: Diagnosis not present

## 2023-10-23 ENCOUNTER — Ambulatory Visit (HOSPITAL_COMMUNITY)

## 2023-10-24 ENCOUNTER — Other Ambulatory Visit: Payer: Self-pay

## 2023-10-31 DIAGNOSIS — Z79899 Other long term (current) drug therapy: Secondary | ICD-10-CM | POA: Diagnosis not present

## 2023-10-31 DIAGNOSIS — M0609 Rheumatoid arthritis without rheumatoid factor, multiple sites: Secondary | ICD-10-CM | POA: Diagnosis not present

## 2023-11-07 ENCOUNTER — Ambulatory Visit: Admitting: Internal Medicine

## 2023-11-12 DIAGNOSIS — L239 Allergic contact dermatitis, unspecified cause: Secondary | ICD-10-CM | POA: Diagnosis not present

## 2023-11-12 DIAGNOSIS — L700 Acne vulgaris: Secondary | ICD-10-CM | POA: Diagnosis not present

## 2023-11-15 ENCOUNTER — Other Ambulatory Visit: Payer: Self-pay | Admitting: Internal Medicine

## 2023-11-27 ENCOUNTER — Other Ambulatory Visit: Payer: Self-pay | Admitting: Cardiology

## 2023-11-27 DIAGNOSIS — I4819 Other persistent atrial fibrillation: Secondary | ICD-10-CM

## 2023-11-27 NOTE — Telephone Encounter (Signed)
 Prescription refill request for Eliquis  received. Indication:afib Last office visit:5/25 Scr:0.77  3/25 Age: 83 Weight:50.8  kg  Prescription refilled

## 2023-12-06 ENCOUNTER — Ambulatory Visit (HOSPITAL_COMMUNITY)
Admission: RE | Admit: 2023-12-06 | Discharge: 2023-12-06 | Disposition: A | Discharge status: Home / Self Care | Source: Ambulatory Visit | Attending: Cardiology | Admitting: Cardiology

## 2023-12-06 DIAGNOSIS — I4821 Permanent atrial fibrillation: Secondary | ICD-10-CM | POA: Insufficient documentation

## 2023-12-06 LAB — ECHOCARDIOGRAM COMPLETE
AR max vel: 2.35 cm2
AV Area VTI: 2.49 cm2
AV Area mean vel: 2.3 cm2
AV Mean grad: 2 mmHg
AV Peak grad: 4.4 mmHg
Ao pk vel: 1.05 m/s
Area-P 1/2: 3.7 cm2
S' Lateral: 2.93 cm

## 2023-12-09 ENCOUNTER — Ambulatory Visit: Payer: Self-pay | Admitting: Cardiology

## 2023-12-09 DIAGNOSIS — I4821 Permanent atrial fibrillation: Secondary | ICD-10-CM

## 2023-12-09 DIAGNOSIS — I351 Nonrheumatic aortic (valve) insufficiency: Secondary | ICD-10-CM

## 2023-12-26 DIAGNOSIS — M0609 Rheumatoid arthritis without rheumatoid factor, multiple sites: Secondary | ICD-10-CM | POA: Diagnosis not present

## 2024-01-15 ENCOUNTER — Ambulatory Visit: Admitting: Podiatry

## 2024-01-15 ENCOUNTER — Encounter: Payer: Self-pay | Admitting: Podiatry

## 2024-01-15 DIAGNOSIS — M79675 Pain in left toe(s): Secondary | ICD-10-CM | POA: Diagnosis not present

## 2024-01-15 DIAGNOSIS — M79674 Pain in right toe(s): Secondary | ICD-10-CM | POA: Diagnosis not present

## 2024-01-15 DIAGNOSIS — B351 Tinea unguium: Secondary | ICD-10-CM | POA: Diagnosis not present

## 2024-01-16 ENCOUNTER — Ambulatory Visit: Admitting: Internal Medicine

## 2024-01-16 ENCOUNTER — Encounter: Payer: Self-pay | Admitting: Internal Medicine

## 2024-01-16 VITALS — BP 112/72 | HR 72 | Ht 65.0 in | Wt 112.2 lb

## 2024-01-16 DIAGNOSIS — K900 Celiac disease: Secondary | ICD-10-CM

## 2024-01-16 DIAGNOSIS — K52832 Lymphocytic colitis: Secondary | ICD-10-CM

## 2024-01-16 DIAGNOSIS — K580 Irritable bowel syndrome with diarrhea: Secondary | ICD-10-CM

## 2024-01-16 DIAGNOSIS — E739 Lactose intolerance, unspecified: Secondary | ICD-10-CM | POA: Diagnosis not present

## 2024-01-16 NOTE — Patient Instructions (Addendum)
 Please avoid triggering foods.  Follow up in 1 year or sooner as needed.             Thank you for entrusting me with your care and for choosing [facility], Dr. Lupita Commander   _______________________________________________________  If your blood pressure at your visit was 140/90 or greater, please contact your primary care physician to follow up on this.  _______________________________________________________  If you are age 85 or older, your body mass index should be between 23-30. Your Body mass index is 18.68 kg/m. If this is out of the aforementioned range listed, please consider follow up with your Primary Care Provider.  If you are age 56 or younger, your body mass index should be between 19-25. Your Body mass index is 18.68 kg/m. If this is out of the aformentioned range listed, please consider follow up with your Primary Care Provider.   ________________________________________________________  The [facility] providers would like to encourage you to use MYCHART to communicate with providers for non-urgent requests or questions.  Due to long hold times on the telephone, sending your provider a message by [facility] may be a faster and more efficient way to get a response.  Please allow 48 business hours for a response.  Please remember that this is for non-urgent requests.  _______________________________________________________  [facility] is using a team-based approach to care.  Your team is made up of your doctor and two to three APPS. Our APPS (Nurse Practitioners and Physician Assistants) work with your physician to ensure care continuity for you. They are fully qualified to address your health concerns and develop a treatment plan. They communicate directly with your gastroenterologist to care for you. Seeing the Advanced Practice Practitioners on your physician's team can help you by facilitating care more promptly, often allowing for earlier  appointments, access to diagnostic testing, procedures, and other specialty referrals. '

## 2024-01-16 NOTE — Progress Notes (Signed)
 [name] 83 y.o. 1940-06-15 [id number]  Assessment & Plan:   Encounter Diagnoses  Name Primary?   Irritable bowel syndrome with diarrhea Yes   Lymphocytic colitis w/ patchy collagen deposition    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    Lactose intolerance    She will continue as needed Lomotil  I have advised and she will try to avoid food triggers If she develops more persistent diarrhea we would restart budesonide . FODMAP handout provided  Follow-up in 1 year or sooner as needed  CC: Veronica Ronal PARAS, MD  Subjective:   Chief Complaint: Diarrhea/IBS  HPI [name] is an 83 year old woman  who has a history of IBS-D, prior lymphocytic colitis with patchy collagen deposition (diagnosed at 2018 colonoscopy) treated with budesonide , suspected celiac disease and lactose intolerance.  She also has a history of rheumatoid arthritis and takes Remicade .  She was last seen August 22, 2023 doing well on budesonide .  We determined that her microscopic colitis was flaring and she was doing well.  She ended up stopping budesonide  as planned and has done well in the interim until lately when she says she has had dietary indiscretion so she been using some Lomotil  2 to 3 tablets a day.  She has formed bowel movements at times but if she eats foods like ice cream (she is lactose intolerant) she will get diarrhea and she has not been strict with avoiding food triggers.  She is interested in revisiting the FODMAPs diet also.  She tries to stay gluten-free as well as she has possible celiac disease but that has been somewhat challenging at [facility].  Wt Readings from Last 3 Encounters:  01/16/24 112 lb 4 oz (50.9 kg)  08/29/23 112 lb (50.8 kg)  08/22/23 113 lb (51.3 kg)    Allergies  Allergen Reactions   Cox-2 Inhibitors Nausea And Vomiting    Severe abd pain   Leflunomide Other (See Comments)    Other reaction(s): hairloss   Methotrexate     Other reaction(s): hairloss   Rosuvastatin  Diarrhea    Aspirin Nausea Only    Other reaction(s): GI upset   Glucose Diarrhea    cramps   Lactose Intolerance (Gi) Diarrhea    Stomach cramps   Oxycodone -Acetaminophen      Other reaction(s): Unknown   Sulfa Antibiotics Other (See Comments)    Unknown as child   Current Meds  Medication Sig   acetaminophen  (TYLENOL ) 500 MG tablet Take 500-1,000 mg by mouth every 8 (eight) hours as needed for moderate pain or headache.    atorvastatin  (LIPITOR) 20 MG tablet Take 1 tablet (20 mg total) by mouth 3 (three) times a week.   calcium  carbonate (TUMS EX) 750 MG chewable tablet Chew 1,500 mg by mouth as needed.   Clobetasol  Propionate 0.05 % shampoo Apply topically 3 (three) times a week.   diphenhydrAMINE (BENADRYL) 25 MG tablet Take 25 mg by mouth as needed.   diphenoxylate -atropine  (LOMOTIL ) 2.5-0.025 MG tablet TAKE 2 TABLETS BY MOUTH 4 TIMES DAILY AS NEEDED FOR DIARRHEA OR LOOSE STOOLS MAXIMUM 8 TABLETS DAILY   ELIQUIS  2.5 MG TABS tablet TAKE 1 TABLET BY MOUTH TWICE A DAY   esomeprazole  (NEXIUM ) 40 MG capsule TAKE 1 CAPSULE BY MOUTH EVERY DAY BEFORE BREAKFAST   hydrocortisone  cream 1 % Apply 1 Application topically daily as needed for itching (burns).   inFLIXimab  (REMICADE ) 100 MG injection Inject 10 mg/kg into the vein every 8 (eight) weeks.   Ivermectin 1 % CREA 1 application to affected  area   lactase (LACTAID) 3000 units tablet 1 tablet   loratadine (CLARITIN) 10 MG tablet    metoprolol  succinate (TOPROL -XL) 25 MG 24 hr tablet Take 1 tablet (25 mg total) by mouth daily.   minoxidil (ROGAINE) 2 % external solution Apply 1 application topically 2 (two) times daily.   Multiple Vitamin (MULTIVITAMIN) tablet Take 1 tablet by mouth daily.   neomycin-bacitracin-polymyxin (NEOSPORIN) ointment Apply 1 application topically as needed for wound care.   Past Medical History:  Diagnosis Date   A-fib [facility])    Allergy    seasonal   Anemia    Cataract    bil cateracts removed   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  10/16/2017   GERD (gastroesophageal reflux disease)    Hyperlipidemia    Irritable bowel syndrome with diarrhea 07/16/2022   Lactose intolerance 10/16/2017   Lymphocytic colitis w/ patchy collagen deposition 09/28/2016   Rheumatoid arthritis (HCC)    Status post dilation of esophageal narrowing 2014   Wears glasses    Past Surgical History:  Procedure Laterality Date   CARDIOVERSION N/A 05/11/2019   Procedure: CARDIOVERSION;  Surgeon: Lonni Slain, MD;  Location: [facility] ENDOSCOPY;  Service: Cardiovascular;  Laterality: N/A;   CATARACT EXTRACTION  06/30/2016 -right eye   left eye in 08/15/16   COLONOSCOPY     DILATION AND CURETTAGE OF UTERUS  2009   ESOPHAGEAL DILATION  05/31/1997   REPAIR EXTENSOR TENDON Right 05/14/2013   Procedure: LONG FINGER EXTENSOR TO ELEVATE RING AND SMALL FINGERS ;  Surgeon: Lamar LULLA Leonor Mickey., MD;  Location: [facility];  Service: Orthopedics;  Laterality: Right;   seborrheic keratosis  09/03/2012   inflamed, excised from forehead   TENDON TRANSFER Right 09/03/2013   Procedure: RIGHT HAND FLEXOR CARPI RADIALUS TRANSFER TO LONG/RING/SMALL;  Surgeon: Lamar LULLA Leonor Mickey., MD;  Location: [facility];  Service: Orthopedics;  Laterality: Right;   TONSILLECTOMY     TOOTH EXTRACTION  05/2022   TUBAL LIGATION     ULNAR HEAD EXCISION Right 05/14/2013   Procedure: ARTHOPLASTY DISTAL RADIAL ULNAR JOINT, RIGHT ;  Surgeon: Lamar LULLA Leonor Mickey., MD;  Location: [facility];  Service: Orthopedics;  Laterality: Right;   UPPER GASTROINTESTINAL ENDOSCOPY     Social History   Social History Narrative   Son in [place]   Widowed 05/19/2022-moved to [facility] 05/2022   family history includes COPD in her father; Colon cancer in her paternal uncle; Heart disease in her mother; Osteoporosis in her maternal aunt, maternal grandmother, and mother.   Review of Systems As per HPI  Objective:   Physical Exam .BP 112/72   Pulse  72   Ht 5' 5 (1.651 m)   Wt 112 lb 4 oz (50.9 kg)   BMI 18.68 kg/m

## 2024-01-19 NOTE — Progress Notes (Signed)
  Subjective:  Patient ID: [name], female    DOB: 1940/07/21,  MRN: [id number]  [name] presents to clinic today for preventative diabetic foot care for painful thick toenails that are difficult to trim. Pain interferes with ambulation. Aggravating factors include wearing enclosed shoe gear. Pain is relieved with periodic professional debridement.  Chief Complaint  Patient presents with   RFC     RFC Non diabetic toenail trim. LOV with PCP 08/02/23.   New problem(s): None.   PCP is Baxley, Ronal PARAS, MD.  Allergies  Allergen Reactions   Cox-2 Inhibitors Nausea And Vomiting    Severe abd pain   Leflunomide Other (See Comments)    Other reaction(s): hairloss   Methotrexate     Other reaction(s): hairloss   Rosuvastatin  Diarrhea   Aspirin Nausea Only    Other reaction(s): GI upset   Glucose Diarrhea    cramps   Lactose Intolerance (Gi) Diarrhea    Stomach cramps   Oxycodone -Acetaminophen      Other reaction(s): Unknown   Sulfa Antibiotics Other (See Comments)    Unknown as child    Review of Systems: Negative except as noted in the HPI.  Objective: No changes noted in today's physical examination. There were no vitals filed for this visit. [name] is a pleasant 83 y.o. female in NAD. AAO x 3.  Vascular Examination: Capillary refill time immediate b/l. Vascular status intact b/l with palpable pedal pulses. Pedal hair present b/l. No pain with calf compression b/l. Skin temperature gradient WNL b/l. No cyanosis or clubbing b/l. No ischemia or gangrene noted b/l. Varicosities present b/l.  Neurological Examination: Sensation grossly intact b/l with 10 gram monofilament.   Dermatological Examination: Pedal skin with normal turgor, texture and tone b/l.  No open wounds. No interdigital macerations.   Toenails 1-5 b/l thick, discolored, elongated with subungual debris and pain on dorsal palpation.   No corns, calluses nor porokeratotic lesions  noted.  Musculoskeletal Examination: Muscle strength 5/5 to all lower extremity muscle groups bilaterally. Hammertoe deformity noted 2-5 b/l.  Radiographs: None  Assessment/Plan: 1. Pain due to onychomycosis of toenails of both feet   Patient was evaluated and treated. All patient's and/or POA's questions/concerns addressed on today's visit. Toenails 1-5 debrided in length and girth without incident. Continue soft, supportive shoe gear daily. Report any pedal injuries to medical professional. Call office if there are any questions/concerns. -Patient/POA to call should there be question/concern in the interim.   Return in about 4 months (around 05/16/2024).  [name] Merlin, DPM      [facility] LOCATION: [address], [place] [address]                   Office [phone]   [facility] LOCATION: [address] [place], [place] [address] Office [phone]

## 2024-02-10 ENCOUNTER — Other Ambulatory Visit: Payer: Self-pay | Admitting: Internal Medicine

## 2024-02-10 NOTE — Telephone Encounter (Signed)
 Please find out if she wants a renewal

## 2024-02-11 NOTE — Telephone Encounter (Signed)
 I spoke with [name] and she just wants to have this rx on hand to use PRN. Hers was outdated. Please advise Sir, thank you.

## 2024-02-12 ENCOUNTER — Other Ambulatory Visit: Payer: Self-pay | Admitting: Internal Medicine

## 2024-02-26 DIAGNOSIS — M0609 Rheumatoid arthritis without rheumatoid factor, multiple sites: Secondary | ICD-10-CM | POA: Diagnosis not present

## 2024-02-26 DIAGNOSIS — Z111 Encounter for screening for respiratory tuberculosis: Secondary | ICD-10-CM | POA: Diagnosis not present

## 2024-02-26 DIAGNOSIS — R5383 Other fatigue: Secondary | ICD-10-CM | POA: Diagnosis not present

## 2024-02-27 ENCOUNTER — Other Ambulatory Visit: Payer: Self-pay | Admitting: Cardiology

## 2024-03-10 DIAGNOSIS — K52832 Lymphocytic colitis: Secondary | ICD-10-CM | POA: Diagnosis not present

## 2024-03-10 DIAGNOSIS — M25572 Pain in left ankle and joints of left foot: Secondary | ICD-10-CM | POA: Diagnosis not present

## 2024-03-10 DIAGNOSIS — M0609 Rheumatoid arthritis without rheumatoid factor, multiple sites: Secondary | ICD-10-CM | POA: Diagnosis not present

## 2024-03-10 DIAGNOSIS — I7300 Raynaud's syndrome without gangrene: Secondary | ICD-10-CM | POA: Diagnosis not present

## 2024-03-10 DIAGNOSIS — M1991 Primary osteoarthritis, unspecified site: Secondary | ICD-10-CM | POA: Diagnosis not present

## 2024-03-10 DIAGNOSIS — Z681 Body mass index (BMI) 19 or less, adult: Secondary | ICD-10-CM | POA: Diagnosis not present

## 2024-03-30 NOTE — Progress Notes (Incomplete)
 Annual Wellness Visit   Patient Care Team: Baxley, Ronal PARAS, MD as PCP - General (Internal Medicine) Kate Lonni CROME, MD as PCP - Cardiology (Cardiology) Harvey Seltzer, MD (Family Medicine) Dyane Rush, MD (Inactive) (Gastroenterology) Rosan Credit, MD (Ophthalmology) Henry Slough, MD (Obstetrics and Gynecology) Dwane Lerner, MD (Dermatology) Carolee Acosta (Dental General Practice)  Visit Date: 03/30/24   No chief complaint on file.  Subjective:  Patient: [name], Female DOB: [birth year], 83 y.o. MRN: [id number] There were no vitals filed for this visit. EULANDA DORION is a 83 y.o. Female who presents today for her Annual Wellness Visit. 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  History of hypertension treated with metoprolol  succinate 25 mg daily. Blood pressure normal in-office today at ***.   History of hyperlipidemia treated with atorvastatin  20 mg three times weekly. Lipid Panel ***.   History of GERD treated with esomeprazole  40 mg daily.   History of  permanent atrial fibrillation and is on Eliquis . 04/30/2019 TTE shows normal LV systolic function, severe left atrial dilatation. Zio patch x3 days on 12/02/2019 showed 100% AF burden with average rate 78 bpm.    History of IBS-D, lymphocytic colitis with patchy collagen deposition, suspected Celiac, lactose intolerance. Seen by Dr. Avram and is on Lomotil , Nexium . Last budesonide  treatment 2020.   History of Rheumatoid Arthritis treated with IV Remicade  by [facility].  History of hearing loss and tinnitus.  Was diagnosed with new onset atrial fibrillation in November 2020.  Had cardioversion in January 2021 and subsequently went back into atrial.  Rate was controlled.  Echocardiogram in December 2020 indicated she had left atrial size severely dilated and right atrial size mildly dilated.  Chads 2 score was 30.  She is on Eliquis .   In January 2015 she had surgery by Dr. Leonor for rupture of the extensor digitorum longus right ring finger and rupture of extensor digiti minimus right small finger.  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  Remote history of plantar fasciitis.  History of mild osteopenia based on bone density study.    History of environmental allergies.     Labs ***/***/*** {Labs (Optional):31667}   10/08/2022 Mammogram no mammographic evidence of malignancy. Repeat in one year.    02/23/2022 Bone density Left 1/3 radius BMD 0.608 Tscore -1.40.    09/20/2016 Colonoscopy Diverticulosis in the sigmoid colon. The examination was otherwise normal on direct and retroflexion views. Biopsies were taken with a cold forceps from the right colon, left colon and rectum for evaluation of microscopic colitis.   Health Maintenance  Topic Date Due   Zoster Vaccines- Shingrix (1 of 2) 01/10/1960   COVID-19 Vaccine (6 - 2025-26 season) 12/30/2023   Medicare Annual Wellness (AWV)  04/01/2024  LIPID PANEL   03/31/2024   Mammogram  10/15/2024   DTaP/Tdap/Td (4 - Td or Tdap) 01/08/2033   Pneumococcal Vaccine: 50+ Years  Completed   Influenza Vaccine  Completed   Bone Density Scan  Completed   Meningococcal B Vaccine  Aged Out    {Man or Woman:32389}  Vaccine Counseling: Due for {Vaccines:32291::Influenza}; UTD on {Vaccines:32291::Influenza}  ROS Objective:  Vitals: body mass index is unknown because there is no height or weight on file.There were no vitals filed for this visit. Physical Exam  Current Outpatient Medications  Medication Instructions   acetaminophen  (TYLENOL ) 500-1,000 mg, Every 8 hours PRN   atorvastatin  (LIPITOR) 20 MG tablet TAKE 1 TABLET BY MOUTH 3 TIMES A WEEK.   calcium  carbonate (TUMS EX) 1,500 mg, As needed   Clobetasol  Propionate 0.05 % shampoo 3 times weekly   diphenhydrAMINE (BENADRYL) 25 mg, As needed   diphenoxylate -atropine  (LOMOTIL ) 2.5-0.025 MG tablet TAKE 2 TABLETS BY MOUTH 4 TIMES DAILY AS NEEDED FOR DIARRHEA OR LOOSE STOOLS MAXIMUM 8 TABLETS DAILY   Eliquis  2.5 mg, Oral, 2 times daily   esomeprazole  (NEXIUM ) 40 MG capsule TAKE 1 CAPSULE BY MOUTH EVERY DAY BEFORE BREAKFAST   hydrocortisone  cream 1 % 1 Application, Topical, Daily PRN   inFLIXimab  (REMICADE ) 100 MG injection 10 mg/kg, Every 8 weeks   Ivermectin 1 % CREA 1 application to affected area   lactase (LACTAID) 3000 units tablet 1 tablet   loratadine (CLARITIN) 10 MG tablet    metoprolol  succinate (TOPROL -XL) 25 mg, Oral, Daily   minoxidil (ROGAINE) 2 % external solution 1 application , 2 times daily   Multiple Vitamin (MULTIVITAMIN) tablet 1 tablet, Daily   neomycin-bacitracin-polymyxin (NEOSPORIN) ointment 1 application , As needed   nystatin -triamcinolone  ointment (MYCOLOG) APPLY TO AFFECTED AREA TWICE A DAY   Past Medical History:  Diagnosis Date   A-fib (HCC)    Allergy    seasonal   Anemia    Cataract    bil cateracts removed   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 10/16/2017   GERD  (gastroesophageal reflux disease)    Hyperlipidemia    Irritable bowel syndrome with diarrhea 07/16/2022   Lactose intolerance 10/16/2017   Lymphocytic colitis w/ patchy collagen deposition 09/28/2016   Rheumatoid arthritis (HCC)    Status post dilation of esophageal narrowing 2014   Wears glasses    Medical/Surgical History Narrative:  Allergic/Intolerant to:  Allergies  Allergen Reactions   Cox-2 Inhibitors Nausea And Vomiting    Severe abd pain   Leflunomide Other (See Comments)    Other reaction(s): hairloss   Methotrexate     Other reaction(s): hairloss   Rosuvastatin  Diarrhea   Aspirin Nausea Only    Other reaction(s): GI upset   Glucose Diarrhea    cramps   Lactose Intolerance (Gi) Diarrhea    Stomach cramps   Oxycodone -Acetaminophen      Other reaction(s): Unknown   Sulfa Antibiotics Other (See Comments)    Unknown as child   *** - ***  *** - ***  *** - ***  *** - ***  *** - ***  *** - ***  *** - ***  *** - *** Other - Hx of: *** ; Surghx of: *** Past Surgical History:  Procedure Laterality Date   CARDIOVERSION N/A 05/11/2019   Procedure: CARDIOVERSION;  Surgeon: Lonni Slain, MD;  Location: [facility];  Service: Cardiovascular;  Laterality: N/A;   CATARACT EXTRACTION  06/30/2016 -right eye   left eye in 08/15/16   COLONOSCOPY  DILATION AND CURETTAGE OF UTERUS  2009   ESOPHAGEAL DILATION  05/31/1997   REPAIR EXTENSOR TENDON Right 05/14/2013   Procedure: LONG FINGER EXTENSOR TO ELEVATE RING AND SMALL FINGERS ;  Surgeon: Lamar LULLA Leonor Mickey., MD;  Location: [facility];  Service: Orthopedics;  Laterality: Right;   seborrheic keratosis  09/03/2012   inflamed, excised from forehead   TENDON TRANSFER Right 09/03/2013   Procedure: RIGHT HAND FLEXOR CARPI RADIALUS TRANSFER TO LONG/RING/SMALL;  Surgeon: Lamar LULLA Leonor Mickey., MD;  Location: [facility];  Service: Orthopedics;  Laterality: Right;   TONSILLECTOMY      TOOTH EXTRACTION  05/2022   TUBAL LIGATION     ULNAR HEAD EXCISION Right 05/14/2013   Procedure: ARTHOPLASTY DISTAL RADIAL ULNAR JOINT, RIGHT ;  Surgeon: Lamar LULLA Leonor Mickey., MD;  Location: [facility];  Service: Orthopedics;  Laterality: Right;   UPPER GASTROINTESTINAL ENDOSCOPY     Family History  Problem Relation Age of Onset   Heart disease Mother    Osteoporosis Mother    COPD Father    Osteoporosis Maternal Grandmother    Colon cancer Paternal Uncle    Osteoporosis Maternal Aunt    Esophageal cancer Neg Hx    Pancreatic cancer Neg Hx    Rectal cancer Neg Hx    Stomach cancer Neg Hx    Family History Narrative: {ELFamHX:31110} Social History   Social History Narrative   Son in [place]   Widowed 05/19/2022-moved to [place] 05/2022   Most Recent Health Risks Assessment:   Most Recent Social Determinants of Health (Including Hx of Tobacco, Alcohol, and Drug Use) SDOH Screenings   Food Insecurity: No Food Insecurity (04/02/2023)  Housing: Low Risk  (04/02/2023)  Transportation Needs: No Transportation Needs (04/02/2023)  Utilities: Not At Risk (04/02/2023)  Alcohol Screen: Low Risk  (04/02/2023)  Depression (PHQ2-9): Low Risk  (04/02/2023)  Financial Resource Strain: Low Risk  (04/02/2023)  Physical Activity: Sufficiently Active (04/02/2023)  Social Connections: Socially Isolated (04/02/2023)  Stress: No Stress Concern Present (04/02/2023)  Tobacco Use: Low Risk  (01/16/2024)  Health Literacy: Adequate Health Literacy (04/02/2023)   Social History   Tobacco Use   Smoking status: Never   Smokeless tobacco: Never  Vaping Use   Vaping status: Never Used  Substance Use Topics   Alcohol use: Yes    Alcohol/week: 21.0 standard drinks of alcohol    Types: 21 Standard drinks or equivalent per week    Comment: 3 times weekly   Drug use: No   Most Recent Functional Status Assessment:    04/02/2023    2:10 PM  In your present state of health, do you have any  difficulty performing the following activities:  Hearing? 0  Vision? 0  Difficulty concentrating or making decisions? 0  Walking or climbing stairs? 0  Dressing or bathing? 0  Doing errands, shopping? 0  Preparing Food and eating ? N  Using the Toilet? N  In the past six months, have you accidently leaked urine? N  Do you have problems with loss of bowel control? N  Managing your Medications? N  Managing your Finances? N  Housekeeping or managing your Housekeeping? N   Most Recent Fall Risk Assessment:    04/02/2023    2:11 PM  Fall Risk   Falls in the past year? 0  Number falls in past yr: 0  Injury with Fall? 0  Risk for fall due to : No Fall Risks  Follow up  Falls evaluation completed;Education provided;Falls prevention discussed   Most Recent Anxiety/Depression Screenings:    04/02/2023    2:11 PM 03/30/2022   11:09 AM  PHQ 2/9 Scores  PHQ - 2 Score 2 0  PHQ- 9 Score 2       Data saved with a previous flowsheet row definition       No data to display         Most Recent Cognitive Screening:    04/02/2023    2:12 PM  6CIT Screen  What Year? 0 points  What month? 0 points  What time? 0 points  Count back from 20 0 points  Months in reverse 0 points  Repeat phrase 0 points  Total Score 0 points   Most Recent Vision/Hearing Screenings:No results found. Results:  Studies Obtained And Personally Reviewed By Me: Diabetic Foot Exam - Simple   No data filed     {Imaging, colonoscopy, mammogram, bone density scan, echocardiogram, heart cath, stress test, CT calcium  score, etc.:32292}  Labs:  CBC w/ Differential Lab Results  Component Value Date   WBC 4.6 07/10/2023   RBC 3.91 07/10/2023   HGB 13.7 07/10/2023   HCT 40.0 07/10/2023   PLT 200.0 07/10/2023   MCV 102.4 (H) 07/10/2023   MCH 33.3 (H) 04/01/2023   MCHC 34.1 07/10/2023   RDW 13.3 07/10/2023   MPV 10.0 04/01/2023   LYMPHSABS 2,858 03/26/2022   MONOABS 414 08/03/2016   BASOSABS 31  04/01/2023    Comprehensive Metabolic Panel Lab Results  Component Value Date   NA 137 07/10/2023   K 4.1 07/10/2023   CL 101 07/10/2023   CO2 27 07/10/2023   GLUCOSE 97 07/10/2023   BUN 11 07/10/2023   CREATININE 0.77 07/10/2023   CALCIUM  9.4 07/10/2023   PROT 7.2 07/10/2023   ALBUMIN 4.0 07/10/2023   AST 20 07/10/2023   ALT 16 07/10/2023   ALKPHOS 48 07/10/2023   BILITOT 0.5 07/10/2023   GFR 70.80 07/10/2023   EGFR 86 04/01/2023   GFRNONAA 79 03/15/2020   Lipid Panel  Lab Results  Component Value Date   CHOL 234 (H) 04/01/2023   HDL 132 04/01/2023   LDLCALC 86 04/01/2023   TRIG 73 04/01/2023   A1c No results found for: HGBA1C  TSH Lab Results  Component Value Date   TSH 2.18 04/01/2023   PSA{PSA (Optional):32132} No results found for any visits on 04/09/24. Assessment & Plan:  No orders of the defined types were placed in this encounter.  No orders of the defined types were placed in this encounter.  Other Labs Reviewed today:    No follow-ups on file.   Annual Wellness Visit done today including the all of the following: Reviewed patient's Family Medical History Reviewed patient's SDOH and reviewed tobacco, alcohol, and drug use.  Reviewed and updated list of patient's medical providers Assessment of cognitive impairment was done Assessed patient's functional ability Established a written schedule for health screening services Health Risk Assessent Completed and Reviewed  Discussed health benefits of physical activity, and encouraged her to engage in regular exercise appropriate for her age and condition.    I,Makayla C Reid,acting as a scribe for Ronal JINNY Hailstone, MD.,have documented all relevant documentation on the behalf of Ronal JINNY Hailstone, MD,as directed by  Ronal JINNY Hailstone, MD while in the presence of Ronal JINNY Hailstone, MD.  I, Ronal JINNY Hailstone, MD, have reviewed all documentation for and agree with the above Annual Wellness Visit documentation.  Ronal JINNY Hailstone, MD Internal Medicine 04/09/2024

## 2024-04-06 ENCOUNTER — Telehealth: Payer: Self-pay | Admitting: Internal Medicine

## 2024-04-06 ENCOUNTER — Other Ambulatory Visit: Payer: Self-pay

## 2024-04-06 DIAGNOSIS — Z0000 Encounter for general adult medical examination without abnormal findings: Secondary | ICD-10-CM

## 2024-04-06 DIAGNOSIS — Z7901 Long term (current) use of anticoagulants: Secondary | ICD-10-CM

## 2024-04-06 DIAGNOSIS — M8580 Other specified disorders of bone density and structure, unspecified site: Secondary | ICD-10-CM

## 2024-04-06 DIAGNOSIS — I4821 Permanent atrial fibrillation: Secondary | ICD-10-CM

## 2024-04-07 ENCOUNTER — Other Ambulatory Visit

## 2024-04-07 ENCOUNTER — Other Ambulatory Visit: Payer: Self-pay

## 2024-04-07 DIAGNOSIS — M8580 Other specified disorders of bone density and structure, unspecified site: Secondary | ICD-10-CM

## 2024-04-07 DIAGNOSIS — Z7901 Long term (current) use of anticoagulants: Secondary | ICD-10-CM

## 2024-04-07 DIAGNOSIS — Z0000 Encounter for general adult medical examination without abnormal findings: Secondary | ICD-10-CM

## 2024-04-07 DIAGNOSIS — I4821 Permanent atrial fibrillation: Secondary | ICD-10-CM

## 2024-04-08 LAB — COMPREHENSIVE METABOLIC PANEL WITH GFR
AG Ratio: 1.5 (calc) (ref 1.0–2.5)
ALT: 15 U/L (ref 6–29)
AST: 19 U/L (ref 10–35)
Albumin: 4 g/dL (ref 3.6–5.1)
Alkaline phosphatase (APISO): 48 U/L (ref 37–153)
BUN: 17 mg/dL (ref 7–25)
CO2: 28 mmol/L (ref 20–32)
Calcium: 9.1 mg/dL (ref 8.6–10.4)
Chloride: 105 mmol/L (ref 98–110)
Creat: 0.73 mg/dL (ref 0.60–0.95)
Globulin: 2.7 g/dL (ref 1.9–3.7)
Glucose, Bld: 89 mg/dL (ref 65–99)
Potassium: 4.7 mmol/L (ref 3.5–5.3)
Sodium: 140 mmol/L (ref 135–146)
Total Bilirubin: 0.6 mg/dL (ref 0.2–1.2)
Total Protein: 6.7 g/dL (ref 6.1–8.1)
eGFR: 81 mL/min/1.73m2 (ref >=60)

## 2024-04-08 LAB — CBC WITH DIFFERENTIAL/PLATELET
Absolute Lymphocytes: 1820 {cells}/uL (ref 850–3900)
Absolute Monocytes: 481 {cells}/uL (ref 200–950)
Basophils Absolute: 59 {cells}/uL (ref 0–200)
Basophils Relative: 1.1 %
Eosinophils Absolute: 464 {cells}/uL (ref 15–500)
Eosinophils Relative: 8.6 %
HCT: 39.5 % (ref 35.9–46.0)
Hemoglobin: 13.3 g/dL (ref 11.7–15.5)
MCH: 33.6 pg — ABNORMAL HIGH (ref 27.0–33.0)
MCHC: 33.7 g/dL (ref 31.6–35.4)
MCV: 99.7 fL (ref 81.4–101.7)
MPV: 9.5 fL (ref 7.5–12.5)
Monocytes Relative: 8.9 %
Neutro Abs: 2576 {cells}/uL (ref 1500–7800)
Neutrophils Relative %: 47.7 %
Platelets: 202 Thousand/uL (ref 140–400)
RBC: 3.96 Million/uL (ref 3.80–5.10)
RDW: 12.7 % (ref 11.0–15.0)
Total Lymphocyte: 33.7 %
WBC: 5.4 Thousand/uL (ref 3.8–10.8)

## 2024-04-08 LAB — LIPID PANEL
Cholesterol: 198 mg/dL (ref <200)
HDL: 111 mg/dL (ref >=50)
LDL Cholesterol (Calc): 74 mg/dL
Non-HDL Cholesterol (Calc): 87 mg/dL (ref <130)
Total CHOL/HDL Ratio: 1.8 (calc) (ref <5.0)
Triglycerides: 54 mg/dL (ref <150)

## 2024-04-08 LAB — TSH: TSH: 1.36 m[IU]/L (ref 0.40–4.50)

## 2024-04-09 ENCOUNTER — Ambulatory Visit (INDEPENDENT_AMBULATORY_CARE_PROVIDER_SITE_OTHER): Admitting: Internal Medicine

## 2024-04-09 ENCOUNTER — Ambulatory Visit: Payer: Self-pay | Admitting: Internal Medicine

## 2024-04-09 ENCOUNTER — Encounter: Payer: Self-pay | Admitting: Internal Medicine

## 2024-04-09 ENCOUNTER — Ambulatory Visit: Admitting: Internal Medicine

## 2024-04-09 VITALS — BP 110/60 | HR 76 | Ht 62.0 in | Wt 113.0 lb

## 2024-04-09 DIAGNOSIS — M069 Rheumatoid arthritis, unspecified: Secondary | ICD-10-CM

## 2024-04-09 DIAGNOSIS — R7989 Other specified abnormal findings of blood chemistry: Secondary | ICD-10-CM

## 2024-04-09 DIAGNOSIS — K580 Irritable bowel syndrome with diarrhea: Secondary | ICD-10-CM | POA: Diagnosis not present

## 2024-04-09 DIAGNOSIS — K219 Gastro-esophageal reflux disease without esophagitis: Secondary | ICD-10-CM

## 2024-04-09 DIAGNOSIS — Z7901 Long term (current) use of anticoagulants: Secondary | ICD-10-CM

## 2024-04-09 DIAGNOSIS — I4821 Permanent atrial fibrillation: Secondary | ICD-10-CM

## 2024-04-09 DIAGNOSIS — Z0000 Encounter for general adult medical examination without abnormal findings: Secondary | ICD-10-CM | POA: Diagnosis not present

## 2024-04-09 DIAGNOSIS — E785 Hyperlipidemia, unspecified: Secondary | ICD-10-CM | POA: Diagnosis not present

## 2024-04-09 DIAGNOSIS — H903 Sensorineural hearing loss, bilateral: Secondary | ICD-10-CM

## 2024-04-09 DIAGNOSIS — I351 Nonrheumatic aortic (valve) insufficiency: Secondary | ICD-10-CM

## 2024-04-09 DIAGNOSIS — I10 Essential (primary) hypertension: Secondary | ICD-10-CM

## 2024-04-09 DIAGNOSIS — K52832 Lymphocytic colitis: Secondary | ICD-10-CM

## 2024-04-09 DIAGNOSIS — I340 Nonrheumatic mitral (valve) insufficiency: Secondary | ICD-10-CM

## 2024-04-09 DIAGNOSIS — M0600 Rheumatoid arthritis without rheumatoid factor, unspecified site: Secondary | ICD-10-CM

## 2024-04-09 NOTE — Progress Notes (Signed)
 "  Annual Wellness Visit   Patient Care Team: Tonianne Fine, Ronal PARAS, MD as PCP - General (Internal Medicine) Kate Lonni CROME, MD as PCP - Cardiology (Cardiology) Harvey Seltzer, MD (Family Medicine) Dyane Rush, MD (Inactive) (Gastroenterology) Rosan Credit, MD (Ophthalmology) Henry Slough, MD (Obstetrics and Gynecology) Dwane Lerner, MD (Dermatology) Carolee Acosta (Dental General Practice)  Visit Date: 04/09/2024   Chief Complaint  Patient presents with   Annual Exam   Medicare Wellness   Subjective:  Patient: [name], Female DOB: 1941-04-29, 83 y.o. MRN: [id number] Vitals:   04/09/24 1407  BP: 110/60   [name] is a 83 y.o. Female who presents today for her Annual Wellness Visit. 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   She currently resides in [facility]. She feels well and she currently has no complaints.    History of hypertension treated with metoprolol  succinate 25 mg daily. Blood pressure normal in-office today at 110/60.   History of hyperlipidemia  treated with atorvastatin  20 mg three times weekly. Lipid Panel normal.   History of GERD treated with esomeprazole  40 mg daily.   History of permanent atrial fibrillation and is on Eliquis . 04/30/2019 TTE shows normal LV systolic function, severe left atrial dilatation. Zio patch x3 days on 12/02/2019 showed 100% AF burden with average rate 78 bpm.    History of IBS-D, lymphocytic colitis with patchy collagen deposition, suspected Celiac, lactose intolerance. Seen by Dr. Avram and is on Lomotil , Nexium . Last budesonide  treatment 2020.   History of Rheumatoid Arthritis treated with IV Remicade  by [facility].  History of hearing loss and tinnitus.  Was diagnosed with new onset atrial fibrillation in November 2020.  Had cardioversion in January 2021 and subsequently went back into atrial.  Rate was controlled.  Echocardiogram in December 2020 indicated she had left atrial size severely dilated and right atrial size mildly dilated.  Chads 2 score was 30.  She is on Eliquis .   In January 2015 she had surgery by Dr. Leonor for rupture of the extensor digitorum longus right ring finger and rupture of extensor digiti minimus right small finger.  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  Remote history of plantar fasciitis.  Cardiologist is Dr. Kate. Had ECHO in August showing severe dilatation of left atrium, right atrium mild to moderately dilated.  Mitral valve is degenerative.  Moderate mitral valve regurgitation.  No evidence of mitral stenosis.  Aortic valve is tricuspid.  Aortic valve regurgitation is moderate.  No aortic stenosis present.  Left ventricular ejection fraction estimation 55 to 60%.  Patient is in permanent atrial fibrillation.  Remains on chronic anticoagulation with Eliquis .  History of mild osteopenia based on bone density study.    History of environmental allergies.  Labs 04/07/2024 MCH 33.6,  Otherwise WNL.   10/08/2022 Mammogram no mammographic evidence of malignancy. Repeat in one year.    02/23/2022 Bone density Left 1/3 radius BMD 0.608 Tscore -1.40.    09/20/2016 Colonoscopy Diverticulosis in the sigmoid colon. The examination was otherwise normal on direct and retroflexion views. Biopsies were taken with a cold forceps from the right colon, left colon and rectum for evaluation of microscopic colitis.   Health Maintenance  Topic Date Due   Zoster Vaccines- Shingrix (1 of 2) 01/10/1960   COVID-19 Vaccine (6 - 2025-26 season) 12/30/2023   Medicare Annual Wellness (AWV)  04/01/2024   Mammogram  10/15/2024   LIPID PANEL  04/07/2025   DTaP/Tdap/Td (4 - Td or Tdap) 01/08/2033   Pneumococcal Vaccine: 50+ Years  Completed   Influenza Vaccine  Completed   Bone Density Scan  Completed   Meningococcal B Vaccine  Aged Out     Review of Systems  Constitutional:  Negative for fever and malaise/fatigue.  HENT:  Negative for congestion.   Eyes:  Negative for blurred vision.  Respiratory:  Negative for cough and shortness of breath.   Cardiovascular:  Negative for chest pain, palpitations and leg swelling.  Gastrointestinal:  Negative for vomiting.  Musculoskeletal:  Negative for back pain.  Skin:  Negative for rash.  Neurological:  Negative for loss of consciousness and headaches.   Objective:  Vitals: body mass index is 20.67 kg/m. Today's Vitals   04/09/24 1407  BP: 110/60  Pulse: 76  SpO2: 98%  Weight: 113 lb (51.3 kg)  Height: 5' 2 (1.575 m)  PainSc: 0-No pain   Physical Exam Vitals and nursing note reviewed.  Constitutional:      General: She is not in acute distress.    Appearance: Normal appearance. She is not ill-appearing or toxic-appearing.  HENT:     Head: Normocephalic and atraumatic.     Right Ear: Hearing, tympanic membrane, ear canal and external ear normal.     Left Ear: Hearing, tympanic membrane, ear canal and external ear normal.      Mouth/Throat:     Pharynx: Oropharynx is clear.  Eyes:     Extraocular Movements: Extraocular movements intact.     Pupils: Pupils are equal, round, and reactive to light.  Neck:     Thyroid : No thyroid  mass, thyromegaly or thyroid  tenderness.     Vascular: No carotid bruit.  Cardiovascular:     Rate and Rhythm: Normal rate and regular rhythm. No extrasystoles are present.    Pulses:          Dorsalis pedis pulses are 2+ on the right side and 2+ on the left side.     Heart sounds: Normal heart sounds. No murmur heard.    No friction rub. No gallop.  Pulmonary:     Effort: Pulmonary effort is normal.     Breath sounds: Normal breath sounds. No decreased breath sounds, wheezing, rhonchi or rales.  Chest:     Chest wall: No mass.  Abdominal:     Palpations: Abdomen is soft. There is no hepatomegaly, splenomegaly or mass.     Tenderness: There is no abdominal tenderness.     Hernia: No hernia is present.  Musculoskeletal:     Cervical back: Normal range of motion.     Right lower leg: No edema.     Left lower leg: No edema.  Lymphadenopathy:     Cervical: No cervical adenopathy.     Upper Body:  Right upper body: No supraclavicular adenopathy.     Left upper body: No supraclavicular adenopathy.  Skin:    General: Skin is warm and dry.  Neurological:     General: No focal deficit present.     Mental Status: She is alert and oriented to person, place, and time. Mental status is at baseline.     Sensory: Sensation is intact.     Motor: Motor function is intact. No weakness.     Deep Tendon Reflexes: Reflexes are normal and symmetric.  Psychiatric:        Attention and Perception: Attention normal.        Mood and Affect: Mood normal.        Speech: Speech normal.        Behavior: Behavior normal.        Thought Content: Thought content normal.        Cognition and Memory: Cognition normal.        Judgment: Judgment normal.     Current Outpatient Medications  Medication  Instructions   acetaminophen  (TYLENOL ) 500-1,000 mg, Every 8 hours PRN   atorvastatin  (LIPITOR) 20 MG tablet TAKE 1 TABLET BY MOUTH 3 TIMES A WEEK.   calcium  carbonate (TUMS EX) 1,500 mg, As needed   Clobetasol  Propionate 0.05 % shampoo 3 times weekly   diphenhydrAMINE (BENADRYL) 25 mg, As needed   diphenoxylate -atropine  (LOMOTIL ) 2.5-0.025 MG tablet TAKE 2 TABLETS BY MOUTH 4 TIMES DAILY AS NEEDED FOR DIARRHEA OR LOOSE STOOLS MAXIMUM 8 TABLETS DAILY   Eliquis  2.5 mg, Oral, 2 times daily   esomeprazole  (NEXIUM ) 40 MG capsule TAKE 1 CAPSULE BY MOUTH EVERY DAY BEFORE BREAKFAST   hydrocortisone  cream 1 % 1 Application, Topical, Daily PRN   inFLIXimab  (REMICADE ) 100 MG injection 10 mg/kg, Every 8 weeks   Ivermectin 1 % CREA 1 application to affected area   lactase (LACTAID) 3000 units tablet 1 tablet   loratadine (CLARITIN) 10 MG tablet    metoprolol  succinate (TOPROL -XL) 25 mg, Oral, Daily   minoxidil (ROGAINE) 2 % external solution 1 application , 2 times daily   Multiple Vitamin (MULTIVITAMIN) tablet 1 tablet, Daily   neomycin-bacitracin-polymyxin (NEOSPORIN) ointment 1 application , As needed   nystatin -triamcinolone  ointment (MYCOLOG) APPLY TO AFFECTED AREA TWICE A DAY   Past Medical History:  Diagnosis Date   A-fib (HCC)    Allergy    seasonal   Anemia    Cataract    bil cateracts removed   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 10/16/2017   GERD (gastroesophageal reflux disease)    Hyperlipidemia    Irritable bowel syndrome with diarrhea 07/16/2022   Lactose intolerance 10/16/2017   Lymphocytic colitis w/ patchy collagen deposition 09/28/2016   Rheumatoid arthritis (HCC)    Status post dilation of esophageal narrowing 2014   Wears glasses    Medical/Surgical History Narrative:  Allergic/Intolerant to:  Allergies  Allergen Reactions   Cox-2 Inhibitors Nausea And Vomiting    Severe abd pain   Leflunomide Other (See Comments)    Other reaction(s): hairloss   Methotrexate     Other  reaction(s): hairloss   Rosuvastatin  Diarrhea   Aspirin Nausea Only    Other reaction(s): GI upset   Glucose Diarrhea    cramps   Lactose Intolerance (Gi) Diarrhea    Stomach cramps   Oxycodone -Acetaminophen      Other reaction(s): Unknown   Sulfa Antibiotics Other (See Comments)    Unknown as child   Past Surgical History:  Procedure Laterality Date  CARDIOVERSION N/A 05/11/2019   Procedure: CARDIOVERSION;  Surgeon: Lonni Slain, MD;  Location: [facility] ENDOSCOPY;  Service: Cardiovascular;  Laterality: N/A;   CATARACT EXTRACTION  06/30/2016 -right eye   left eye in 08/15/16   COLONOSCOPY     DILATION AND CURETTAGE OF UTERUS  2009   ESOPHAGEAL DILATION  05/31/1997   REPAIR EXTENSOR TENDON Right 05/14/2013   Procedure: LONG FINGER EXTENSOR TO ELEVATE RING AND SMALL FINGERS ;  Surgeon: Lamar LULLA Leonor Mickey., MD;  Location: [facility];  Service: Orthopedics;  Laterality: Right;   seborrheic keratosis  09/03/2012   inflamed, excised from forehead   TENDON TRANSFER Right 09/03/2013   Procedure: RIGHT HAND FLEXOR CARPI RADIALUS TRANSFER TO LONG/RING/SMALL;  Surgeon: Lamar LULLA Leonor Mickey., MD;  Location: [facility];  Service: Orthopedics;  Laterality: Right;   TONSILLECTOMY     TOOTH EXTRACTION  05/2022   TUBAL LIGATION     ULNAR HEAD EXCISION Right 05/14/2013   Procedure: ARTHOPLASTY DISTAL RADIAL ULNAR JOINT, RIGHT ;  Surgeon: Lamar LULLA Leonor Mickey., MD;  Location: [facility];  Service: Orthopedics;  Laterality: Right;   UPPER GASTROINTESTINAL ENDOSCOPY     Family History  Problem Relation Age of Onset   Heart disease Mother    Osteoporosis Mother    COPD Father    Osteoporosis Maternal Grandmother    Colon cancer Paternal Uncle    Osteoporosis Maternal Aunt    Esophageal cancer Neg Hx    Pancreatic cancer Neg Hx    Rectal cancer Neg Hx    Stomach cancer Neg Hx    Family History Narrative: Father died at age 8 with complications of  COPD. Mother died at age 27 of heart failure. No brothers or sisters.  Social History   Social History Narrative   Son in [place]   Widowed 05/19/2022-moved to [facility] 05/2022  She has a 4-year college degree. 1 adult son who lives in [place]. She married  Prentice Mare, well-known clinical research associate and poet.He is now deceased. She lives alone at [facility]. Social alcohol consumption. Does not smoke.  Most Recent Health Risks Assessment: performed today  Most Recent Social Determinants of Health (Including Hx of Tobacco, Alcohol, and Drug Use) SDOH Screenings   Food Insecurity: No Food Insecurity (04/09/2024)  Housing: Low Risk (04/09/2024)  Transportation Needs: No Transportation Needs (04/09/2024)  Utilities: Not At Risk (04/09/2024)  Alcohol Screen: Low Risk (04/09/2024)  Depression (PHQ2-9): Medium Risk (04/09/2024)  Financial Resource Strain: Low Risk (04/02/2023)  Physical Activity: Sufficiently Active (04/09/2024)  Social Connections: Socially Isolated (04/09/2024)  Stress: Stress Concern Present (04/09/2024)  Tobacco Use: Low Risk (04/09/2024)  Health Literacy: Adequate Health Literacy (04/09/2024)   Social History   Tobacco Use   Smoking status: Never   Smokeless tobacco: Never  Vaping Use   Vaping status: Never Used  Substance Use Topics   Alcohol use: Yes    Alcohol/week: 21.0 standard drinks of alcohol    Types: 21 Standard drinks or equivalent per week    Comment: 3 times weekly   Drug use: No    Most Recent Fall Risk Assessment:    04/09/2024    2:12 PM  Fall Risk   Falls in the past year? 0  Number falls in past yr: 0  Injury with Fall? 0  Risk for fall due to : No Fall Risks  Follow up Falls prevention discussed;Education provided;Falls evaluation completed   Most Recent Anxiety/Depression Screenings:    04/09/2024  2:22 PM 04/02/2023    2:11 PM  PHQ 2/9 Scores  PHQ - 2 Score 2 2  PHQ- 9 Score 8 2      Data saved with a previous flowsheet row  definition      04/09/2024    2:22 PM  GAD 7 : Generalized Anxiety Score  Nervous, Anxious, on Edge 0  Control/stop worrying 0  Worry too much - different things 0  Trouble relaxing 0  Restless 0  Easily annoyed or irritable 0  Afraid - awful might happen 0  Total GAD 7 Score 0  Anxiety Difficulty Not difficult at all   Most Recent Cognitive Screening:    04/09/2024    2:12 PM  6CIT Screen  What Year? 0 points  What month? 0 points  What time? 0 points  Count back from 20 0 points  Months in reverse 0 points  Repeat phrase 0 points  Total Score 0 points   Most Recent Vision/Hearing Screenings:Vision Screening - Comments:: Patient states she is up to date with her eye exam at [facility].  Results:  Studies Obtained And Personally Reviewed By Me:  Labs:  CBC w/ Differential Lab Results  Component Value Date   WBC 5.4 04/07/2024   RBC 3.96 04/07/2024   HGB 13.3 04/07/2024   HCT 39.5 04/07/2024   PLT 202 04/07/2024   MCV 99.7 04/07/2024   MCH 33.6 (H) 04/07/2024   MCHC 33.7 04/07/2024   RDW 12.7 04/07/2024   MPV 9.5 04/07/2024   LYMPHSABS 2,858 03/26/2022   MONOABS 414 08/03/2016   BASOSABS 59 04/07/2024    Comprehensive Metabolic Panel Lab Results  Component Value Date   NA 140 04/07/2024   K 4.7 04/07/2024   CL 105 04/07/2024   CO2 28 04/07/2024   GLUCOSE 89 04/07/2024   BUN 17 04/07/2024   CREATININE 0.73 04/07/2024   CALCIUM  9.1 04/07/2024   PROT 6.7 04/07/2024   ALBUMIN 4.0 07/10/2023   AST 19 04/07/2024   ALT 15 04/07/2024   ALKPHOS 48 07/10/2023   BILITOT 0.6 04/07/2024   GFR 70.80 07/10/2023   EGFR 81 04/07/2024   GFRNONAA 79 03/15/2020   Lipid Panel  Lab Results  Component Value Date   CHOL 198 04/07/2024   HDL 111 04/07/2024   LDLCALC 74 04/07/2024   TRIG 54 04/07/2024    TSH Lab Results  Component Value Date   TSH 1.36 04/07/2024    Assessment & Plan:   Hypertension: treated with metoprolol  succinate 25 mg  daily. Blood pressure normal in-office today at 110/60.   Hyperlipidemia: treated with atorvastatin  20 mg three times weekly. Lipid Panel normal.   GE Reflux: treated with esomeprazole  40 mg daily.   Permanent atrial fibrillation: and is on Eliquis . 04/30/2019 TTE shows normal LV systolic function, severe left atrial dilatation. Zio patch x3 days on 12/02/2019 showed 100% AF burden with average rate 78 bpm.    IBS-D, lymphocytic colitis: with patchy collagen deposition, suspected Celiac, lactose intolerance. Seen by Dr. Avram and is on Lomotil , Nexium . Last budesonide  treatment 2020.   Rheumatoid Arthritis: treated with IV Remicade  by [facility].  History of hearing loss and tinnitus.  History of mild osteopenia based on bone density study.    History of environmental allergies.    10/08/2022 Mammogram no mammographic evidence of malignancy. Repeat in one year.    02/23/2022 Bone density Left 1/3 radius BMD 0.608 Tscore -1.40.    09/20/2016 Colonoscopy Diverticulosis in the sigmoid colon.  The examination was otherwise normal on direct and retroflexion views. Biopsies were taken with a cold forceps from the right colon, left colon and rectum for evaluation of microscopic colitis.     Annual Wellness Visit done today including the all of the following: Reviewed patient's Family Medical History Reviewed patient's SDOH and reviewed tobacco, alcohol, and drug use.  Reviewed and updated list of patient's medical providers Assessment of cognitive impairment was done Assessed patient's functional ability Established a written schedule for health screening services Health Risk Assessent Completed and Reviewed  Discussed health benefits of physical activity, and encouraged her to engage in regular exercise appropriate for her age and condition.    I,Makayla C Reid,acting as a scribe for Ronal JINNY Hailstone, MD.,have documented all relevant documentation on the behalf of Ronal JINNY Hailstone, MD,as directed by  Ronal JINNY Hailstone, MD while in the presence of Ronal JINNY Hailstone, MD.  I, Ronal JINNY Hailstone, MD, have reviewed all documentation for and agree with the above Annual Wellness Visit documentation.  Ronal JINNY Hailstone, MD Internal Medicine 04/09/2024 "

## 2024-04-09 NOTE — Progress Notes (Signed)
 Chief Complaint  Patient presents with   Annual Exam   Medicare Wellness     Subjective:   [name] is a 83 y.o. female who presents for a Medicare Annual Wellness Visit.  Visit info / Clinical Intake: Medicare Wellness Visit Type:: Subsequent Annual Wellness Visit Persons participating in visit and providing information:: patient Medicare Wellness Visit Mode:: In-person (required for WTM) Interpreter Needed?: No Pre-visit prep was completed: yes AWV questionnaire completed by patient prior to visit?: yes Date:: 04/07/24 Living arrangements:: (!) in retirement community; lives alone Patient's Overall Health Status Rating: good Typical amount of pain: some Does pain affect daily life?: no Are you currently prescribed opioids?: no  Dietary Habits and Nutritional Risks How many meals a day?: 2 Eats fruit and vegetables daily?: yes Most meals are obtained by: having others provide food In the last 2 weeks, have you had any of the following?: none Diabetic:: no  Functional Status Activities of Daily Living (to include ambulation/medication): Independent Ambulation: Independent Medication Administration: Independent Home Management (perform basic housework or laundry): Needs assistance (comment) Manage your own finances?: yes Primary transportation is: driving Concerns about vision?: no *vision screening is required for WTM* Concerns about hearing?: (!) yes Uses hearing aids?: no Hear whispered voice?: (!) no *in-person visit only*  Fall Screening Falls in the past year?: 0 Number of falls in past year: 0 Was there an injury with Fall?: 0 Fall Risk Category Calculator: 0 Patient Fall Risk Level: Low Fall Risk  Fall Risk Patient at Risk for Falls Due to: No Fall Risks Fall risk Follow up: Falls prevention discussed; Education provided; Falls evaluation completed  Home and Transportation Safety: All rugs have non-skid backing?: yes All stairs or steps have  railings?: yes Grab bars in the bathtub or shower?: yes Have non-skid surface in bathtub or shower?: yes Good home lighting?: yes Regular seat belt use?: yes Hospital stays in the last year:: no  Cognitive Assessment Difficulty concentrating, remembering, or making decisions? : yes Will 6CIT or Mini Cog be Completed: yes What year is it?: 0 points What month is it?: 0 points About what time is it?: 0 points Count backwards from 20 to 1: 0 points Say the months of the year in reverse: 0 points Repeat the address phrase from earlier: 0 points 6 CIT Score: 0 points  Advance Directives (For Healthcare) Does Patient Have a Medical Advance Directive?: Yes Does patient want to make changes to medical advance directive?: No - Patient declined Type of Advance Directive: Living will; Healthcare Power of Attorney Copy of Healthcare Power of Attorney in Chart?: No - copy requested Copy of Living Will in Chart?: No - copy requested  Reviewed/Updated  Reviewed/Updated: Reviewed All (Medical, Surgical, Family, Medications, Allergies, Care Teams, Patient Goals)    Allergies (verified) Cox-2 inhibitors, Leflunomide, Methotrexate, Rosuvastatin , Aspirin, Glucose, Lactose intolerance (gi), Oxycodone -acetaminophen , and Sulfa antibiotics   Current Medications (verified) Outpatient Encounter Medications as of 04/09/2024  Medication Sig   acetaminophen  (TYLENOL ) 500 MG tablet Take 500-1,000 mg by mouth every 8 (eight) hours as needed for moderate pain or headache.    atorvastatin  (LIPITOR) 20 MG tablet TAKE 1 TABLET BY MOUTH 3 TIMES A WEEK.   calcium  carbonate (TUMS EX) 750 MG chewable tablet Chew 1,500 mg by mouth as needed.   Clobetasol  Propionate 0.05 % shampoo Apply topically 3 (three) times a week.   diphenhydrAMINE (BENADRYL) 25 MG tablet Take 25 mg by mouth as needed.   diphenoxylate -atropine  (LOMOTIL ) 2.5-0.025 MG  tablet TAKE 2 TABLETS BY MOUTH 4 TIMES DAILY AS NEEDED FOR DIARRHEA OR LOOSE  STOOLS MAXIMUM 8 TABLETS DAILY   ELIQUIS  2.5 MG TABS tablet TAKE 1 TABLET BY MOUTH TWICE A DAY   esomeprazole  (NEXIUM ) 40 MG capsule TAKE 1 CAPSULE BY MOUTH EVERY DAY BEFORE BREAKFAST   hydrocortisone  cream 1 % Apply 1 Application topically daily as needed for itching (burns).   inFLIXimab  (REMICADE ) 100 MG injection Inject 10 mg/kg into the vein every 8 (eight) weeks.   Ivermectin 1 % CREA 1 application to affected area   lactase (LACTAID) 3000 units tablet 1 tablet   loratadine (CLARITIN) 10 MG tablet    metoprolol  succinate (TOPROL -XL) 25 MG 24 hr tablet TAKE 1 TABLET (25 MG TOTAL) BY MOUTH DAILY.   minoxidil (ROGAINE) 2 % external solution Apply 1 application topically 2 (two) times daily.   Multiple Vitamin (MULTIVITAMIN) tablet Take 1 tablet by mouth daily.   neomycin-bacitracin-polymyxin (NEOSPORIN) ointment Apply 1 application topically as needed for wound care.   nystatin -triamcinolone  ointment (MYCOLOG) APPLY TO AFFECTED AREA TWICE A DAY   No facility-administered encounter medications on file as of 04/09/2024.    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   Rheumatoid arthritis (HCC)    Status post dilation of esophageal narrowing 2014   Wears glasses    Past Surgical History:  Procedure Laterality Date   CARDIOVERSION N/A 05/11/2019   Procedure: CARDIOVERSION;  Surgeon: Lonni Slain, MD;  Location: [facility] END[facility];  Service: Cardiovascular;  Laterality: N/A;   CATARACT EXTRACTION  06/30/2016 -right eye   left eye in 08/15/16   COLONOSCOPY     DILATION AND CURETTAGE OF UTERUS  2009   ESOPHAGEAL DILATION  05/31/1997   REPAIR  EXTENSOR TENDON Right 05/14/2013   Procedure: LONG FINGER EXTENSOR TO ELEVATE RING AND SMALL FINGERS ;  Surgeon: Lamar LULLA Leonor Mickey., MD;  Location: [facility];  Service: Orthopedics;  Laterality: Right;   seborrheic keratosis  09/03/2012   inflamed, excised from forehead   TENDON TRANSFER Right 09/03/2013   Procedure: RIGHT HAND FLEXOR CARPI RADIALUS TRANSFER TO LONG/RING/SMALL;  Surgeon: Lamar LULLA Leonor Mickey., MD;  Location: [facility];  Service: Orthopedics;  Laterality: Right;   TONSILLECTOMY     TOOTH EXTRACTION  05/2022   TUBAL LIGATION     ULNAR HEAD EXCISION Right 05/14/2013   Procedure: ARTHOPLASTY DISTAL RADIAL ULNAR JOINT, RIGHT ;  Surgeon: Lamar LULLA Leonor Mickey., MD;  Location: [facility];  Service: Orthopedics;  Laterality: Right;   UPPER GASTROINTESTINAL ENDOSCOPY     Family History  Problem Relation Age of Onset   Heart disease Mother    Osteoporosis Mother    COPD Father    Osteoporosis Maternal Grandmother    Colon cancer Paternal Uncle    Osteoporosis Maternal Aunt    Esophageal cancer Neg Hx    Pancreatic cancer Neg Hx    Rectal cancer Neg Hx    Stomach cancer Neg Hx    Social History   Occupational History   Occupation: Retired- Dentist: RETIRED  Tobacco Use  Smoking status: Never   Smokeless tobacco: Never  Vaping Use   Vaping status: Never Used  Substance and Sexual Activity   Alcohol use: Yes    Alcohol/week: 21.0 standard drinks of alcohol    Types: 21 Standard drinks or equivalent per week    Comment: 3 times weekly   Drug use: No   Sexual activity: Not on file   Tobacco Counseling Counseling given: Not Answered  SDOH Screenings   Food Insecurity: No Food Insecurity (04/09/2024)  Housing: Low Risk (04/09/2024)  Transportation Needs: No Transportation Needs (04/09/2024)  Utilities: Not At Risk (04/09/2024)  Alcohol Screen: Low Risk (04/09/2024)  Depression (PHQ2-9): Medium Risk  (04/09/2024)  Financial Resource Strain: Low Risk (04/02/2023)  Physical Activity: Sufficiently Active (04/09/2024)  Social Connections: Socially Isolated (04/09/2024)  Stress: Stress Concern Present (04/09/2024)  Tobacco Use: Low Risk (04/09/2024)  Health Literacy: Adequate Health Literacy (04/09/2024)   See flowsheets for full screening details  Depression Screen PHQ 2 & 9 Depression Scale- Over the past 2 weeks, how often have you been bothered by any of the following problems? Little interest or pleasure in doing things: 0 Feeling down, depressed, or hopeless (PHQ Adolescent also includes...irritable): 2 PHQ-2 Total Score: 2 Trouble falling or staying asleep, or sleeping too much: 0 Feeling tired or having little energy: 2 Poor appetite or overeating (PHQ Adolescent also includes...weight loss): 0 Feeling bad about yourself - or that you are a failure or have let yourself or your family down: 3 Trouble concentrating on things, such as reading the newspaper or watching television (PHQ Adolescent also includes...like school work): 1 Moving or speaking so slowly that other people could have noticed. Or the opposite - being so fidgety or restless that you have been moving around a lot more than usual: 0 Thoughts that you would be better off dead, or of hurting yourself in some way: 0 PHQ-9 Total Score: 8 If you checked off any problems, how difficult have these problems made it for you to do your work, take care of things at home, or get along with other people?: Not difficult at all     Goals Addressed               This Visit's Progress     Maintain or improve exercise capacity (6 minute walk) (pt-stated)               Reduce alcohol intake (pt-stated)               Objective:    Today's Vitals   04/09/24 1407  BP: 110/60  Pulse: 76  SpO2: 98%  Weight: 113 lb (51.3 kg)  Height: 5' 2 (1.575 m)  PainSc: 0-No pain   Body mass index is 20.67  kg/m.  Hearing/Vision screen Vision Screening - Comments:: Patient states she is up to date with her eye exam at [facility].  Immunizations and Health Maintenance Health Maintenance  Topic Date Due   Zoster Vaccines- Shingrix (1 of 2) 01/10/1960   COVID-19 Vaccine (6 - 2025-26 season) 12/30/2023   Medicare Annual Wellness (AWV)  04/01/2024   Mammogram  10/15/2024   LIPID PANEL  04/07/2025   DTaP/Tdap/Td (4 - Td or Tdap) 01/08/2033   Pneumococcal Vaccine: 50+ Years  Completed   Influenza Vaccine  Completed   Bone Density Scan  Completed   Meningococcal B Vaccine  Aged Out        Assessment/Plan:  This is a routine wellness examination for [name].  Patient Care Team: [name] PARAS, MD as PCP - General (Internal Medicine) Kate Lonni CROME, MD as PCP - Cardiology (Cardiology) Harvey Seltzer, MD (Family Medicine) Dyane Rush, MD (Inactive) (Gastroenterology) Rosan Credit, MD (Ophthalmology) Henry Slough, MD (Obstetrics and Gynecology) Dwane Lerner, MD (Dermatology) Carolee Acosta (Dental General Practice) Avram Lupita BRAVO, MD as Consulting Physician (Gastroenterology) Mai Lynwood FALCON, MD as Consulting Physician (Rheumatology)  I have personally reviewed and noted the following in the patients chart:   Medical and social history Use of alcohol, tobacco or illicit drugs  Current medications and supplements including opioid prescriptions. Functional ability and status Nutritional status Physical activity Advanced directives List of other physicians Hospitalizations, surgeries, and ER visits in previous 12 months Vitals Screenings to include cognitive, depression, and falls Referrals and appointments  No orders of the defined types were placed in this encounter.  In addition, I have reviewed and discussed with patient certain preventive protocols, quality metrics, and best practice recommendations. A written personalized care plan for preventive  services as well as general preventive health recommendations were provided to patient.   [name], CMA   04/09/2024   No follow-ups on file.  After Visit Summary: (In Person-Printed) AVS printed and given to the patient  Nurse Notes: none

## 2024-04-09 NOTE — Patient Instructions (Addendum)
 [name],  It was a pleasure as always to see you today. Continue cirrent medications and return in one year or as needed.  Thank you for taking the time for your Medicare Wellness Visit. I appreciate your continued commitment to your health goals. Please review the care plan we discussed, and feel free to reach out if I can assist you further.  Please note that Annual Wellness Visits do not include a physical exam. Some assessments may be limited, especially if the visit was conducted virtually. If needed, we may recommend an in-person follow-up with your provider.  Ongoing Care Seeing your primary care provider every 3 to 6 months helps us  monitor your health and provide consistent, personalized care.   Referrals If a referral was made during today's visit and you haven't received any updates within two weeks, please contact the referred provider directly to check on the status.  Recommended Screenings:  Health Maintenance  Topic Date Due   Zoster (Shingles) Vaccine (1 of 2) 01[birth year]10/1960   COVID-19 Vaccine (6 - 2025-26 season) 12/30/2023   Medicare Annual Wellness Visit  04/01/2024   Breast Cancer Screening  10/15/2024   Lipid (cholesterol) test  04/07/2025   DTaP/Tdap/Td vaccine (4 - Td or Tdap) 01/08/2033   Pneumococcal Vaccine for age over 41  Completed   Flu Shot  Completed   Osteoporosis screening with Bone Density Scan  Completed   Meningitis B Vaccine  Aged Out       04/09/2024    2:12 PM  Advanced Directives  Does Patient Have a Medical Advance Directive? Yes  Type of Advance Directive Living will;Healthcare Power of Attorney  Does patient want to make changes to medical advance directive? No - Patient declined  Copy of Healthcare Power of Attorney in Chart? No - copy requested    Vision: Annual vision screenings are recommended for early detection of glaucoma, cataracts, and diabetic retinopathy. These exams can also reveal signs of chronic conditions such as  diabetes and high blood pressure.  Dental: Annual dental screenings help detect early signs of oral cancer, gum disease, and other conditions linked to overall health, including heart disease and diabetes.  Please see the attached documents for additional preventive care recommendations.     Next appointment: Follow up in one year for your annual wellness visit    Preventive Care 65 Years and Older, Female Preventive care refers to lifestyle choices and visits with your health care provider that can promote health and wellness. What does preventive care include? A yearly physical exam. This is also called an annual well check. Dental exams once or twice a year. Routine eye exams. Ask your health care provider how often you should have your eyes checked. Personal lifestyle choices, including: Daily care of your teeth and gums. Regular physical activity. Eating a healthy diet. Avoiding tobacco and drug use. Limiting alcohol use. Practicing safe sex. Taking low-dose aspirin every day. Taking vitamin and mineral supplements as recommended by your health care provider. What happens during an annual well check? The services and screenings done by your health care provider during your annual well check will depend on your age, overall health, lifestyle risk factors, and family history of disease. Counseling  Your health care provider may ask you questions about your: Alcohol use. Tobacco use. Drug use. Emotional well-being. Home and relationship well-being. Sexual activity. Eating habits. History of falls. Memory and ability to understand (cognition). Work and work astronomer. Reproductive health. Screening  You may have the following  tests or measurements: Height, weight, and BMI. Blood pressure. Lipid and cholesterol levels. These may be checked every 5 years, or more frequently if you are over 62 years old. Skin check. Lung cancer screening. You may have this screening  every year starting at age 21 if you have a 30-pack-year history of smoking and currently smoke or have quit within the past 15 years. Fecal occult blood test (FOBT) of the stool. You may have this test every year starting at age 72. Flexible sigmoidoscopy or colonoscopy. You may have a sigmoidoscopy every 5 years or a colonoscopy every 10 years starting at age 17. Hepatitis C blood test. Hepatitis B blood test. Sexually transmitted disease (STD) testing. Diabetes screening. This is done by checking your blood sugar (glucose) after you have not eaten for a while (fasting). You may have this done every 1-3 years. Bone density scan. This is done to screen for osteoporosis. You may have this done starting at age 79. Mammogram. This may be done every 1-2 years. Talk to your health care provider about how often you should have regular mammograms. Talk with your health care provider about your test results, treatment options, and if necessary, the need for more tests. Vaccines  Your health care provider may recommend certain vaccines, such as: Influenza vaccine. This is recommended every year. Tetanus, diphtheria, and acellular pertussis (Tdap, Td) vaccine. You may need a Td booster every 10 years. Zoster vaccine. You may need this after age 52. Pneumococcal 13-valent conjugate (PCV13) vaccine. One dose is recommended after age 54. Pneumococcal polysaccharide (PPSV23) vaccine. One dose is recommended after age 50. Talk to your health care provider about which screenings and vaccines you need and how often you need them. This information is not intended to replace advice given to you by your health care provider. Make sure you discuss any questions you have with your health care provider. Document Released: 05/13/2015 Document Revised: 01/04/2016 Document Reviewed: 02/15/2015 Elsevier Interactive Patient Education  2017 [employer].  Fall Prevention in the Home Falls can cause injuries. They can  happen to people of all ages. There are many things you can do to make your home safe and to help prevent falls. What can I do on the outside of my home? Regularly fix the edges of walkways and driveways and fix any cracks. Remove anything that might make you trip as you walk through a door, such as a raised step or threshold. Trim any bushes or trees on the path to your home. Use bright outdoor lighting. Clear any walking paths of anything that might make someone trip, such as rocks or tools. Regularly check to see if handrails are loose or broken. Make sure that both sides of any steps have handrails. Any raised decks and porches should have guardrails on the edges. Have any leaves, snow, or ice cleared regularly. Use sand or salt on walking paths during winter. Clean up any spills in your garage right away. This includes oil or grease spills. What can I do in the bathroom? Use night lights. Install grab bars by the toilet and in the tub and shower. Do not use towel bars as grab bars. Use non-skid mats or decals in the tub or shower. If you need to sit down in the shower, use a plastic, non-slip stool. Keep the floor dry. Clean up any water that spills on the floor as soon as it happens. Remove soap buildup in the tub or shower regularly. Attach bath mats securely with  double-sided non-slip rug tape. Do not have throw rugs and other things on the floor that can make you trip. What can I do in the bedroom? Use night lights. Make sure that you have a light by your bed that is easy to reach. Do not use any sheets or blankets that are too big for your bed. They should not hang down onto the floor. Have a firm chair that has side arms. You can use this for support while you get dressed. Do not have throw rugs and other things on the floor that can make you trip. What can I do in the kitchen? Clean up any spills right away. Avoid walking on wet floors. Keep items that you use a lot in  easy-to-reach places. If you need to reach something above you, use a strong step stool that has a grab bar. Keep electrical cords out of the way. Do not use floor polish or wax that makes floors slippery. If you must use wax, use non-skid floor wax. Do not have throw rugs and other things on the floor that can make you trip. What can I do with my stairs? Do not leave any items on the stairs. Make sure that there are handrails on both sides of the stairs and use them. Fix handrails that are broken or loose. Make sure that handrails are as long as the stairways. Check any carpeting to make sure that it is firmly attached to the stairs. Fix any carpet that is loose or worn. Avoid having throw rugs at the top or bottom of the stairs. If you do have throw rugs, attach them to the floor with carpet tape. Make sure that you have a light switch at the top of the stairs and the bottom of the stairs. If you do not have them, ask someone to add them for you. What else can I do to help prevent falls? Wear shoes that: Do not have high heels. Have rubber bottoms. Are comfortable and fit you well. Are closed at the toe. Do not wear sandals. If you use a stepladder: Make sure that it is fully opened. Do not climb a closed stepladder. Make sure that both sides of the stepladder are locked into place. Ask someone to hold it for you, if possible. Clearly mark and make sure that you can see: Any grab bars or handrails. First and last steps. Where the edge of each step is. Use tools that help you move around (mobility aids) if they are needed. These include: Canes. Walkers. Scooters. Crutches. Turn on the lights when you go into a dark area. Replace any light bulbs as soon as they burn out. Set up your furniture so you have a clear path. Avoid moving your furniture around. If any of your floors are uneven, fix them. If there are any pets around you, be aware of where they are. Review your medicines  with your doctor. Some medicines can make you feel dizzy. This can increase your chance of falling. Ask your doctor what other things that you can do to help prevent falls. This information is not intended to replace advice given to you by your health care provider. Make sure you discuss any questions you have with your health care provider. Document Released: 02/10/2009 Document Revised: 09/22/2015 Document Reviewed: 05/21/2014 Elsevier Interactive Patient Education  2017 [employer].

## 2024-04-10 ENCOUNTER — Telehealth: Payer: Self-pay | Admitting: Internal Medicine

## 2024-04-10 ENCOUNTER — Other Ambulatory Visit

## 2024-04-10 NOTE — Telephone Encounter (Signed)
 [name] has been having issues with diarrhea for 3-4 weeks. She said she had loosened her diet but is getting back on track. She eats 2 meals a day. She is using the generic Lomotil   2 tablets every 6 hours as needed. Today she started herself back on the 3mg  Budesonide  tablets. She took 3 this AM. She has 4 days worth and is requesting a refill be sent in to [facility] as it seeems to be helping. Will route to Dr Avram to advise.

## 2024-04-10 NOTE — Telephone Encounter (Signed)
 Inbound call from patient stating that she has been having trouble with Diarrhea and would like to have get a refill on her Budensonide 3 MG tablets. Patient stated she would like a return call from [facility]. Please advise.

## 2024-04-13 MED ORDER — BUDESONIDE 3 MG PO CPEP: 9.0000 mg | ORAL_CAPSULE | ORAL | 0 refills | Status: AC

## 2024-04-13 NOTE — Telephone Encounter (Signed)
 [name] has been informed and said thank you.

## 2024-04-13 NOTE — Telephone Encounter (Signed)
 Prescription sent to her pharmacy

## 2024-04-14 NOTE — Telephone Encounter (Signed)
 done

## 2024-04-26 ENCOUNTER — Encounter: Payer: Self-pay | Admitting: Internal Medicine

## 2024-05-13 ENCOUNTER — Ambulatory Visit: Admitting: Podiatry

## 2024-05-28 ENCOUNTER — Other Ambulatory Visit: Payer: Self-pay

## 2024-05-28 DIAGNOSIS — I4819 Other persistent atrial fibrillation: Secondary | ICD-10-CM

## 2024-05-28 MED ORDER — APIXABAN 2.5 MG PO TABS: 2.5000 mg | ORAL_TABLET | Freq: Two times a day (BID) | ORAL | 1 refills | Status: AC

## 2024-08-12 ENCOUNTER — Ambulatory Visit: Admitting: Podiatry
